# Patient Record
Sex: Male | Born: 1935 | Race: Black or African American | Hispanic: No | Marital: Married | Smoking: Former smoker
Health system: Southern US, Community
[De-identification: ages and names within clinical notes are randomized; demographics above are authoritative.]

## PROBLEM LIST (undated history)

## (undated) DIAGNOSIS — I10 Essential (primary) hypertension: Secondary | ICD-10-CM

## (undated) DIAGNOSIS — E7800 Pure hypercholesterolemia, unspecified: Secondary | ICD-10-CM

## (undated) DIAGNOSIS — I4891 Unspecified atrial fibrillation: Secondary | ICD-10-CM

## (undated) DIAGNOSIS — N400 Enlarged prostate without lower urinary tract symptoms: Secondary | ICD-10-CM

## (undated) DIAGNOSIS — M1990 Unspecified osteoarthritis, unspecified site: Secondary | ICD-10-CM

## (undated) DIAGNOSIS — I350 Nonrheumatic aortic (valve) stenosis: Secondary | ICD-10-CM

## (undated) DIAGNOSIS — I6350 Cerebral infarction due to unspecified occlusion or stenosis of unspecified cerebral artery: Secondary | ICD-10-CM

## (undated) DIAGNOSIS — I509 Heart failure, unspecified: Secondary | ICD-10-CM

## (undated) DIAGNOSIS — I281 Aneurysm of pulmonary artery: Secondary | ICD-10-CM

## (undated) DIAGNOSIS — N183 Chronic kidney disease, stage 3 unspecified: Secondary | ICD-10-CM

## (undated) DIAGNOSIS — M109 Gout, unspecified: Secondary | ICD-10-CM

## (undated) DIAGNOSIS — I272 Pulmonary hypertension, unspecified: Secondary | ICD-10-CM

## (undated) DIAGNOSIS — I359 Nonrheumatic aortic valve disorder, unspecified: Secondary | ICD-10-CM

## (undated) DIAGNOSIS — K649 Unspecified hemorrhoids: Secondary | ICD-10-CM

## (undated) DIAGNOSIS — M159 Polyosteoarthritis, unspecified: Secondary | ICD-10-CM

## (undated) DIAGNOSIS — I639 Cerebral infarction, unspecified: Secondary | ICD-10-CM

## (undated) DIAGNOSIS — N289 Disorder of kidney and ureter, unspecified: Secondary | ICD-10-CM

## (undated) HISTORY — DX: Aneurysm of pulmonary artery: I28.1

## (undated) HISTORY — DX: Pure hypercholesterolemia, unspecified: E78.00

## (undated) HISTORY — DX: Heart failure, unspecified: I50.9

## (undated) HISTORY — DX: Unspecified hemorrhoids: K64.9

## (undated) HISTORY — DX: Unspecified osteoarthritis, unspecified site: M19.90

## (undated) HISTORY — DX: Unspecified atrial fibrillation: I48.91

## (undated) HISTORY — DX: Disorder of kidney and ureter, unspecified: N28.9

## (undated) HISTORY — DX: Polyosteoarthritis, unspecified: M15.9

## (undated) HISTORY — PX: APPENDECTOMY: SHX54

## (undated) HISTORY — DX: Essential (primary) hypertension: I10

## (undated) HISTORY — DX: Cerebral infarction due to unspecified occlusion or stenosis of unspecified cerebral artery: I63.50

## (undated) HISTORY — DX: Cerebral infarction, unspecified: I63.9

## (undated) HISTORY — PX: INGUINAL HERNIA REPAIR: SUR1180

## (undated) HISTORY — PX: CATARACT EXTRACTION: SUR2

## (undated) HISTORY — DX: Benign prostatic hyperplasia without lower urinary tract symptoms: N40.0

## (undated) HISTORY — DX: Nonrheumatic aortic (valve) stenosis: I35.0

## (undated) HISTORY — DX: Pulmonary hypertension, unspecified: I27.20

## (undated) HISTORY — DX: Gout, unspecified: M10.9

## (undated) HISTORY — PX: OTHER SURGICAL HISTORY: SHX169

---

## 1998-01-19 ENCOUNTER — Ambulatory Visit (HOSPITAL_BASED_OUTPATIENT_CLINIC_OR_DEPARTMENT_OTHER): Admission: RE | Admit: 1998-01-19 | Discharge: 1998-01-19 | Payer: Self-pay | Admitting: Surgery

## 1998-06-17 ENCOUNTER — Ambulatory Visit (HOSPITAL_BASED_OUTPATIENT_CLINIC_OR_DEPARTMENT_OTHER): Admission: RE | Admit: 1998-06-17 | Discharge: 1998-06-17 | Payer: Self-pay | Admitting: Urology

## 1999-01-10 ENCOUNTER — Inpatient Hospital Stay (HOSPITAL_COMMUNITY): Admission: EM | Admit: 1999-01-10 | Discharge: 1999-01-13 | Payer: Self-pay | Admitting: Pulmonary Disease

## 1999-01-10 ENCOUNTER — Encounter: Payer: Self-pay | Admitting: Emergency Medicine

## 1999-01-10 ENCOUNTER — Encounter: Payer: Self-pay | Admitting: Pulmonary Disease

## 2000-03-01 ENCOUNTER — Encounter: Payer: Self-pay | Admitting: Cardiothoracic Surgery

## 2000-03-01 ENCOUNTER — Encounter: Admission: RE | Admit: 2000-03-01 | Discharge: 2000-03-01 | Payer: Self-pay | Admitting: Cardiothoracic Surgery

## 2001-02-21 ENCOUNTER — Encounter: Payer: Self-pay | Admitting: Cardiothoracic Surgery

## 2001-02-21 ENCOUNTER — Encounter: Admission: RE | Admit: 2001-02-21 | Discharge: 2001-02-21 | Payer: Self-pay | Admitting: Cardiothoracic Surgery

## 2002-03-06 ENCOUNTER — Encounter: Payer: Self-pay | Admitting: Cardiothoracic Surgery

## 2002-03-06 ENCOUNTER — Encounter: Admission: RE | Admit: 2002-03-06 | Discharge: 2002-03-06 | Payer: Self-pay | Admitting: Cardiothoracic Surgery

## 2002-11-17 ENCOUNTER — Encounter: Payer: Self-pay | Admitting: Cardiology

## 2002-11-17 ENCOUNTER — Encounter: Admission: RE | Admit: 2002-11-17 | Discharge: 2002-11-17 | Payer: Self-pay | Admitting: Cardiology

## 2003-10-25 ENCOUNTER — Inpatient Hospital Stay (HOSPITAL_COMMUNITY): Admission: EM | Admit: 2003-10-25 | Discharge: 2003-10-27 | Payer: Self-pay | Admitting: Emergency Medicine

## 2003-10-26 ENCOUNTER — Encounter: Payer: Self-pay | Admitting: Cardiology

## 2003-12-19 ENCOUNTER — Inpatient Hospital Stay (HOSPITAL_COMMUNITY): Admission: EM | Admit: 2003-12-19 | Discharge: 2003-12-25 | Payer: Self-pay | Admitting: Emergency Medicine

## 2003-12-21 ENCOUNTER — Encounter: Payer: Self-pay | Admitting: Internal Medicine

## 2004-06-10 ENCOUNTER — Ambulatory Visit: Payer: Self-pay | Admitting: Cardiovascular Disease

## 2004-07-14 ENCOUNTER — Ambulatory Visit: Payer: Self-pay | Admitting: Cardiology

## 2004-08-11 ENCOUNTER — Ambulatory Visit: Payer: Self-pay | Admitting: Internal Medicine

## 2004-08-31 ENCOUNTER — Ambulatory Visit: Payer: Self-pay | Admitting: Pulmonary Disease

## 2004-09-08 ENCOUNTER — Ambulatory Visit: Payer: Self-pay | Admitting: Cardiology

## 2004-10-06 ENCOUNTER — Ambulatory Visit: Payer: Self-pay | Admitting: Cardiology

## 2004-10-12 ENCOUNTER — Ambulatory Visit: Payer: Self-pay | Admitting: Pulmonary Disease

## 2004-10-17 ENCOUNTER — Ambulatory Visit: Payer: Self-pay | Admitting: Pulmonary Disease

## 2004-11-03 ENCOUNTER — Ambulatory Visit: Payer: Self-pay | Admitting: Internal Medicine

## 2004-11-15 ENCOUNTER — Ambulatory Visit: Payer: Self-pay | Admitting: Cardiology

## 2004-12-13 ENCOUNTER — Ambulatory Visit: Payer: Self-pay | Admitting: Cardiology

## 2005-01-10 ENCOUNTER — Ambulatory Visit: Payer: Self-pay | Admitting: *Deleted

## 2005-01-31 ENCOUNTER — Ambulatory Visit: Payer: Self-pay | Admitting: Cardiology

## 2005-03-08 ENCOUNTER — Ambulatory Visit: Payer: Self-pay | Admitting: *Deleted

## 2005-04-07 ENCOUNTER — Ambulatory Visit: Payer: Self-pay | Admitting: Cardiology

## 2005-05-05 ENCOUNTER — Ambulatory Visit: Payer: Self-pay | Admitting: Cardiology

## 2005-05-26 ENCOUNTER — Ambulatory Visit: Payer: Self-pay | Admitting: Pulmonary Disease

## 2005-06-06 ENCOUNTER — Ambulatory Visit: Payer: Self-pay | Admitting: Cardiology

## 2005-06-19 ENCOUNTER — Ambulatory Visit: Payer: Self-pay | Admitting: Cardiology

## 2005-06-22 ENCOUNTER — Ambulatory Visit: Payer: Self-pay

## 2005-06-22 ENCOUNTER — Encounter: Payer: Self-pay | Admitting: Cardiology

## 2005-07-04 ENCOUNTER — Ambulatory Visit: Payer: Self-pay | Admitting: *Deleted

## 2005-07-07 ENCOUNTER — Ambulatory Visit: Payer: Self-pay | Admitting: Pulmonary Disease

## 2005-07-19 ENCOUNTER — Ambulatory Visit: Payer: Self-pay | Admitting: Cardiology

## 2005-08-16 ENCOUNTER — Ambulatory Visit: Payer: Self-pay | Admitting: *Deleted

## 2005-09-06 ENCOUNTER — Ambulatory Visit: Payer: Self-pay | Admitting: Cardiology

## 2005-10-06 ENCOUNTER — Ambulatory Visit: Payer: Self-pay | Admitting: Cardiology

## 2005-10-17 ENCOUNTER — Ambulatory Visit: Payer: Self-pay | Admitting: Pulmonary Disease

## 2005-10-20 ENCOUNTER — Ambulatory Visit: Payer: Self-pay | Admitting: Cardiovascular Disease

## 2005-11-03 ENCOUNTER — Ambulatory Visit: Payer: Self-pay | Admitting: Pulmonary Disease

## 2005-11-09 ENCOUNTER — Ambulatory Visit: Payer: Self-pay | Admitting: Pulmonary Disease

## 2005-11-15 ENCOUNTER — Ambulatory Visit: Payer: Self-pay | Admitting: *Deleted

## 2005-12-20 ENCOUNTER — Ambulatory Visit: Payer: Self-pay | Admitting: Cardiology

## 2006-01-25 ENCOUNTER — Ambulatory Visit: Payer: Self-pay | Admitting: Cardiology

## 2006-04-02 ENCOUNTER — Ambulatory Visit: Payer: Self-pay | Admitting: Cardiology

## 2006-05-07 ENCOUNTER — Ambulatory Visit: Payer: Self-pay | Admitting: Pulmonary Disease

## 2006-05-10 ENCOUNTER — Ambulatory Visit: Payer: Self-pay | Admitting: Cardiology

## 2006-08-13 ENCOUNTER — Ambulatory Visit: Payer: Self-pay | Admitting: Cardiology

## 2006-08-27 ENCOUNTER — Encounter: Payer: Self-pay | Admitting: Cardiology

## 2006-08-27 ENCOUNTER — Ambulatory Visit: Payer: Self-pay

## 2006-08-27 ENCOUNTER — Ambulatory Visit: Payer: Self-pay | Admitting: Internal Medicine

## 2006-09-17 ENCOUNTER — Ambulatory Visit: Payer: Self-pay | Admitting: Cardiology

## 2006-10-09 ENCOUNTER — Ambulatory Visit: Payer: Self-pay | Admitting: Cardiology

## 2006-10-30 ENCOUNTER — Ambulatory Visit: Payer: Self-pay | Admitting: Cardiology

## 2006-11-17 ENCOUNTER — Emergency Department (HOSPITAL_COMMUNITY): Admission: EM | Admit: 2006-11-17 | Discharge: 2006-11-17 | Payer: Self-pay | Admitting: Emergency Medicine

## 2006-11-20 ENCOUNTER — Ambulatory Visit: Payer: Self-pay | Admitting: Pulmonary Disease

## 2006-11-20 ENCOUNTER — Ambulatory Visit: Payer: Self-pay | Admitting: *Deleted

## 2006-11-21 ENCOUNTER — Ambulatory Visit: Payer: Self-pay | Admitting: Pulmonary Disease

## 2006-11-21 LAB — CONVERTED CEMR LAB
ALT: 15 units/L (ref 0–40)
AST: 29 units/L (ref 0–37)
Albumin: 3.6 g/dL (ref 3.5–5.2)
Bilirubin, Direct: 0.3 mg/dL (ref 0.0–0.3)
Chloride: 105 meq/L (ref 96–112)
Creatinine, Ser: 1.1 mg/dL (ref 0.4–1.5)
Eosinophils Relative: 3.2 % (ref 0.0–5.0)
Glucose, Bld: 108 mg/dL — ABNORMAL HIGH (ref 70–99)
HCT: 35.4 % — ABNORMAL LOW (ref 39.0–52.0)
Neutrophils Relative %: 72.9 % (ref 43.0–77.0)
Pro B Natriuretic peptide (BNP): 864 pg/mL — ABNORMAL HIGH (ref 0.0–100.0)
RBC: 3.96 M/uL — ABNORMAL LOW (ref 4.22–5.81)
RDW: 16.7 % — ABNORMAL HIGH (ref 11.5–14.6)
Sodium: 145 meq/L (ref 135–145)
Total Bilirubin: 1.2 mg/dL (ref 0.3–1.2)
WBC: 6.2 10*3/uL (ref 4.5–10.5)

## 2006-12-11 ENCOUNTER — Ambulatory Visit: Payer: Self-pay | Admitting: Cardiology

## 2006-12-24 ENCOUNTER — Ambulatory Visit: Payer: Self-pay | Admitting: Pulmonary Disease

## 2007-02-01 ENCOUNTER — Ambulatory Visit: Payer: Self-pay | Admitting: Cardiology

## 2007-02-20 ENCOUNTER — Ambulatory Visit: Payer: Self-pay | Admitting: Internal Medicine

## 2007-03-04 ENCOUNTER — Ambulatory Visit: Payer: Self-pay | Admitting: Pulmonary Disease

## 2007-03-04 LAB — CONVERTED CEMR LAB
Albumin: 3.8 g/dL (ref 3.5–5.2)
Alkaline Phosphatase: 136 units/L — ABNORMAL HIGH (ref 39–117)
BUN: 20 mg/dL (ref 6–23)
Basophils Absolute: 0 10*3/uL (ref 0.0–0.1)
Creatinine, Ser: 1.4 mg/dL (ref 0.4–1.5)
Eosinophils Absolute: 0.2 10*3/uL (ref 0.0–0.6)
GFR calc Af Amer: 63 mL/min
GFR calc non Af Amer: 52 mL/min
Hemoglobin: 11.7 g/dL — ABNORMAL LOW (ref 13.0–17.0)
MCHC: 33.4 g/dL (ref 30.0–36.0)
MCV: 90.2 fL (ref 78.0–100.0)
Monocytes Absolute: 0.6 10*3/uL (ref 0.2–0.7)
Monocytes Relative: 10.5 % (ref 3.0–11.0)
Potassium: 3.8 meq/L (ref 3.5–5.1)
Pro B Natriuretic peptide (BNP): 1017 pg/mL — ABNORMAL HIGH (ref 0.0–100.0)
RDW: 17.2 % — ABNORMAL HIGH (ref 11.5–14.6)
Sodium: 144 meq/L (ref 135–145)
Total Bilirubin: 1.5 mg/dL — ABNORMAL HIGH (ref 0.3–1.2)

## 2007-03-06 ENCOUNTER — Ambulatory Visit: Payer: Self-pay | Admitting: Cardiology

## 2007-03-19 ENCOUNTER — Ambulatory Visit: Payer: Self-pay | Admitting: Cardiology

## 2007-03-28 ENCOUNTER — Ambulatory Visit: Payer: Self-pay | Admitting: Internal Medicine

## 2007-03-28 ENCOUNTER — Ambulatory Visit: Payer: Self-pay | Admitting: Cardiology

## 2007-03-28 LAB — CONVERTED CEMR LAB
BUN: 53 mg/dL — ABNORMAL HIGH (ref 6–23)
CO2: 38 meq/L — ABNORMAL HIGH (ref 19–32)
Calcium: 8.7 mg/dL (ref 8.4–10.5)
Chloride: 94 meq/L — ABNORMAL LOW (ref 96–112)
Creatinine, Ser: 1.7 mg/dL — ABNORMAL HIGH (ref 0.4–1.5)
Pro B Natriuretic peptide (BNP): 1263 pg/mL — ABNORMAL HIGH (ref 0.0–100.0)

## 2007-03-29 ENCOUNTER — Emergency Department (HOSPITAL_COMMUNITY): Admission: EM | Admit: 2007-03-29 | Discharge: 2007-03-29 | Payer: Self-pay | Admitting: Emergency Medicine

## 2007-04-15 ENCOUNTER — Ambulatory Visit: Payer: Self-pay | Admitting: Cardiology

## 2007-04-15 LAB — CONVERTED CEMR LAB
BUN: 26 mg/dL — ABNORMAL HIGH (ref 6–23)
CO2: 33 meq/L — ABNORMAL HIGH (ref 19–32)
Calcium: 8.7 mg/dL (ref 8.4–10.5)
Creatinine, Ser: 1.3 mg/dL (ref 0.4–1.5)

## 2007-04-19 ENCOUNTER — Ambulatory Visit: Payer: Self-pay | Admitting: Internal Medicine

## 2007-04-19 LAB — CONVERTED CEMR LAB
BUN: 18 mg/dL (ref 6–23)
CO2: 30 meq/L (ref 19–32)
Calcium: 8.9 mg/dL (ref 8.4–10.5)
Chloride: 107 meq/L (ref 96–112)
GFR calc Af Amer: 63 mL/min
GFR calc non Af Amer: 52 mL/min
Glucose, Bld: 89 mg/dL (ref 70–99)

## 2007-05-14 ENCOUNTER — Ambulatory Visit: Payer: Self-pay | Admitting: Cardiovascular Disease

## 2007-05-14 ENCOUNTER — Ambulatory Visit: Payer: Self-pay | Admitting: Cardiology

## 2007-06-01 DIAGNOSIS — I10 Essential (primary) hypertension: Secondary | ICD-10-CM | POA: Insufficient documentation

## 2007-06-01 DIAGNOSIS — I509 Heart failure, unspecified: Secondary | ICD-10-CM

## 2007-06-01 DIAGNOSIS — M1990 Unspecified osteoarthritis, unspecified site: Secondary | ICD-10-CM | POA: Insufficient documentation

## 2007-06-01 DIAGNOSIS — N259 Disorder resulting from impaired renal tubular function, unspecified: Secondary | ICD-10-CM | POA: Insufficient documentation

## 2007-06-01 DIAGNOSIS — I6350 Cerebral infarction due to unspecified occlusion or stenosis of unspecified cerebral artery: Secondary | ICD-10-CM | POA: Insufficient documentation

## 2007-06-01 DIAGNOSIS — E7800 Pure hypercholesterolemia, unspecified: Secondary | ICD-10-CM

## 2007-06-04 ENCOUNTER — Ambulatory Visit: Payer: Self-pay | Admitting: Pulmonary Disease

## 2007-06-04 LAB — CONVERTED CEMR LAB
BUN: 24 mg/dL — ABNORMAL HIGH (ref 6–23)
CO2: 28 meq/L (ref 19–32)
Calcium: 9 mg/dL (ref 8.4–10.5)
Chloride: 102 meq/L (ref 96–112)
GFR calc Af Amer: 58 mL/min
GFR calc non Af Amer: 48 mL/min
Glucose, Bld: 96 mg/dL (ref 70–99)

## 2007-07-12 ENCOUNTER — Telehealth (INDEPENDENT_AMBULATORY_CARE_PROVIDER_SITE_OTHER): Payer: Self-pay | Admitting: *Deleted

## 2007-07-16 ENCOUNTER — Ambulatory Visit: Payer: Self-pay | Admitting: Cardiology

## 2007-07-16 LAB — CONVERTED CEMR LAB
BUN: 20 mg/dL (ref 6–23)
Calcium: 8.8 mg/dL (ref 8.4–10.5)
Chloride: 99 meq/L (ref 96–112)
GFR calc Af Amer: 63 mL/min
GFR calc non Af Amer: 52 mL/min
Glucose, Bld: 89 mg/dL (ref 70–99)

## 2007-07-23 ENCOUNTER — Telehealth: Payer: Self-pay | Admitting: Pulmonary Disease

## 2007-07-24 ENCOUNTER — Ambulatory Visit: Payer: Self-pay | Admitting: Pulmonary Disease

## 2007-07-24 DIAGNOSIS — K4090 Unilateral inguinal hernia, without obstruction or gangrene, not specified as recurrent: Secondary | ICD-10-CM | POA: Insufficient documentation

## 2007-07-25 DIAGNOSIS — M109 Gout, unspecified: Secondary | ICD-10-CM

## 2007-07-25 DIAGNOSIS — I359 Nonrheumatic aortic valve disorder, unspecified: Secondary | ICD-10-CM

## 2007-07-25 DIAGNOSIS — Z87898 Personal history of other specified conditions: Secondary | ICD-10-CM | POA: Insufficient documentation

## 2007-07-25 DIAGNOSIS — I4819 Other persistent atrial fibrillation: Secondary | ICD-10-CM

## 2007-07-25 DIAGNOSIS — I281 Aneurysm of pulmonary artery: Secondary | ICD-10-CM

## 2007-07-25 DIAGNOSIS — K649 Unspecified hemorrhoids: Secondary | ICD-10-CM | POA: Insufficient documentation

## 2007-07-29 ENCOUNTER — Telehealth: Payer: Self-pay | Admitting: Pulmonary Disease

## 2007-08-06 ENCOUNTER — Telehealth: Payer: Self-pay | Admitting: Pulmonary Disease

## 2007-08-29 ENCOUNTER — Ambulatory Visit: Payer: Self-pay | Admitting: Cardiology

## 2007-08-29 ENCOUNTER — Encounter: Payer: Self-pay | Admitting: Pulmonary Disease

## 2007-08-29 ENCOUNTER — Ambulatory Visit: Payer: Self-pay | Admitting: Emergency Medicine

## 2007-08-29 ENCOUNTER — Inpatient Hospital Stay (HOSPITAL_COMMUNITY): Admission: AD | Admit: 2007-08-29 | Discharge: 2007-09-04 | Payer: Self-pay | Admitting: Cardiology

## 2007-08-30 ENCOUNTER — Encounter: Payer: Self-pay | Admitting: Cardiology

## 2007-09-13 ENCOUNTER — Telehealth: Payer: Self-pay | Admitting: Pulmonary Disease

## 2007-09-15 ENCOUNTER — Emergency Department (HOSPITAL_COMMUNITY): Admission: EM | Admit: 2007-09-15 | Discharge: 2007-09-15 | Payer: Self-pay | Admitting: Emergency Medicine

## 2007-09-25 ENCOUNTER — Ambulatory Visit: Payer: Self-pay | Admitting: Pulmonary Disease

## 2007-09-25 DIAGNOSIS — I272 Pulmonary hypertension, unspecified: Secondary | ICD-10-CM | POA: Insufficient documentation

## 2007-09-27 ENCOUNTER — Ambulatory Visit: Payer: Self-pay | Admitting: Cardiology

## 2007-10-03 ENCOUNTER — Ambulatory Visit: Payer: Self-pay | Admitting: Pulmonary Disease

## 2007-10-08 ENCOUNTER — Telehealth (INDEPENDENT_AMBULATORY_CARE_PROVIDER_SITE_OTHER): Payer: Self-pay | Admitting: *Deleted

## 2007-10-11 ENCOUNTER — Ambulatory Visit: Payer: Self-pay | Admitting: Cardiology

## 2007-10-25 ENCOUNTER — Ambulatory Visit: Payer: Self-pay | Admitting: Cardiovascular Disease

## 2007-11-04 ENCOUNTER — Ambulatory Visit: Payer: Self-pay | Admitting: Cardiology

## 2007-11-18 ENCOUNTER — Ambulatory Visit: Payer: Self-pay | Admitting: Cardiology

## 2007-12-03 ENCOUNTER — Ambulatory Visit: Payer: Self-pay | Admitting: Cardiology

## 2007-12-24 ENCOUNTER — Ambulatory Visit: Payer: Self-pay | Admitting: Cardiology

## 2007-12-24 ENCOUNTER — Encounter: Payer: Self-pay | Admitting: Pulmonary Disease

## 2007-12-24 LAB — CONVERTED CEMR LAB
CO2: 29 meq/L (ref 19–32)
Chloride: 106 meq/L (ref 96–112)
GFR calc non Af Amer: 48 mL/min
Potassium: 3.7 meq/L (ref 3.5–5.1)
Pro B Natriuretic peptide (BNP): 711 pg/mL — ABNORMAL HIGH (ref 0.0–100.0)

## 2008-01-13 ENCOUNTER — Ambulatory Visit: Payer: Self-pay | Admitting: Cardiology

## 2008-01-13 LAB — CONVERTED CEMR LAB
CO2: 30 meq/L (ref 19–32)
Chloride: 103 meq/L (ref 96–112)
GFR calc non Af Amer: 56 mL/min
Potassium: 3.8 meq/L (ref 3.5–5.1)
Pro B Natriuretic peptide (BNP): 761 pg/mL — ABNORMAL HIGH (ref 0.0–100.0)
Sodium: 141 meq/L (ref 135–145)

## 2008-01-31 ENCOUNTER — Ambulatory Visit: Payer: Self-pay | Admitting: Cardiology

## 2008-03-25 ENCOUNTER — Ambulatory Visit: Payer: Self-pay | Admitting: Cardiology

## 2008-04-29 ENCOUNTER — Ambulatory Visit: Payer: Self-pay | Admitting: Cardiology

## 2008-04-29 LAB — CONVERTED CEMR LAB
CO2: 33 meq/L — ABNORMAL HIGH (ref 19–32)
Chloride: 109 meq/L (ref 96–112)
Creatinine, Ser: 1.3 mg/dL (ref 0.4–1.5)
GFR calc non Af Amer: 56 mL/min
Potassium: 3.8 meq/L (ref 3.5–5.1)
Pro B Natriuretic peptide (BNP): 486 pg/mL — ABNORMAL HIGH (ref 0.0–100.0)
Sodium: 144 meq/L (ref 135–145)

## 2008-05-20 ENCOUNTER — Ambulatory Visit: Payer: Self-pay | Admitting: Pulmonary Disease

## 2008-05-26 ENCOUNTER — Ambulatory Visit: Payer: Self-pay | Admitting: Cardiology

## 2008-05-26 ENCOUNTER — Ambulatory Visit: Payer: Self-pay | Admitting: Internal Medicine

## 2008-06-23 ENCOUNTER — Ambulatory Visit: Payer: Self-pay | Admitting: Cardiovascular Disease

## 2008-07-08 ENCOUNTER — Ambulatory Visit: Payer: Self-pay | Admitting: Pulmonary Disease

## 2008-07-08 ENCOUNTER — Telehealth (INDEPENDENT_AMBULATORY_CARE_PROVIDER_SITE_OTHER): Payer: Self-pay | Admitting: *Deleted

## 2008-07-10 LAB — CONVERTED CEMR LAB
Albumin: 4.1 g/dL (ref 3.5–5.2)
BUN: 20 mg/dL (ref 6–23)
Basophils Absolute: 0 10*3/uL (ref 0.0–0.1)
Basophils Relative: 0 % (ref 0.0–3.0)
Cholesterol: 167 mg/dL (ref 0–200)
Creatinine, Ser: 1.3 mg/dL (ref 0.4–1.5)
Eosinophils Absolute: 0.1 10*3/uL (ref 0.0–0.7)
Eosinophils Relative: 1.7 % (ref 0.0–5.0)
GFR calc Af Amer: 68 mL/min
GFR calc non Af Amer: 56 mL/min
HCT: 46.6 % (ref 39.0–52.0)
HDL: 63.8 mg/dL (ref >39.0)
MCHC: 33.6 g/dL (ref 30.0–36.0)
MCV: 95.8 fL (ref 78.0–100.0)
Monocytes Absolute: 0.6 10*3/uL (ref 0.1–1.0)
Platelets: 230 10*3/uL (ref 150–400)
Potassium: 4 meq/L (ref 3.5–5.1)
Pro B Natriuretic peptide (BNP): 568 pg/mL — ABNORMAL HIGH (ref 0.0–100.0)
TSH: 2.33 microintl units/mL (ref 0.35–5.50)
Uric Acid, Serum: 7.7 mg/dL (ref 4.0–7.8)
WBC: 8.1 10*3/uL (ref 4.5–10.5)

## 2008-08-18 ENCOUNTER — Encounter: Payer: Self-pay | Admitting: Cardiology

## 2008-08-18 ENCOUNTER — Ambulatory Visit: Payer: Self-pay | Admitting: Cardiology

## 2008-08-18 ENCOUNTER — Ambulatory Visit: Payer: Self-pay | Admitting: Cardiovascular Disease

## 2008-09-15 ENCOUNTER — Ambulatory Visit: Payer: Self-pay | Admitting: Cardiology

## 2008-09-15 ENCOUNTER — Ambulatory Visit: Payer: Self-pay

## 2008-10-06 ENCOUNTER — Ambulatory Visit: Payer: Self-pay | Admitting: Cardiology

## 2008-10-06 ENCOUNTER — Encounter: Payer: Self-pay | Admitting: Cardiology

## 2008-10-06 ENCOUNTER — Ambulatory Visit: Payer: Self-pay | Admitting: Internal Medicine

## 2008-10-22 ENCOUNTER — Telehealth: Payer: Self-pay | Admitting: Pulmonary Disease

## 2008-10-26 ENCOUNTER — Telehealth (INDEPENDENT_AMBULATORY_CARE_PROVIDER_SITE_OTHER): Payer: Self-pay | Admitting: *Deleted

## 2009-01-05 ENCOUNTER — Encounter: Payer: Self-pay | Admitting: *Deleted

## 2009-01-12 ENCOUNTER — Ambulatory Visit: Payer: Self-pay | Admitting: Cardiology

## 2009-01-18 ENCOUNTER — Ambulatory Visit: Payer: Self-pay | Admitting: Pulmonary Disease

## 2009-01-24 LAB — CONVERTED CEMR LAB
BUN: 24 mg/dL — ABNORMAL HIGH (ref 6–23)
Calcium: 9 mg/dL (ref 8.4–10.5)
Creatinine, Ser: 1.3 mg/dL (ref 0.4–1.5)
GFR calc non Af Amer: 68.06 mL/min (ref >60)
Glucose, Bld: 91 mg/dL (ref 70–99)

## 2009-02-10 ENCOUNTER — Encounter: Payer: Self-pay | Admitting: *Deleted

## 2009-03-17 ENCOUNTER — Encounter: Payer: Self-pay | Admitting: Cardiology

## 2009-03-24 ENCOUNTER — Encounter (INDEPENDENT_AMBULATORY_CARE_PROVIDER_SITE_OTHER): Payer: Self-pay | Admitting: *Deleted

## 2009-04-01 ENCOUNTER — Ambulatory Visit: Payer: Self-pay | Admitting: Cardiovascular Disease

## 2009-04-29 ENCOUNTER — Ambulatory Visit: Payer: Self-pay | Admitting: Internal Medicine

## 2009-05-27 ENCOUNTER — Ambulatory Visit: Payer: Self-pay | Admitting: Cardiovascular Disease

## 2009-06-08 ENCOUNTER — Telehealth (INDEPENDENT_AMBULATORY_CARE_PROVIDER_SITE_OTHER): Payer: Self-pay | Admitting: *Deleted

## 2009-06-08 ENCOUNTER — Ambulatory Visit: Payer: Self-pay | Admitting: Pulmonary Disease

## 2009-06-15 ENCOUNTER — Encounter: Payer: Self-pay | Admitting: Pulmonary Disease

## 2009-07-05 ENCOUNTER — Ambulatory Visit: Payer: Self-pay | Admitting: Cardiology

## 2009-07-08 ENCOUNTER — Encounter: Payer: Self-pay | Admitting: Adult Health

## 2009-07-08 ENCOUNTER — Ambulatory Visit: Payer: Self-pay | Admitting: Pulmonary Disease

## 2009-07-12 ENCOUNTER — Telehealth (INDEPENDENT_AMBULATORY_CARE_PROVIDER_SITE_OTHER): Payer: Self-pay | Admitting: *Deleted

## 2009-07-12 LAB — CONVERTED CEMR LAB
BUN: 12 mg/dL (ref 6–23)
Chloride: 99 meq/L (ref 96–112)
Eosinophils Relative: 1.1 % (ref 0.0–5.0)
Glucose, Bld: 112 mg/dL — ABNORMAL HIGH (ref 70–99)
HCT: 46.4 % (ref 39.0–52.0)
Ketones, ur: NEGATIVE mg/dL
Lymphs Abs: 1.5 10*3/uL (ref 0.7–4.0)
MCV: 99.3 fL (ref 78.0–100.0)
Monocytes Absolute: 0.5 10*3/uL (ref 0.1–1.0)
Platelets: 225 10*3/uL (ref 150.0–400.0)
Potassium: 3.6 meq/L (ref 3.5–5.1)
RDW: 15 % — ABNORMAL HIGH (ref 11.5–14.6)
Specific Gravity, Urine: 1.01 (ref 1.000–1.030)
TSH: 2.03 microintl units/mL (ref 0.35–5.50)
Total Protein, Urine: NEGATIVE mg/dL
Urine Glucose: NEGATIVE mg/dL
Urobilinogen, UA: 0.2 (ref 0.0–1.0)
WBC: 7.7 10*3/uL (ref 4.5–10.5)
pH: 7 (ref 5.0–8.0)

## 2009-07-13 DIAGNOSIS — R42 Dizziness and giddiness: Secondary | ICD-10-CM

## 2009-07-19 ENCOUNTER — Ambulatory Visit: Payer: Self-pay | Admitting: Pulmonary Disease

## 2009-08-09 ENCOUNTER — Ambulatory Visit: Payer: Self-pay | Admitting: Cardiology

## 2009-08-09 LAB — CONVERTED CEMR LAB: POC INR: 1.8

## 2009-08-13 ENCOUNTER — Telehealth: Payer: Self-pay | Admitting: Cardiology

## 2009-08-25 ENCOUNTER — Ambulatory Visit: Payer: Self-pay | Admitting: Pulmonary Disease

## 2009-09-08 ENCOUNTER — Telehealth: Payer: Self-pay | Admitting: Pulmonary Disease

## 2009-09-08 LAB — CONVERTED CEMR LAB
Albumin: 4 g/dL (ref 3.5–5.2)
Cholesterol: 153 mg/dL (ref 0–200)
HDL: 49.7 mg/dL (ref >39.00)
LDL Cholesterol: 83 mg/dL (ref 0–99)
Pro B Natriuretic peptide (BNP): 395 pg/mL — ABNORMAL HIGH (ref 0.0–100.0)
Total Protein: 7.5 g/dL (ref 6.0–8.3)
Triglycerides: 101 mg/dL (ref 0.0–149.0)
VLDL: 20.2 mg/dL (ref 0.0–40.0)

## 2009-09-10 ENCOUNTER — Ambulatory Visit: Payer: Self-pay | Admitting: Pulmonary Disease

## 2009-09-12 LAB — CONVERTED CEMR LAB: PSA: 1.65 ng/mL (ref 0.10–4.00)

## 2009-09-13 ENCOUNTER — Ambulatory Visit: Payer: Self-pay | Admitting: Cardiovascular Disease

## 2009-09-13 LAB — CONVERTED CEMR LAB: POC INR: 2.2

## 2009-09-17 ENCOUNTER — Telehealth: Payer: Self-pay | Admitting: Internal Medicine

## 2009-09-21 ENCOUNTER — Telehealth (INDEPENDENT_AMBULATORY_CARE_PROVIDER_SITE_OTHER): Payer: Self-pay | Admitting: Pharmacist

## 2009-09-23 ENCOUNTER — Telehealth (INDEPENDENT_AMBULATORY_CARE_PROVIDER_SITE_OTHER): Payer: Self-pay | Admitting: Pharmacist

## 2009-09-30 ENCOUNTER — Encounter: Payer: Self-pay | Admitting: Pulmonary Disease

## 2009-09-30 ENCOUNTER — Telehealth (INDEPENDENT_AMBULATORY_CARE_PROVIDER_SITE_OTHER): Payer: Self-pay | Admitting: Pharmacist

## 2009-10-11 ENCOUNTER — Ambulatory Visit: Payer: Self-pay | Admitting: Internal Medicine

## 2009-10-11 LAB — CONVERTED CEMR LAB
BUN: 20 mg/dL (ref 6–23)
Creatinine, Ser: 1.5 mg/dL (ref 0.4–1.5)
GFR calc non Af Amer: 57.6 mL/min (ref >60)

## 2009-11-09 ENCOUNTER — Telehealth (INDEPENDENT_AMBULATORY_CARE_PROVIDER_SITE_OTHER): Payer: Self-pay | Admitting: *Deleted

## 2009-11-09 ENCOUNTER — Ambulatory Visit: Payer: Self-pay | Admitting: Internal Medicine

## 2009-11-09 LAB — CONVERTED CEMR LAB: POC INR: 2

## 2009-11-10 ENCOUNTER — Telehealth (INDEPENDENT_AMBULATORY_CARE_PROVIDER_SITE_OTHER): Payer: Self-pay | Admitting: *Deleted

## 2009-11-15 ENCOUNTER — Ambulatory Visit: Payer: Self-pay | Admitting: Pulmonary Disease

## 2009-11-15 DIAGNOSIS — N62 Hypertrophy of breast: Secondary | ICD-10-CM | POA: Insufficient documentation

## 2009-11-17 ENCOUNTER — Telehealth (INDEPENDENT_AMBULATORY_CARE_PROVIDER_SITE_OTHER): Payer: Self-pay | Admitting: *Deleted

## 2009-11-17 LAB — CONVERTED CEMR LAB
BUN: 18 mg/dL (ref 6–23)
Calcium: 9.1 mg/dL (ref 8.4–10.5)
Creatinine, Ser: 1.4 mg/dL (ref 0.4–1.5)

## 2009-11-24 ENCOUNTER — Telehealth: Payer: Self-pay | Admitting: Pulmonary Disease

## 2009-12-07 ENCOUNTER — Ambulatory Visit: Payer: Self-pay | Admitting: Cardiology

## 2009-12-07 LAB — CONVERTED CEMR LAB: POC INR: 2.1

## 2009-12-30 ENCOUNTER — Telehealth (INDEPENDENT_AMBULATORY_CARE_PROVIDER_SITE_OTHER): Payer: Self-pay | Admitting: *Deleted

## 2010-01-17 ENCOUNTER — Ambulatory Visit: Payer: Self-pay | Admitting: Pulmonary Disease

## 2010-01-27 ENCOUNTER — Telehealth (INDEPENDENT_AMBULATORY_CARE_PROVIDER_SITE_OTHER): Payer: Self-pay | Admitting: *Deleted

## 2010-02-01 ENCOUNTER — Ambulatory Visit: Payer: Self-pay | Admitting: Cardiology

## 2010-02-01 ENCOUNTER — Ambulatory Visit: Payer: Self-pay | Admitting: Internal Medicine

## 2010-02-01 DIAGNOSIS — I2589 Other forms of chronic ischemic heart disease: Secondary | ICD-10-CM | POA: Insufficient documentation

## 2010-02-01 LAB — CONVERTED CEMR LAB: POC INR: 1.9

## 2010-02-24 ENCOUNTER — Ambulatory Visit: Payer: Self-pay | Admitting: Cardiology

## 2010-03-30 ENCOUNTER — Ambulatory Visit: Payer: Self-pay | Admitting: Internal Medicine

## 2010-03-31 ENCOUNTER — Ambulatory Visit: Payer: Self-pay | Admitting: Cardiology

## 2010-03-31 LAB — CONVERTED CEMR LAB
Albumin: 4 g/dL (ref 3.5–5.2)
Alkaline Phosphatase: 117 units/L (ref 39–117)
Bilirubin, Direct: 0.2 mg/dL (ref 0.0–0.3)
LDL Cholesterol: 102 mg/dL — ABNORMAL HIGH (ref 0–99)
Total CHOL/HDL Ratio: 4
Triglycerides: 80 mg/dL (ref 0.0–149.0)
VLDL: 16 mg/dL (ref 0.0–40.0)

## 2010-04-05 ENCOUNTER — Encounter: Payer: Self-pay | Admitting: Cardiology

## 2010-04-27 ENCOUNTER — Ambulatory Visit: Payer: Self-pay | Admitting: Cardiology

## 2010-06-06 ENCOUNTER — Ambulatory Visit: Payer: Self-pay | Admitting: Cardiology

## 2010-06-06 LAB — CONVERTED CEMR LAB: POC INR: 2.2

## 2010-07-05 ENCOUNTER — Ambulatory Visit: Payer: Self-pay | Admitting: Cardiology

## 2010-07-05 LAB — CONVERTED CEMR LAB: POC INR: 1.8

## 2010-07-11 ENCOUNTER — Ambulatory Visit: Payer: Self-pay | Admitting: Pulmonary Disease

## 2010-07-18 ENCOUNTER — Ambulatory Visit: Payer: Self-pay | Admitting: Pulmonary Disease

## 2010-07-22 LAB — CONVERTED CEMR LAB
Basophils Relative: 0.4 % (ref 0.0–3.0)
CO2: 30 meq/L (ref 19–32)
Calcium: 9.4 mg/dL (ref 8.4–10.5)
Creatinine, Ser: 1.8 mg/dL — ABNORMAL HIGH (ref 0.4–1.5)
Eosinophils Relative: 1.8 % (ref 0.0–5.0)
Hemoglobin: 15.1 g/dL (ref 13.0–17.0)
Lymphocytes Relative: 17.1 % (ref 12.0–46.0)
MCHC: 32.6 g/dL (ref 30.0–36.0)
Monocytes Relative: 7.7 % (ref 3.0–12.0)
Neutro Abs: 6.2 10*3/uL (ref 1.4–7.7)
Neutrophils Relative %: 73 % (ref 43.0–77.0)
Pro B Natriuretic peptide (BNP): 535.3 pg/mL — ABNORMAL HIGH (ref 0.0–100.0)
RBC: 4.65 M/uL (ref 4.22–5.81)
Sodium: 144 meq/L (ref 135–145)
Uric Acid, Serum: 6.4 mg/dL (ref 4.0–7.8)
WBC: 8.5 10*3/uL (ref 4.5–10.5)

## 2010-07-29 ENCOUNTER — Telehealth (INDEPENDENT_AMBULATORY_CARE_PROVIDER_SITE_OTHER): Payer: Self-pay | Admitting: *Deleted

## 2010-08-02 ENCOUNTER — Ambulatory Visit: Payer: Self-pay | Admitting: Internal Medicine

## 2010-08-02 ENCOUNTER — Ambulatory Visit: Payer: Self-pay | Admitting: Cardiology

## 2010-08-02 ENCOUNTER — Encounter: Payer: Self-pay | Admitting: Cardiology

## 2010-08-02 LAB — CONVERTED CEMR LAB: POC INR: 2.3

## 2010-08-09 ENCOUNTER — Telehealth: Payer: Self-pay | Admitting: Cardiology

## 2010-08-24 ENCOUNTER — Telehealth: Payer: Self-pay | Admitting: Pulmonary Disease

## 2010-08-30 ENCOUNTER — Ambulatory Visit: Admission: RE | Admit: 2010-08-30 | Discharge: 2010-08-30 | Payer: Self-pay | Source: Home / Self Care

## 2010-08-30 ENCOUNTER — Ambulatory Visit (HOSPITAL_COMMUNITY)
Admission: RE | Admit: 2010-08-30 | Discharge: 2010-08-30 | Payer: Self-pay | Source: Home / Self Care | Attending: Cardiology | Admitting: Cardiology

## 2010-08-30 LAB — CONVERTED CEMR LAB: POC INR: 2.7

## 2010-09-06 NOTE — Assessment & Plan Note (Signed)
Summary: 12 mon rov 401.1 427.31  pfh      Allergies Added:    Visit Type:  Follow-up Primary Provider:  Dr. Kriste Basque  CC:  CAD/Atrial Fibrillation.  History of Present Illness: The patient presents for followup of the above. Since I last saw him he has had no new cardiovascular complaints. He gets around with a cane and does walking through grocery stores. With this level of activity he denies any chest pressure, neck or arm discomfort. He has some chronic dyspnea but this is unchanged. He has no PND or orthopnea. He has no palpitations, presyncope or syncope. He has minimal lower extremity swelling and no weight gain but unfortunately no weight loss. He occasionally wears oxygen at night if he gets dyspneic. He does have some mild breast tenderness and gynecomastia.  Current Medications (verified): 1)  Lumigan 0.03 % Soln (Bimatoprost) .Marland Kitchen.. 1 Drop Each Eye At Bedtime 2)  Icaps  Caps (Multiple Vitamins-Minerals) .Marland Kitchen.. 1 Two Times A Day 3)  Oxygen .... 2 Liters As Needed 4)  Coumadin 5 Mg Tabs (Warfarin Sodium) .... As Directed 5)  Metoprolol Succinate 100 Mg Xr24h-Tab (Metoprolol Succinate) .... Take 1/2 Tab By Mouth Once Daily.... 6)  Verapamil Hcl Cr 240 Mg  Tbcr (Verapamil Hcl) .... Take 1 Tablet By Mouth Once A Day 7)  Micardis 80 Mg  Tabs (Telmisartan) .... Take 1 Tablet By Mouth Once A Day 8)  Clonidine Hcl 0.2 Mg Tabs (Clonidine Hcl) .... Take One Half By Mouth Two Times A Day 9)  Demadex 20 Mg  Tabs (Torsemide) .... Take 2 Tablets Two Times A Day 10)  Eplerenone 25 Mg Tabs (Eplerenone) .... Take 1  Daily 11)  Klor-Con M20 20 Meq  Tbcr (Potassium Chloride Crys Cr) .... Take 2 Tabs By Mouth Two Times A Day As Directed 12)  Simvastatin 40 Mg Tabs (Simvastatin) .... Take 1 Tab By Mouth At Bedtime... 13)  Flomax 0.4 Mg  Cp24 (Tamsulosin Hcl) .... Take 1 Tab Daily At Bedtime... 14)  Allopurinol 100 Mg  Tabs (Allopurinol) .Marland Kitchen.. 1 Tab By Mouth Once Daily For Gout Prevention... 15)  Vitamin  D 2000 Unit Tabs (Cholecalciferol) .Marland Kitchen.. 1 Every Am 16)  Meclizine Hcl 25 Mg Tabs (Meclizine Hcl) .... 1/2-1 By Mouth Three Times A Day As Needed Dizziness  Allergies (verified): 1)  ! Motrin  Past History:  Past Medical History: Reviewed history from 01/17/2010 and no changes required. PULMONARY HYPERTENSION (ICD-416.8) HYPERTENSION (ICD-401.9) CHF (ICD-428.0) AORTIC STENOSIS (ICD-424.1) ATRIAL FIBRILLATION (ICD-427.31) ANEURYSM OF PULMONARY ARTERY (ICD-417.1) HYPERCHOLESTEROLEMIA (ICD-272.0) HEMORRHOIDS (ICD-455.6) RENAL INSUFFICIENCY (ICD-588.9) BENIGN PROSTATIC HYPERTROPHY, HX OF (ICD-V13.8) CVA (ICD-434.91) DEGENERATIVE JOINT DISEASE, GENERALIZED (ICD-715.00) GOUT (ICD-274.9) INGUINAL HERNIA (ICD-550.90)  Past Surgical History: Reviewed history from 01/17/2010 and no changes required. Appendectomy Inguinal herniorrhaphy - rt side Cosmetic Eye Surgery Cataract Surgery  Review of Systems       As stated in the HPI and negative for all other systems.   Vital Signs:  Patient profile:   77 year old male Height:      71 inches Weight:      210 pounds BMI:     29.39 Pulse rate:   92 / minute Resp:     18 per minute BP sitting:   142 / 82  (right arm)  Vitals Entered By: Marrion Coy, CNA (February 01, 2010 2:53 PM)  Physical Exam  General:  Well developed, well nourished, in no acute distress. Head:  normocephalic and atraumatic Eyes:  PERRLA/EOM intact;  conjunctiva and lids normal. Mouth:  Teeth, gums and palate normal. Oral mucosa normal. Neck:  Neck supple, no JVD. No masses, thyromegaly or abnormal cervical nodes. Chest Wall:  Normal Lungs:  Clear bilaterally to auscultation and percussion. Abdomen:  Bowel sounds positive; abdomen soft and non-tender without masses, organomegaly, or hernias noted. No hepatosplenomegaly. Msk:  Back normal, normal gait. Muscle strength and tone normal. Extremities:  Mild ankle edema Neurologic:  Alert and oriented x 3. Skin:   Intact without lesions or rashes. Cervical Nodes:  no significant adenopathy Inguinal Nodes:  no significant adenopathy Psych:  Normal affect.   Detailed Cardiovascular Exam  Neck    Carotids: Carotids full and equal bilaterally without bruits.      Neck Veins: Normal, no JVD.    Heart    Inspection: no deformities or lifts noted.      Palpation: normal PMI with no thrills palpable.      Auscultation: 3/6 systolic murmur radiating out the outflow tract, mid peaking, s1 and s2 WNL, No S3, no diastolic murmur, irregular  Vascular    Abdominal Aorta: no palpable masses, pulsations, or audible bruits.      Femoral Pulses: normal femoral pulses bilaterally.      Pedal Pulses: normal pedal pulses bilaterally.      Radial Pulses: normal radial pulses bilaterally.      Peripheral Circulation: no clubbing, cyanosis, with normal capillary refill.     EKG  Procedure date:  02/01/2010  Findings:      Atrial fibrillation, rate 92, left axis deviation, lateral T-wave inversion, no significant change from previous  Impression & Recommendations:  Problem # 1:  ATRIAL FIBRILLATION (ICD-427.31) The patient tolerates his rhythm and Coumadin. No change in therapy is indicated.  Problem # 2:  HYPERTENSION (ICD-401.9) The patient has good blood pressure control finally. We discussed the fact that gynecomastia was probably related to Aldactone which he was taking.Marland Kitchen  He will currently remain on the meds as listed.  Problem # 3:  AORTIC STENOSIS (ICD-424.1) His systolic murmur seems to be unchanged and I would not suggest severe stenosis. No further testing is indicated.  Problem # 4:  HYPERCHOLESTEROLEMIA (ICD-272.0) With new FDA warnings the patient needs to be taken off of simvastatin. Lipitor 20 mg will be started. He will get a lipid and liver profile in 8 weeks.  Patient Instructions: 1)  Your physician recommends that you return for a FASTING lipid profile & Liver in 8 weeks 2)  Your  physician recommends that you schedule a follow-up appointment in: 12 mths 3)  Your physician has recommended you make the following change in your medication: discontinue Simvastatin and start Lipitor 20mg s  Appended Document: 12 mon rov 401.1 427.31  pfh    Clinical Lists Changes  Medications: Changed medication from SIMVASTATIN 40 MG TABS (SIMVASTATIN) take 1 tab by mouth at bedtime... to LIPITOR 20 MG TABS (ATORVASTATIN CALCIUM) Take one tablet by mouth daily. - Signed Rx of LIPITOR 20 MG TABS (ATORVASTATIN CALCIUM) Take one tablet by mouth daily.;  #30 x 6;  Signed;  Entered by: Hardin Negus, RMA;  Authorized by: Rollene Rotunda, MD, [facility];  Method used: Electronically to [facility] [id number] [address] [place], [place]  [address], Ph: [phone], Fax: [phone]    Prescriptions: LIPITOR 20 MG TABS (ATORVASTATIN CALCIUM) Take one tablet by mouth daily.  #30 x 6   Entered by:   Hardin Negus, RMA   Authorized by:   Rollene Rotunda,  MD, [facility]   Signed by:   Hardin Negus, RMA on 02/01/2010   Method used:   Electronically to        [facility] [id number] (retail)       [address] [place], [place]  [address]       Ph: [phone]       Fax: [phone]   RxID:   [id number]

## 2010-09-06 NOTE — Medication Information (Signed)
Summary: rov/sp  Anticoagulant Therapy  Managed by: Weston Brass, PharmD Referring MD: Rollene Rotunda MD PCP: Dr. [name] MD: Johney Frame MD, Fayrene Fearing Indication 1: Atrial Fibrillation (ICD-427.31) Lab Used: [facility] Site: [facility] INR POC 2.2 INR RANGE 2 - 3  Dietary changes: no    Health status changes: yes       Details: occasional dizziness and SOB w/exertion, but both go away quickly  Bleeding/hemorrhagic complications: no    Recent/future hospitalizations: no    Any changes in medication regimen? no    Recent/future dental: no  Any missed doses?: no       Is patient compliant with meds? yes       Allergies: 1)  ! Motrin  Anticoagulation Management History:      The patient is taking warfarin and comes in today for a routine follow up visit.  Positive risk factors for bleeding include an age of 105 years or older and history of CVA/TIA.  The bleeding index is 'intermediate risk'.  Positive CHADS2 values include History of CHF, History of HTN, Age > 53 years old, and Prior Stroke/CVA/TIA.  The start date was 10/02/2003.  Anticoagulation responsible provider: Allred MD, Fayrene Fearing.  INR POC: 2.2.  Cuvette Lot#: [id number].  Exp: 06/2011.    Anticoagulation Management Assessment/Plan:      The patient's current anticoagulation dose is Coumadin 5 mg tabs: as directed.  The target INR is 2 - 3.  The next INR is due 05/25/2010.  Anticoagulation instructions were given to patient.  Results were reviewed/authorized by Weston Brass, PharmD.  He was notified by Kennieth Francois.         Prior Anticoagulation Instructions: INR 2.5  Continue same dose of 1 tablet every day except 1/2 tablet on Tuesday and Thursday.  Recheck INR in 4 weeks.   Current Anticoagulation Instructions: INR 2.2  Continue taking one tablet every day except for one-half tablet on Tuesday and Thursday.  Recheck in four weeks.

## 2010-09-06 NOTE — Medication Information (Signed)
Summary: [name] [name]  Anticoagulant Therapy  Managed by: Elaina Pattee, PharmD Referring MD: Rollene Rotunda MD PCP: Dr. [name] MD: Graciela Husbands MD, Viviann Spare Indication 1: Atrial Fibrillation (ICD-427.31) Lab Used: [facility] Site: [facility] INR POC 2.0 INR RANGE 2 - 3  Dietary changes: no    Health status changes: no    Bleeding/hemorrhagic complications: no    Recent/future hospitalizations: no    Any changes in medication regimen? no    Recent/future dental: no  Any missed doses?: no       Is patient compliant with meds? yes       Allergies: 1)  ! Motrin  Anticoagulation Management History:      The patient is taking warfarin and comes in today for a routine follow up visit.  Positive risk factors for bleeding include an age of 75 years or older and history of CVA/TIA.  The bleeding index is 'intermediate risk'.  Positive CHADS2 values include History of CHF, History of HTN, Age > 6 years old, and Prior Stroke/CVA/TIA.  The start date was 10/02/2003.  Anticoagulation responsible provider: Graciela Husbands MD, Viviann Spare.  INR POC: 2.0.  Cuvette Lot#: [id number].  Exp: 12/2010.    Anticoagulation Management Assessment/Plan:      The patient's current anticoagulation dose is Coumadin 5 mg tabs: as directed.  The target INR is 2 - 3.  The next INR is due 12/07/2009.  Anticoagulation instructions were given to patient.  Results were reviewed/authorized by Elaina Pattee, PharmD.  He was notified by Elaina Pattee, PharmD.         Prior Anticoagulation Instructions: INR 2.1  Continue on same dosage 1 tablet daily except 0.5 tablet on Tuesdays and Thursdays.  Recheck in 4 weeks.    Current Anticoagulation Instructions: INR 2.0. Take 1 tablet daily except 0.5 tablet on Tues and Thurs.

## 2010-09-06 NOTE — Medication Information (Signed)
Summary: rov/eac  Anticoagulant Therapy  Managed by: Cloyde Reams, RN, BSN Referring MD: Rollene Rotunda MD PCP: Dr. [name] MD: Ladona Ridgel MD, Sharlot Gowda Indication 1: Atrial Fibrillation (ICD-427.31) Lab Used: [facility] Site: [facility] INR POC 2.1 INR RANGE 2 - 3  Dietary changes: no    Health status changes: no    Bleeding/hemorrhagic complications: no    Recent/future hospitalizations: no    Any changes in medication regimen? yes       Details: Eplerenone 25mg  started  Recent/future dental: no  Any missed doses?: no       Is patient compliant with meds? yes      Comments: BMP per Lanora Manis today to check his potassium.  Allergies (verified): 1)  ! Motrin  Anticoagulation Management History:      The patient is taking warfarin and comes in today for a routine follow up visit.  Positive risk factors for bleeding include an age of 75 years or older and history of CVA/TIA.  The bleeding index is 'intermediate risk'.  Positive CHADS2 values include History of CHF, History of HTN, Age > 59 years old, and Prior Stroke/CVA/TIA.  The start date was 10/02/2003.  Anticoagulation responsible provider: Ladona Ridgel MD, Sharlot Gowda.  INR POC: 2.1.  Cuvette Lot#: [id number].  Exp: 12/2010.    Anticoagulation Management Assessment/Plan:      The patient's current anticoagulation dose is Coumadin 5 mg tabs: as directed.  The target INR is 2 - 3.  The next INR is due 11/08/2009.  Anticoagulation instructions were given to patient.  Results were reviewed/authorized by Cloyde Reams, RN, BSN.  He was notified by Cloyde Reams RN.         Prior Anticoagulation Instructions: INR 2.2  Continue same dosing schedule.  Take 1/2 tablet on Tuesday and Thursday, and take 1 tablet all other days. Please stop taking Spironolactone for a few days, soreness/tenderness should begin to resolve within 48 hours. Return to clinic in 4 weeks.  Current Anticoagulation Instructions: INR 2.1  Continue on same  dosage 1 tablet daily except 0.5 tablet on Tuesdays and Thursdays.  Recheck in 4 weeks.

## 2010-09-06 NOTE — Progress Notes (Signed)
Summary: labs  Phone Note Call from Patient   Caller: Patient Call For: nadel Summary of Call: Pt was calling for lab results. I gave him results per append. Pt states he was supposed to have PSA checked as well, but this does not look like it was ordered, and I did not see any mention of this in last OV note. Please advise if okay to have pt come in for PSA check. Thanks. Initial call taken by: Carron Curie CMA,  September 08, 2009 11:37 AM  Follow-up for Phone Call        YES THIS IS WHY I CALLED AND LEFT A MESSAGE FOR PT TO RETURN MY CALL YESTERDAY TO SET UP AN APPT WITH LAB TO COME IN FOR PSA--V76.44---if you could let pt know to come in for this test...thanks---its not been put in idx either.  Randell Loop CMA  September 08, 2009 11:51 AM   pt advised. lab placed.Carron Curie CMA  September 08, 2009 11:56 AM

## 2010-09-06 NOTE — Progress Notes (Signed)
----   Converted from flag ---- ---- 11/07/2009 10:01 PM, Shelby Dubin PharmD, BCPS, CPP wrote: Hi!  Can whoever sees [name] [name] tomorrow verify that he stopped eplerenone and is back on spironolactone and update the med list accordingly?  Lanora Manis found that he complained of breast tenderness a few weeks ago, but he said that he didn't notice a difference with one agent over the other.  If he IS back on spironolactone, please get a BMP today (with visit), per spironolactone monitoring guidelines.  Thanks--call with questions.  [name] [name] ------------------------------       Additional Follow-up for Phone Call Additional follow up Details #2::    [name] [name] states his MD reduced the dose of spironolactone, and he does not think he ever started eplerenone.  His breast tenderness has not decreased much, so he is going to follow-up with Dr. Kriste Basque.  He will call us back tomorrow and let us know, and he understands he will need follow-up labs if either drug continues. Follow-up by: [name] [name], PharmD

## 2010-09-06 NOTE — Medication Information (Signed)
Summary: rov/kmw  Anticoagulant Therapy  Managed by: Cloyde Reams, RN, BSN Referring MD: Rollene Rotunda MD PCP: Dr. [name] MD: Riley Kill MD, Maisie Fus Indication 1: Atrial Fibrillation (ICD-427.31) Lab Used: [facility] Site: [facility] INR POC 1.8 INR RANGE 2 - 3  Dietary changes: no    Health status changes: no    Bleeding/hemorrhagic complications: no    Recent/future hospitalizations: no    Any changes in medication regimen? no    Recent/future dental: no  Any missed doses?: no       Is patient compliant with meds? yes       Allergies (verified): 1)  ! Motrin  Anticoagulation Management History:      The patient is taking warfarin and comes in today for a routine follow up visit.  Positive risk factors for bleeding include an age of 27 years or older and history of CVA/TIA.  The bleeding index is 'intermediate risk'.  Positive CHADS2 values include History of CHF, History of HTN, Age > 2 years old, and Prior Stroke/CVA/TIA.  The start date was 10/02/2003.  Anticoagulation responsible provider: Riley Kill MD, Maisie Fus.  INR POC: 1.8.  Cuvette Lot#: [id number].  Exp: 09/2010.    Anticoagulation Management Assessment/Plan:      The patient's current anticoagulation dose is Coumadin 5 mg tabs: as directed.  The target INR is 2 - 3.  The next INR is due 09/06/2009.  Anticoagulation instructions were given to patient.  Results were reviewed/authorized by Cloyde Reams, RN, BSN.  He was notified by Cloyde Reams RN.         Prior Anticoagulation Instructions: INR 2.0  Continue to take 1 tablet every day except on Tuesday and Thursday take 1/2 tablet. Recheck INR in 4 weeks.  Current Anticoagulation Instructions: INR 1.8  Take 1 tablet tomorrow, then resume same dosage 1 tablet daily except 1/2 tablet on Tuesdays and Thursdays.  Recheck in 3-4 weeks.

## 2010-09-06 NOTE — Progress Notes (Signed)
Summary: prescript  Phone Note Call from Patient Call back at [phone]   Caller: Patient Call For: nadel Summary of Call: need new prescript written for eplerenone 25mg  for pt to take a whole pill instead of half Initial call taken by: Rickard Patience,  Dec 30, 2009 12:47 PM  Follow-up for Phone Call        this med was increased to 1 once daily on 4/11 by tammy parrett--I sent  new rx to pharmacy for 1 once daily #30 and told pt to keep f/u with sn on 6/13 Follow-up by: Philipp Deputy CMA,  Dec 30, 2009 2:53 PM

## 2010-09-06 NOTE — Medication Information (Signed)
Summary: [name] [name]  Anticoagulant Therapy  Managed by: Bethena Midget, RN, BSN Referring MD: Rollene Rotunda MD PCP: Dr. [name] MD: Myrtis Ser MD, Tinnie Gens Indication 1: Atrial Fibrillation (ICD-427.31) Lab Used: [facility] Site: [facility] INR POC 1.8 INR RANGE 2 - 3  Dietary changes: no    Health status changes: no    Bleeding/hemorrhagic complications: no    Recent/future hospitalizations: no    Any changes in medication regimen? no    Recent/future dental: no  Any missed doses?: yes     Details: Pt thinks he probably missed a dose  Is patient compliant with meds? yes       Allergies: 1)  ! Motrin  Anticoagulation Management History:      The patient is taking warfarin and comes in today for a routine follow up visit.  Positive risk factors for bleeding include an age of 75 years or older and history of CVA/TIA.  The bleeding index is 'intermediate risk'.  Positive CHADS2 values include History of CHF, History of HTN, Age > 82 years old, and Prior Stroke/CVA/TIA.  The start date was 10/02/2003.  Anticoagulation responsible provider: Myrtis Ser MD, Tinnie Gens.  INR POC: 1.8.  Cuvette Lot#: [id number].  Exp: 07/2011.    Anticoagulation Management Assessment/Plan:      The patient's current anticoagulation dose is Coumadin 5 mg tabs: as directed.  The target INR is 2 - 3.  The next INR is due 08/02/2010.  Anticoagulation instructions were given to patient.  Results were reviewed/authorized by Bethena Midget, RN, BSN.  He was notified by Bethena Midget, RN, BSN.         Prior Anticoagulation Instructions: INR 2.2  Continue taking one tablet (5mg ) every day except for 1/2 tablet (2.5 mg) on Tuesday and Thursday. Recheck in 4 weeks.  Current Anticoagulation Instructions: INR 1.8 Today take extra 1/2 pill then resume 1pill everyday except 1/2 pill on Tuesdays and Thursdays. Recheck in 4 weeks.

## 2010-09-06 NOTE — Progress Notes (Signed)
  Phone Note Outgoing Call   Call placed by: Eda Keys, PharmD Call placed to: Patient Summary of Call: Follow-up call placed to [name] [name] regarding holding spironolactone.  He has now been holding spironolactone since this past friday (09/17/09).  Breast tenderness/soreness has not resolved.  I have instructed patient to reinitiate spironolactone.  The patient wishes to hold spironolactone for a couple more days.  I have told patient to not hold longer than two additional days because of potential for altered potassium levels.  Patient is to start back on spironolactone and call clinic if breast tenderness worsens.   Initial call taken by: Eda Keys, PharmD Resident

## 2010-09-06 NOTE — Progress Notes (Signed)
Summary: spironolactone f/up  Phone Note Outgoing Call   Call placed by: Eda Keys, PharmD Call placed to: Patient Summary of Call: Lanora Manis called to pt on 2/9 to discuss how pt was doing s/p d/c of spironolactone due to gynecomastia pain.  She LMOM for pt to return call to [place].  (2 calls to home # / 1 call to cell #) Initial call taken by: Shelby Dubin PharmD, BCPS, CPP,  September 17, 2009 3:08 PM  Follow-up for Phone Call        Pt returned call to [place] on 2/11 at 1509.  He was unable to start holding spironolactone until today's dose.  He will hold over weekend and call us back on Monday.   Follow-up by: Shelby Dubin PharmD, BCPS, CPP,  September 17, 2009 3:09 PM

## 2010-09-06 NOTE — Progress Notes (Signed)
  Phone Note Outgoing Call   Call placed by: Eda Keys, PharmD Call placed to: Patient Summary of Call: Call placed to Mr. Cansler regarding breast tenderness.  Patient has held spironolactone since Friday.  He reports soreness/tenderness persists, with relief only at times.  He cannot tell that there has been a significant improvement.  I have instructed patient to continue holding, and I will follow-up with him Thursday afternoon.  If tenderness has not resolved at that time, then we will re-initiate spironolactone.    Initial call taken by: Eda Keys, Ilda Basset D

## 2010-09-06 NOTE — Progress Notes (Signed)
  Phone Note Call from Patient   Caller: Patient Summary of Call: Pt called this am stating he has continued to hold spironolactone and has noticed breast tenderness/soreness resolving.  It has not completely resolvd, but is getting better.  Have informed Dr. Kriste Basque, his PCP, of these symptoms and he has ok'd a med change from spironolactone to eplerenone.  I will order a prescription for eplerenone 25 mg po daily and send to his pharmacy.  [name] [name] is due to return to coumadin clinic on Monday March 7th at which time we will get a BMET to assess K level.   Initial call taken by: Eda Keys, PharmD

## 2010-09-06 NOTE — Progress Notes (Signed)
Summary: return call  Phone Note Call from Patient Call back at Home Phone [phone] Call back at [phone]   Caller: Patient Call For: tammy p Reason for Call: Talk to Nurse Summary of Call: pt returning call to Boone Master Initial call taken by: Eugene Gavia,  November 17, 2009 8:25 AM  Follow-up for Phone Call        spoke with patient.  advised of lab results as stated by TP.  pt verbalized his understanding. Follow-up by: Boone Master CNA,  November 17, 2009 10:36 AM

## 2010-09-06 NOTE — Assessment & Plan Note (Signed)
Summary: discuss breast tenderness on  fluid med/LC   Primary Provider/Referring Provider:  Dr. Kriste Basque  CC:  Acute visit.  Pt c/o tenderness in breast area x 2-3 wks.  He relates this to starting on eplerenone 25 mg.  States that he cut his dose in half and this seemed to help some.Marland Kitchen  History of Present Illness: 77 y/o BM with known history of CHF, HTN  6/10--here for a follow up visit... he has multiple medical problems as noted below...  he is followed regularly by DrHochrein and [facility]- last seen 6/10 and their notes are reviewed...CLONIDINE added 0.2mg - 1/2 Bid for BP... he states that he is doing well- walking some on a daily basis, no recent increase in swelling, denies cough/ phlegm, change in dyspnea, etc... his goal weight is 185# (this was his weight on discharge from hosp 1/09- weight here today= 208# (he states 199# at home).  July 08, 2009--Presents for a work in visit. Complains of dizzy/lightheadedness for 2 days. Worse with movement, turning,Some better this am but feels light headed. no visual/speech changes, chest pain, dyspnea,   ~  July 19, 2009:  good 6 mo- one episode dizziness- likely labyrinthitis Rx'd Antivert & improved... daughter states taking meds properly w/ her help, BP 124/78, no change in CHF, remains on Coumadin in [facility], etc... they wonder if we could decr the KCl from 6 pills/d & we discussed adding Spironolactone 25mg  & decr K20 to 4/d w/ f/u labs in 1 month...  November 15, 2009--Presents for Acute visit.  Pt c/o tenderness in breast area x 2-3 months .  He relates this to starting on eplerenone 25 mg.  States that he cut his dose in half and this seemed to help some. Was felt that aldactone might be cuase of this. Coumadin clinic recommended that he decrease dose in 1/2 to see if this helped. ,only minimal improvement. Has tenderness along nipple w/ noted puffiness in breast area. It is not bad, "I can live with it" , no weight loss, hard masses felt.  Denies chest pain, dyspnea, orthopnea, hemoptysis, fever, n/v/d, edema, headache. Labs from 1 month ago reviewed w/ nml K+ and stable kidney fxn w/ scr 1.5 .    77 y/o BM here for a follow up visit... he has multiple medical problems as noted below...  he is followed regularly by DrHochrein and the [facility]- last seen 6/10 and their notes are reviewed...CLONIDINE added 0.2mg - 1/2 Bid for BP... he states that he is doing well- walking some on a daily basis, no recent increase in swelling, denies cough/ phlegm, change in dyspnea, etc... his goal weight is 185# (this was his weight on discharge from hosp 1/09- weight here today= 208# (he states 199# at home).    Current Problem List:      Preventive Screening-Counseling & Management  Alcohol-Tobacco     Smoking Status: quit  Current Medications (verified): 1)  Oxygen .... 2 Liters As Needed 2)  Coumadin 5 Mg Tabs (Warfarin Sodium) .... As Directed 3)  Metoprolol Succinate 100 Mg Xr24h-Tab (Metoprolol Succinate) .... Take 1/2 Tab By Mouth Once Daily.... 4)  Verapamil Hcl Cr 240 Mg  Tbcr (Verapamil Hcl) .... Take 1 Tablet By Mouth Once A Day 5)  Micardis 80 Mg  Tabs (Telmisartan) .... Take 1 Tablet By Mouth Once A Day 6)  Clonidine Hcl 0.2 Mg Tabs (Clonidine Hcl) .... Take One Half By Mouth Two Times A Day 7)  Demadex 20 Mg  Tabs (Torsemide) .... Take 2 Tablets Two Times A Day 8)  Klor-Con M20 20 Meq  Tbcr (Potassium Chloride Crys Cr) .... Take 2 Tabs By Mouth Two Times A Day As Directed 9)  Simvastatin 40 Mg Tabs (Simvastatin) .... Take 1 Tab By Mouth At Bedtime... 10)  Flomax 0.4 Mg  Cp24 (Tamsulosin Hcl) .... Take 1 Tab Daily At Bedtime... 11)  Allopurinol 100 Mg  Tabs (Allopurinol) .Marland Kitchen.. 1 Tab By Mouth Once Daily For Gout Prevention... 12)  Meclizine Hcl 25 Mg Tabs (Meclizine Hcl) .... 1/2-1 By Mouth Three Times A Day As Needed Dizziness 13)  Eplerenone 25 Mg Tabs (Eplerenone) .... 1/2 Once Daily 14)  Vitamin D 2000 Unit Tabs  (Cholecalciferol) .Marland Kitchen.. 1 Every Am 15)  Icaps  Caps (Multiple Vitamins-Minerals) .Marland Kitchen.. 1 Two Times A Day 16)  Lumigan 0.03 % Soln (Bimatoprost) .Marland Kitchen.. 1 Drop Each Eye At Bedtime  Allergies (verified): 1)  ! Motrin  Past History:  Past Medical History: Last updated: 07/19/2009  PULMONARY HYPERTENSION (ICD-416.8) HYPERTENSION (ICD-401.9) CHF (ICD-428.0) AORTIC STENOSIS (ICD-424.1) ATRIAL FIBRILLATION (ICD-427.31) ANEURYSM OF PULMONARY ARTERY (ICD-417.1) HYPERCHOLESTEROLEMIA (ICD-272.0) HEMORRHOIDS (ICD-455.6) RENAL INSUFFICIENCY (ICD-588.9) BENIGN PROSTATIC HYPERTROPHY, HX OF (ICD-V13.8) CVA (ICD-434.91) DEGENERATIVE JOINT DISEASE, GENERALIZED (ICD-715.00) GOUT (ICD-274.9) INGUINAL HERNIA (ICD-550.90)  Past Surgical History: Last updated: 07/19/2009 Appendectomy Inguinal herniorrhaphy - rt side Cosmetic Eye Surgery Cataract Surgery  Family History: Last updated: 06/17/2008 Noncontributory for Early CAD  Social History: Last updated: 06/17/2008 Retired from [employer] Married No Tobacco except occasional cigars  Review of Systems      See HPI  Vital Signs:  Patient profile:   77 year old male Weight:      209 pounds O2 Sat:      92 % on Room air Temp:     97.2 degrees F oral Pulse rate:   58 / minute BP sitting:   130 / 82  (left arm)  Vitals Entered By: Vernie Murders (November 15, 2009 3:02 PM)  O2 Flow:  Room air CC: Acute visit.  Pt c/o tenderness in breast area x 2-3 wks.  He relates this to starting on eplerenone 25 mg.  States that he cut his dose in half and this seemed to help some. Is Patient Diabetic? No   Physical Exam  Additional Exam:  WD, WN, 77 y/o BM in NAD... GENERAL:  Alert & oriented; pleasant & cooperative... HEENT:  [place]/AT, EOM-wnl, PERRLA, EACs-clear, TMs-wnl, NOSE-clear, THROAT-clear & wnl. NECK:  Supple w/ fairROM; no JVD; sl decr carotid impulses w/ transmit murmur; no thyromegaly or nodules palpated; no lymphadenopathy. CHEST:   Clear to P & A; without wheezes/ rales/ or rhonchi heard... Breast. exam w/ no palpable masses noted, slight enalrged breast tissue c/w gynecomastia, no dippling noted. tender along nipple/aerola HEART:  sl irreg, gr 2/6 AS murmur at base, without rubs or gallops apprec... ABDOMEN:  Soft & nontender; normal bowel sounds; no organomegaly or masses detected. EXT: without deformities, mild arthritic changes; no varicose veins/ +venous insuffic/ tr edema.  DERM:  No lesions noted; no rash etc...     Impression & Recommendations:  Problem # 1:  GYNECOMASTIA (ICD-611.1)  2/2 to Aldactone. We discussed other otptions, he feels that this is not this bad and he can tolerate, will  cont for now and monitor will check bmet today.   Orders: Est. Patient Level IV (08657)  Problem # 2:  HYPERTENSION (ICD-401.9)  controlled on rx  His updated medication list for this problem includes:  Metoprolol Succinate 100 Mg Xr24h-tab (Metoprolol succinate) .Marland Kitchen... Take 1/2 tab by mouth once daily....    Verapamil Hcl Cr 240 Mg Tbcr (Verapamil hcl) .Marland Kitchen... Take 1 tablet by mouth once a day    Micardis 80 Mg Tabs (Telmisartan) .Marland Kitchen... Take 1 tablet by mouth once a day    Clonidine Hcl 0.2 Mg Tabs (Clonidine hcl) .Marland Kitchen... Take one half by mouth two times a day    Demadex 20 Mg Tabs (Torsemide) .Marland Kitchen... Take 2 tablets two times a day    Eplerenone 25 Mg Tabs (Eplerenone) .Marland Kitchen... 1/2 once daily  BP today: 130/82 Prior BP: 124/78 (07/19/2009)  Labs Reviewed: K+: 3.7 (10/11/2009) Creat: : 1.5 (10/11/2009)   Chol: 153 (08/25/2009)   HDL: 49.70 (08/25/2009)   LDL: 83 (08/25/2009)   TG: 101.0 (08/25/2009)  Orders: Est. Patient Level IV (75643)  Medications Added to Medication List This Visit: 1)  Klor-con M20 20 Meq Tbcr (Potassium chloride crys cr) .... Take 2 tabs by mouth two times a day as directed 2)  Eplerenone 25 Mg Tabs (Eplerenone) .... 1/2 once daily 3)  Vitamin D 2000 Unit Tabs (Cholecalciferol) .Marland Kitchen.. 1 every  am 4)  Icaps Caps (Multiple vitamins-minerals) .Marland Kitchen.. 1 two times a day 5)  Lumigan 0.03 % Soln (Bimatoprost) .Marland Kitchen.. 1 drop each eye at bedtime  Complete Medication List: 1)  Oxygen  .... 2 liters as needed 2)  Coumadin 5 Mg Tabs (Warfarin sodium) .... As directed 3)  Metoprolol Succinate 100 Mg Xr24h-tab (Metoprolol succinate) .... Take 1/2 tab by mouth once daily.... 4)  Verapamil Hcl Cr 240 Mg Tbcr (Verapamil hcl) .... Take 1 tablet by mouth once a day 5)  Micardis 80 Mg Tabs (Telmisartan) .... Take 1 tablet by mouth once a day 6)  Clonidine Hcl 0.2 Mg Tabs (Clonidine hcl) .... Take one half by mouth two times a day 7)  Demadex 20 Mg Tabs (Torsemide) .... Take 2 tablets two times a day 8)  Klor-con M20 20 Meq Tbcr (Potassium chloride crys cr) .... Take 2 tabs by mouth two times a day as directed 9)  Simvastatin 40 Mg Tabs (Simvastatin) .... Take 1 tab by mouth at bedtime... 10)  Flomax 0.4 Mg Cp24 (Tamsulosin hcl) .... Take 1 tab daily at bedtime... 11)  Allopurinol 100 Mg Tabs (Allopurinol) .Marland Kitchen.. 1 tab by mouth once daily for gout prevention... 12)  Meclizine Hcl 25 Mg Tabs (Meclizine hcl) .... 1/2-1 by mouth three times a day as needed dizziness 13)  Eplerenone 25 Mg Tabs (Eplerenone) .... 1/2 once daily 14)  Vitamin D 2000 Unit Tabs (Cholecalciferol) .Marland Kitchen.. 1 every am 15)  Icaps Caps (Multiple vitamins-minerals) .Marland Kitchen.. 1 two times a day 16)  Lumigan 0.03 % Soln (Bimatoprost) .Marland Kitchen.. 1 drop each eye at bedtime  Other Orders: TLB-BMP (Basic Metabolic Panel-BMET) (80048-METABOL)  Patient Instructions: 1)  Return back to Spironolactone (Eplerenone) 25mg  1 by mouth once daily  2)  I will call with lab resutls.  3)  Please contact office for sooner follow up if symptoms do not improve or worsen  4)  follow up Dr. Kriste Basque as scheduled and as needed

## 2010-09-06 NOTE — Medication Information (Signed)
Summary: rov/tm  Anticoagulant Therapy  Managed by: Bethena Midget, RN, BSN Referring MD: Rollene Rotunda MD PCP: Dr. [name] MD: Myrtis Ser MD, Tinnie Gens Indication 1: Atrial Fibrillation (ICD-427.31) Lab Used: [facility] Site: [facility] INR POC 2.5 INR RANGE 2 - 3  Dietary changes: no    Health status changes: no    Bleeding/hemorrhagic complications: no    Recent/future hospitalizations: no    Any changes in medication regimen? no    Recent/future dental: no  Any missed doses?: no       Is patient compliant with meds? yes       Allergies: 1)  ! Motrin  Anticoagulation Management History:      The patient is taking warfarin and comes in today for a routine follow up visit.  Positive risk factors for bleeding include an age of 75 years or older and history of CVA/TIA.  The bleeding index is 'intermediate risk'.  Positive CHADS2 values include History of CHF, History of HTN, Age > 75 years old, and Prior Stroke/CVA/TIA.  The start date was 10/02/2003.  Anticoagulation responsible provider: Myrtis Ser MD, Tinnie Gens.  INR POC: 2.5.  Cuvette Lot#: [id number].  Exp: 05/2011.    Anticoagulation Management Assessment/Plan:      The patient's current anticoagulation dose is Coumadin 5 mg tabs: as directed.  The target INR is 2 - 3.  The next INR is due 03/24/2010.  Anticoagulation instructions were given to patient.  Results were reviewed/authorized by Bethena Midget, RN, BSN.  He was notified by Bethena Midget, RN, BSN.         Prior Anticoagulation Instructions: INR 1.9 Today take extra 2.5mg s then resume 5mg s daily except 2.5mg s on Tuesdays and Thursdays. Recheck in 3 weeks.   Current Anticoagulation Instructions: INR 2.5 Continue 5mg s everyday except 2.5mg s Tuesdays and Thursdays. Recheck in 4 weeks.

## 2010-09-06 NOTE — Medication Information (Signed)
Summary: rov/ewj  Anticoagulant Therapy  Managed by: Eda Keys, PharmD Referring MD: Rollene Rotunda MD PCP: Dr. [name] MD: Excell Seltzer MD, Casimiro Needle Indication 1: Atrial Fibrillation (ICD-427.31) Lab Used: [facility] Site: [facility] INR POC 2.2 INR RANGE 2 - 3  Dietary changes: yes       Details: possibly more green veg than normal  Health status changes: no    Bleeding/hemorrhagic complications: no    Recent/future hospitalizations: no    Any changes in medication regimen? yes       Details: new ocular vitamin  Recent/future dental: no  Any missed doses?: no       Is patient compliant with meds? yes      Comments: Pt has been experiencing soreness of nipples and breast area for about 1 week.  He also was recently started on spironolactone this past December 13th by Dr. Kriste Basque.  I have contacted [facility] pharmacy where pt has medications refilled and he has in fact been having this medication refilled.  Pt reports that daughter takes care of medications for him, and he is not aware of his new medications.  Will send flag to Dr. Kriste Basque regarding this potential medication adverse effect.  For now I have instructed pt to stop spiro for a few days.  We will likely switch pt to eplerenone.  Will follow-up with pt this Wednesday.  Current Medications (verified): 1)  Oxygen .... 2 Liters As Needed 2)  Coumadin 5 Mg Tabs (Warfarin Sodium) .... As Directed 3)  Metoprolol Succinate 100 Mg Xr24h-Tab (Metoprolol Succinate) .... Take 1/2 Tab By Mouth Once Daily.... 4)  Verapamil Hcl Cr 240 Mg  Tbcr (Verapamil Hcl) .... Take 1 Tablet By Mouth Once A Day 5)  Micardis 80 Mg  Tabs (Telmisartan) .... Take 1 Tablet By Mouth Once A Day 6)  Clonidine Hcl 0.2 Mg Tabs (Clonidine Hcl) .... Take One Half By Mouth Two Times A Day 7)  Demadex 20 Mg  Tabs (Torsemide) .... Take 2 Tablets Two Times A Day 8)  Klor-Con M20 20 Meq  Tbcr (Potassium Chloride Crys Cr) .... Take 2 Tabs By Mouth Two  Times A Day. 9)  Simvastatin 40 Mg Tabs (Simvastatin) .... Take 1 Tab By Mouth At Bedtime... 10)  Flomax 0.4 Mg  Cp24 (Tamsulosin Hcl) .... Take 1 Tab Daily At Bedtime... 11)  Allopurinol 100 Mg  Tabs (Allopurinol) .Marland Kitchen.. 1 Tab By Mouth Once Daily For Gout Prevention... 12)  Meclizine Hcl 25 Mg Tabs (Meclizine Hcl) .... 1/2-1 By Mouth Three Times A Day As Needed Dizziness 13)  Spironolactone 25 Mg Tabs (Spironolactone) .... Take 1 Tab By Mouth Once Daily...  Allergies (verified): 1)  ! Motrin  Anticoagulation Management History:      The patient is taking warfarin and comes in today for a routine follow up visit.  Positive risk factors for bleeding include an age of 75 years or older and history of CVA/TIA.  The bleeding index is 'intermediate risk'.  Positive CHADS2 values include History of CHF, History of HTN, Age > 75 years old, and Prior Stroke/CVA/TIA.  The start date was 10/02/2003.  Anticoagulation responsible provider: Excell Seltzer MD, Casimiro Needle.  INR POC: 2.2.  Cuvette Lot#: [id number].  Exp: 11/2010.    Anticoagulation Management Assessment/Plan:      The patient's current anticoagulation dose is Coumadin 5 mg tabs: as directed.  The target INR is 2 - 3.  The next INR is due 10/11/2009.  Anticoagulation instructions were given to patient.  Results were reviewed/authorized by Eda Keys, PharmD.  He was notified by Eda Keys.         Prior Anticoagulation Instructions: INR 1.8  Take 1 tablet tomorrow, then resume same dosage 1 tablet daily except 1/2 tablet on Tuesdays and Thursdays.  Recheck in 3-4 weeks.    Current Anticoagulation Instructions: INR 2.2  Continue same dosing schedule.  Take 1/2 tablet on Tuesday and Thursday, and take 1 tablet all other days. Please stop taking Spironolactone for a few days, soreness/tenderness should begin to resolve within 48 hours. Return to clinic in 4 weeks.

## 2010-09-06 NOTE — Medication Information (Signed)
Summary: rov/eac  Anticoagulant Therapy  Managed by: Bethena Midget, RN, BSN Referring MD: Rollene Rotunda MD PCP: Dr. [name] MD: Gala Romney MD, Reuel Boom Indication 1: Atrial Fibrillation (ICD-427.31) Lab Used: [facility] Site: [facility] INR POC 1.9 INR RANGE 2 - 3  Dietary changes: no    Health status changes: no    Bleeding/hemorrhagic complications: no    Recent/future hospitalizations: no    Any changes in medication regimen? yes       Details: Simvastatin changed to Lipitor today.   Recent/future dental: no  Any missed doses?: yes     Details: Pt states he not sure if he missed a dose   Comments: Saw Dr Antoine Poche today  Allergies: 1)  ! Motrin  Anticoagulation Management History:      The patient is taking warfarin and comes in today for a routine follow up visit.  Positive risk factors for bleeding include an age of 75 years or older and history of CVA/TIA.  The bleeding index is 'intermediate risk'.  Positive CHADS2 values include History of CHF, History of HTN, Age > 56 years old, and Prior Stroke/CVA/TIA.  The start date was 10/02/2003.  Anticoagulation responsible provider: Madge Therrien MD, Reuel Boom.  INR POC: 1.9.  Cuvette Lot#: [id number].  Exp: 04/2011.    Anticoagulation Management Assessment/Plan:      The patient's current anticoagulation dose is Coumadin 5 mg tabs: as directed.  The target INR is 2 - 3.  The next INR is due 02/22/2010.  Anticoagulation instructions were given to patient.  Results were reviewed/authorized by Bethena Midget, RN, BSN.  He was notified by Bethena Midget, RN, BSN.         Prior Anticoagulation Instructions: INR 2.1 Continue 1 pill everyday except 1/2 pill on Tuesdays and Thursdays. Recheck in 4 weeks.   Current Anticoagulation Instructions: INR 1.9 Today take extra 2.5mg s then resume 5mg s daily except 2.5mg s on Tuesdays and Thursdays. Recheck in 3 weeks.

## 2010-09-06 NOTE — Assessment & Plan Note (Signed)
Summary: 6 months/apc   Primary Care Provider:  Dr. Kriste Basque  CC:  6 month ROV & review of mult medical problems....  History of Present Illness: 77 y/o BM here for a follow up visit... he has multiple medical problems as noted below...     ~  Jun10:  he is followed regularly by DrHochrein and the [facility]- last seen 6/10 and their notes are reviewed...CLONIDINE added 0.2mg - 1/2 Bid for BP... he states that he is doing well- walking some on a daily basis, no recent increase in swelling, denies cough/ phlegm, change in dyspnea, etc... his goal weight is 185# (this was his weight on discharge from hosp 1/09- weight here today= 208# (he states 199# at home).  ~  Dec10:  good 6 mo- one episode dizziness- likely labyrinthitis Rx'd Antivert & improved... daughter states taking meds properly w/ her help, BP 124/78, no change in CHF, remains on Coumadin in CC, etc... they wonder if we could decr the KCl from 6 pills/d & we discussed adding Spironolactone 25mg  & decr K20 to 4/d w/ f/u labs in 1 month...   ~  January 17, 2010:  last visit we added Aldactone 25mg /d hoping to spare him some of the six KCl tabs he takes daily due to his Demadex requirement... this helped a little & he is down to 4 KCl per day but he developed gynecomastia & [name] switched him to Eplerenone 25mg ... he has had some diminution of the gynecomastia & tenderness- and still on 4 Demadex & 4 KCl (overall it hardly seems worth the Inspra cost to save 2KCl tabs per day- but he wants to continue & ask DrHochrein at his next follow up)... BP controlled on his now 6-7 meds! & due for f/u in the CHF clinic... he remains in AFib on coumadin... Chol controlled w/ Simva40... renal funct & PSA stable on labs within the last 38mo...     Current Problem List:    PULMONARY HYPERTENSION (ICD-416.8) - on home oxygen 2L/min "Prn"...  ~  last 2DEcho w/ est PAsys= 64... he has not had a right heart cath...  HYPERTENSION (ICD-401.9) - on  METOPROLOL ER 100mg - 1/2 tab daily,  VERAPAMIL 240mg /d,  MICARDIS 80mg /d,  CLONIDINE 0.2mg - 1/2 Bid,  DEMADEX 20mg - 2Bid,  EPERENONE 25mg /d, & K20- 4/d... he was prev on Minoxidil  but this was  discontinued by Cardiology... BP= 126/82 today and not checking at home, tolerating meds well... denies HA, visual changes, CP, palipit, dizziness, syncope... he has chr DOE without change and mild edema...  ~  6/10: DrHochrein added back the Clonidine & asked him, again, to monitor BP at home.  ~  12/10:  they requested trial to decr K20's from 6/d required due to his Demedex... suggest adding ALDACTONE 25mg /d & decr K20 to 4/d w/ f/u labs...  ~  6/11:  Aldactone caused gynecomastia & was switched to EPLERENONE 25mg /d by [name] w/ some improvment in side effect.  CHF (ICD-428.0) - see meds above... followed in the CHF Clinic by DrHochrein;  he has severe Diastolic CHF, w/ normal C's by cath in 2005, & normal LV systolic function w/ EF=75%... notes DOE- no change.  ~  labs 6/09 showed BNP= 761  ~  labs 9/09 showed BNP= 486  ~  labs 12/09 showed BNP = 568  ~  labs 6/10 showed BNP= 421  ~  labs 12/10 showed BNP= 577... adding SPIRONOLACTONE 25mg /d.  ~  labs 1/11 showed BNP= 395  AORTIC  STENOSIS (ICD-424.1) - moderate by 2DEcho, repeat 2DEcho 1/09 hosp = similiar mod AS... followed by drHochrein.  ATRIAL FIBRILLATION (ICD-427.31) - on COUMADIN & followed in the Coumadin Clinic.  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  ~  CXR 1/09 showed unchanged right PA aneurysm, stable cardiomeg, elevated left hemidiaph w/ atx...  ~  CXR 6/10 showed no change, Ao tortuous and calcif, NAD...  HYPERCHOLESTEROLEMIA (ICD-272.0) - on SIMVASTATIN 40mg /d...  ~  FLP 1/09 showed TChol 95, TG 39, HDL 36, LDL 51  ~  FLP 12/09 showed TChol 167, TG 68, HDL 64, LDL 90  ~  FLP 1/11 showed TChol 153, TG 101, HDL 50, LDL 83  HEMORRHOIDS (ICD-455.6)  RENAL  INSUFFICIENCY (ICD-588.9) - Creat=1.4 - 1.6 during 1/09 hosp...  ~  labs 6/09 & 9/09 showed Creat= 1.3  ~  labs 12/09 showed BUN= 20, Creat= 1.3, K= 4.0  ~  labs 6/10 showed BUN= 24, Creat= 1.3, K= 3.8  ~  labs 12/10 showed BUN= 12, Creat= 1.2, K= 3.6 (on 6 K20/d)  BENIGN PROSTATIC HYPERTROPHY, HX OF (ICD-V13.8) - followed by DrTannenbaum & taking FLOMAX 0.4mg /d... last seen 5/09 and his note is reviewed...  ~  labs 12/09 showed PSA= 1.42  ~  labs 2/11 showed PSA= 1.65  CVA (ICD-434.91) - Stroke in 1990 in the setting of HBP...  DEGENERATIVE JOINT DISEASE, GENERALIZED (ICD-715.00) - he uses OTC pain meds Prn...  GOUT (ICD-274.9) - on ALLOPURINOL 100mg /d,  & now off the Colchicine...  ~  labs 10/08 showed Uric= 10.3  ~  labs 12/09 showed Uric= 7.7.Marland Kitchen. he wants to continue same meds.  INGUINAL HERNIA (ICD-550.90)  Health Maintenance - he received both Flu shots in 2009... had the combined Flu vaccine in 2010... f/u PNEUMOVAX at his request 2010...   Preventive Screening-Counseling & Management  Alcohol-Tobacco     Smoking Status: no tobacco use     Cigars/week: occasional cigar  Allergies: 1)  ! Motrin  Comments:  Nurse/Medical Assistant: The patient's medications and allergies were reviewed with the patient and were updated in the Medication and Allergy Lists.  Past History:  Past Medical History: PULMONARY HYPERTENSION (ICD-416.8) HYPERTENSION (ICD-401.9) CHF (ICD-428.0) AORTIC STENOSIS (ICD-424.1) ATRIAL FIBRILLATION (ICD-427.31) ANEURYSM OF PULMONARY ARTERY (ICD-417.1) HYPERCHOLESTEROLEMIA (ICD-272.0) HEMORRHOIDS (ICD-455.6) RENAL INSUFFICIENCY (ICD-588.9) BENIGN PROSTATIC HYPERTROPHY, HX OF (ICD-V13.8) CVA (ICD-434.91) DEGENERATIVE JOINT DISEASE, GENERALIZED (ICD-715.00) GOUT (ICD-274.9) INGUINAL HERNIA (ICD-550.90)  Past Surgical History: Appendectomy Inguinal herniorrhaphy - rt side Cosmetic Eye Surgery Cataract Surgery  Family History: Reviewed  history from 06/17/2008 and no changes required. Noncontributory for Early CAD  Social History: Reviewed history from 06/17/2008 and no changes required. Retired from [employer] Married No Tobacco except occasional cigarsSmoking Status:  no tobacco use  Review of Systems      See HPI       The patient complains of decreased hearing, dyspnea on exertion, and difficulty walking.  The patient denies anorexia, fever, weight loss, weight gain, vision loss, hoarseness, chest pain, syncope, peripheral edema, prolonged cough, headaches, hemoptysis, abdominal pain, melena, hematochezia, severe indigestion/heartburn, hematuria, incontinence, muscle weakness, suspicious skin lesions, transient blindness, depression, unusual weight change, abnormal bleeding, enlarged lymph nodes, and angioedema.         He notes improvement in best tenderness off the aldactone...  Vital Signs:  Patient profile:   77 year old male Height:      71 inches Weight:      207  pounds O2 Sat:      93 % on Room air Temp:     96.2 degrees F oral Pulse rate:   102 / minute BP sitting:   126 / 82  (left arm) Cuff size:   regular  Vitals Entered By: Randell Loop CMA (January 17, 2010 1:57 PM)  O2 Sat at Rest %:  93 O2 Flow:  Room air CC: 6 month ROV & review of mult medical problems... Is Patient Diabetic? No Pain Assessment Patient in pain? no      Comments no changes in meds today   Physical Exam  Additional Exam:  WD, WN, 77 y/o BM in NAD... GENERAL:  Alert & oriented; pleasant & cooperative... HEENT:  [place]/AT, EOM-wnl, PERRLA, EACs-clear, TMs-wnl, NOSE-clear, THROAT-clear & wnl. NECK:  Supple w/ fairROM; no JVD; sl decr carotid impulses w/ transmit murmur; no thyromegaly or nodules palpated; no lymphadenopathy. CHEST:  Clear to P & A; without wheezes/ rales/ or rhonchi heard... HEART:  sl irreg, gr 2/6 AS murmur at base, without rubs or gallops apprec... ABDOMEN:  Soft & nontender; normal bowel sounds; no  organomegaly or masses detected. EXT: without deformities, mild arthritic changes; no varicose veins/ +venous insuffic/ tr edema. NEURO:  CN's intact; no focal neuro deficits... DERM:  No lesions noted; no rash etc...    Impression & Recommendations:  Problem # 1:  HYPERTENSION (ICD-401.9) BP controlled on meds... we tried to spare him some of the KCl w/ the addition of Spironolactone but he developed gynecomastia & he was only able to decr the KCl from 6 to 4 pills daily.Marland KitchenMarland Kitchen I would have stopped the Aldactone & gone back to 6 KCls but [name] swithched him to Eplerenone 25mg  & the breast tenderness has decreased... for now he wants to continue as is & discuss w/ DrHochrein in the CHF clinic...  His updated medication list for this problem includes:    Metoprolol Succinate 100 Mg Xr24h-tab (Metoprolol succinate) .Marland Kitchen... Take 1/2 tab by mouth once daily....    Verapamil Hcl Cr 240 Mg Tbcr (Verapamil hcl) .Marland Kitchen... Take 1 tablet by mouth once a day    Micardis 80 Mg Tabs (Telmisartan) .Marland Kitchen... Take 1 tablet by mouth once a day    Clonidine Hcl 0.2 Mg Tabs (Clonidine hcl) .Marland Kitchen... Take one half by mouth two times a day    Demadex 20 Mg Tabs (Torsemide) .Marland Kitchen... Take 2 tablets two times a day    Eplerenone 25 Mg Tabs (Eplerenone) .Marland Kitchen... Take 1  daily  Problem # 2:  PULMONARY HYPERTENSION (ICD-416.8) Last 2DEcho per Cards was 1/09... he uses O2 intermittently.  Problem # 3:  ATRIAL FIBRILLATION (ICD-427.31) He has CHF/ DD, AS, AFib> all followed by Cards & the CC... His updated medication list for this problem includes:    Coumadin 5 Mg Tabs (Warfarin sodium) .Marland Kitchen... As directed    Metoprolol Succinate 100 Mg Xr24h-tab (Metoprolol succinate) .Marland Kitchen... Take 1/2 tab by mouth once daily....    Verapamil Hcl Cr 240 Mg Tbcr (Verapamil hcl) .Marland Kitchen... Take 1 tablet by mouth once a day  Problem # 4:  ANEURYSM OF PULMONARY ARTERY (ICD-417.1) Followed by DrGearhardt & stable over the yrs...  Problem # 5:   HYPERCHOLESTEROLEMIA (ICD-272.0) FLP stable on the Simva40 + diet... His updated medication list for this problem includes:    Simvastatin 40 Mg Tabs (Simvastatin) .Marland Kitchen... Take 1 tab by mouth at bedtime...  Problem # 6:  RENAL INSUFFICIENCY (ICD-588.9) Stable renal function and last BNP was improved.Marland KitchenMarland Kitchen  see lab cumulative summary sheets.  Problem # 7:  DEGENERATIVE JOINT DISEASE, GENERALIZED (ICD-715.00) Hx DJD & Gout>  on Allopurinol & OTC anti-inflamm Rx Prn...  Problem # 8:  OTHER MEDICAL PROBLEMS AS NOTED>>>  Complete Medication List: 1)  Lumigan 0.03 % Soln (Bimatoprost) .Marland Kitchen.. 1 drop each eye at bedtime 2)  Icaps Caps (Multiple vitamins-minerals) .Marland Kitchen.. 1 two times a day 3)  Oxygen  .... 2 liters as needed 4)  Coumadin 5 Mg Tabs (Warfarin sodium) .... As directed 5)  Metoprolol Succinate 100 Mg Xr24h-tab (Metoprolol succinate) .... Take 1/2 tab by mouth once daily.... 6)  Verapamil Hcl Cr 240 Mg Tbcr (Verapamil hcl) .... Take 1 tablet by mouth once a day 7)  Micardis 80 Mg Tabs (Telmisartan) .... Take 1 tablet by mouth once a day 8)  Clonidine Hcl 0.2 Mg Tabs (Clonidine hcl) .... Take one half by mouth two times a day 9)  Demadex 20 Mg Tabs (Torsemide) .... Take 2 tablets two times a day 10)  Eplerenone 25 Mg Tabs (Eplerenone) .... Take 1  daily 11)  Klor-con M20 20 Meq Tbcr (Potassium chloride crys cr) .... Take 2 tabs by mouth two times a day as directed 12)  Simvastatin 40 Mg Tabs (Simvastatin) .... Take 1 tab by mouth at bedtime... 13)  Flomax 0.4 Mg Cp24 (Tamsulosin hcl) .... Take 1 tab daily at bedtime... 14)  Allopurinol 100 Mg Tabs (Allopurinol) .Marland Kitchen.. 1 tab by mouth once daily for gout prevention... 15)  Vitamin D 2000 Unit Tabs (Cholecalciferol) .Marland Kitchen.. 1 every am 16)  Meclizine Hcl 25 Mg Tabs (Meclizine hcl) .... 1/2-1 by mouth three times a day as needed dizziness  Patient Instructions: 1)  Today we updated your med list- see below.... 2)  Continue your current meds the same  for now... 3)  Stay as active as possible... 4)  Call for any questions.Marland KitchenMarland Kitchen 5)  Please schedule a follow-up appointment in 6 months.

## 2010-09-06 NOTE — Progress Notes (Signed)
Summary: meds question  Phone Note Call from Patient Call back at Home Phone [phone]   Caller: Patient Call For: nadel Summary of Call: he has a question about last med that you wrote cant tell me the name but he has a issue with it Initial call taken by: Lacinda Axon,  November 10, 2009 8:49 AM  Follow-up for Phone Call        Pt states he has been having breast tenderness x 3 weeks. He mentioned this to nurse at coumadin clinic and they told him it might be his spironolactone so they told him to decreas the dose to half tablet of the 25mg  daily. pt states tenderness has improved some but it is not gone and he wants to know what SN recs are for this. Please advise. Carron Curie CMA  November 10, 2009 9:46 AM   Additional Follow-up for Phone Call Additional follow up Details #1::        per SN---he can either cont with the 1/2 tab daily of the sprionolactone or he can stop this med.  thanks Randell Loop CMA  November 10, 2009 10:55 AM     Additional Follow-up for Phone Call Additional follow up Details #2::    called, spoke with pt.  Pt informed of above statement and recs per SN.  Pt then requested to let SN know that he was started on "eye vitamins and eye drops" before noticing the breast tenderness.  Pt would also like to know if there would be any risks in stopping the spirionolactione.  Will forward to SN - pls advise. Thanks! Gweneth Dimitri RN  November 10, 2009 11:04 AM   per SN---spironlactone is a diuretic---if fluids worsens we can use another  med with no problems.   eye drops and vits are ok to use.  thanks---if any other concerns pt will need rov with SN to discuss.  thanks Randell Loop CMA  November 10, 2009 12:36 PM   LMTCB.Michel Bickers [facility]  November 10, 2009 1:39 PM  Additional Follow-up for Phone Call Additional follow up Details #3:: Details for Additional Follow-up Action Taken: LMOMTCB x 1. Abigail Miyamoto RN  November 11, 2009 10:50 AM   [facility] Gweneth Dimitri RN  November 11, 2009 3:04 PM  pt called back...please call him on his cell [phone].  Eugene Gavia  November 12, 2009 8:37 AM   LMTCB. Flag was not removed this am when pt called back so we did not know he called. Carron Curie CMA  November 12, 2009 3:24 PM  The pt still c/o breast tenderness on fluid medication and will see TP today at 3pm to discuss.Michel Bickers [facility]  November 15, 2009 11:24 AM  please call pt back at number listed.  He said ok to leave on machine.  Eugene Gavia  November 15, 2009 10:47 AM

## 2010-09-06 NOTE — Progress Notes (Signed)
Summary: refill   Phone Note Refill Request   Refills Requested: Medication #1:  COUMADIN 5 MG TABS as directed   Supply Requested: 3 months [facility] [address]   Method Requested: Fax to Local Pharmacy Initial call taken by: Migdalia Dk,  August 13, 2009 4:01 PM  Follow-up for Phone Call        Rx refill sent to the pharmacy as requested 90 day supply with 1 additional refill sent electronically.  Called spoke with pt aware rx refill sent to pharmacy. Follow-up by: Cloyde Reams RN,  August 13, 2009 4:18 PM    Prescriptions: COUMADIN 5 MG TABS (WARFARIN SODIUM) as directed  #90 x 1   Entered by:   Cloyde Reams RN   Authorized by:   Rollene Rotunda, MD, [facility]   Signed by:   Cloyde Reams RN on 08/13/2009   Method used:   Electronically to        [id number] (retail)       [address] [place], [place]  [address]       Ph: [phone]       Fax: [phone]   RxID:   [id number]

## 2010-09-06 NOTE — Assessment & Plan Note (Signed)
Summary: FLU SHOT ///KP   Nurse Visit   Allergies: 1)  ! Motrin  Immunizations Administered:  Influenza Vaccine # 1:    Vaccine Type: Fluvax MCR    Site: left deltoid    Mfr: GlaxoSmithKline    Dose: 0.5 ml    Route: IM    Given by: Tammy Scott    Exp. Date: 02/04/2011    Lot #: [id number]    VIS given: 03/01/10 version given July 12, 2010.  Flu Vaccine Consent Questions:    Do you have a history of severe allergic reactions to this vaccine? no    Any prior history of allergic reactions to egg and/or gelatin? no    Do you have a sensitivity to the preservative Thimersol? no    Do you have a past history of Guillan-Barre Syndrome? no    Do you currently have an acute febrile illness? no    Have you ever had a severe reaction to latex? no    Vaccine information given and explained to patient? yes  Orders Added: 1)  Influenza Vaccine MCR [00025]

## 2010-09-06 NOTE — Letter (Signed)
Summary: Custom - Lipid  [facility], Main Office  [address]   [place], [place] [address]   Phone: [phone]  Fax: [phone]     April 05, 2010 MRN: [id number]   [facility] [address] [place], [place]  [address]   Dear Mr. Eng,  We have reviewed your cholesterol results.  They are as follows:     Total Cholesterol:    158 (Desirable: less than 200)       HDL  Cholesterol:     39.80  (Desirable: greater than 40 for men and 50 for women)       LDL Cholesterol:       102  (Desirable: less than 100 for low risk and less than 70 for moderate to high risk)       Triglycerides:       80.0  (Desirable: less than 150)  Our recommendations include:  Continue current therapy and improve diet.   Call our office at the number listed above if you have any questions.  Lowering your LDL cholesterol is important, but it is only one of a large number of "risk factors" that may indicate that you are at risk for heart disease, stroke or other complications of hardening of the arteries.  Other risk factors include:   A.  Cigarette Smoking* B.  High Blood Pressure* C.  Obesity* D.   Low HDL Cholesterol (see yours above)* E.   Diabetes Mellitus (higher risk if your is uncontrolled) F.  Family history of premature heart disease G.  Previous history of stroke or cardiovascular disease    *These are risk factors YOU HAVE CONTROL OVER.  For more information, visit .  There is now evidence that lowering the TOTAL CHOLESTEROL AND LDL CHOLESTEROL can reduce the risk of heart disease.  The American Heart Association recommends the following guidelines for the treatment of elevated cholesterol:        1.  If there is now current heart disease and less than two risk factors, TOTAL CHOLESTEROL should be less than 200 and LDL CHOLESTEROL should be less than 100. 2.  If there is current heart disease or two or more risk factors, TOTAL CHOLESTEROL should be  less than 200 and LDL CHOLESTEROL should be less than 70.  A diet low in cholesterol, saturated fat, and calories is the cornerstone of treatment for elevated cholesterol.  Cessation of smoking and exercise are also important in the management of elevated cholesterol and preventing vascular disease.  Studies have shown that 30 to 60 minutes of physical activity most days can help lower blood pressure, lower cholesterol, and keep your weight at a healthy level.  Drug therapy is used when cholesterol levels do not respond to therapeutic lifestyle changes (smoking cessation, diet, and exercise) and remains unacceptably high.  If medication is started, it is important to have you levels checked periodically to evaluate the need for further treatment options.    Thank you,       Sander Nephew, RN for  Dr Rollene Rotunda [facility] Team

## 2010-09-06 NOTE — Miscellaneous (Signed)
  Clinical Lists Changes  Medications: Removed medication of SPIRONOLACTONE 25 MG TABS (SPIRONOLACTONE) take 1 tab by mouth once daily... Added new medication of EPLERENONE 25 MG TABS (EPLERENONE) Take 1 tablet by mouth once daily. - Signed Rx of EPLERENONE 25 MG TABS (EPLERENONE) Take 1 tablet by mouth once daily.;  #30 x 0;  Signed;  Entered by: Eda Keys;  Authorized by: Rollene Rotunda, MD, [facility];  Method used: Electronically to [id number]*, [address], [place], [place]  [address], Ph: [phone], Fax: [phone]    Prescriptions: EPLERENONE 25 MG TABS (EPLERENONE) Take 1 tablet by mouth once daily.  #30 x 0   Entered by:   Eda Keys   Authorized by:   Rollene Rotunda, MD, [facility]   Signed by:   Eda Keys on 09/30/2009   Method used:   Electronically to        [id number]* (retail)       [address] [place], [place]  [address]       Ph: [phone]       Fax: [phone]   RxID:   [id number]

## 2010-09-06 NOTE — Progress Notes (Signed)
Summary: medication question   Phone Note Call from Patient Call back at [phone]   Caller: Daughter/[name] Alan Ripper Reason for Call: Talk to Nurse, Talk to Doctor Summary of Call: they wants a 30day supply of clonidine right now they have a 15day supply and he takes half a pill twice a day. Initial call taken by: Omer Jack,  January 27, 2010 4:35 PM  Follow-up for Phone Call        [name] aware rx for # 30 was sent in to pharmacy at her request.  Pt is to follow up on June 28,2011 and Martie Lee is aware. Follow-up by: Charolotte Capuchin, RN,  January 27, 2010 6:02 PM    Prescriptions: CLONIDINE HCL 0.2 MG TABS (CLONIDINE HCL) take one half by mouth two times a day  #30 x 2   Entered by:   Charolotte Capuchin, RN   Authorized by:   Rollene Rotunda, MD, [facility]   Signed by:   Charolotte Capuchin, RN on 01/27/2010   Method used:   Electronically to        [id number]* (retail)       [address] [place], [place]  [address]       Ph: [phone]       Fax: [phone]   RxID:   [id number]

## 2010-09-06 NOTE — Progress Notes (Signed)
Summary: talk to nurse  Phone Note Call from Patient Call back at ex [phone]   Caller: dr hochrein's office  ( pam Call For: [name] Summary of Call: need to talk to nurse  Initial call taken by: Rickard Patience,  November 24, 2009 2:08 PM  Follow-up for Phone Call        Pam at Dr. Lindaann Slough office states SN has been monitoring the pt Spironolactone, but we denied his refills and sent it to them to be addressed. Dr.Hochrein states this needs to be refilled through our office. Please advise if ok to fill. Thanks. Carron Curie CMA  November 24, 2009 2:30 PM   Additional Follow-up for Phone Call Additional follow up Details #1::        yes i did deny this med due to the fact that when it comes through it has Dr. Lindaann Slough name on it and not SN---that is why its been denied multiple times---but i will refill it for him.  thanks Randell Loop CMA  November 24, 2009 2:36 PM

## 2010-09-06 NOTE — Medication Information (Signed)
Summary: rof/kh   Anticoagulant Therapy  Managed by: Tammy Sours, Pharm.D. Referring MD: Rollene Rotunda MD PCP: Dr. [name] MD: Shirlee Latch MD, Freida Busman Indication 1: Atrial Fibrillation (ICD-427.31) Lab Used: [facility] Site: [facility] INR POC 2.2 INR RANGE 2 - 3  Dietary changes: no    Health status changes: no    Bleeding/hemorrhagic complications: no    Recent/future hospitalizations: no    Any changes in medication regimen? no    Recent/future dental: no  Any missed doses?: no       Is patient compliant with meds? yes       Allergies: 1)  ! Motrin  Anticoagulation Management History:      The patient is taking warfarin and comes in today for a routine follow up visit.  Positive risk factors for bleeding include an age of 75 years or older and history of CVA/TIA.  The bleeding index is 'intermediate risk'.  Positive CHADS2 values include History of CHF, History of HTN, Age > 75 years old, and Prior Stroke/CVA/TIA.  The start date was 10/02/2003.  Anticoagulation responsible provider: Shirlee Latch MD, [place].  INR POC: 2.2.  Cuvette Lot#: [id number].  Exp: 06/2011.    Anticoagulation Management Assessment/Plan:      The patient's current anticoagulation dose is Coumadin 5 mg tabs: as directed.  The target INR is 2 - 3.  The next INR is due 07/05/2010.  Anticoagulation instructions were given to patient.  Results were reviewed/authorized by Tammy Sours, Pharm.D..         Prior Anticoagulation Instructions: INR 2.2  Continue taking one tablet every day except for one-half tablet on Tuesday and Thursday.  Recheck in four weeks.    Current Anticoagulation Instructions: INR 2.2  Continue taking one tablet (5mg ) every day except for 1/2 tablet (2.5 mg) on Tuesday and Thursday. Recheck in 4 weeks.

## 2010-09-06 NOTE — Medication Information (Signed)
Summary: rov/tm  Anticoagulant Therapy  Managed by: Weston Brass, PharmD Referring MD: Rollene Rotunda MD PCP: Dr. [name] MD: Johney Frame MD, Fayrene Fearing Indication 1: Atrial Fibrillation (ICD-427.31) Lab Used: [facility] Site: [facility] INR POC 2.5 INR RANGE 2 - 3  Dietary changes: no    Health status changes: no    Bleeding/hemorrhagic complications: no    Recent/future hospitalizations: no    Any changes in medication regimen? no    Recent/future dental: no  Any missed doses?: no       Is patient compliant with meds? yes       Allergies: 1)  ! Motrin  Anticoagulation Management History:      The patient is taking warfarin and comes in today for a routine follow up visit.  Positive risk factors for bleeding include an age of 75 years or older and history of CVA/TIA.  The bleeding index is 'intermediate risk'.  Positive CHADS2 values include History of CHF, History of HTN, Age > 4 years old, and Prior Stroke/CVA/TIA.  The start date was 10/02/2003.  Anticoagulation responsible provider: Tondra Reierson MD, Fayrene Fearing.  INR POC: 2.5.  Exp: 05/2011.    Anticoagulation Management Assessment/Plan:      The patient's current anticoagulation dose is Coumadin 5 mg tabs: as directed.  The target INR is 2 - 3.  The next INR is due 04/27/2010.  Anticoagulation instructions were given to patient.  Results were reviewed/authorized by Weston Brass, PharmD.  He was notified by Weston Brass PharmD.         Prior Anticoagulation Instructions: INR 2.5 Continue 5mg s everyday except 2.5mg s Tuesdays and Thursdays. Recheck in 4 weeks.    Current Anticoagulation Instructions: INR 2.5  Continue same dose of 1 tablet every day except 1/2 tablet on Tuesday and Thursday.  Recheck INR in 4 weeks.

## 2010-09-06 NOTE — Medication Information (Signed)
Summary: rov/cb  Anticoagulant Therapy  Managed by: Bethena Midget, RN, BSN Referring MD: Rollene Rotunda MD PCP: Dr. [name] MD: Daleen Squibb MD, Maisie Fus Indication 1: Atrial Fibrillation (ICD-427.31) Lab Used[facility]  Site: [facility] INR POC 2.1 INR RANGE 2 - 3  Dietary changes: no    Health status changes: no    Bleeding/hemorrhagic complications: no    Recent/future hospitalizations: no    Any changes in medication regimen? no    Recent/future dental: no  Any missed doses?: no       Is patient compliant with meds? yes       Current Medications (verified): 1)  Oxygen .... 2 Liters As Needed 2)  Coumadin 5 Mg Tabs (Warfarin Sodium) .... As Directed 3)  Metoprolol Succinate 100 Mg Xr24h-Tab (Metoprolol Succinate) .... Take 1/2 Tab By Mouth Once Daily.... 4)  Verapamil Hcl Cr 240 Mg  Tbcr (Verapamil Hcl) .... Take 1 Tablet By Mouth Once A Day 5)  Micardis 80 Mg  Tabs (Telmisartan) .... Take 1 Tablet By Mouth Once A Day 6)  Clonidine Hcl 0.2 Mg Tabs (Clonidine Hcl) .... Take One Half By Mouth Two Times A Day 7)  Demadex 20 Mg  Tabs (Torsemide) .... Take 2 Tablets Two Times A Day 8)  Klor-Con M20 20 Meq  Tbcr (Potassium Chloride Crys Cr) .... Take 2 Tabs By Mouth Two Times A Day As Directed 9)  Simvastatin 40 Mg Tabs (Simvastatin) .... Take 1 Tab By Mouth At Bedtime... 10)  Flomax 0.4 Mg  Cp24 (Tamsulosin Hcl) .... Take 1 Tab Daily At Bedtime... 11)  Allopurinol 100 Mg  Tabs (Allopurinol) .Marland Kitchen.. 1 Tab By Mouth Once Daily For Gout Prevention... 12)  Meclizine Hcl 25 Mg Tabs (Meclizine Hcl) .... 1/2-1 By Mouth Three Times A Day As Needed Dizziness 13)  Eplerenone 25 Mg Tabs (Eplerenone) .... Take 1  Daily 14)  Vitamin D 2000 Unit Tabs (Cholecalciferol) .Marland Kitchen.. 1 Every Am 15)  Icaps  Caps (Multiple Vitamins-Minerals) .Marland Kitchen.. 1 Two Times A Day 16)  Lumigan 0.03 % Soln (Bimatoprost) .Marland Kitchen.. 1 Drop Each Eye At Bedtime  Allergies: 1)  ! Motrin  Anticoagulation Management  History:      The patient is taking warfarin and comes in today for a routine follow up visit.  Positive risk factors for bleeding include an age of 48 years or older and history of CVA/TIA.  The bleeding index is 'intermediate risk'.  Positive CHADS2 values include History of CHF, History of HTN, Age > 45 years old, and Prior Stroke/CVA/TIA.  The start date was 10/02/2003.  Anticoagulation responsible provider: Daleen Squibb MD, Maisie Fus.  INR POC: 2.1.  Cuvette Lot#: [id number].  Exp: 01/2011.    Anticoagulation Management Assessment/Plan:      The patient's current anticoagulation dose is Coumadin 5 mg tabs: as directed.  The target INR is 2 - 3.  The next INR is due 01/06/2010.  Anticoagulation instructions were given to patient.  Results were reviewed/authorized by Bethena Midget, RN, BSN.  He was notified by Bethena Midget, RN, BSN.         Prior Anticoagulation Instructions: INR 2.0. Take 1 tablet daily except 0.5 tablet on Tues and Thurs.   Current Anticoagulation Instructions: INR 2.1 Continue 1 pill everyday except 1/2 pill on Tuesdays and Thursdays. Recheck in 4 weeks.

## 2010-09-08 NOTE — Medication Information (Signed)
Summary: rov/tm  Anticoagulant Therapy  Managed by: Bethena Midget, RN, BSN Referring MD: Rollene Rotunda MD PCP: Dr. [name] MD: Graciela Husbands MD, Viviann Spare Indication 1: Atrial Fibrillation (ICD-427.31) Lab Used: [facility] Site: [facility] INR POC 2.7 INR RANGE 2 - 3  Dietary changes: no    Health status changes: no    Bleeding/hemorrhagic complications: no    Recent/future hospitalizations: no    Any changes in medication regimen? yes       Details: Demadex was decreased from 4 tabs daily to 3 tabs., Klor-Con  decreased to 3 tabs daily.   Recent/future dental: no  Any missed doses?: no       Is patient compliant with meds? yes       Allergies: 1)  ! Motrin  Anticoagulation Management History:      The patient is taking warfarin and comes in today for a routine follow up visit.  Positive risk factors for bleeding include an age of 18 years or older, history of CVA/TIA, and presence of serious comorbidities.  The bleeding index is 'high risk'.  Positive CHADS2 values include History of CHF, History of HTN, Age > 75 years old, and Prior Stroke/CVA/TIA.  The start date was 10/02/2003.  Anticoagulation responsible provider: Graciela Husbands MD, Viviann Spare.  INR POC: 2.7.  Cuvette Lot#: [id number].  Exp: 08/2011.    Anticoagulation Management Assessment/Plan:      The patient's current anticoagulation dose is Coumadin 5 mg tabs: as directed.  The target INR is 2 - 3.  The next INR is due 09/27/2010.  Anticoagulation instructions were given to patient.  Results were reviewed/authorized by Bethena Midget, RN, BSN.  He was notified by Bethena Midget, RN, BSN.         Prior Anticoagulation Instructions: INR 2.3 Continue 5mg  everyday except 2.5mg s on Tuesdays and Thursdays. Recheck in 4 weeks.   Current Anticoagulation Instructions: INR 2.7 Continue 1 tablet everyday except 1/2 tablet on Tuesdays and Thursdays. Recheck in 4 weeks.

## 2010-09-08 NOTE — Progress Notes (Signed)
Summary: prescription  Phone Note Call from Patient   Caller: daughter/[name] [name] Call For: dr Kriste Basque Summary of Call: Patients daughter [name] [name] phoned and stated that her father was out of refills and needed new prescriptions called into [facility] [address] for his Verapril 240 mg. She can be reached at  [phone] Initial call taken by: Vedia Coffer,  August 24, 2010 2:50 PM  Follow-up for Phone Call        refills of the verapamil have been sent in to the pharmacy electronically.  called and spoke with pts daughter and she is aware [name] [name] CMA  August 24, 2010 3:30 PM

## 2010-09-08 NOTE — Assessment & Plan Note (Signed)
Summary: rov  Medications Added DEMADEX 20 MG  TABS (TORSEMIDE) take 2 tabs in the AM and one in the pm KLOR-CON M20 20 MEQ  TBCR (POTASSIUM CHLORIDE CRYS CR) 1 by mouth three times a day      Allergies Added:   Visit Type:  Follow-up Primary [name]:  Dr. Kriste Basque  CC:  Diastolic CHF.  History of Present Illness: The patient presents for followup the above. Since I last saw him he has had no new cardiovascular complaints. He will occasionally get some dyspnea with exertion but he thinks this is relatively stable. He denies any PND or orthopnea. His weights over within 2 or 3 pounds daily. However, he was noted recently to have a BUN that was elevated at 32 and a creatinine of 1.8 from a previous of 1.2. He was referred back for heart failure followup. He denies any chest pressure, neck or arm discomfort. He denies any palpitations, presyncope or syncope.  Current Medications (verified): 1)  Lumigan 0.03 % Soln (Bimatoprost) .Marland Kitchen.. 1 Drop Each Eye At Bedtime 2)  Oxygen .... 2 Liters As Needed 3)  Coumadin 5 Mg Tabs (Warfarin Sodium) .... As Directed 4)  Metoprolol Succinate 100 Mg Xr24h-Tab (Metoprolol Succinate) .... Take 1/2 Tab By Mouth Once Daily.... 5)  Verapamil Hcl Cr 240 Mg  Tbcr (Verapamil Hcl) .... Take 1 Tablet By Mouth Once A Day 6)  Micardis 80 Mg  Tabs (Telmisartan) .... Take 1 Tablet By Mouth Once A Day 7)  Clonidine Hcl 0.2 Mg Tabs (Clonidine Hcl) .... Take One Half By Mouth Two Times A Day 8)  Demadex 20 Mg  Tabs (Torsemide) .... Take 2 Tablets Two Times A Day 9)  Eplerenone 25 Mg Tabs (Eplerenone) .... Take 1  Daily 10)  Klor-Con M20 20 Meq  Tbcr (Potassium Chloride Crys Cr) .Marland Kitchen.. 1 By Mouth Three Times A Day 11)  Lipitor 20 Mg Tabs (Atorvastatin Calcium) .... Take One Tablet By Mouth Daily. 12)  Flomax 0.4 Mg  Cp24 (Tamsulosin Hcl) .... Take 1 Tab Daily At Bedtime... 13)  Allopurinol 100 Mg  Tabs (Allopurinol) .Marland Kitchen.. 1 Tab By Mouth Once Daily For Gout Prevention... 14)   Vitamin D 2000 Unit Tabs (Cholecalciferol) .Marland Kitchen.. 1 Every Am 15)  Meclizine Hcl 25 Mg Tabs (Meclizine Hcl) .... 1/2-1 By Mouth Three Times A Day As Needed Dizziness  Allergies (verified): 1)  ! Motrin  Past History:  Past Medical History: Reviewed history from 07/18/2010 and no changes required. PULMONARY HYPERTENSION (ICD-416.8) HYPERTENSION (ICD-401.9) CHF (ICD-428.0) AORTIC STENOSIS (ICD-424.1) ATRIAL FIBRILLATION (ICD-427.31) ANEURYSM OF PULMONARY ARTERY (ICD-417.1) HYPERCHOLESTEROLEMIA (ICD-272.0) HEMORRHOIDS (ICD-455.6) RENAL INSUFFICIENCY (ICD-588.9) BENIGN PROSTATIC HYPERTROPHY, HX OF (ICD-V13.8) CVA (ICD-434.91) DEGENERATIVE JOINT DISEASE, GENERALIZED (ICD-715.00) GOUT (ICD-274.9) INGUINAL HERNIA (ICD-550.90)  Past Surgical History: Reviewed history from 07/18/2010 and no changes required. Appendectomy Inguinal herniorrhaphy - rt side Cosmetic Eye Surgery Cataract Surgery  Review of Systems       As stated in the HPI and negative for all other systems.   Vital Signs:  Patient profile:   77 year old male Height:      71 inches Weight:      208 pounds BMI:     29.11 Pulse rate:   95 / minute Resp:     16 per minute BP sitting:   118 / 66  (right arm)  Vitals Entered By: Marrion Coy, CNA (August 02, 2010 2:01 PM)  Physical Exam  General:  Well developed, well nourished, in no acute distress. Head:  normocephalic and atraumatic Mouth:  Teeth, gums and palate normal. Oral mucosa normal. Neck:  Neck supple, no JVD. No masses, thyromegaly or abnormal cervical nodes. Chest Wall:  no deformities Lungs:  Clear bilaterally to auscultation and percussion. Abdomen:  Bowel sounds positive; abdomen soft and non-tender without masses, organomegaly, or hernias noted. No hepatosplenomegaly. Msk:  Back normal, normal gait. Muscle strength and tone normal. Extremities:  Mild ankle edema Neurologic:  Alert and oriented x 3. Skin:  Intact without lesions or  rashes. Cervical Nodes:  no significant adenopathy Inguinal Nodes:  no significant adenopathy Psych:  Normal affect.   Detailed Cardiovascular Exam  Neck    Carotids: Carotids full and equal bilaterally without bruits.      Neck Veins: Normal, no JVD.    Heart    Inspection: no deformities or lifts noted.      Palpation: normal PMI with no thrills palpable.      Auscultation: 3/6 systolic murmur radiating out the outflow tract, mid peaking, s1 and s2 WNL, No S3, no diastolic murmur, irregular  Vascular    Abdominal Aorta: no palpable masses, pulsations, or audible bruits.      Femoral Pulses: normal femoral pulses bilaterally.      Pedal Pulses: normal pedal pulses bilaterally.      Radial Pulses: normal radial pulses bilaterally.      Peripheral Circulation: no clubbing, cyanosis, with normal capillary refill.     EKG  Procedure date:  08/02/2010  Findings:      atrial fibrillation, rate 95, left axis deviation, no acute ST-T wave changes  Impression & Recommendations:  Problem # 1:  ATRIAL FIBRILLATION (ICD-427.31) The patient tolerated this rhythm well. He tolerates Coumadin. No change in therapy is indicated. Orders: EKG w/ Interpretation (93000)  Problem # 2:  CHF (ICD-428.0) I will have him cut back his Demadex to 2 pills in the morning and one in the afternoon. If he swells, has increased dyspnea for 2 or 3 pound weight gain he will take the extra p.m. Demadex. I reviewed this at length with the patient and his daughter. This is preferable to increasing his fluid intake to try to bring his BUN and creatinine down.  Problem # 3:  HYPERTENSION (ICD-401.9) His blood pressure is well controlled and he will continue meds as listed.  Problem # 4:  AORTIC STENOSIS (ICD-424.1) It has been greater than 3 years since his last echocardiogram at which point he had moderate aortic stenosis. I will repeat this study. Orders: Echocardiogram (Echo)  Patient Instructions: 1)   Your physician recommends that you schedule a follow-up appointment in: 6 months with Dr Antoine Poche 2)  Your physician has requested that you have an echocardiogram.  Echocardiography is a painless test that uses sound waves to create images of your heart. It provides your doctor with information about the size and shape of your heart and how well your heart's chambers and valves are working.  This procedure takes approximately one hour. There are no restrictions for this procedure. 3)  Your physician has recommended you make the following change in your medication: Decrease Demadex to 2 tablets in the am and one in the pm.  You make take one extra daily if needed

## 2010-09-08 NOTE — Medication Information (Signed)
Summary: rov/tm  Anticoagulant Therapy  Managed by: Bethena Midget, RN, BSN Referring MD: Rollene Rotunda MD PCP: Dr. [name] MD: Graciela Husbands MD, Viviann Spare Indication 1: Atrial Fibrillation (ICD-427.31) Lab Used: [facility] Site: [facility] INR POC 2.3 INR RANGE 2 - 3  Dietary changes: no    Health status changes: no    Bleeding/hemorrhagic complications: no    Recent/future hospitalizations: no    Any changes in medication regimen? no    Recent/future dental: no  Any missed doses?: no       Is patient compliant with meds? yes       Allergies: 1)  ! Motrin  Anticoagulation Management History:      The patient is taking warfarin and comes in today for a routine follow up visit.  Positive risk factors for bleeding include an age of 75 years or older, history of CVA/TIA, and presence of serious comorbidities.  The bleeding index is 'high risk'.  Positive CHADS2 values include History of CHF, History of HTN, Age > 60 years old, and Prior Stroke/CVA/TIA.  The start date was 10/02/2003.  Anticoagulation responsible provider: Graciela Husbands MD, Viviann Spare.  INR POC: 2.3.  Cuvette Lot#: [id number].  Exp: 09/2011.    Anticoagulation Management Assessment/Plan:      The patient's current anticoagulation dose is Coumadin 5 mg tabs: as directed.  The target INR is 2 - 3.  The next INR is due 08/30/2010.  Anticoagulation instructions were given to patient.  Results were reviewed/authorized by Bethena Midget, RN, BSN.  He was notified by Bethena Midget, RN, BSN.         Prior Anticoagulation Instructions: INR 1.8 Today take extra 1/2 pill then resume 1pill everyday except 1/2 pill on Tuesdays and Thursdays. Recheck in 4 weeks.   Current Anticoagulation Instructions: INR 2.3 Continue 5mg  everyday except 2.5mg s on Tuesdays and Thursdays. Recheck in 4 weeks.

## 2010-09-08 NOTE — Progress Notes (Signed)
Summary: results labs  Phone Note Call from Patient   Caller: Patient Call For: nadel Summary of Call: pt says he has tried many times to retrieve lab results. requests this today. [phone] Initial call taken by: Tivis Ringer, CNA,  July 29, 2010 8:44 AM  Follow-up for Phone Call        Pt aware of results per SN.Reynaldo Minium CMA  July 29, 2010 9:36 AM

## 2010-09-08 NOTE — Assessment & Plan Note (Signed)
Summary: 6 months/apc   Primary Care Provider:  Dr. Kriste Basque  CC:  6 month ROV & review of mult medical problems....  History of Present Illness: 77 y/o BM here for a follow up visit... he has multiple medical problems including HBP, CAD, AS, AFib, & CHF followed by DrHochrein & the CHFclinic;  known PulmHTN & PA aneurysm conservatively managed by DrGearhardt;  Hyperchol;  RI & BPH;  DJD & Gout...   ~  January 17, 2010:  last visit we added Aldactone 25mg /d hoping to spare him some of the six KCl tabs he takes daily due to his Demadex requirement... this helped a little & he is down to 4 KCl per day but he developed gynecomastia & [name] switched him to Eplerenone 25mg ... he has had some diminution of the gynecomastia & tenderness- and still on 4 Demadex & 4 KCl (overall it hardly seems worth the Inspra cost to save 2KCl tabs per day- but he wants to continue & ask DrHochrein at his next follow up)... BP controlled on his now 6-7 meds! & due for f/u in the CHF clinic... he remains in AFib on coumadin... Chol controlled w/ Simva40... renal funct & PSA stable on labs within the last 58mo...    ~  July 18, 2010:  he is "hanging in there" w/ his mult problems at age 83... no ch in med regimen & daugh does his meds for him...  denies CP, palpit, SOB, edema, etc... he saw DrHochrein 6/11> chr DOE, no change, same meds... he states that DrGearhardt has "released me" & he's due for a f/u CXR (no change)... Lipids OK on Simva40;  renal function sl worse w/ diuretics but BNP=535;  otherw stable    Current Problem List:    OPHTHALMOLOGY - s/p cataract surg & followed for mild Glaucoma on gtts daily...  PULMONARY HYPERTENSION (ICD-416.8) - on home oxygen 2L/min "Prn"...  ~  last 2DEcho w/ est PAsys= 64... he has not had a right heart cath...  HYPERTENSION (ICD-401.9) - on METOPROLOL ER 100mg - 1/2 tab daily,  VERAPAMIL 240mg /d,  MICARDIS 80mg /d,  CLONIDINE 0.2mg - 1/2 Bid,  DEMADEX 20mg - 2Bid,   EPLERENONE 25mg /d, & K20- 4/d... he was prev on Minoxidil  but this was  discontinued by Cardiology... BP= 114/80 today and not checking at home, tolerating meds well... denies HA, visual changes, CP, palipit, dizziness, syncope... he has chr DOE without change and mild edema...  ~  6/10: DrHochrein added back the Clonidine & asked him, again, to monitor BP at home.  ~  12/10:  they requested trial to decr K20's from 6/d required due to his Demedex... suggest adding ALDACTONE 25mg /d & decr K20 to 4/d w/ f/u labs...  ~  6/11:  Aldactone caused gynecomastia & was switched to EPLERENONE 25mg /d by [name] w/ some improvment in side effect.  CHF (ICD-428.0) - see meds above... followed in the CHF Clinic by DrHochrein;  he has severe Diastolic CHF, w/ normal C's by cath in 2005, & normal LV systolic function w/ EF=75%... notes DOE- no change.  ~  labs 6/09 showed BNP= 761  ~  labs 9/09 showed BNP= 486  ~  labs 12/09 showed BNP = 568  ~  labs 6/10 showed BNP= 421  ~  labs 12/10 showed BNP= 577... adding SPIRONOLACTONE 25mg /d.  ~  labs 1/11 showed BNP= 395... later ch to EPLERENONE 25mg /d.  ~  labs 12/11 showed BNP= 535  AORTIC STENOSIS (ICD-424.1) - moderate by 2DEcho, repeat  2DEcho 1/09 hosp = similiar mod AS... followed by drHochrein.  ATRIAL FIBRILLATION (ICD-427.31) - on COUMADIN & followed in the Coumadin Clinic.  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  ~  CXR 1/09 showed unchanged right PA aneurysm, stable cardiomeg, elevated left hemidiaph w/ atx.  ~  CXR 6/10 showed no change, Ao tortuous and calcif, NAD.  ~  CXR 12/11 showed stable elv left hemidiaph, bibasilar atx/ scarring, cardiomeg, ectatic ao, right PA enlargement.  HYPERCHOLESTEROLEMIA (ICD-272.0) - now on LIPITOR 20mg /d (prev Simva40 changed due to concomitant Verap).  ~  FLP 1/09 on Simva40 showed TChol 95, TG 39, HDL 36, LDL 51  ~  FLP 12/09 on Simva40 showed  TChol 167, TG 68, HDL 64, LDL 90  ~  FLP 1/11 on Simva40 showed TChol 153, TG 101, HDL 50, LDL 83... later ch to Lip20   HEMORRHOIDS (ICD-455.6)  RENAL INSUFFICIENCY (ICD-588.9) - Creat=1.4 - 1.6 during 1/09 hosp...  ~  labs 6/09 & 9/09 showed Creat= 1.3  ~  labs 12/09 showed BUN= 20, Creat= 1.3, K= 4.0  ~  labs 6/10 showed BUN= 24, Creat= 1.3, K= 3.8  ~  labs 12/10 showed BUN= 12, Creat= 1.2, K= 3.6 (on 6 K20/d)  ~  labs 4/11 showed BUN= 18, Creat= 1.4, K= 4.0 (on 4 KCl/d)  ~  labs 12/11 showed BUN= 32, Creat= 1.8, K= 4.3  BENIGN PROSTATIC HYPERTROPHY, HX OF (ICD-V13.8) - followed by DrTannenbaum & taking FLOMAX 0.4mg /d... last seen 5/09 and his note is reviewed...  ~  labs 12/09 showed PSA= 1.42  ~  labs 2/11 showed PSA= 1.65  CVA (ICD-434.91) - Stroke in 1990 in the setting of HBP...  DEGENERATIVE JOINT DISEASE, GENERALIZED (ICD-715.00) - he uses OTC pain meds Prn...  GOUT (ICD-274.9) - on ALLOPURINOL 100mg /d,  & now off the Colchicine...  ~  labs 10/08 showed Uric= 10.3  ~  labs 12/09 showed Uric= 7.7.Marland Kitchen. he wants to continue same meds.  ~  labs 12/11 showed Uric = 6.4  INGUINAL HERNIA (ICD-550.90)  Health Maintenance - he receives the yearly seasonal Flu vaccines... f/u PNEUMOVAX at his request 2010...   Preventive Screening-Counseling & Management  Alcohol-Tobacco     Smoking Status: no tobacco use     Cigars/week: occasional cigar  Allergies: 1)  ! Motrin  Comments:  Nurse/Medical Assistant: The patient's medications and allergies were reviewed with the patient and were updated in the Medication and Allergy Lists.  Past History:  Past Medical History: PULMONARY HYPERTENSION (ICD-416.8) HYPERTENSION (ICD-401.9) CHF (ICD-428.0) AORTIC STENOSIS (ICD-424.1) ATRIAL FIBRILLATION (ICD-427.31) ANEURYSM OF PULMONARY ARTERY (ICD-417.1) HYPERCHOLESTEROLEMIA (ICD-272.0) HEMORRHOIDS (ICD-455.6) RENAL INSUFFICIENCY (ICD-588.9) BENIGN PROSTATIC HYPERTROPHY, HX OF  (ICD-V13.8) CVA (ICD-434.91) DEGENERATIVE JOINT DISEASE, GENERALIZED (ICD-715.00) GOUT (ICD-274.9) INGUINAL HERNIA (ICD-550.90)  Past Surgical History: Appendectomy Inguinal herniorrhaphy - rt side Cosmetic Eye Surgery Cataract Surgery  Family History: Reviewed history from 06/17/2008 and no changes required. Noncontributory for Early CAD  Social History: Reviewed history from 06/17/2008 and no changes required. Retired from [employer] Married No Tobacco except occasional cigars  Review of Systems      See HPI       The patient complains of decreased hearing, dyspnea on exertion, peripheral edema, and difficulty walking.  The patient denies anorexia, fever, weight loss, weight gain, vision loss, hoarseness, chest pain, syncope, prolonged cough, headaches, hemoptysis, abdominal pain, melena, hematochezia, severe indigestion/heartburn, hematuria, incontinence, muscle weakness, suspicious skin lesions, transient blindness,  depression, unusual weight change, abnormal bleeding, enlarged lymph nodes, and angioedema.    Vital Signs:  Patient profile:   77 year old male Height:      71 inches Weight:      213.38 pounds BMI:     29.87 O2 Sat:      96 % on Room air Temp:     97.0 degrees F oral Pulse rate:   84 / minute BP sitting:   114 / 80  (right arm) Cuff size:   regular  Vitals Entered By: Randell Loop CMA (July 18, 2010 1:59 PM)  O2 Sat at Rest %:  96 O2 Flow:  Room air CC: 6 month ROV & review of mult medical problems... Is Patient Diabetic? No Pain Assessment Patient in pain? no      Comments meds updated today with pt   Physical Exam  Additional Exam:  WD, WN, 77 y/o BM in NAD... GENERAL:  Alert & oriented; pleasant & cooperative... HEENT:  [place]/AT, EOM-wnl, PERRLA, EACs-clear, TMs-wnl, NOSE-clear, THROAT-clear & wnl. NECK:  Supple w/ fairROM; no JVD; sl decr carotid impulses w/ transmit murmur; no thyromegaly or nodules palpated; no  lymphadenopathy. CHEST:  Clear to P & A; without wheezes/ rales/ or rhonchi heard... HEART:  sl irreg, gr 2/6 AS murmur at base, without rubs or gallops apprec... ABDOMEN:  Soft & nontender; normal bowel sounds; no organomegaly or masses detected. EXT: without deformities, mild arthritic changes; no varicose veins/ +venous insuffic/ tr edema. NEURO:  CN's intact; no focal neuro deficits... DERM:  No lesions noted; no rash etc...    MISC. Report  Procedure date:  07/18/2010  Findings:      DATA REVIEWED:  ~  CXR >>  ~  EKG >>     All reviewed w/ the pt.  ~ FASTING labs >>   Impression & Recommendations:  Problem # 1:  CARDIAC>>> He has HBP, CAD, AS, AFib, CHF >> all followed by DrHochrein & the CHF clinic on 6 meds + KCl... stable overall but sl incr Creat to 1.8, w/ BNP of 535, etc... continue same meds for now w/ regular f/u in CHF clinic by DrHochrein et al...  Problem # 2:  ANEURYSM OF PULMONARY ARTERY (ICD-417.1) This remains stable both clinically & roentgenographically... Orders: T-2 View CXR (71020TC)  Problem # 3:  HYPERCHOLESTEROLEMIA (ICD-272.0) He is tol the Lip20 well, but not fasting today for FLP check... His updated medication list for this problem includes:    Lipitor 20 Mg Tabs (Atorvastatin calcium) .Marland Kitchen... Take one tablet by mouth daily.  Problem # 4:  RENAL INSUFFICIENCY (ICD-588.9) His Creat is up to 1.8 on his current eds and diuretics... reminded to stay well hydrated... continue same meds for now, may need to slowly decr the diuretic Rx. Orders: TLB-BMP (Basic Metabolic Panel-BMET) (80048-METABOL) TLB-CBC Platelet - w/Differential (85025-CBCD) TLB-TSH (Thyroid Stimulating Hormone) (84443-TSH) TLB-BNP (B-Natriuretic Peptide) (83880-BNPR) TLB-Uric Acid, Blood (84550-URIC)  Problem # 5:  GOUT (ICD-274.9) He has DJD, gout & stable on current regimen... His updated medication list for this problem includes:    Allopurinol 100 Mg Tabs (Allopurinol)  .Marland Kitchen... 1 tab by mouth once daily for gout prevention...  Problem # 6:  OTHER MEDICAL PROBLEMS AS NOTED>>>  Complete Medication List: 1)  Lumigan 0.03 % Soln (Bimatoprost) .Marland Kitchen.. 1 drop each eye at bedtime 2)  Oxygen  .... 2 liters as needed 3)  Coumadin 5 Mg Tabs (Warfarin sodium) .... As directed 4)  Metoprolol Succinate 100 Mg  Xr24h-tab (Metoprolol succinate) .... Take 1/2 tab by mouth once daily.... 5)  Verapamil Hcl Cr 240 Mg Tbcr (Verapamil hcl) .... Take 1 tablet by mouth once a day 6)  Micardis 80 Mg Tabs (Telmisartan) .... Take 1 tablet by mouth once a day 7)  Clonidine Hcl 0.2 Mg Tabs (Clonidine hcl) .... Take one half by mouth two times a day 8)  Demadex 20 Mg Tabs (Torsemide) .... Take 2 tablets two times a day 9)  Eplerenone 25 Mg Tabs (Eplerenone) .... Take 1  daily 10)  Klor-con M20 20 Meq Tbcr (Potassium chloride crys cr) .... Take 2 tabs by mouth two times a day as directed 11)  Lipitor 20 Mg Tabs (Atorvastatin calcium) .... Take one tablet by mouth daily. 12)  Flomax 0.4 Mg Cp24 (Tamsulosin hcl) .... Take 1 tab daily at bedtime... 13)  Allopurinol 100 Mg Tabs (Allopurinol) .Marland Kitchen.. 1 tab by mouth once daily for gout prevention... 14)  Vitamin D 2000 Unit Tabs (Cholecalciferol) .Marland Kitchen.. 1 every am 15)  Meclizine Hcl 25 Mg Tabs (Meclizine hcl) .... 1/2-1 by mouth three times a day as needed dizziness  Patient Instructions: 1)  Today we updated your med list- see below.... 2)  Continue your current meds the same... 3)  today we did your follow up blood work & CXR... please call the "phone tree" in a few days for your lab results.Marland KitchenMarland Kitchen  4)  Call for any problems.Marland KitchenMarland Kitchen 5)  Please schedule a follow-up appointment in 6 months.

## 2010-09-08 NOTE — Progress Notes (Signed)
Summary: refill request   Phone Note Refill Request Message from:  Patient on August 09, 2010 2:21 PM  Refills Requested: Medication #1:  DEMADEX 20 MG  TABS take 2 tabs in the AM and one in the pm  Method Requested: Telephone to Pharmacy Initial call taken by: Glynda Jaeger,  August 09, 2010 2:22 PM  Follow-up for Phone Call        Rx sent in to pharmacy. Pt notified. Marrion Coy, CNA  August 09, 2010 2:58 PM  Follow-up by: Marrion Coy, CNA,  August 09, 2010 2:58 PM

## 2010-09-26 DIAGNOSIS — I4891 Unspecified atrial fibrillation: Secondary | ICD-10-CM

## 2010-09-26 DIAGNOSIS — Z7901 Long term (current) use of anticoagulants: Secondary | ICD-10-CM

## 2010-09-26 DIAGNOSIS — I6350 Cerebral infarction due to unspecified occlusion or stenosis of unspecified cerebral artery: Secondary | ICD-10-CM

## 2010-09-27 ENCOUNTER — Encounter: Payer: Self-pay | Admitting: Cardiovascular Disease

## 2010-09-27 ENCOUNTER — Encounter (INDEPENDENT_AMBULATORY_CARE_PROVIDER_SITE_OTHER): Payer: Medicare Other

## 2010-09-27 DIAGNOSIS — I4891 Unspecified atrial fibrillation: Secondary | ICD-10-CM

## 2010-09-27 DIAGNOSIS — Z7901 Long term (current) use of anticoagulants: Secondary | ICD-10-CM

## 2010-09-27 LAB — CONVERTED CEMR LAB: POC INR: 3.1

## 2010-10-04 NOTE — Medication Information (Signed)
Summary: [name] [name]   Anticoagulant Therapy  Managed by: Tammy Sours, PharmD Referring MD: Rollene Rotunda MD PCP: Dr. [name] MD: Excell Seltzer MD, Casimiro Needle Indication 1: Atrial Fibrillation (ICD-427.31) Lab Used: [facility] Site: [facility] INR POC 3.1 INR RANGE 2 - 3  Dietary changes: yes       Details: not much greens this week or last week.   Health status changes: no    Bleeding/hemorrhagic complications: no    Recent/future hospitalizations: no    Any changes in medication regimen? no    Recent/future dental: no  Any missed doses?: no       Is patient compliant with meds? yes      Comments: Recommended patient to come back in 3 weeks. Patient insisted on coming back in 4 weeks.   Allergies: 1)  ! Motrin  Anticoagulation Management History:      The patient is taking warfarin and comes in today for a routine follow up visit.  Positive risk factors for bleeding include an age of 75 years or older, history of CVA/TIA, and presence of serious comorbidities.  The bleeding index is 'high risk'.  Positive CHADS2 values include History of CHF, History of HTN, Age > 49 years old, and Prior Stroke/CVA/TIA.  The start date was 10/02/2003.  Anticoagulation responsible provider: Excell Seltzer MD, Casimiro Needle.  INR POC: 3.1.  Cuvette Lot#: [id number].  Exp: 08/2011.    Anticoagulation Management Assessment/Plan:      The patient's current anticoagulation dose is Coumadin 5 mg tabs: as directed.  The target INR is 2 - 3.  The next INR is due 10/25/2010.  Anticoagulation instructions were given to patient.  Results were reviewed/authorized by Tammy Sours, PharmD.         Prior Anticoagulation Instructions: INR 2.7 Continue 1 tablet everyday except 1/2 tablet on Tuesdays and Thursdays. Recheck in 4 weeks.   Current Anticoagulation Instructions: INR 3.1  Skip tomorrow's dose of Coumadin. Then resume taking 1 tablet everyday except 1/2 tablet on Tuesdays and Thursdays. Recheck INR in 4  weeks.

## 2010-10-25 ENCOUNTER — Ambulatory Visit (INDEPENDENT_AMBULATORY_CARE_PROVIDER_SITE_OTHER): Payer: Medicare Other | Admitting: *Deleted

## 2010-10-25 DIAGNOSIS — I6350 Cerebral infarction due to unspecified occlusion or stenosis of unspecified cerebral artery: Secondary | ICD-10-CM

## 2010-10-25 DIAGNOSIS — Z7901 Long term (current) use of anticoagulants: Secondary | ICD-10-CM

## 2010-10-25 DIAGNOSIS — I4891 Unspecified atrial fibrillation: Secondary | ICD-10-CM

## 2010-10-25 LAB — POCT INR: INR: 2.3

## 2010-10-25 NOTE — Patient Instructions (Signed)
INR 2.3 Continue taking 1 tablet (5 mg) daily, except take 1/2 (2.5mg ) tablet on Tuesdays and Thursdays. Recheck in 4 weeks.

## 2010-11-08 ENCOUNTER — Other Ambulatory Visit: Payer: Self-pay | Admitting: Cardiology

## 2010-11-22 ENCOUNTER — Ambulatory Visit (INDEPENDENT_AMBULATORY_CARE_PROVIDER_SITE_OTHER): Payer: Medicare Other | Admitting: *Deleted

## 2010-11-22 DIAGNOSIS — I4891 Unspecified atrial fibrillation: Secondary | ICD-10-CM

## 2010-11-22 DIAGNOSIS — I6350 Cerebral infarction due to unspecified occlusion or stenosis of unspecified cerebral artery: Secondary | ICD-10-CM

## 2010-11-22 DIAGNOSIS — Z7901 Long term (current) use of anticoagulants: Secondary | ICD-10-CM

## 2010-12-01 ENCOUNTER — Other Ambulatory Visit: Payer: Self-pay | Admitting: Cardiology

## 2010-12-01 ENCOUNTER — Other Ambulatory Visit: Payer: Self-pay | Admitting: Pulmonary Disease

## 2010-12-02 ENCOUNTER — Other Ambulatory Visit: Payer: Self-pay | Admitting: *Deleted

## 2010-12-02 MED ORDER — CLONIDINE HCL 0.2 MG PO TABS: ORAL_TABLET | ORAL | Status: DC

## 2010-12-12 ENCOUNTER — Other Ambulatory Visit: Payer: Self-pay | Admitting: Pulmonary Disease

## 2010-12-16 ENCOUNTER — Telehealth: Payer: Self-pay | Admitting: Pulmonary Disease

## 2010-12-16 MED ORDER — ALLOPURINOL 100 MG PO TABS: 100.0000 mg | ORAL_TABLET | Freq: Every day | ORAL | Status: DC

## 2010-12-16 NOTE — Telephone Encounter (Signed)
[name] [name] is aware rx was sent to pharmacy. Nothing further was needed

## 2010-12-20 ENCOUNTER — Encounter: Payer: Medicare Other | Admitting: *Deleted

## 2010-12-20 NOTE — Assessment & Plan Note (Signed)
[name] [name]                            CARDIOLOGY OFFICE NOTE   NAME:[name], [name] ARCH                   MRN:          [id number]  DATE:05/26/2008                            DOB:          [birth year]    PRIMARY CARE PHYSICIAN:  [name], MD   REASON FOR PRESENTATION:  The patient with heart failure with well-  preserved ejection fraction.   HISTORY OF PRESENT ILLNESS:  The patient is a pleasant 77 year old  gentleman with the above history.  He has been seeing Dorian Pod  who is in the Heart Failure Clinic.  He has done quite well since we  last saw him.  He has had no new shortness of breath, PND, or orthopnea.  He has had no palpitation, presyncope, or syncope.  He does a little bit  of walking down his street everyday.   Of note at the last appointment, his blood pressure was quite elevated.  However, it turns out he probably had missed some of his medications.  His daughter is sure that he has been taking all of these and his blood  pressure is well controlled.   PAST MEDICAL HISTORY:  1. Congestive heart failure with well-preserved ejection fraction      (normal coronary arteries with catheterization in the past.  Normal      EF).  2. Hypertension.  3. Paroxysmal atrial fibrillation (appears to be permanent now).  4. Pulmonary hypertension.  5. Moderate aortic stenosis.  6. Pulmonary artery aneurysm 3-cm followed several years by Dr.      Tyrone Sage and stable.  7. Dyslipidemia.  8. History of alcohol and tobacco use.  9. Gout.  10.Hypokalemia in the past.  11.Gastrointestinal bleeding.   ALLERGIES:  Intolerant to Motrin.   MEDICATIONS:  1. Toprol 50 mg daily.  2. Simvastatin 40 mg daily.  3. Verapamil 240 mg daily.  4. Coumadin.  5. Flomax.  6. Clonidine 0.1 mg b.i.d.  7. Allopurinol.  8. Demadex 20 mg b.i.d.  9. Micardis 80 mg daily.  10.Potassium 20 mEq q.a.m., 40 mEq q. noon, and 40 mEq q.p.m.  11.Vitamin D.   REVIEW OF SYSTEMS:  As stated in the HPI and otherwise negative for  other systems.   PHYSICAL EXAMINATION:  GENERAL:  The patient is in no distress.  VITAL SIGNS:  Blood pressure 118/80, heart rate 60 and regular, weight  200.  HEENT:  Eyelids unremarkable.  Pupils equal, round, and reactive to  light.  Fundi not visualized.  Oral mucosa unremarkable.  NECK:  No jugular venous distention at 45 degrees.  Carotid upstroke  brisk and symmetric.  No bruits or thyromegaly.  LYMPHATICS:  No cervical, axillary, or inguinal adenopathy.  LUNGS:  Clear to auscultation bilaterally.  BACK:  No costovertebral angle tenderness.  CHEST:  Unremarkable.  HEART:  PMI not displaced or sustained.  S1 and S2 within normal limits.  No S3 or S4.  No clicks, rubs, or murmurs.  ABDOMEN:  Mildly obese, positive bowel sounds, normal in frequency and  pitch.  No bruits, rebound, or guarding.  No midline pulsatile mass.  No  organomegaly.  SKIN:  No rashes.  No nodules.  EXTREMITIES:  Pulses 2+, trace lower extremity edema.  NEURO:  Grossly intact.   ASSESSMENT AND PLAN:  1. Diastolic heart failure.  The patient is doing well with respect to      this.  He has salt restriction.  His blood pressure is well      controlled.  He seems to be euvolemic.  He had a stable renal      function when he was last checked.  He will continue with the meds      as listed.  2. Hypertension.  Blood pressure is well controlled.  My plan is to      try to back off on his clonidine when he comes back if his blood      pressure remains as well as it is today.  I think he is having some      dry mouth related to this.  3. Atrial fibrillation.  He is tolerating this.  He is denying any      symptoms and he will continue his chronic anticoagulation and rate      control.  4. Followup.  We will see him back in 3 months or sooner if needed.     Rollene Rotunda, MD, [facility]  Electronically Signed    JH/MedQ  DD: 05/26/2008  DT:  05/27/2008  Job #: [id number]   cc:   [name], MD

## 2010-12-20 NOTE — Assessment & Plan Note (Signed)
[facility]                            CARDIOLOGY OFFICE NOTE   NAME:[name] [name], [name] [name]                   MRN:          [id number]  DATE:03/19/2007                            DOB:          Mar 16, 1926    PRIMARY CARE PHYSICIAN:  Dr. Alroy Dust.   REASON FOR VISIT:  Evaluate patient with diastolic heart failure and  pulmonary hypertension.   HISTORY OF PRESENT ILLNESS:  The patient is a 77 year old gentleman who  was previously taken care of by Dr. Samule Ohm. He has a history of  pulmonary hypertension with a workup in the past suggesting that this is  possibly related to severe diastolic dysfunction. He has had right-sided  heart failure related to this. At the last visit with Dr. Samule Ohm, he had  had some improvement in lower extremity edema with an increased dose of  Demodex. He had been put on p.r.n. oxygen by Dr. Kriste Basque.   Today he says that he is having increased edema and reports now he has  edema up to his scrotum. He has had slow and steady weight gain which  appears to be about 9 pounds since his last visit. He says he does not  think he pees a lot with the Demodex. He has baseline dyspnea and wears  the oxygen as needed. He has not had any acute exacerbations of this  with no new PND or orthopnea. He does not notice palpitations, has not  had any presyncope or syncope. He is not having any chest discomfort.   PAST MEDICAL HISTORY:  Pulmonary hypertension, diastolic heart failure  with an ejection fraction of 75%, normal coronary arteries and  catheterization in 2005, hypertension, paroxysmal atrial fibrillation  (evident on recent EKGs and today in the office), moderate aortic  stenosis, 3-cm pulmonary artery aneurysm that was previously followed by  Dr. Tyrone Sage but stable over a number of years, dyslipidemia, status  post appendectomy for gangrenous appendicitis.   ALLERGIES:  MOTRIN.   MEDICATIONS:  1. Avapro 300 mg daily.  2.  Metoprolol 100 mg daily.  3. Simvastatin 40 mg daily.  4. Multivitamin.  5. Minoxidil 10 mg daily.  6. Verapamil 240 mg daily.  7. Coumadin.  8. K-Dur 40 mEq q.a.m. and 20 mEq q.p.m.  9. Flomax 0.4 mg daily.  10.Torsemide 60 mEq q.a.m. and 20 mEq q.p.m.  11.Aspirin 325 mg daily.   REVIEW OF SYSTEMS:  As stated in the HPI and otherwise negative for  other systems.   PHYSICAL EXAMINATION:  The patient is in no acute distress.  Blood pressure 131/79, heart rate 63 and irregular, weight 214 pounds,  body mass index 28.  HEENT:  Eyelids unremarkable. Pupils equal round and reactive to light.  Fundi not visualized. Oral mucosa unremarkable.  NECK:  Jugular venous distention to the angle of the jaw at 45 degrees,  prominent CV wave, carotid upstroke brisk and symmetric, no bruits, no  thyromegaly.  LYMPHATICS:  No cervical, axillary or inguinal adenopathy.  LUNGS:  Decreased breath sounds with few coarse crackles, no wheezing.  BACK:  No costovertebral angle tenderness.  CHEST:  Unremarkable.  HEART:  PMI not displaced or sustained, S1 and S2 within normal limits,  no S3, 2/6 apical systolic murmur radiating out the aortic outflow tract  and early to mid peaking. No diastolic murmurs.  ABDOMEN:  Flat, positive bowel sounds, normal in frequency and pitch, no  bruits, no rebound, no guarding, no midline pulsatile mass, no  hepatomegaly, no splenomegaly.  SKIN:  No rashes, no nodules.  EXTREMITIES:  2+ pulses upper, 1+ dorsalis pedis bilaterally, moderate  to severe lower extremity edema to mid thigh.  NEUROLOGIC:  Oriented to person, place and time. Cranial nerves II-XII  grossly intact. Motor grossly intact.   ASSESSMENT/PLAN:  1. Anasarca. The patient has significant volume overload. He has      gained 9 pounds since his last visit. At this point, I am going to      give him a three-day course of Zaroxolyn 2.5 mg daily. I have given      him some paperwork on a low salt diet. He is  going to increase his      potassium by 40 mEq a day while he is taking the Zaroxolyn. He is      then going to come back early next week and will need a BMET. He      will need some further dose adjustments and frequent followup to      try to get rid of this edema.  2. Pulmonary hypertension. He has had a workup in the past and none is      indicated further.  3. Hypertension. His blood pressure is actually controlled on the      medications as listed and I would not reduce these as he requests.  4. Atrial fibrillation. This has been reported as paroxysmal in the      past. In sinus he has been bradycardic. However, it looks like he      was in A fib the last time he had an EKG done here the previous      notes. He is in atrial fibrillation now. I am going to have to try      to determine whether this is now persistent atrial fibrillation and      whether he needs another attempt at cardioversion in the future.  5. Followup as above.     Rollene Rotunda, MD, [facility]  Electronically Signed    JH/MedQ  DD: 03/19/2007  DT: 03/20/2007  Job #: [id number]

## 2010-12-20 NOTE — Assessment & Plan Note (Signed)
[facility]                            CARDIOLOGY OFFICE NOTE   NAME:[name] [name], [name] [name]                   MRN:          [id number]  DATE:09/27/2007                            DOB:          1925/09/18    PRIMARY:  Dr. Kriste Basque.   REASON FOR PRESENTATION:  Evaluate patient with diastolic heart failure.   HISTORY OF PRESENT ILLNESS:  The patient presents for followup after  hospitalization in late January.  He presented with increased dyspnea  and volume overload.  He has a well-preserved ejection fraction; this  was verified with an echocardiogram during that admission.  He was felt  to have some pulmonary component as well and apparently was seen by Dr.  Delton Coombes.  He was scheduled for outpatient pulmonary function testing,  which Dr. Kriste Basque is arranging.  He was sent home on the Xopenex.  He was  sent home on a flutter valve.  He has had no new shortness of breath and  thinks his breathing is much improved.  He does use oxygen p.r.n. if he  is doing stuff.  He denies any resting shortness of breath and has no  PND or orthopnea.  He has no chest discomfort, neck or arm discomfort.  He is avoiding salt.  He is not weighing himself daily.  He is not,  however, having any increasing edema.  He has had no chest pain,  palpitations, presyncope or syncope.   PAST MEDICAL HISTORY:  1. Diastolic heart failure, normal coronary arteries at cardiac      catheterization.  2. Hypertension.  3. Persistent atrial fibrillation.  4. Chronic anticoagulation with Coumadin therapy.  5. Pulmonary hypertension, moderate aortic stenosis.  6. 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 Tyrone Sage in the past.  7. Dyslipidemia.  8. Occasional cigar smoking.  9. Gout.  10.GI bleeding, followed by Dr. Jarold Motto.  11.Benign prostatic hypertrophy.   ALLERGIES:  MOTRIN causes blood pressure to drop.   MEDICATIONS:  1.  Avapro 300 mg daily.  2. Metoprolol 50 mg daily.  3. Simvastatin 40 mg daily.  4. Multivitamin.  5. Verapamil 240 mg daily.  6. Coumadin.  7. Flomax.  8. Oxygen.  9. K-Dur 40 mEq t.i.d.  10.Demadex 40 mg b.i.d.  11.Allopurinol 100 mg daily.  12.Antivert.  13.Tylenol.  14.Lipitor 20 mg daily.   REVIEW OF SYSTEMS:  As stated in the HPI and otherwise negative for  other systems.   PHYSICAL EXAMINATION:  The patient is in no distress.  Blood pressure 157/89, heart rate 80 and regular, weight 180 pounds,  body mass index 25.  HEENT:  Eyelids unremarkable; pupils equal, round, and reactive to  light; fundi not visualized.  Oral mucosa normal.  NECK:  No jugular distention at 45 degrees; carotid upstroke brisk and  symmetric, no bruits; no thyromegaly.  LYMPHATICS:  No cervical, axillary or inguinal adenopathy.  LUNGS:  Clear to auscultation bilaterally.  BACK:  No costovertebral tenderness.  CHEST:  Unremarkable.  HEART:  PMI not displaced or sustained, S1-S2 within normal limits, no  S3, no S4, no clicks, no rubs, no murmurs.  A 2/6 systolic heart murmur  radiating out the aortic outflow tract, no diastolic murmurs.  ABDOMEN:  Flat; positive bowel sounds, normal in frequency and pitch; no  bruits, no rebound, no guarding, no midline pulsatile mass, no  hepatomegaly, no splenomegaly.  SKIN:  No rashes, no nodules.  EXTREMITIES:  Pulses2+, no edema, no cyanosis, no clubbing.  NEUROLOGIC:  Oriented to person, place and time; cranial nerves II-XII  grossly intact; motor grossly intact.   EKG:  Atrial fibrillation, rate 82, left anterior fascicular block, no  acute ST-T wave changes.   ASSESSMENT AND PLAN:  1. Diastolic heart failure.  The patient seems to be euvolemic at this      point.  No further cardiovascular testing is suggested.  He will      continue on medications as listed.  2. Hypertension.  He was taken off of minoxidil in the hospital.  This      was probably for  the issues of volume retention that can be      associated minoxidil.  Also, his blood pressure may have been      running low.  I think he is going to need something more for blood      pressure and so I am going to add clonidine 0.1 mg b.i.d.  He will      let me know if he has any problems taking this.  3. Atrial fibrillation:  He will remain on the Coumadin.  He seems to      have reasonable rate control.  4. Aortic stenosis is moderate and we will manage this clinically.  5. Followup:  I will see the patient back in about 3 months or sooner      if needed.  Rollene Rotunda, MD, [facility]  Electronically Signed    JH/MedQ  DD: 09/27/2007  DT: 09/29/2007  Job #: [id number]   cc:   [name], MD

## 2010-12-20 NOTE — Discharge Summary (Signed)
NAME:  JEFFRIESJiro, Kiester NO.:  [id number]   MEDICAL RECORD NO.:  [id number]          PATIENT TYPE:  INP   LOCATION:  3735                         FACILITY:  [facility]   PHYSICIAN:  Rollene Rotunda, MD, FACCDATE OF BIRTH:  [birth year]   DATE OF ADMISSION:  08/29/2007  DATE OF DISCHARGE:  09/04/2007                               DISCHARGE SUMMARY   PRIMARY CARDIOLOGIST:  Rollene Rotunda, MD, [facility]   PRIMARY CARE PHYSICIAN:  [name], MD   DISCHARGE DIAGNOSES:  1. Acute/chronic diastolic heart failure.  2. Chronic obstructive pulmonary disease.  3. Hypertension.  4. Aortic stenosis.  5. Chronic atrial fibrillation.  6. Anticoagulation therapy.   HISTORY OF PRESENT ILLNESS:  Mr. Saleeby is a pleasant 77 year old  African-American gentleman who was seen in the Heart Failure Clinic on  day of admission complaining of increased dyspnea with increased O2  dependency for the last 2 weeks.  The patient was felt to be in mild  volume overload at this time with increased shortness of breath,  orthopnea. Was admitted for IV diuresis.  Pharmacy was asked to regulate  anticoagulation therapy.  Lab work obtained showing an H and H 10.7,  33.2.  BUN and creatinine 17/1.36.  INR therapeutic at 2.3, TSH 1.859.  The patient responded well to IV diuretic therapy.  The patient also was  negative cardiac enzymes.  The patient remained in atrial fibrillation  controlled rate.  A 2-D echocardiogram was also repeated.  Noted AS  moderate by echocardiogram.  The patient became hypokalemic with a  potassium of 2.8. Oral potassium supplements increased.  The patient  stated improvement in shortness of breath and orthopnea.  Pulmonary  asked to evaluate the patient for COPD management.  The patient was  evaluated on September 04, 2007 by Dr.  Delton Coombes. The patient started on  Xopenex inhaler.  Recommended to continue O2 at home.  The patient will  need full PFTs to quantify restriction in the  setting of elevated left  hemidiaphragm certainly contributing to restriction.  The patient stable  to be discharged home to followup with pulmonary, Dr. Kriste Basque. Will  initiate flutter valve and Xopenex inhalers prior to discharge per  respiratory therapy teaching.  The patient to have outpatient pulmonary  function test at [facility]  including spirometry, pre and post bronchodilatation, lung volumes, and  diffusion capacity.  The patient seen by Dr. Antoine Poche on day of  discharge.  Potassium 4.2,   Dictation ended at this point.      Dorian Pod, ACNP      Rollene Rotunda, MD, [facility]  Electronically Signed    MB/MEDQ  D:  09/04/2007  T:  09/05/2007  Job:  [id number]   cc:   [name], MD

## 2010-12-20 NOTE — Assessment & Plan Note (Signed)
[facility]                          CHRONIC HEART FAILURE NOTE   NAME:[name] [name], [name] [name]                   MRN:          [id number]  DATE:11/04/2007                            DOB:          31-Mar-1926    PRIMARY CARDIOLOGIST:  Rollene Rotunda, MD.   PRIMARY CARE:  [name], MD   [name] [name] returns today for follow-up of his congestive heart  failure, which is diastolic in nature with EF currently 75% by  echocardiogram/  [name] [name] has not been seen here in the heart  failure clinic since January, at which time I found him to be in volume  overload and he was admitted to [facility] for increased shortness of  breath.  During hospitalization he was diuresed, started on Xopenex  inhalers.  His minoxidil was discontinued secondary to possible  contribution to fluid retention and he was discharged home in stable  condition.  Since that time he has followed up with Dr. Antoine Poche on  February 20.  He was found to be hypertensive at that time and clonidine  0.1 mg b.i.d. was initiated.  It was felt that [name] [name]' aortic  stenosis was stable along with his heart failure status at that time.  [name] [name] has also seen Dr. Alroy Dust since that visit.  Underwent  pulmonary function study, results of which are not available at this  time and Dr. Kriste Basque asked the patient to follow up with him in 1 month,  but [name] [name] states he has not made that appointment.  There was  also mention of the patient following up with Dr. Delton Coombes in the pulmonary  hypertension clinic.  [name] [name] states he has been doing well over  the last few weeks.  Denies any lightheadedness, syncope or presyncopal  episodes.  Denies any palpitations.  States his breathing has been  stable. Has rarely used his oxygen.  Sleep has been adequate.  He  complains of some blurred vision and oral dryness with the clonidine but  states he also has a cataract that needs to be  evaluated, and I  encouraged him to follow up with that appointment.  Overall, he has  remained stable.   PAST MEDICAL HISTORY:  1. Congestive heart failure secondary to diastolic dysfunction with EF      75%.  2. Normal coronaries status post cardiac catheterization.  3. Hypertension.  4. Paroxysmal atrial fibrillation.  5. Chronic anticoagulation therapy with Coumadin.  6. Pulmonary hypertension.  7. Moderate AS.  8. 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 Tyrone Sage in the past.  9. Dyslipidemia.  10.Ongoing tobacco.  11.Gout.  12.History of GI bleed, followed by Dr. Jarold Motto.  13.History of BPH.  14.History of hypokalemia secondary to patient noncompliance with      potassium supplements.   REVIEW OF SYSTEMS:  As stated above, otherwise negative.   CURRENT MEDICATIONS:  1. Toprol XL 50 mg daily.  2. Simvastatin 40 mg daily.  3. Verapamil 240 mg daily.  4. Coumadin as directed.  5. Flomax  0.4 mg nightly.  6. O2 2 L p.r.n.  7. Antivert p.r.n.  8. Tylenol p.r.n.  9. Clonidine 0.1 mg b.i.d.  10.Allopurinol 100 mg daily.  11.Demadex 20 mg b.i.d.  12.Klor-Con 20 mEq 2 tablets t.i.d.  13.Micardis 80 mg daily (this has been substituted for Avapro 300 mg      daily secondary to cost per the patient's daughter).   PHYSICAL EXAM:  Weight 181 pounds.  Weight has been stable.  Blood  pressure 91/68 with a heart rate of 73.  [name] [name] is in no acute distress.  He has no signs of jugular vein  distention at a 45-degree angle.  LUNGS:  Clear to auscultation bilaterally.  CARDIOVASCULAR:  An S1and S2, regular rate and rhythm, a 2/6 systolic  ejection murmur noted.  ABDOMEN:  Soft, nontender, positive bowel sounds.  LOWER EXTREMITIES:  Without clubbing, cyanosis or edema.  NEUROLOGIC:  Alert and oriented x3.  Ambulating with assistance of a  cane.   IMPRESSION:  Congestive heart failure secondary to diastolic dysfunction   without signs of volume overload at this time.  The patient is mildly  hypotensive but asymptomatic, most likely secondary to the recent  addition of clonidine.  We will continue his current blood pressure  medicines.  I am going to put the patient on 24-hour blood pressure  monitor at home to track his blood pressure readings.  May need to make  adjustments in his medications if he remains too hypotensive.  The  patient also encouraged to follow up with Dr. Kriste Basque for review of his  pulmonary function study and possibly Dr. Delton Coombes with the pulmonary  hypertension, and I will  see [name] [name] back in 4 weeks.  I will call him at home and review  his blood pressure readings with him if I need to make changes now.      Dorian Pod, ACNP  Electronically Signed      Rollene Rotunda, MD, [facility]  Electronically Signed   MB/MedQ  DD: 11/04/2007  DT: 11/04/2007  Job #: [id number]   cc:   [name], MD

## 2010-12-20 NOTE — Assessment & Plan Note (Signed)
[name]                          CHRONIC HEART FAILURE NOTE   NAME:[name] [name], [name] [name]                   MRN:          [id number]  DATE:03/28/2007                            DOB:          1926/04/21    PRIMARY CARDIOLOGIST:  Dr. Rollene Rotunda.   PRIMARY CARE PHYSICIAN:  Dr. Alroy Dust.   [name] [name] is new to the heart failure clinic.  He is here to  establish care today and followup on a recent visit with Dr. Antoine Poche.  [name] [name] is a 77 year old African-American gentleman who just  recently established care with Dr. Antoine Poche after previously being  followed by Dr. Samule Ohm.  He has a history of pulmonary hypertension with  a workup in the past suggesting that this is possibly related to severe  diastolic dysfunction with right-sided heart failure.  When the patient  was seen by Dr. Antoine Poche earlier this month, he was found to have  increased edema, including scrotal edema.  Adjustments were made in  patient's medication.  The Demadex at that time was 60 mg in the a.m.  and 40 in the evening, and he was started on Zaroxolyn 2.5 mg for 3  days.  The patient states this seemed to resolve the lower extremity  edema he has accumulated.  [name] [name] is a pleasant gentleman.  He  lives here in [place] at home with his wife.  He previously worked in  [employer]ail [employer], is now retired, states he has insurance and is able to  financially afford his medications at this time.  He does assist in some  very light housekeeping at home, states he occasionally takes a walk,  maybe half a block or so, just for light exercise.  No dyspnea on  exertion with that activity.  He has also been trying very hard to  decrease the sodium in his diet.  He and his wife both have health  problems so they have been very diligent in making dietary changes.  He  states they try and find substitutes for salt, for example, [employer],  lemon juice, etc.  [name] [name] states he  is pleased with his quality of  life at the present time.  He is able to get out and about without  extreme difficulty, although he does have a handicap parking sticker,  and states the short distance from the parking lot to entrance to the  grocery store, shops, etc., is without difficulty.  He does become  dyspneic on exertion with steps or incline.  He denies any palpitations  or chest discomfort, states compliance with his medications, follows up  with Shelby Dubin in the Coumadin clinic regarding his anticoagulation  therapy.  He denies any current ETOH use, although he continues to smoke  a cigar.  He states the last one he had was approximately 3 weeks ago,  rather sporadic in his nicotine habit.   PAST MEDICAL HISTORY:  1. Diastolic heart failure with an ejection fraction of 75%.  2. Normal coronaries, status post cardiac catheterization in 2005.  3. Hypertension.  4. Paroxysmal atrial fibrillation.  5. Chronic anticoagulation therapy.  6. Pulmonary hypertension.  7. Moderate aortic stenosis.  8. A 3-cm pulmonary artery aneurysm, previously followed by Dr.      Tyrone Sage, that has been stable for the last few years.  9. Dyslipidemia.  10.Status post appendectomy.  11.Ongoing tobacco use.  12.Gout.  13.History of gastrointestinal bleed, evaluated by Dr. Jarold Motto in      the remote past.  14.Status post colonoscopy showing large external hemorrhoids.  15.History of benign prostatic hypertrophy, followed by Dr.      Patsi Sears.   REVIEW OF SYSTEMS:  As stated above.   CURRENT MEDICATIONS:  1. Avapro 300 mg daily.  2. Metoprolol 50 mg daily.  3. Zocor 40 mg daily.  4. Multivitamin every other day.  5. Minoxidil 10 mg daily.  6. Verapamil 240 mg daily.  7. Coumadin per Coumadin clinic.  8. K-Dur 20 mEq 2 tablets in the morning, 1 in the evening.  9. Flomax 0.4 mg daily.  10.Aspirin 325 daily which patient has stopped.  11.Demadex 20 mg 3 tablets in the a.m. and 2 in the  evening.  12.Colchicine p.r.n.  13.Darvocet p.r.n.  14.Zaroxolyn p.r.n. when prescribed.  15.O2 two L nasal cannula p.r.n.   PHYSICAL EXAMINATION:  Weight 210 pounds.  Weight is down 4 pounds from  last week.  Blood pressure 94/56, and 90/60 with a heart rate of 72.  [name] [name] is in no acute distress.  He has JVD at 10 cm at a 45 degree angle with a prominent CV wave.  LUNGS:  Decreased breath sounds throughout.  I do not hear any crackles.  He does have some fine expiratory wheezing in the left anterior lobe.  CARDIOVASCULAR EXAM:  S1 and S2 within normal limits.  He has a 2/6  systolic ejection murmur.  Pulse is irregular at this time.  ABDOMEN:  Soft, nontender, positive bowel sounds.  LOWER EXTREMITIES:  Without clubbing or cyanosis, he has 2+ DPs.  He has  moderate lower extremity edema that is nonpitting to his knees.  Neurologically he is alert and oriented x3.   IMPRESSION:  Diastolic heart failure:  The patient still has signs of  volume overload at this time, although he states he feels much better  since treated by Dr. Antoine Poche on the 12th.  I am going to repeat blood  work today and have patient weigh daily at home.  The patient states he  has not been weighing daily.  He just feels like his weight is down.  Will hold on any further Zaroxolyn until blood work returned.  The  patient is not in any distress at this time.  I have initiated heart  failure education with him.  He has a heart failure packet.  I have  instructed him to weigh every day and record, and to call me if his  weight is up 3 pounds in 2 days.  Will continue with a 4 g sodium diet  and a 2000 cc fluid restriction at this time.  Note, the patient states  that he thinks he is dehydrated and has been drinking more water over  the last few days.  I have had to reinforce fluid restriction education  with him.  I will see him back in 2 weeks for reevaluation, and touch  base with him by phone regarding the  results of his lab work.   ADDENDUM:  Follow-up on lab work obtained on [name] [name] at that visit:  BNP 1263.  Sodium 140, potassium critical low  at 2.7, chloride low at  94, CO2 elevated at 38, glucose 100, BUN elevated at 53, creatinine 1.7.  The patient was called at home.  I attempted to speak with patient on  several different occasions, did not return call back.  Finally reached  the patient at 7 p.m. on March 28, 2007, and requested that he go to  the emergency room for further evaluation regarding abnormal lab work.  Mr. Alonso stated that he could not possibly go to the emergency room  at that time, he had people working on his house, and that he would go  the next morning.  I informed him that his potassium was critically low  and that his renal function was strained and that he still was showing  signs of volume overload, although he was clinically stable when I  evaluated him earlier in the a.m.  He stated that he would take extra  potassium and would go tomorrow.  I asked him then if he was taking his  potassium as ordered.  He states most of the time he does but  occasionally he forgets a dose.  Unable to clarify how many doses he has  missed.  I instructed him to take 40 mEq of potassium now, in two hours  repeat 40 mEq of potassium, and then in another two hours repeat another  40 mEq of potassium, and that I would like him to proceed to the  emergency room in the morning for further evaluation as I will not be in  the office on Friday.  The patient also was instructed to call 9-1-1 if  he develops any acute problems, i.e., palpitations, lightheadedness,  dizziness, presyncope or syncopal episodes.  The patient verbalized  understanding.  Dr. Gala Romney is aware of patient's status.      Dorian Pod, ACNP  Electronically Signed      Bevelyn Buckles. Bensimhon, MD  Electronically Signed   MB/MedQ  DD: 03/28/2007  DT: 03/29/2007  Job #: [id number]   cc:   Lonzo Cloud.  Kriste Basque, MD

## 2010-12-20 NOTE — Assessment & Plan Note (Signed)
[facility]                          CHRONIC HEART FAILURE NOTE   NAME:[name] [name], [name] [name]                   MRN:          [id number]  DATE:05/14/2007                            DOB:          1925-10-14    PRIMARY CARDIOLOGIST:  Rollene Rotunda.   PRIMARY CARE PHYSICIAN:  Alroy Dust.   [name] [name] returns today for followup of his congestive heart failure  which is diastolic in nature with an ejection fraction, currently, of  75%, status post cardiac catheterization in 2005 showing normal  coronaries.  [name] [name] states he has been doing quite well since I  last saw him in September.  He states his weight at home has been  stable.  His dry weight is around 196 pounds.  He complained of mild  dyspnea on exertion, otherwise.  No symptoms suggestive of volume  overload.  He states he has not taken any of his medications yet this  morning, and his blood pressure is mildly elevated.   PAST MEDICAL HISTORY:  1. Congestive heart failure secondary to diastolic dysfunction with an      ejection fraction of 75%.  2. Normal coronaries, status post cardiac catheterization.  3. Hypertension.  4. Paroxysmal atrial fibrillation.  5. Chronic anticoagulation therapy with Coumadin which is followed      here in the Coumadin clinic.  6. Pulmonary hypertension.  7. Moderate AS.  8. A 3-cm pulmonary artery aneurysm that has been stable for several      years.  Evaluated by Dr. Tyrone Sage in the past.  9. Dyslipidemia.  10.Ongoing tobacco use.  11.Gout.  12.History of remote GI bleed, evaluated by Dr. Jarold Motto.  13.Status post colonoscopy showing large external hemorrhoids.  14.History of benign prostatic hypertrophy, followed by Dr.      Patsi Sears.   REVIEW OF SYSTEMS:  As stated above.   CURRENT MEDICATIONS:  1. Avapro 300 mg.  2. Metoprolol 100 mg half a tablet daily.  3. Zocor 40 daily.  4. Multivitamin daily.  5. Minoxidil 10 mg daily.  6.  Verapamil 240 daily.  7. Coumadin per Coumadin clinic.  8. Flomax 0.4 daily.  9. Oxygen 2 L nasal cannula p.r.n.  10.KCl 20 mEq 2 tablets b.i.d.  11.Demodex 20 mg 2 tablets b.i.d.  12.Colchicine p.r.n.  13.Darvocet p.r.n.  14.Zaroxolyn p.r.n.  15.Stool softener p.r.n.  16.Excedrin PM p.r.n.  17.Mucinex p.r.n.   PHYSICAL EXAMINATION:  Weight 204, blood pressure 149/80 with a heart  rate of 75.  Note, patient has not taken of his blood pressure medicines  yet today.  NECK:  Supple without lymphadenopathy, no bruits, no JVD.  LUNGS:  Clear to auscultation bilaterally.  CARDIOVASCULAR EXAM:  Reveals an S1 and S2, irregular rhythm with a 2/6  systolic ejection murmur.  ABDOMEN:  Soft, nontender, positive bowel sounds.  LOWER EXTREMITIES:  Without clubbing, cyanosis or edema.  NEUROLOGICALLY:  Alert and oriented x3.   IMPRESSION:  Diastolic heart failure without signs of volume overload.  Blood pressure elevated at the time, although patient states he has not  taken his medications, and would not like to increase his  blood pressure  medication at this time.  Will continue current medications, have  patient follow up with Dr. Antoine Poche for routine cardiology visit.      Dorian Pod, ACNP  Electronically Signed      Rollene Rotunda, MD, [facility]  Electronically Signed   MB/MedQ  DD: 05/14/2007  DT: 05/14/2007  Job #: [id number]   cc:   [name], MD

## 2010-12-20 NOTE — Assessment & Plan Note (Signed)
[facility]                          CHRONIC HEART FAILURE NOTE   NAME:[name] [name]                   MRN:          [id number]  DATE:04/29/2008                            DOB:          [birth year]    PRIMARY CARE PHYSICIAN:  [name], MD   PRIMARY CARDIOLOGIST:  Rollene Rotunda, MD, [facility]   [name] [name] returns today for further followup of his congestive heart  failure which is secondary to nonischemic cardiomyopathy with a normal  EF confirmed by catheterization in the past.  I last saw [name] [name]  back in summer at which time he was stable, blood pressure well  controlled, no signs of volume overload.  He had a routine visit with  Dr. Antoine Poche earlier this month.  His blood pressure at that time was  120/75.  [name] [name] returns today states he is compliant with his  medications; however, his blood pressure is 176/116 and 170/108.  [name] states he feels fine, states he has been taking his medicines  appropriately; however, when I review his medicines with him, it turns  out he has taken his blood pressure medicines at lunchtime, so it has  been almost 24 hours since he has had any medication for his  hypertension.  He denies any chest discomfort, orthopnea, PND,  presyncope or syncopal episodes.   PAST MEDICAL HISTORY:  1. Positive for congestive heart failure, secondary nonischemic      cardiomyopathy, normal EF, normal coronaries by cath.  2. Hypertension.  3. Atrial fibrillation.  4. Pulmonary hypertension.  5. Moderate aortic stenosis.   REVIEW OF SYSTEMS:  As stated above, otherwise negative.   CURRENT MEDICATIONS:  1. Toprol-XL 50.  2. Simvastatin 40.  3. Verapamil 240.  4. Coumadin.  5. Flomax.  6. O2 p.r.n.  7. Antivert p.r.n.  8. Tylenol p.r.n.  9. Clonidine 0.1 mg b.i.d.  10.Allopurinol 100 mg daily.  11.Demadex 20 mg b.i.d.   PHYSICAL EXAMINATION:  VITAL SIGNS:  Weight is 194 pounds, up 6 pounds  from June, blood pressure 170/108.  GENERAL:  [name] [name] is in no acute distress.  NECK:  No signs of jugular vein distention at 45-degree angle.  LUNGS:  Clear to auscultation bilaterally.  CARDIOVASCULAR:  S1 and S2.  Irregular rhythm.  ABDOMEN:  Soft, nontender, positive bowel sounds.  LOWER EXTREMITIES:  Without clubbing, cyanosis or edema.   IMPRESSION:  Congestive heart failure secondary to diastolic  dysfunction.  The patient is extremely hypertensive today.  I have given  him 0.1 mg of clonidine here in the office, which has brought blood  pressure down to 160/94.  I feel comfortable with the patient leaving  the office now and he has his family with him.  Instructed him to take  his medications as soon as he gets home.  I have also asked him to  invest in a good quality blood pressure cuff monitor to wear at home and  record his blood pressure readings.  I have also asked him to take his  medications in the following order clonidine 0.1 mg early in the morning  and at bedtime, the verapamil in the morning, the Demadex in the  morning.  He can take the Toprol at lunchtime if he chooses to and then  he needs to take the clonidine again at bedtime and the Demadex in the  afternoon.  If this does not control his blood pressure, we will need to  make further adjustments in medications.      Dorian Pod, ACNP  Electronically Signed      Rollene Rotunda, MD, [facility]  Electronically Signed   MB/MedQ  DD: 04/29/2008  DT: 04/30/2008  Job #: [id number]

## 2010-12-20 NOTE — Assessment & Plan Note (Signed)
[facility]                          CHRONIC HEART FAILURE NOTE   NAME:[name], [name] CECH                   MRN:          [id number]  DATE:04/19/2007                            DOB:          Feb 15, 1926    PRIMARY CARDIOLOGIST:  Dr. Rollene Rotunda.   PRIMARY CARE:  Dr. Alroy Dust   [name] [name] returns today for followup of his congestive heart failure  which is diastolic in nature with EF currently 75% status post cardiac  catheterization in 2005, showing normal coronaries.  I initially saw [name] as a new patient back in August.  The patient had signs of  volume overload at that time.  The patient had been drinking more fluid  than what was typical for him.  I checked blood work at that time and  asked the patient to follow up in 2 weeks for further evaluation.  Blood  work that was obtained showed a potassium of 2.7, a BNP of 1263, BUN and  creatinine of 53 and 1.7.  I tried on several occasions to reach the  patient at home.  I finally was able to get in touch with him later that  evening after his visit on August 21 and instructed him to go to the  emergency room to have his potassium reevaluated.  The patient declined  and stated he would just take some extra potassium, and he would go to  the emergency room the following morning.  The patient did get  reevaluated the following morning after I had given him instructions to  supplement his potassium at home.  In the emergency room, his potassium  was 3.0 with a BUN of 46.  [name] [name] was given further instructions  in the emergency room and discharged back home.  He states he has been  doing quite well since I last saw him.  He has continued to lose a  significant amount of fluid, states his breathing has been real good.  He did state he had 2 days of coughing with increased phlegm production.  Apparently he had some old antibiotics and states he self-treated with 1  day of antibiotics  and 1 day of Mucinex, and everything cleared up, and  he felt fine since then.  The patient was cautioned on the inappropriate  use of antibiotics.  He also states his Demadex is now 40 mg b.i.d.  Previously it had been 60 and 40.  The patient states that Dr. Kriste Basque cut  his Demadex back to 40 b.i.d.  Otherwise, denied any fever or chills,  states his appetite has been good, sleeping fine.  No shortness of  breath at rest.  No orthostatic dizziness.  Also, in addition, the  patient had followup blood work drawn on April 15, 2007, with a  potassium of 3.4, BUN of 26, creatinine 1.3.  Labs were addressed by Dr.  Antoine Poche, who increased his potassium to 40 mEq b.i.d.   PAST MEDICAL HISTORY:  1. Congestive heart failure secondary diastolic dysfunction with EF      currently 75%.  2. No coronaries status post cardiac catheterization.  3. Hypertension.  4. Paroxysmal atrial fibrillation.  5. Chronic anticoagulation therapy with Coumadin followed here in the      Coumadin clinic.  6. Pulmonary hypertension.  7. Moderate aortic stenosis.  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 Tyrone Sage in the past.  9. Dyslipidemia.  10.Ongoing tobacco use.  11.Gout.  12.History of GI bleed evaluated by Dr. Jarold Motto in the remote past.  13.Status post colonoscopy showing large external hemorrhoids.  14.History of BPH followed by Dr. Patsi Sears.   REVIEW OF SYSTEMS:  As stated above.   CURRENT MEDICATIONS:  1. Avapro 300.  2. Metoprolol 50.  3. Zocor 40.  4. Multivitamin.  5. Minoxidil 10 mg.  6. Verapamil 240.  7. Coumadin per Coumadin clinic.  8. K-Dur 40 mEq b.i.d.  9. Demadex 40 mg b.i.d.  10.Oxygen 2 L p.r.n.  11.Aspirin 325.  12.Flomax 0.4.   PHYSICAL EXAM:  Weight 196.  The patient has lost 14 pounds since August  21.  Pulse originally recorded at 103, manual 88.  Blood pressure  120/76; 12-lead EKG showing atrial fibrillation  at a rate of 88,  unchanged from previous.  The patient is saturating 91-92% on room air.  HEENT:  Unremarkable.  JVD at 8 cm at 45-degree angle.  LUNGS:  Clear to auscultation.  CARDIOVASCULAR:  S1 and S2, irregular rhythm.  ABDOMEN:  Soft, nontender, positive bowel sounds.  LOWER EXTREMITIES:  With 1+ pitting edema.   IMPRESSION:  Diastolic heart failure with mild lower extremity edema.  Have recommended that the patient begin wearing his TED stockings again,  as I feel they would fit more appropriate this time.  Continue Demadex  at current dose, and follow up in 1 month for further evaluation.  We  will also check BMET today.     Dorian Pod, ACNP  Electronically Signed      Bevelyn Buckles. Bensimhon, MD  Electronically Signed   MB/MedQ  DD: 04/19/2007  DT: 04/20/2007  Job #: [id number]

## 2010-12-20 NOTE — Assessment & Plan Note (Signed)
[facility]                          CHRONIC HEART FAILURE NOTE   NAME:[name] [name], [name] [name]                   MRN:          [id number]  DATE:07/16/2007                            DOB:          08/07/1926    PRIMARY CARDIOLOGIST:  Rollene Rotunda.   PRIMARY CARE PHYSICIAN:  Dr. Alroy Dust.   [name] [name] returns today for followup of his congestive heart failure  which is diastolic in nature, with an ejection fraction, currently, of  75%, status post cardiac catheterization in 2005 showing normal  coronaries.  [name] [name] states he has been doing quite well since I  last saw him in October.  He denies any symptoms suggestive of volume  overload.  His only complaint today is of some gout pain in his right  hand.  He recently ate some oysters while visiting with his daughter.  He states he has been using his colchicine p.r.n. for relief.  He is  also complaining of some pain in his left groin.  He states the pain is  similar to the pain he had prior to his herniorrhaphy surgery on the  right in the past, and he occasionally feels a bulging there.  He is  going to follow up with Dr. Kriste Basque for further evaluation.  Otherwise, he  has mild dyspnea on exertion when he is out in the cold air.  He denies  any lightheadedness, dizziness, chest discomfort, or palpitations.   PAST MEDICAL HISTORY:  1. Congestive heart failure secondary to diastolic dysfunction with a      normal ejection fraction.  2. Normal coronaries, status post catheterization in 2005.  3. Hypertension.  4. Paroxysmal atrial fibrillation.  Since earlier this year, the      patient has consistently maintained an atrial fibrillation rhythm      by EKG.  5. Chronic anticoagulation therapy with Coumadin, followed by the      Coumadin clinic.  6. Pulmonary hypertension.  7. Moderate AS.  8. A 3-cm pulmonary artery aneurysm that has been stable for several      years, evaluated by Dr.  Tyrone Sage in the past.  9. Dyslipidemia.  10.Ongoing tobacco use.  11.Gout.  12.History of remote gastrointestinal bleed, followup by Dr.      Jarold Motto.  13.Status post colonoscopy showing large external hemorrhoids.  14.History of benign prostatic hypertrophy, followed by Dr.      Patsi Sears.   REVIEW OF SYSTEMS:  As stated above.   CURRENT MEDICATIONS:  1. Avapro 300 mg daily.  2. Metoprolol 50 mg daily.  3. Zocor 40 mg daily.  4. Multivitamin daily.  5. Verapamil 240 daily.  6. Minoxidil 10 mg daily.  7. Coumadin per Coumadin clinic.  8. Flomax 0.4 daily.  9. Oxygen 2 L p.r.n.  10.K-Dur 20 mEq 2 tablets b.i.d.  11.Demadex 20 mg 2 tablets b.i.d.  12.Colchicine p.r.n.  13.Darvocet p.r.n.  14.Stool softeners p.r.n.  15.Excedrin PM p.r.n.  16.Mucinex p.r.n.  17.Zaroxolyn p.r.n.   PHYSICAL EXAMINATION:  Weight 202 pounds, blood pressure 120/69 with a  heart rate of 83.  [name] [name] is in no acute  distress.  No signs of jugular vein distension at a 45 degree angle.  LUNGS:  Clear to auscultation bilaterally.  CARDIOVASCULAR EXAM:  Reveals an S1 and S2, irregular rhythm.  He has a  2/6 systolic ejection murmur noted.  ABDOMEN:  Soft, nontender, positive bowel sounds.  LOWER EXTREMITIES:  Without cyanosis, clubbing, or edema.  NEUROLOGICALLY:  Alert and oriented x3.   IMPRESSION:  Diastolic dysfunction without signs of volume overload at  this time.  Will continue current medications.  Check blood work today  as patient has had a history of hypokalemia in the past with  noncompliance to potassium supplement.      Dorian Pod, ACNP  Electronically Signed      Rollene Rotunda, MD, [facility]  Electronically Signed   MB/MedQ  DD: 07/16/2007  DT: 07/16/2007  Job #: [id number]

## 2010-12-20 NOTE — Assessment & Plan Note (Signed)
[facility]                          CHRONIC HEART FAILURE NOTE   NAME:[name] [name], [name] [name]                   MRN:          [id number]  DATE:08/29/2007                            DOB:          April 03, 1926    PRIMARY CARDIOLOGIST:  Dr. Antoine Poche.   PRIMARY CARE PHYSICIAN:  Dr. Alroy Dust.   [name] [name] returns today for follow up of his congestive heart  failure, which is diastolic in nature with an EF currently 75% by  echocardiogram done 1 year ago.  Status post cardiac catheterization in  2005 showing normal coronaries.  [name] [name] is accompanied by his  daughter today.  He states he has been feeling extremely poor.  He  complains of increased shortness of breath that started around August 16, 2007 with increased dependency on his oxygen to the point he is  wearing it all of the time now.  He complains of orthopnea x2 nights.  Appetite has been stable.  He has had some chills.  He is not weighing  on a daily basis, but states his abdominal area feels bloated, and his  clothes have been tighter.  He complains of some swelling in his lower  extremities.  He also complains of some gout pain in his hands and  elbows.  He states compliance with medications, although he states he  may have missed a few doses of his potassium.  He has been taking his  diuretic appropriately.  He denies any lightheadedness, dizziness, chest  discomfort or palpitations, although he is in chronic atrial  fibrillation.  He did see Dr. Kriste Basque back in December for evaluation of a  possible inguinal hernia.  He states he has not been evaluated by a  physician since that time, but has continued to feel poorly.  He  actually had an appointment we scheduled for earlier this week, and  cancelled it because he was feeling so bad, but decided to come in today  because he is not getting any better.  He also states he has a  productive cough of clear sputum.   PAST MEDICAL HISTORY:  1. Congestive heart failure secondary to diastolic dysfunction with EF      of 75%.  2. Normal coronaries status post cardiac catheterization.  3. Hypertension.  4. Paroxysmal atrial fibrillation.  5. Chronic anticoagulation therapy with Coumadin.  6. Pulmonary hypertension.  7. Moderate AS.  8. A 3-cm pulmonary artery aneurysm that has been stable for several      years followed by Dr. Tyrone Sage in the past.  9. Dyslipidemia.  10.Ongoing tobacco use.  Patient states he smokes cigars occasionally.  11.Gout.  12.History of GI bleed followed by Dr. Jarold Motto.  13.History of BPH followed by Dr. Patsi Sears.   REVIEW OF SYSTEMS:  As stated above.   CURRENT MEDICATIONS:  Include:  1. Avapro 300.  2. Metoprolol 50.  3. Zocor 40.  4. Multivitamin daily.  5. Verapamil 240.  6. Coumadin per Coumadin Clinic.  7. Flomax 0.4.  8. Oxygen.  9. K-Dur 40 mEq b.i.d.  10.Demadex 40 mg b.i.d.  11.Minoxidil 10 mg daily.  12.Allopurinol 100 daily.  13.Antivert p.r.n.  14.Renew eye drops over the counter p.r.n.  15.Tylenol p.r.n.  16.Colchicine p.r.n.  17.Darvocet p.r.n.  18.Stool softeners p.r.n.  19.Mucinex DM p.r.n.   PHYSICAL EXAMINATION:  Weight 207.  Weight is up 5 pounds.  Blood  pressure 144/85 with a heart rate of 84.  Twelve-lead EKG showing atrial fibrillation at a controlled rate.  Patient with increased shortness of breath at rest, mildly tachypneic.  JVD 10 cm to 12 cm at a 45-degree angle.  LUNGS:  Scattered rhonchi throughout with decreased breath sounds  bilateral bases.  CARDIOVASCULAR EXAM:  Reveals S1 and S2 irregular rhythm, a 2/6 systolic  ejection murmur noted.  ABDOMEN:  Soft and nontender.  Positive bowel sounds.  LOWER EXTREMITIES:  With nonpitting, +2 edema bilaterally.   IMPRESSION:  Congestive heart failure with signs of volume overload at  this time with known history of diastolic dysfunction.  The patient was  evaluated by Dr. Antoine Poche.  We will need to  admit for IV diuretic  therapy.  Obtain PA and lateral chest x-ray and blood work.  We will  have pharmacy dose Coumadin.     Dorian Pod, ACNP  Electronically Signed      Rollene Rotunda, MD, [facility]  Electronically Signed   MB/MedQ  DD: 08/29/2007  DT: 08/29/2007  Job #: [id number]

## 2010-12-20 NOTE — Assessment & Plan Note (Signed)
[facility]                          CHRONIC HEART FAILURE NOTE   NAME:Lucatero, SANTIAGO STENZEL                   MRN:          [id number]  DATE:03/28/2007                            DOB:          [birth year]    ADDENDUM:  Follow-up on lab work obtained on Mr. Loveday at that visit:  BNP 1263.  Sodium 140, potassium critical low at 2.7, chloride low at  94, CO2 elevated at 38, glucose 100, BUN elevated at 53, creatinine 1.7.  The patient was called at home.  I attempted to speak with patient on  several different occasions, did not return call back.  Finally reached  the patient at 7 p.m. on March 28, 2007, and requested that he go to  the emergency room for further evaluation regarding abnormal lab work.  Mr. Vazquez stated that he could not possibly go to the emergency room  at that time, he had people working on his house, and that he would go  the next morning.  I informed him that his potassium was critically low  and that his renal function was strained and that he still was showing  signs of volume overload, although he was clinically stable when I  evaluated him earlier in the a.m.  He stated that he would take extra  potassium and would go tomorrow.  I asked him then if he was taking his  potassium as ordered.  He states most of the time he does but  occasionally he forgets a dose.  Unable to clarify how many doses he has  missed.  I instructed him to take 40 mEq of potassium now, in two hours  repeat 40 mEq of potassium, and then in another two hours repeat another  40 mEq of potassium, and that I would like him to proceed to the  emergency room in the morning for further evaluation as I will not be in  the office on Friday.  The patient also was instructed to call 9-1-1 if  he develops any acute problems, i.e., palpitations, lightheadedness,  dizziness, presyncope or syncopal episodes.  The patient verbalized  understanding.  Dr. Gala Romney is  aware of patient's status.     Dorian Pod, ACNP  Electronically Signed    MB/MedQ  DD: 03/29/2007  DT: 03/30/2007  Job #: [id number]

## 2010-12-20 NOTE — Assessment & Plan Note (Signed)
[name]                            CARDIOLOGY OFFICE NOTE   NAME:[name], [name] SCHUBERT                   MRN:          [id number]  DATE:02/01/2007                            DOB:          April 28, 1926    PRIMARY CARE PHYSICIAN:  Dr. Alroy Dust.   HISTORY OF PRESENT ILLNESS:  Mr. Broad is a 77 year old gentleman  with paroxysmal atrial fibrillation, severe diastolic heart failure,  pulmonary artery aneurysm, and moderate aortic stenosis. In 2005, he had  severe pulmonary hypertension (75/32) in the setting of a wedge pressure  of 36. We managed him medically with increased diuresis. He has had a  couple of years with very modest heart failure. However, he recently  presented to Dr. Kriste Basque with worsened bipedal edema and ascites. Dr.  Kriste Basque increased his Demadex from 40 mg daily to 40 mg twice daily. Dr.  Kriste Basque also started him on nocturnal oxygen. With this, [name] says  he has been sleeping very well. His abdomen has become smaller and his  edema is somewhat better. He says he has been trying to follow a low  sodium diet and has been compliant with his medications. He has not,  however, been weighing himself on a regular basis.   CURRENT MEDICATIONS:  1. Avapro 300 mg daily.  2. Toprol XL 50 mg daily.  3. Zocor 40 mg daily.  4. Multivitamin.  5. Minoxidil 10 mg daily.  6. Verapamil CD 240 mg daily.  7. Coumadin as directed by our Coumadin clinic.  8. K-Dur 40 mEq daily.  9. Demodex 40 mg twice daily.  10.Flomax 0.4 mg daily.   PHYSICAL EXAMINATION:  GENERAL:  He is generally well-appearing in no  distress.  VITAL SIGNS:  Heart rate 97, blood pressure 122/66 and weight of 205  pounds. Weight is actually down 18 pounds from January.  NECK:  Jugular venous pressure is about 8 cm. Carotid pulses are 2+  bilaterally without bruit.  LUNGS:  Respiratory effort is normal. Clear to auscultation.  CARDIAC:  He has a nondisplaced point of  maximal cardiac impulse. There  is an irregular irregular rhythm with a 2/6 systolic ejection murmur  best heard along the right upper sternal border. There is no radiation  of the murmur and no S3.  ABDOMEN:  Soft and nontender. It is mildly distended. No evident fluid  wave.  There is no hepatosplenomegaly. Bowel sounds are normal. There is  a midline pulsatile mass.  EXTREMITIES:  Warm without clubbing, cyanosis, or ulceration. There is,  however, 3+ edema.   Electrocardiogram  demonstrates atrial fibrillation at 97 beats per  minute with left anterior fascicular block and nonspecific ST-T  abnormalities. Corrected QT is mildly prolonged at 469 msec. There is no  significant change compared with prior.   IMPRESSION/RECOMMENDATIONS:  1. Diastolic heart failure:  Clearly substantially worse though is      weight is actually much lower than it was just 6 months ago. This      implies substantial decline in lean body mass. He is not having any      fevers or  night sweats to raise concern for malignancy. We will      leave evaluation to Dr. Kriste Basque. He says he is feeling better with a      higher dose of Demadex. However, he clearly has substantial volume      left to diurese. I will continue with this dose and have him      followup with Dr. Antoine Poche in 3 weeks. I have asked him to weigh      himself on a daily basis and pay attention to his low sodium diet.      He will continue walking regularly as he has.  2. Paroxysmal atrial fibrillation:  In atrial fibrillation today with      controlled ventricular rate. Continue Coumadin from which he has      had no bleeding complications.  3. Pulmonary arterial aneurysm:  Continue conservative management.  4. Aortic stenosis:  Moderate by echocardiogram in January with mean      transvalvular gradient of 21 mmHg.  5. Hypercholesterolemia:  Managed by Dr. Kriste Basque.  6. Disposition:  Followup with Dr. Antoine Poche in 3 or 4 weeks for      monitoring  of his congestive heart failure.     [name] Farber, MD  Electronically Signed    WED/MedQ  DD: 02/01/2007  DT: 02/01/2007  Job #: [id number]

## 2010-12-20 NOTE — Assessment & Plan Note (Signed)
[facility]                          CHRONIC HEART FAILURE NOTE   NAME:[name], [name] CAFARO                   MRN:          [id number]  DATE:01/13/2008                            DOB:          [birth year]    PRIMARY CARE PHYSICIAN:  [name] Cloud. Kriste Basque, MD   PRIMARY CARDIOLOGIST:  [name] Rotunda, MD, [facility]   HISTORY OF PRESENT ILLNESS:  [name] [name] returns today for followup of  his congestive heart failure, which is diastolic in nature.  When I last  saw him in April, I had spoken with his daughter who was managing [name]' medications for him at that time, as there had been confusion  on [name] [name]' part about the dose and frequency of several of his  medicines.  I had also obtained a 24-hour blood pressure monitor  readings to evaluate his hypertension and found that his blood pressure  was averaging 120 to 130/80s.  [name] [name] returns today.  He is not  accompanied by his daughter at this time, but he states he has been  doing quite well and that she continues to fix his medications in his  pill box.  He ambulates with the assistance of a cane.  He drove himself  over today.  He states he has been feeling well.  Appetite has been  good.  Denies any shortness of breath at rest.  Mild dyspnea on  exertion.  Activity, rather sedentary lifestyle.  He states he does take  the trash out to the curb once in a while.  He does go outside and play  with his dogs.  Other than that, not very active.  He denies any chest  discomfort, lightheadedness, or dizziness.   PAST MEDICAL HISTORY:  1. Congestive heart failure secondary to diastolic dysfunction with a      normal ejection fraction and normal coronaries by catheterization.  2. Hypertension.  3. Paroxysmal atrial fibrillation.  4. Chronic anticoagulation therapy.  5. Pulmonary hypertension.  6. Moderate aortic stenosis.  7. 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 Tyrone [name].  8. Dyslipidemia.  9. History of alcohol and tobacco use.  10.Gout.  11.Hypokalemia secondary to the patient's noncompliance with potassium      supplements in the past.  12.History of gastrointestinal bleed followed by Dr. Jarold [name].   REVIEW OF SYSTEMS:  As stated above, otherwise negative.   CURRENT MEDICATIONS:  1. Klor-Con 20 mEq 2 tablets t.i.d.  2. Micardis 80 mg daily.  3. Toprol-XL 50 daily.  4. Simvastatin 40.  5. Verapamil 240.  6. Coumadin as directed.  7. Flomax 0.4 nightly.  8. Clonidine 0.1 mg b.i.d.  9. Allopurinol 100 mg daily.  10.Demadex 20 mg b.i.d.  11.P.r.n. includes Antivert, Tylenol, and O2, 2 L nasal cannula.   PHYSICAL EXAMINATION:  VITAL SIGNS:  Weight 187 pounds, weight is up 2  pounds from April, blood pressure 142/88 with a heart rate of 76.  GENERAL:  [name] [name] is in no acute distress.  NECK:  No signs of jugular vein distention at 45 degrees angle.  LUNGS:  Clear to auscultation.  CARDIOVASCULAR:  Reveals S1 and S2 with a 2/6 systolic ejection murmur  noted.  ABDOMEN:  Soft and nontender.  Positive bowel sounds.  LOWER EXTREMITIES:  Without clubbing, cyanosis, or edema.  NEUROLOGICAL:  Alert and oriented x3.   IMPRESSION:  Congestive heart failure secondary to diastolic  dysfunction.  The patient without signs of volume overload at this time.  Will continue current medications.  I am planning on checking a BMET and  a BMP today.  I suspect we could probably go up on  [name] [name]' blood  pressure medicines.  He is due for a routine cardiology visit with Dr.  Antoine [name].  Blood pressure is still elevated.  Adjustments per Dr.  Jenene [name] recommendation.      [name] [name], ACNP  Electronically Signed      [name] [name]. Bensimhon, MD  Electronically Signed   MB/MedQ  DD: 01/13/2008  DT: 01/14/2008  Job #: [id number]   cc:   [name] Cloud. Kriste Basque, MD

## 2010-12-20 NOTE — Assessment & Plan Note (Signed)
[facility]                          CHRONIC HEART FAILURE NOTE   NAME:[name] [name], [name] [name]                   MRN:          [id number]  DATE:12/03/2007                            DOB:          Nov 02, 1925    PRIMARY CARE PHYSICIAN:  Dr. Alroy Dust.   PRIMARY CARDIOLOGIST:  Dr. Rollene Rotunda.   [name] [name] returns today for follow-up of his congestive heart failure  which is diastolic in nature. When I last saw him, there was confusion  about his medications as he remains hypokalemic. It was ultimately  determined he was not taking his medications as described. Since that  time, his daughter has sat down and gone over his medicines with him.  She actually has typed out a list of his medicines and pulls his  medicines up for him in a pill container. [name] [name] states he has  been taking them just like his daughter has requested. She is not here  with him today but [name] [name] states he has been doing well. He denies  any symptoms suggestive of volume overload, orthopnea, PND, peripheral  edema.  He ambulates with the assistance of a cane when he is out and  about or up and down steps. Other than that, he states he has been  feeling quite well.  He is complaining of some seasonal allergies. Also  when I last saw him last month, his blood pressure was mildly lower than  typical for him although he was asymptomatic. I went ahead and placed a  24-hour blood pressure monitor on him. His blood pressure remained  rather stable for that 24 hour period. The lowest was 95/55 and this was  just on one reading. For the most part, he maintained blood pressure 120-  130s/80s with his mean readings.   PAST MEDICAL HISTORY:  1. Congestive heart failure secondary to diastolic dysfunction with EF      75%.  2. Normal coronary arteries status post cardiac catheterization.  3. Hypertension.  4. Paroxysmal atrial fib.  5. Chronic anticoagulation therapy with  Coumadin.  6. Pulmonary hypertension.  7. Moderate AS.  8. 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 Tyrone Sage in the past.  9. Dyslipidemia.  10.Alcohol and tobacco use.  11.Gout.  12.Hypokalemia secondary to patient noncompliance with potassium      supplements.  13.History of GI bleed followed by Dr. Jarold Motto.  14.History of BPH.   REVIEW OF SYSTEMS:  As stated above, otherwise negative.   CURRENT MEDICATIONS:  1. Toprol XL 50 mg daily.  2. Simvastatin 40.  3. Verapamil 240.  4. Coumadin per [facility].  5. Flomax 0.4 q.h.s.  6. O2 2 liters p.r.n.  7. Antivert p.r.n.  8. Clonidine 0.1 mg b.i.d.  9. Allopurinol 100 mg daily.  10.Demadex 20 mg b.i.d.  11.Klor-Con 20 mEq. The patient should be taken 2 capsules t.i.d.  12.Micardis 80 mg daily.   PHYSICAL EXAM:  Weight 185, blood pressure 118/82, heart rate 79.  [name] [name] is in no acute distress.  No signs of  jugular vein  distention at 45 degree angle.  LUNGS:  Clear to auscultation.  CARDIOVASCULAR:  Reveals an S1 and S2 with a 2/6 systolic ejection  murmur noted.  ABDOMEN:  Flat, soft, nontender, positive bowel sounds.  LOWER EXTREMITIES:  Without clubbing, cyanosis or edema.  NEUROLOGICAL:  Alert and oriented x3.   IMPRESSION:  Congestive heart failure secondary to diastolic  dysfunction.  The patient's fluid volume status is stable at this time.  Will continue current medications. Recent 24-hour blood pressure monitor  readings have essentially been stable.  Will continue clonidine,  Micardis and Toprol and Verapamil at current dose. The patient needs  blood work checked. He has already left the office. He returns in May  for PT, INR in the Coumadin clinic. Will ask that he have a BMET and BNP  drawn at that time.      Dorian Pod, ACNP  Electronically Signed      Rollene Rotunda, MD, [facility]  Electronically Signed   MB/MedQ  DD: 12/03/2007  DT:  12/03/2007  Job #: [id number]   cc:   [name], MD

## 2010-12-20 NOTE — Discharge Summary (Signed)
NAME:  [name] [name], [name] [name] NO.:  [id number]   MEDICAL RECORD NO.:  [id number]          PATIENT TYPE:  EMS   LOCATION:  ED                           [facility]ACILITY:  [facility]   PHYSICIAN:  Rollene Rotunda, MD, FACCDATE OF BIRTH:  1926-05-15   DATE OF ADMISSION:  09/15/2007  DATE OF DISCHARGE:  09/04/2007                               DISCHARGE SUMMARY   ADDENDUM:  Vital signs at time of discharge were blood pressure 129/68,  afebrile, sating 91% on room air, heart rate 78.  BUN and creatinine 30  and 1.38.  INR therapeutic at 2.1.  BNP down to 930.   DISCHARGE INSTRUCTIONS:  The patient scheduled to see Dr. Antoine Poche on  September 27, 2007, at 11:15 a.m.; Dr. Kriste Basque on September 25, 2007, at  3:00 p.m. The patient has a Coumadin Clinic appointment September 27, 2007, at 10:45   DISCHARGE MEDICATIONS:  1. The patient has been instructed to stop his minoxidil.  This has      also been written on his discharge instructions.  2. His new medications are      a.     Xopenex inhaler 1 puff every 4 hours as needed, prescription       provided by a pulmonary.      b.     K-Dur 40 mEq p.o. b.i.d., prescription given.  3. Other medications include      a.     Avapro 300 mg daily.      b.     Metoprolol XL 50 mg daily.      c.     Warfarin, the patient is to take 5 mg on Tuesdays, Thursdays       and Saturdays and 2.5 on Monday Wednesday Friday and Sunday.      d.     Zocor 40 mg.      e.     Verapamil 240 mg daily.      f.     Allopurinol 100 daily.      g.     Flomax 0.4 mg at bedtime.      h.     Colchicine 0.6 mg daily.      i.     Demadex 40 mg p.o. in a.m. and early afternoon.   At time of discharge, an order has been given to nursing staff by Dr.  Delton Coombes to schedule the patient for outpatient full PFTs at [facility]. [facility] including spirometry pre- and post bronchodilator lung  volumes,  diffuse incapacity and respiratory therapy has been asked to teach the  patient how to use his Xopenex prior to discharge home and reconfirm he  knows how to use the flutter valve.   DURATION OF DISCHARGE ENCOUNTER:  Greater than 30 minutes.      Dorian Pod, ACNP      Rollene Rotunda, MD, [facility]  Electronically Signed    MB/MEDQ  D:  09/13/2007  T:  09/15/2007  Job:  [id number]   cc:   [name], MD

## 2010-12-20 NOTE — Assessment & Plan Note (Signed)
[facility]                            CARDIOLOGY OFFICE NOTE   NAME:[name] [name], [name] [name]                   MRN:          [id number]  DATE:01/31/2008                            DOB:          09/22/1925    PRIMARY CARE PHYSICIAN:  [name], MD   REASON FOR PRESENTATION:  Evaluate patient with diastolic heart failure.   HISTORY OF PRESENT ILLNESS:  The patient returns for followup of the  above.  He has been followed closely in Heart Failure Clinic and had  medication titration for control of his blood pressure.  Most recently,  this has been well controlled according to 24-hour monitor.  He has had  no new symptoms.  He thinks his breathing has been fine.  He rarely uses  the oxygen but denies any overt PND or orthopnea.  He has had no chest  discomfort or neck or arm discomfort.  He has had no palpitations,  presyncope, or syncope.   PAST MEDICAL HISTORY:  1. Congestive heart failure with a well-preserved ejection fraction      (normal coronary arteries by catheterization in the past.  Normal      EF).  2. Hypertension.  3. Paroxysmal atrial fibrillation (appears to be permanent now).  4. Pulmonary hypertension.  5. Moderate aortic stenosis.  6. A 3-cm pulmonary artery aneurysm, followed for several years by Dr.      Tyrone Sage, stable.  7. Dyslipidemia.  8. History of alcohol and tobacco use.  9. Gout.  10.Hypokalemia in the past.  11.Gastrointestinal bleeding.   ALLERGIES:  Intolerance to MOTRIN, causes blood-pressure decrease.   MEDICATIONS:  1. Toprol 50 mg daily.  2. Simvastatin 40 mg daily.  3. Verapamil 240 mg daily.  4. Coumadin.  5. Flomax.  6. Oxygen.  7. Antivert.  8. Tylenol.  9. Clonidine 0.1 mg b.i.d.  10.Allopurinol 100 mg daily.  11.Demadex 20 mg b.i.d..   REVIEW OF SYSTEMS:  As stated in the HPI and otherwise negative for  other systems.   PHYSICAL EXAMINATION:  GENERAL:  The patient is in no distress.  VITAL  SIGNS:  Blood pressure 120/75, heart rate 71 and irregular, weight  188 pounds.  NECK:  No jugular venous distention at 45 degrees.  Carotid upstroke  brisk and symmetrical.  No bruits, no thyromegaly.  LYMPHATICS:  No adenopathy.  LUNGS:  Clear to auscultation bilaterally.  BACK:  No costovertebral angle tenderness.  CHEST:  Unremarkable.  HEART:  PMI not displaced or sustained, S1 and S2 within normal limits.  No S3, no murmurs.  ABDOMEN:  Mildly obese, positive bowel sounds, normal in frequency and  pitch, no bruits, no rebound, no guarding, no midline pulsatile mass.  No hepatomegaly, no splenomegaly.  SKIN:  No rashes, no nodules.  EXTREMITIES:  2+ pulses, trace lower extremity edema.  NEUROLOGIC:  Grossly intact.   EKG:  Atrial fibrillation, left axis deviation, left anterior fascicular  block, lateral T-wave inversions more pronounced that previous, although  this has been seen on EKGs intermittently.   ASSESSMENT AND PLAN:  1. Diastolic heart failure.  The patient is  doing well in respect of      this.  The key seems to be blood pressure control, which is well      controlled.  He is getting his weights daily.  He is compliant with      his medications.  At this point, no change in therapy is warranted.  2. Hypertension, as above.  3. Atrial fibrillation.  He is not noticing this rhythm.  He is on      chronic anticoagulation and seems to have good rate control.  He      will continue with this strategy.  4. Followup.  We will see the patient back in about 3 months or sooner      if needed.     Rollene Rotunda, MD, [facility]  Electronically Signed    JH/MedQ  DD: 01/31/2008  DT: 02/01/2008  Job #: [id number]   cc:   [name], MD

## 2010-12-22 ENCOUNTER — Ambulatory Visit (INDEPENDENT_AMBULATORY_CARE_PROVIDER_SITE_OTHER): Payer: Medicare Other | Admitting: *Deleted

## 2010-12-22 DIAGNOSIS — I6350 Cerebral infarction due to unspecified occlusion or stenosis of unspecified cerebral artery: Secondary | ICD-10-CM

## 2010-12-22 DIAGNOSIS — I4891 Unspecified atrial fibrillation: Secondary | ICD-10-CM

## 2010-12-22 DIAGNOSIS — Z7901 Long term (current) use of anticoagulants: Secondary | ICD-10-CM

## 2010-12-23 NOTE — Discharge Summary (Signed)
NAME:  JEFFRIESDaden, Mahany                      ACCOUNT NO.:  [id number]   MEDICAL RECORD NO.:  [id number]                   PATIENT TYPE:  INP   LOCATION:  0340                                 FACILITY:  [facility]   PHYSICIAN:  Salvadore Farber, M.D. [facility]         DATE OF BIRTH:  1925/09/23   DATE OF ADMISSION:  12/19/2003  DATE OF DISCHARGE:  12/25/2003                           DISCHARGE SUMMARY - REFERRING   PROCEDURE:  Cardiac catheterization, May 18th.   REASON FOR ADMISSION:  Please refer to dictated admission note.   LABORATORY DATA:  Normal cardiac markers.  Lipid profile:  Total cholesterol  127, triglycerides 73, HDL 40, LDL 72 (cholesterol/HDL ratio 3.2).  TSH  2.009.  Normal free T4.  A 24-hour urine:  122 mg protein.  Sodium 142,  potassium 4.5, BUN 19/creatinine 2.0 at discharge, 34/1.7 on admission.  BNP  703 on admission.  Hemoglobin 11.9, hematocrit 36 (MCV 90) on admission.  INR 3.3 on admission, 1.4 on discharge.  Normal liver enzymes.  Normal serum  protein electrophoresis profile.   ADMISSION CHEST X-RAY:  Cardiomegaly.  Left lower lobe atelectasis.  Right  pulmonary artery aneurysm.  No change since March, 2005.   HOSPITAL COURSE:  Patient presented to [facility] emergency room with  symptoms suggestive of congestive heart failure and was also found to be in  sinus bradycardia at a rate of 46 beats per minute.  In addition to  complaining of progressive shortness of breath, he also noted associated  weakness, PND, increased abdominal girth, lower extremity edema, and  atypical chest pain.   Following admission, patient ruled out for MI with all serial cardiac  markers within normal limits.  Patient was placed on an aggressive diuretic  regimen for treatment of congestive heart failure.  Beta blocker was  decreased to 100 q.d. for the sinus bradycardia.  The clonidine was  initially held as well.   In anticipation of proceeding with the right/left cardiac  catheterization,  Coumadin was placed on hold.   Patient had symptoms improvement of his dyspnea following treatment with IV  Lasix.  Meds were further adjusted by Dr. Dietrich Pates, who noted that the  patient by now being taken off all rate-control medications, felt that this  would not be feasible, given his history of PAF with RVR.  He therefore  resumed metoprolol and started the patient on verapamil as well.  He also  noted the flecainide might not be the best drug, given the patient's known  history of substantial LVH and congestive heart failure.  In preparation for  catheterization, the patient was treated with vitamin K for his elevated  INR.   Regarding dysrhythmias, the patient did have brief asymptomatic bursts of  narrow-complex tachycardia and short bursts of wide-complex tachycardia.  The patient did have some mild hypokalemia at 3.2, which was subsequently  normalized.  Magnesium level was also normal.  Patient had no further bursts  of dysrhythmia by  the time of discharge on hospital day #6.   On May 18th, the patient was ready for cardiac catheterization.  This was  performed by Dr. Randa Evens.  See reports for full details.  Revealed  normal coronary arteries and hyperdynamic LVF (70%) with no evidence of wall  motion abnormalities.  Dr. Samule Ohm concluded that the patient had severe  pulmonary hypertension in the setting of elevated LVEDP and recommended  continued aggressive diuretic management.  Of note, the patient also was  found to have mild aortic stenosis.   Postoperative course remained stable.  Patient continued to remain stable  with no recurrent dyspnea.  His meds were readjusted, and renal function was  monitored closely.  Norvasc was discontinued, given the patient was placed  on verapamil, and nitro paste was also DC'd.  Patient was placed back on  home dose of Lipitor.   Of note, patient was also taken off flecainide by Dr. Samule Ohm following his   catheterization.   By the time of discharged on hospital day #6, the patient was ambulating  without complaints of shortness of breath.  The right groin was stable with  no evidence of hematoma or bruit.  Orthostatic blood pressure readings were  stable.  Patient had diuresed approximately 22 pounds since admission.  Creatinine did rise slightly (2.0) at discharge, compared to 1.7 on  admission.  This will be followed closely as an outpatient.   A repeat chest x-ray on May 19th was still pending at the time of dictation.   DISCHARGE MEDICATIONS:  1. Toprol XL 100 mg q.d.  2. Calan SR 120 mg q.d. (new).  3. Coumadin 5 mg q.d.  4. Lipitor 20 mg q.d.  5. Flomax 0.4 mg q.d.  6. Minoxidil 10 mg b.i.d.  7. Demadex 40 mg q.d.  8. K-Dur 40 mEq b.i.d.  9. Colchicine 0.6 mg b.i.d. p.r.n.   INSTRUCTIONS:  1. Stop taking flecainide.  2. No added salt in diet.  3. Keep previously scheduled appointment for a pro time on Tuesday, May 24th     at 10:45 a.m.  Patient will also have a follow-up metabolic profile at     that time as well.  4. Patient will follow up with Dr. Randa Evens on Tuesday, May 24th at 11     a.m., as previously scheduled.   DISCHARGE DIAGNOSES:  1. Congestive heart failure.     A. Secondary to severe diastolic dysfunction.     B. Normal coronary arteries, cardiac catheterization on May 18th.     C. Hyperdynamic left ventricular function.  2. Sinus bradycardia.  3. Mild chronic renal insufficiency.  4. Chronic Coumadin.  5. History of paroxysmal atrial fibrillation.     A. Flecainide discontinued this admission.  6. History of stroke.  7. Mild aortic stenosis.  8. Dyslipidemia.  9. Hypertension.  10.      Gout.  11.      Normocytic anemia.     Gene Serpe, P.A. [facility]                      Salvadore Farber, M.D. [facility]    GS/MEDQ  D:  12/25/2003  T:  12/25/2003  Job:  [id number]   cc:   [name], M.D. [facility]

## 2010-12-23 NOTE — Assessment & Plan Note (Signed)
[facility]                              CARDIOLOGY OFFICE NOTE   NAME:[name] [name], [name] [name]                   MRN:          [id number]  DATE:05/10/2006                            DOB:          06-Nov-1925    PRIMARY CARE PHYSICIAN:  [name], MD.   HISTORY OF PRESENT ILLNESS:  [name] [name] is a 77 year old gentleman with  paroxysmal atrial fibrillation, severe diastolic heart failure, pulmonary  artery aneurysm and mild aortic stenosis. In 2005, I performed cardiac  catheterization which demonstrated angiographically normal coronary  arteries, preserved left ventricular systolic function, mild aortic stenosis  with a mean transvalvular gradient of 19 mmHg and severe pulmonary  hypertension (78/32) in the setting of a wedge pressure of 36 mmHg.   Since the spring of 2005, he has generally been doing fairly well. He  clearly does have days with significant exertional dyspnea. However, he  continues to get out of the house regularly. He has been able to sleep flat  most nights. He is not having any PND. He has had no palpitations, syncope  or presyncope.   CURRENT MEDICATIONS:  1. Flomax 0.4 mg per day.  2. Multivitamin.  3. K-Dur 40 mEq per day.  4. Lipitor 20 mg per day.  5. Coumadin.  6. Minoxidil 10 mg twice per day.  7. Verapamil 240 mg per day.  8. Demodex 40 mg per day.  9. Ecotrin 81 mg per day.  10.Avapro 300 mg per day.  11.Metoprolol 50 mg per day.   PHYSICAL EXAMINATION:  GENERAL:  He is generally well-appearing in no  distress.  VITAL SIGNS:  Heart rate 86, blood pressure 130/72 and weight of 217 pounds.  Jugular venous pressure is only 6 cm. Respiratory effort is normal.  LUNGS:  Clear to auscultation.  CARDIAC:  He has a nondisplaced point of maximal cardiac impulse. There is  an irregularly irregular rhythm with a 2/6 systolic ejection murmur best  heard at the right upper sternal border and without radiation. There is  no  S3 or S4.  ABDOMEN:  Soft, nondistended, nontender. There is no hepatosplenomegaly.  Bowel sounds are normal.  EXTREMITIES:  Warm without clubbing, cyanosis or ulceration. There is trace  edema bilaterally.   Electrocardiogram demonstrates atrial fibrillation with left anterior  fascicular block and lateral T wave inversions which are old.   IMPRESSION/RECOMMENDATIONS:  1. Diastolic heart failure:  Appears fairly well-controlled at present.      Will make no changes in his medication. He is on angiotensin receptor      inhibitor and beta blocker.  2. Paroxysmal atrial fibrillation:  During a previous hospitalization, he      has required both calcium channel blocker and beta blocker for control.      Will therefore continue this. Continue Coumadin. He has had no bleeding      complications. May be candidate for ROCKET trial if he is in atrial      fibrillation at next visit.  3. Pulmonary artery aneurysm:  Continue conservative management.  4. Moderate aortic stenosis:  Repeat echocardiogram in spring of  2008.  5. Hypercholesterolemia:  Managed by Dr. Kriste Basque.  6. Gout:  Managed by Dr. Kriste Basque.   I will plan on seeing him back in 3 months.       Salvadore Farber, MD     WED/MedQ  DD:  05/10/2006  DT:  05/11/2006  Job #:  [id number]   cc:   [name], MD

## 2010-12-23 NOTE — Discharge Summary (Signed)
NAME:  [name] [name], [name] [name]                      ACCOUNT NO.:  [id number]   MEDICAL RECORD NO.:  [id number]                   PATIENT TYPE:  INP   LOCATION:  0370                                 FACILITY:  [facility]   PHYSICIAN:  Veneda Melter, M.D.                   DATE OF BIRTH:  01/13/1926   DATE OF ADMISSION:  10/24/2003  DATE OF DISCHARGE:  10/27/2003                           DISCHARGE SUMMARY - REFERRING   SUMMARY OF HISTORY:  The patient is a 77 year old African-American male who  was initially seen approximately two to three weeks ago according to the  patient by his primary care physician in relationship to increased lower  extremity edema.  He was referred to the cardiologist who noted him to be in  atrial fibrillation with a rapid ventricular rate and he was also placed on  Coumadin.  An echocardiogram at that time showed moderate LVH, normal left  ventricular function and no wall motion abnormality.  There was bilateral  atrial enlargement, mild to moderate aortic stenosis, 1+ tricuspid  regurgitation and mild to moderate pulmonary hypertension.  He had been  doing well until on March 18 when he gradually developed substernal chest  discomfort which he attributed to gas, but he did not obtain any relief with  Gas-Ex and Xanax.  He felt that there was also a pleuritic component to his  symptoms.  He did not have any shortness of breath, radiation, nausea,  vomiting or diaphoresis.  His family brought him to the hospital for further  evaluation.  Initial evaluation in the emergency room showed him to be  hypertensive at 160/100 to 190/122 and tachycardic in the 140's.   PAST MEDICAL HISTORY:  1. Recent diagnosis of atrial fibrillation.  2. Congestive heart failure.  3. Hypertension.  4. Gout.  5. Hyperlipidemia.  6. Cosmetic eye surgery.  7. Herniorrhaphy.  8. Appendectomy.  9. Questionable cerebrovascular accident in 1990 in the setting of     hypertension.  10.      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   LABORATORY DATA:  On admission his hemoglobin and hematocrit were 12.8 and  38.4, normal indices, platelets 251,000 and WBC 10.7.  Subsequent  hematologies were essentially unremarkable.  Admission PT was 19.9 with an  INR of 2.1 and a PTT of 44.0.  INR on March was 2.3, on March 21 it was 1.6  and 1.6 on March 22.  Admission sodium was 136, potassium 3.2, BUN 16,  creatinine 1.1, glucose 127.  Potassium on March 21 was also 3.2 and after  supplementation it was 3.7 on March 22.  After Indocin therapy BUN and  creatinine rose to 29.0 and 1.6 on March 22.  Admission CK, total MB's and  relative indexes were negative for a myocardial infarction and Troponin  levels were borderline at 0.05, 0.05 and 0.07.  Initial BNP was 505; repeat  BNP was 333.  Fasting lipids on March 20 showed a total cholesterol of 142,  triglycerides of 44, HDL of 71 and an LDL of 62.  A chest x-ray showed  asymmetric elevation in the left hemidiaphragm, right pulmonary artery  aneurysm and cardiomegaly with vascular congestion. A chest CT confirmed  right pulmonary artery aneurysm of 3.3 x 3.0 felt to be slightly larger, but  may be within measurement variation.  Initial EKG showed a narrow complex  tachycardia that appeared to be regular with a ventricular rate of  approximately 140, left axis deviation and nonspecific ST-T wave changes.  Subsequent EKG showed atrial fibrillation, left axis deviation and LVH with  probable repolarization.   HOSPITAL COURSE:  The patient was admitted to [facility]  by Dr. Freida Busman, a [facility] fellow who covers our practice.  CVTS, Dr. Dorris Fetch,  saw the patient in consultation in regards to his known pulmonary artery  aneurysm.  Dr. Dorris Fetch felt that observation should be continued.  The  patient overnight did not have any further chest discomfort. Enzymes and  EKG's ruled out a myocardial  infarction.  His blood pressure had improved.  His ventricular rate remained slightly elevated and medications were added.  By March 21 his ventricular rate was controlled to the 50's.  He remained  slightly hypertensive and at this point he was complaining of right ankle  pain indicative of gout of which he has a known history. On March 21 his  hypokalemia was supplemented. Office records were reviewed. A Cardiolite was  ordered to evaluate his chest discomfort.  He was initially placed on  Indocin 50 mg t.i.d. which the patient states that he would take prior to  admission for his gout exacerbations.  He was also placed on some  Colchicine.  On March 21, an Adenosine Cardiolite was performed and this did  not show any ischemia.  It was not gaited due to arrhythmia.  Dr. Samule Ohm had  further adjusted his medications; however, it was noted that on March 21  when the medication adjustments were made he did not receive complete  dosing.  Thus on March 22 his heart rate did remain slightly tachycardic in  the 90's to 100's.  His BUN and creatinine on March 22 were slightly  elevated at 29.0 and 1.6. This was felt to probably be related to the  Indocin, thus this was discontinued and he received a dose of Solu-Medrol 40  mg IV for his gout exacerbation.  After review by Dr. Chales Abrahams, it was felt  that the patient could be discharged home with close follow-up as an  outpatient.   DISCHARGE DIAGNOSES:  1. Chest discomfort of uncertain etiology.  No myocardial infarction by     enzymes or electrocardiograms.  Negative Adenosine Cardiolite.  2. Atrial fibrillation with increased ventricular rate with a therapeutic     INR on admission.  3. Hypertension.  4. Congestive heart failure which may be precipitated by his hypertension.  5. Hypokalemia.  6. Gout.  7. Controlled hyperlipidemia.   DISPOSITION:  The patient is clear for discharge.   NEW MEDICATIONS INCLUDE: 1. Lopressor 50 mg one half  tablet t.i.d.  2. Calan SR 180 mg daily.  3. Colchicine 0.6 mg b.i.d. and he was asked to decrease to one time per day     after four days.  4. Dosepak of prednisone for his gout exacerbation.  5. Continue Coumadin 5 mg;  he is asked to take 10 mg today and 5 mg     thereafter.  6. Clonidine 0.1 mg b.i.d.  7. Minoxidil 10 mg b.i.d.  8. Demadex 40 mg daily.  9. Flomax 0.4 mg daily.  10.      Lipitor 20 mg q.h.s.   DIET:  He is asked to maintain a low salt, low fat, low cholesterol diet.   FOLLOW UP:  1. The office will call him with a follow-up appointment with Dr. Samule Ohm.  2. They will also arrange a follow-up PT/INR this coming Thursday or Monday     for follow-up of his Coumadin level.  A BMP will also be obtained at that     time to follow-up on his BUN and creatinine and potassium.  3. He will arrange a one week appointment with Dr. Kriste Basque to follow-up on his     gout.  4. It was also recommended that he consider a blood pressure diary.   SPECIAL INSTRUCTIONS:  1. He is advised not to take his Indocin or Verapamil.  2. He is asked to avoid pseudoephedrine products.     Joellyn Rued, P.A. [facility]                    Veneda Melter, M.D.    EW/MEDQ  D:  10/27/2003  T:  10/28/2003  Job:  [id number]   cc:   [name], M.D. [facility]   Salvadore Farber, M.D.  [address]  [address]  [place]  [place] [address]

## 2010-12-23 NOTE — H&P (Signed)
NAME:  [name] [name], GULINO                      ACCOUNT NO.:  [id number]   MEDICAL RECORD NO.:  [id number]                   PATIENT TYPE:  INP   LOCATION:  0370                                 FACILITY:  [facility]   PHYSICIAN:  Marrian Salvage. Freida Busman, M.D. [facility]            DATE OF BIRTH:  12/14/1925   DATE OF ADMISSION:  10/24/2003  DATE OF DISCHARGE:                                HISTORY & PHYSICAL   PRIMARY CARE PHYSICIAN:  [name], M.D.   CHIEF COMPLAINT:  Chest pain.   SOURCE:  The patient, friend, and computer records.  The patient is a poor  historian and he had limited records available on the computer system.   HISTORY OF PRESENT ILLNESS:  The patient is a 77 year old African-American  man with history of  pulmonary artery aneurysm as well as hypertension and  CHF.  He has no known CAD per history.  He was in his usual state of health  until about 2-3 weeks ago when he had increased lower extremity edema.  He  was seen by his primary care physician and referred to a cardiologist who  then started him on Coumadin to prevent stroke.  The patient thinks he had  an irregular rhythm and it sounds like from report that he was diagnosed at  that time with atrial fibrillation.  He is on verapamil at this time for the  atrial fibrillation as well as blood pressure control.  He has been  complaint with his medications and has not had significant dietary changes.   The patient was stable until March 18 when he had gradual onset of mild  substernal chest pressure.  The patient initially thought it was gas and  took Gas-X as well as Zantac without much relief.  He also had a pleuritic  component to his symptoms.  He had no shortness of breath, no radiation, no  nausea and vomiting, no diaphoresis.  Finally, he decided to get medical  attention after about 24 hours of the chest discomfort.  His friend brought  him to [facility] emergency department.   On initial evaluation the  patient had 2/10 chest pain.  However, he was  tachycardic to the 140s and in narrow complex tachycardia that was initially  regular with ST depressions.  His blood pressure was also 160/100 on arrival  and increased to 190/122.  He was given labetalol IV 20 mg x2 and his blood  pressure decreased to 110/70 with heart rates in the 110s.  CT of the chest  showed stable PA aneurysm and he was seen by thoracic surgery.  Additionally, cardiac markers were negative.  Repeat electrocardiography  showed that the patient was in atrial flutter with 2:1 and 3:1 block.  The  patient was mostly asymptomatic on evaluation by cardiology.   ALLERGIES:  MOTRIN caused hypotension.   MEDICATIONS:  1. Clonidine 0.1 mg b.i.d.  2. Minoxidil 10 mg b.i.d.  3. Torsemide 40 mg q.a.m.  4. Flomax 0.4 mg daily.  5. Verapamil extended release 240 mg daily.  6. K-Dur 20 mEq b.i.d.  7. Lipitor 20 mg daily.  8. Indomethacin 50 mg p.r.n.  9. Coumadin.   PAST MEDICAL HISTORY:  1. Atrial fibrillation.  2. Congestive heart failure, possibly due to valvular disease per report.  3. Hypertension.  4. Gout.  Last episode was greater than a year ago.  5. Hyperlipidemia.  6. Cosmetic eye surgery in the last year.  7. Status post appendectomy and hernia repair.  8. Question of cerebrovascular accident in 1990 in the setting of     hypertension.  9. Right PA aneurysm 3 x 3 cm, stable on serial imaging.   SOCIAL HISTORY:  The patient lives in [place] with his wife.  She is not  in good health.  He has a daughter named [name] who also helps  out.  He is retired from [employer].  He does not have a  smoking or tobacco history except for rare use of cigars.   FAMILY HISTORY:  Noncontributory.   REVIEW OF SYSTEMS:  Positive for chest pain, mild shortness of breath,  edema, palpitations, and mild weakness.  Otherwise, negative in detail.   ADVANCE DIRECTIVES:  The patient is a Full Code.    PHYSICAL EXAMINATION:  VITAL SIGNS:  Temperature 100.0, pulse 100 to 150,  with blood pressure of 190/122 maximum, down to 110/70 after treatment.  Weight 210 pounds.  Respirations 20 with saturation 96% on room air.  GENERAL:  The patient was in no apparent distress, conversant and  comfortable.  HEENT:  His pupils were equal.  He had full dentures.  NECK:  Showed normal thyroid without nodules, no carotid bruits, and normal  jugular venous pressure.  No lymphadenopathy.  HEART:  Heart rate was tachycardic and irregularly irregular with normal S1,  S2, and no S3.  He had AS murmur.  LUNGS:  Clear to auscultation bilaterally.  SKIN:  Showed no rash.  RECTAL:  Guaiac negative with brown stool.  EXTREMITIES:  Showed mild edema.  NEUROLOGIC:  He was alert and oriented x3.   A chest CT showed a stable PA aneurysm at about 3 x 3.5 cm.   Echocardiogram done per patient report but records not available.   EKG showed evidence of atrial flutter with 2:1 and 3:1 block.  No  significant ischemic changes.  His initial EKG on arrival showed a very  regular sinus tachycardia which in hindsight was most likely atrial flutter  with regular 2:1 block.   Laboratories:  White blood cell count 11, hematocrit 38, platelets 250.  Sodium 136, potassium 3.2, bicarb 31, creatinine 1.1, glucose 127.  CK 177,  MB 3.2, troponin 0.05.  Calcium 8.7.  PTT 44, INR 2.1.   ASSESSMENT AND PLAN:  A 77 year old man presents with hypertensive urgency  and atrial fibrillation with rapid ventricular response.   1. Atrial fibrillation.  Sounds like it was newly-diagnosed 2 weeks ago.  He     is now therapeutic on Coumadin and on a calcium channel blocker with     inadequate rate control.  Initial EKG was very regular but now clearly     looks like atrial fibrillation/flutter.  We have added beta blocker to    his diltiazem for rate control.  His blood pressure dropped too much on     labetalol so Lopressor is being used  initially and can be transitioned to  Toprol-XL prior to discharge.  Will hold his Coumadin acutely in case an     intervention is needed, check his INR, and treat him with IV heparin when     his INR is less than 2.  Will check TSH.  Will try and get echocardiogram     records but if one has not been done he will need transthoracic     echocardiography.  His potassium is being repleted.  It may be     appropriate to treat him with cardioversion status post TEE given his     increased edema and CHF in the setting of atrial fibrillation.     Otherwise, rate control would be appropriate.  2. Chest pain.  Unclear etiology, now resolved.  Possible demand ischemia     secondary to rapid ventricular response.  His troponin is grossly normal     so far.  Anticoagulation with aspirin and Coumadin transitioning to     heparin.  He is on beta blocker and statin.  Rule out MI as well as keep     him on telemetry.  Stress test should be performed on Monday.  His rate     will need to be adequately controlled prior to that and he will likely     need adenosine Cardiolite.  He has also been given proton pump inhibitor     for possible GERD causes.  3. Hypertension.  He has baseline hypertension on three agents.  His chest     pain has resolved and he has no evidence of MI so he is not being treated     as hypertensive emergency.  Will titrate up his beta blockers, continue     his calcium channel blockers of clonidine and minoxidil.  4. Congestive heart failure.  The patient does have edema and mild pulmonary     symptoms but is not in overt heart failure.  The goal would be to diurese     him with increased oral torsemide.  Goal negative 1 L/day.  His potassium     will need to be replaced in this setting.  Additionally, he is on beta     blocker.  Addition of ACE inhibitor can be made for treatment of     diastolic dysfunction and Aldactone would be another possible treatment     should his heart  failure persist after adequate rate control.                                               Marrian Salvage Freida Busman, M.D. [facility]    LAA/MEDQ  D:  10/25/2003  T:  10/26/2003  Job:  [id number]

## 2010-12-23 NOTE — H&P (Signed)
NAME:  [name] [name], College                      ACCOUNT NO.:  [id number]   MEDICAL RECORD NO.:  [id number]                   PATIENT TYPE:  INP   LOCATION:  0109                                 FACILITY:  [facility]   PHYSICIAN:  Verne Grain, MD                DATE OF BIRTH:  [birth year]   DATE OF ADMISSION:  12/19/2003  DATE OF DISCHARGE:                                HISTORY & PHYSICAL   PRIMARY CARDIOLOGIST:  Salvadore Farber, M.D. [facility]   PRIMARY CARE PHYSICIAN:  [name], M.D. [facility]   CHIEF COMPLAINT:  Shortness of breath, weakness, chest pain, sinus  bradycardia.   HISTORY OF PRESENT ILLNESS:  A 77 year old male with history of paroxysmal  atrial fibrillation with rapid ventricular rate, maintained on Coumadin and  flecainide, diastolic congestive heart failure with previous echocardiogram  notable for moderate left ventricular hypertrophy, normal ejection fraction,  no wall motion abnormality, bilateral atrial enlargement, mild to moderate  aortic stenosis, 1+ tricuspid regurgitation, mild to moderate pulmonary  hypertension, and recent adenosine Cardiolite (March 2005), notable for no  infarction, no ischemia, hypertension, hyperlipidemia, mild chronic renal  insufficiency, creatinine 1.5 to 1.7, who reported to the emergency room  after noting 2-3 days of a slow heart rate on his home blood pressure  machine.  During this time, the patient has also had increased shortness of  breath, orthopnea, and paroxysmal nocturnal dyspnea, as well as decreased  exercise intolerance, increased dyspnea on exertion, and increased lower  extremity edema.  The patient has also had some brief chest pains, atypical  in nature, lasting only a few seconds before spontaneous resolution.  No  clear exacerbating or relieving factors noted.  No accompanying nausea or  vomiting or diaphoresis.  The patient was brought to the emergency room by  his daughter for further evaluation of the above  complaints.  The patient  also noted the sensation of increased abdominal girth, as well as scrotal  edema.  His BNP in the ER was 703.  His creatinine in the ER was 1.7.  Cardiac markers were negative x1.  EKG revealed sinus bradycardia in the  40's with ST-T wave abnormalities, likely secondary to LVH, no changes  diagnostic of ischemia.   PAST MEDICAL HISTORY:  1. Diastolic congestive heart failure with outside echocardiogram reporting     moderate LVH, normal ejection fraction, no wall motion abnormalities,     bilateral atrial enlargement, mild to moderate aortic stenosis, 1+     tricuspid regurgitation, mild to moderate pulmonary hypertension, status     post adenosine Cardiolite (March 2005) with no ischemia and no     infarction.  2. History of paroxysmal atrial fibrillation with rapid ventricular rate,     diagnosed with the first episode in 2005 in the setting of diastolic     heart failure, maintained on flecainide and Coumadin, with current INR of     3.3.  3. Hypertension, maintained on medications including minoxidil.  4. Hyperlipidemia.  5. 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 Orson Aloe in CT surgery.  6. Mild chronic renal insufficiency with a creatinine of 1.5 to 1.7.  7. History of gout.  8. Questionable history of stroke in 1990.  9. Remote history of hernia repair.  10.      Status post appendectomy.  11.      BPH, maintained on Flomax.   SOCIAL HISTORY:  The patient lives in [place] with his wife.  He  occasionally smokes a cigar; otherwise, has no other tobacco use.  He denies  any alcohol or drug intake.   FAMILY HISTORY:  Noncontributory.   REVIEW OF SYSTEMS:  Essentially negative, other than that which was  described in the history of present illness and past medical history of  chest pain, shortness of breath, dyspnea on exertion, orthopnea, PND, and  edema.  The patient otherwise reports no  alterations in auditory or visual  acuity.  No alterations in bowel or bladder habits.  Stable neuropsychiatric  status.  No acute rash.  No recent heat or cold intolerance.  No neurologic  complaints.  All other systems are negative.   ALLERGIES/ADVERSE REACTIONS:  MOTRIN.   MEDICATIONS:  1. Klor-Con 40 mEq p.o. b.i.d.  2. Lipitor 80 mg p.o. q.h.s.  3. Flecainide 50 mg p.o. b.i.d.  4. Toprol-XL 200 mg p.o. daily.  5. Coumadin 5 mg p.o. daily.  6. Torsemide 40 mg p.o. daily.  7. Flomax 0.4 mg p.o. daily.  8. Clonidine 0.1 mg p.o. b.i.d.  9. Minoxidil 10 mg p.o. b.i.d.  10.      Aspirin 81 mg p.o. daily.   PHYSICAL EXAMINATION:  VITAL SIGNS:  Temperature 97.1, heart rate 34 and  regular, respiratory rate 16, blood pressure 120/67, oxygen saturation 94%  on room air.  GENERAL:  The patient is alert and answers questions appropriately.  He is  pleasant and cooperative.  HEENT:  Normocephalic and atraumatic.  Pupils equal, round and reactive to  light.  Extraocular movements are intact.  NECK:  Supple.  There is evidence of jugular venous distention when the  patient is placed at approximately 40 degrees.  There are no bruits heard.  There is no lymphadenopathy appreciated.  The patient has a regular S1 and a  regular S2, bradycardic in rate.  A systolic ejection crescendo decrescendo  murmur is present; however, there is no delay in carotid pulse, and the  murmur is not late peaking, and ST was well preserved.  The patient's lung  fields have bibasilar crackles with decreased breath sounds at both bases.  SKIN:  Brief skin examination reveals no acute rash.  EXTREMITIES:  Lower extremity examination reveals 2+ femoral pulses with no  femoral bruits.  ABDOMEN:  Soft, nontender, and nondistended, with positive bowel sounds.  GENITOURINARY:  Notable for scrotal edema.  EXTREMITIES:  Equal 2 to 3+ bilateral lower extremity edema, but the patient's neurologic status is grossly intact.   He is able to move all four  extremities without difficulty with strength and sensation grossly normal  throughout.   CHEST X-RAY:  No focal opacities.  Some left lower lobe atelectasis.   EKG:  Sinus rhythm at a rate of 46 with left axis deviation and ST-T wave  abnormalities consistent with LVH and a U wave that is noted.  EKG  appearance similar to that obtained  in March of 2005.   LABORATORY VALUES:  White blood cell count 5.2, hematocrit 36, platelet  count 219, sodium 138, potassium 3.9, chloride 106, bicarb 27, BUN 34,  creatinine 1.7, glucose 115, total bilirubin 1.0.  AST 38, ALT 35, alkaline  phosphatase 115.  CK-MB of 1.4.  Troponin I less than 0.05.  BNP 703.  INR  3.3.  Total protein 6.5, albumin 3.8, calcium 8.3.   ASSESSMENT AND PLAN:  1. Sinus bradycardia on 200 mg of Toprol-XL a day.  We will decrease the     patient's Toprol-XL to 100 mg p.o. daily with instructions to hold this     medication for a heart rate of less than 55, and monitor on telemetry.  2. Atypical chest pain and diastolic congestive heart failure with negative     Cardiolite (March 2005) with ejection fraction normal on outside     echocardiogram with moderate left ventricular hypertrophy and mild to     moderate aortic stenosis.  We will reorder an echocardiogram for Monday     morning with plans to maintain the patient on a 2 gm sodium diet with     I&O's and daily weights and IV diuretics to attempt to diurese     approximately 1.5 to 2 liters daily.  If the patient's creatinine remains     stable, will consider initiation of ACE inhibitor, not only to optimize     hypertension, but also to maximize management of patient's congestive     heart failure.  The patient was noted to have atrial enlargement on a     previous echo.  This finding is of unclear significance; however, in     light of his diastolic dysfunction, advanced age, chronic renal     insufficiency, and atrial arrhythmias, we will  check an SPEP and a UPEP     to rule out evidence of multiple myeloma that could underlie an unusual     diagnosis such as amyloidosis.  We will also check a thyroid profile.  We     will hold the patient's Coumadin, and consider him for a right and left     heart catheterization for further evaluate his diastolic dysfunction and     atypical chest pains with recent negative Cardiolite.  3. History of paroxysmal atrial fibrillation, now in sinus rhythm on     flecainide 50 mg p.o. q.12h.  We will continue flecainide as previously     prescribed, but hold the Coumadin for a possible right and left heart     catheterization on Monday.  4. History of mild to moderate aortic stenosis.  We will recheck a     transthoracic echocardiogram, as mentioned above.  There are no findings     of severe AS on exam.  The patient's ST was well preserved.  His murmur     is not late peaking.  His carotid pulses are well preserved and not     delayed. 5. Hyperlipidemia on high dose atorvastatin 80 mg p.o. q.h.s.  The patient's     current GI panel is normal, indicating that he is tolerating this     medication well.  We will check a lipid profile in the morning to assess     his current LDL and triglyceride level on his current medications.  6. History of right pulmonary artery aneurysm measuring 2.8 x 3.2 cm,     followed by Dr. Orson Aloe in cardiothoracic surgery.  We will  note this     known abnormality prior to consideration of right heart catheterization     with attempts at obtaining pulmonary artery wedge pressures or pulmonary     artery catheterization to be targeted to the left pulmonary artery if     possible, and the patient may continue to follow up with Dr. Orson Aloe     for this abnormality in the future.  7. Benign prostatic hypertrophy.  Continue Flomax as previously prescribed.                                               Verne Grain, MD    DDH/MEDQ  D:  12/19/2003  T:   12/19/2003  Job:  [id number]

## 2010-12-23 NOTE — Cardiovascular Report (Signed)
NAME:  JEFFRIESFurious, Chiarelli                      ACCOUNT NO.:  [id number]   MEDICAL RECORD NO.:  [id number]                   PATIENT TYPE:  OUT   LOCATION:  CATH                                 FACILITY:  [facility]   PHYSICIAN:  Salvadore Farber, M.D. [facility]         DATE OF BIRTH:  1926-02-19   DATE OF PROCEDURE:  12/23/2003  DATE OF DISCHARGE:                              CARDIAC CATHETERIZATION   PROCEDURE:  Left and right heart catheterization, left ventriculography,  coronary angiography.   INDICATION:  Mr. Dalton is a 77 year old gentleman who presents with  progressive predominantly right-sided heart failure and brief chest  discomfort.  He has paroxysmal atrial fibrillation, but he has been in sinus  rhythm throughout this episode.   PROCEDURAL TECHNIQUE:  Informed consent was obtained.  Under 1% lidocaine  local anesthesia, a 6 French sheath was placed in the right common femoral  artery and an 8 French sheath in the right common femoral vein using the  modified Seldinger technique.  Right heart catheterization was performed  using a balloon tipped catheter.  Care was taken to avoid the right  pulmonary artery and instead direct the catheter into the left pulmonary  artery due to his known right pulmonary artery aneurysm.  Cardiac output was  measured using the thermodilution technique.  Left heart catheterization and  ventriculography were performed using a pigtail catheter.  Finally, coronary  angiography was performed using a CLS 4 guide for the left and a JR-4  diagnostic catheter for the right.  The patient tolerated the procedure well  and was transferred to the holding room in stable condition.  Sheaths will  be removed there.   COMPLICATIONS:  None.   FINDINGS:   HEMODYNAMICS:  RA 26, RV 82/15/31 with mechanical alternans, PA 78/32/50,  PCW 36 with P waves rising to approximately 46, aorta 116/59/81, LV  152/13/63, aortic valve 29/19/2.7 sq cm.  Cardiac output/index  7.5/3.5.  There is no mitral stenosis.   VENTRICULOGRAPHY:  1. Ejection fraction approximately 70% without regional wall motion     abnormality.  2. No mitral regurgitation.   CORONARY ANGIOGRAPHY:  1. Left main:  Angiographically normal.  2. LAD:  Moderate size vessel giving rise to single diagonal.  It is     angiographically normal.  3. Circumflex:  Moderate size vessel giving rise to a single branching     obtuse marginal.  It is angiographically normal.  4. RCA:  Large dominant vessel.  It is angiographically normal.   IMPRESSION/RECOMMENDATIONS:  1. Angiographically normal coronary arteries.  2. Mild aortic stenosis.  3. No mitral stenosis or mitral regurgitation.  4. Severe pulmonary hypertension in the setting of markedly elevated left     ventricular end-diastolic pressure.   The primary problem appears to be severe diastolic dysfunction with severe  pulmonary hypertension and consequent right ventricular failure.  We will  try to decrease LVEDP with increasing diuretic.  Salvadore Farber, M.D. [facility]    WED/MEDQ  D:  12/23/2003  T:  12/24/2003  Job:  [id number]   cc:   [name], M.D. [facility]

## 2010-12-23 NOTE — Assessment & Plan Note (Signed)
[facility]                            CARDIOLOGY OFFICE NOTE   NAME:[name] [name], [name] [name]                   MRN:          [id number]  DATE:08/13/2006                            DOB:          [birth year]    PRIMARY CARE PHYSICIAN:  [name], M.D.   HISTORY OF PRESENT ILLNESS:  [name] is a 77 year old gentleman  with paroxysmal atrial fibrillation, severe diastolic heart failure,  pulmonary artery aneurysm, and mild aortic stenosis by most recent  assessment.  In 2005, he had severe pulmonary hypertension (75/32) in  the setting of a wedge pressure of 36.  We have managed him medically  with increased diuresis.  He has been doing fairly well over the past  couple of years.  He does have days with significant exertional dyspnea.  However, most days are good.  He is not having an PND or orthopnea.  He  has had no palpitations, syncope, presyncope, or chest pain.   CURRENT MEDICATIONS:  1. Flomax 0.4 mg per day.  2. Multivitamin.  3. K-Dur 40 mEq per day.  4. Lipitor 20 mg per day.  5. Coumadin as directed by our Coumadin clinic.  6. Minoxidil 10 mg twice per day.  7. Verapamil 240 mg per day.  8. Demadex 40 mg per day.  9. Avapro 300 mg per day.  10.Toprol-XL 50 mg per day.   PHYSICAL EXAMINATION:  GENERAL:  He is generally well-appearing and in  no distress.  VITAL SIGNS:  Heart rate 87, blood pressure 127/65, and weight of 223  pounds.  Weight is up 6 pounds from October.  NECK:  Jugular venous pressure is 6 cm.  Carotid pulses 2+ bilaterally  without bruit or transmitted murmur.  LUNGS:  Respiratory effort is normal.  Lungs are clear to auscultation.  CARDIAC:  He has a nondisplaced point of maximal cardiac impulse.  There  is an irregularly irregular rhythm with a 2/6 systolic ejection murmur  best heard at the right upper sternal border.  There is no radiation of  the murmur.  There is no S3.  ABDOMEN:  Soft, nondistended,  nontender.  There is no  hepatosplenomegaly.  Bowel sounds are normal.  EXTREMITIES:  Warm without clubbing, cyanosis, edema, or ulceration.   Electrocardiogram demonstrates atrial fibrillation at 78 beats per  minute with an occasional aberrantly conducted beat.  There is left axis  deviation and apical and lateral T wave inversions.  The QT is mildly  prolonged at 474 milliseconds (corrected).   IMPRESSION/RECOMMENDATIONS:  1. Diastolic heart failure.  Appears well-controlled at present.  Make      no changes in current medication.  2. Paroxysmal atrial fibrillation.  During prior hospitalization, he      has required both calcium channel blocker and beta-blocker for      control.  Will therefore make no changes.  Continue Coumadin.  He      has had no bleeding complications.  We did discuss the ROCKET      trial, but he is not interested in participating.  3. Pulmonary artery aneurysm.  Continue conservative management.  4. Moderate aortic stenosis.  Will schedule repeat echocardiogram.  5. Hypercholesterolemia.  Managed by Dr. Kriste Basque.   I will plan on seeing him back in 12 months' time.     Salvadore Farber, MD  Electronically Signed    WED/MedQ  DD: 08/13/2006  DT: 08/13/2006  Job #: [id number]   cc:   [name], MD

## 2011-01-13 ENCOUNTER — Encounter: Payer: Self-pay | Admitting: Pulmonary Disease

## 2011-01-16 ENCOUNTER — Ambulatory Visit (INDEPENDENT_AMBULATORY_CARE_PROVIDER_SITE_OTHER): Payer: Medicare Other | Admitting: Pulmonary Disease

## 2011-01-16 DIAGNOSIS — M109 Gout, unspecified: Secondary | ICD-10-CM

## 2011-01-16 DIAGNOSIS — E7800 Pure hypercholesterolemia, unspecified: Secondary | ICD-10-CM

## 2011-01-16 DIAGNOSIS — M159 Polyosteoarthritis, unspecified: Secondary | ICD-10-CM

## 2011-01-16 DIAGNOSIS — I509 Heart failure, unspecified: Secondary | ICD-10-CM

## 2011-01-16 DIAGNOSIS — I359 Nonrheumatic aortic valve disorder, unspecified: Secondary | ICD-10-CM

## 2011-01-16 DIAGNOSIS — I2589 Other forms of chronic ischemic heart disease: Secondary | ICD-10-CM

## 2011-01-16 DIAGNOSIS — I2789 Other specified pulmonary heart diseases: Secondary | ICD-10-CM

## 2011-01-16 DIAGNOSIS — I10 Essential (primary) hypertension: Secondary | ICD-10-CM

## 2011-01-16 DIAGNOSIS — I4891 Unspecified atrial fibrillation: Secondary | ICD-10-CM

## 2011-01-16 DIAGNOSIS — I281 Aneurysm of pulmonary artery: Secondary | ICD-10-CM

## 2011-01-16 DIAGNOSIS — N259 Disorder resulting from impaired renal tubular function, unspecified: Secondary | ICD-10-CM

## 2011-01-16 MED ORDER — TRAMADOL HCL 50 MG PO TABS: 100.0000 mg | ORAL_TABLET | Freq: Four times a day (QID) | ORAL | Status: AC | PRN

## 2011-01-16 NOTE — Progress Notes (Signed)
Subjective:    Patient ID: [name] [name], male    DOB: [birth year], 77 y.o.   MRN: [id number]  HPI 77 y/o BM here for a follow up visit... he has multiple medical problems including HBP, CAD, AS, AFib, & CHF followed by DrHochrein & the CHFclinic;  known PulmHTN & PA aneurysm conservatively managed by DrGearhardt;  Hyperchol;  RI & BPH;  DJD & Gout...  ~  January 17, 2010:  last visit we added Aldactone 25mg /d hoping to spare him some of the six KCl tabs he takes daily due to his Demadex requirement... this helped a little & he is down to 4 KCl per day but he developed gynecomastia & [name] [name] switched him to Eplerenone 25mg ... he has had some diminution of the gynecomastia & tenderness- and still on 4 Demadex & 4 KCl (overall it hardly seems worth the Inspra cost to save 2KCl tabs per day- but he wants to continue & ask DrHochrein at his next follow up)... BP controlled on his now 6-7 meds! & due for f/u in the CHF clinic... he remains in AFib on coumadin... Chol controlled w/ Simva40... renal funct & PSA stable on labs within the last 55mo...   ~  July 18, 2010:  he is "hanging in there" w/ his mult problems at age 79... no ch in med regimen & daugh does his meds for him...  denies CP, palpit, SOB, edema, etc... he saw DrHochrein 6/11> chr DOE, no change, same meds... he states that DrGearhardt has "released me" & he's due for a f/u CXR (no change)... Lipids OK on Simva40;  renal function sl worse w/ diuretics but BNP=535;  otherw stable.  ~  January 16, 2011:  55mo ROV & he reports visual problems> has cat surg & has glaucoma on drops now; also c/o rib pain (right lower ribs)/ CWP & we discussed Rx w/ rest/ heat/ rib binder/ & Tramadol...    He was seen by DrHochrein 12/11 for f/u CHF clinic & he decr his Demadex from [facility] to 3 per day due to incr BUN 32 & Creat 1.8; AFib on Coumadin; HBP controlled on meds, & AS> he did f/u 2DEcho showing mild LVH, norm sys function w/ EF=55-60% & no regional  wall motion abn, mod AS, dilated LA&RA, modTR, PAsys=50...    Due for Fasting blood work> FLP looks good on Lip20, Chems stable on the 3 Demadex /d, continue same meds...   Problem List:  OPHTHALMOLOGY - s/p cataract surg & followed for mild Glaucoma on gtts daily...  PULMONARY HYPERTENSION (ICD-416.8) - on home oxygen 2L/min "Prn"... ~  last 2DEcho w/ est PAsys= 64... he has not had a right heart cath...  HYPERTENSION (ICD-401.9) - on METOPROLOL ER 100mg - 1/2 tab daily,  VERAPAMIL 240mg /d,  MICARDIS 80mg /d,  CLONIDINE 0.2mg - 1/2 Bid,  DEMADEX 20mg - 2am & 1pm,  EPLERENONE 25mg /d, & K20- 3/d... he was prev on Minoxidil  but this was  discontinued by Cardiology... BP= 124/62 today and not checking at home, tolerating meds well... denies HA, visual changes, CP, palipit, dizziness, syncope... he has chr DOE without change and mild edema... ~  6/10: DrHochrein added back the Clonidine & asked him, again, to monitor BP at home. ~  12/10:  they requested trial to decr K20's from 6/d required due to his Demedex... suggest adding ALDACTONE 25mg /d & decr K20 to 4/d w/ f/u labs... ~  6/11:  Aldactone caused gynecomastia & was switched to EPLERENONE 25mg /d by [name] [name] w/  some improvment in side effect. ~  6/12:  BP controlled & labs sl improved w/ the Demadex decr to 3/d...  CHF (ICD-428.0) - see meds above... followed in the CHF Clinic by DrHochrein;  he has severe Diastolic CHF, w/ normal C's by cath in 2005, & normal LV systolic function w/ EF=75%... notes DOE- no change. ~  labs 6/09 showed BNP= 761 ~  labs 9/09 showed BNP= 486 ~  labs 12/09 showed BNP = 568 ~  labs 6/10 showed BNP= 421 ~  labs 12/10 showed BNP= 577... adding SPIRONOLACTONE 25mg /d. ~  labs 1/11 showed BNP= 395... later ch to EPLERENONE 25mg /d. ~  labs 12/11 showed BNP= 535 ~  Labs 6/12 showed BNP= 524 (on Demedex3/d + Eplerenone25mg /d).  AORTIC STENOSIS (ICD-424.1) - moderate by 2DEcho & followed by DrHochrein... ~  repeat  2DEcho 1/09 hosp = similiar mod AS... ~  Repeat 2DEcho 1/12 showed mild LVH, norm sys function w/ EF=55-60% & no regional wall motion abn, mod AS, dilated LA&RA, modTR, PAsys=50...  ATRIAL FIBRILLATION (ICD-427.31) - on COUMADIN & followed in the Coumadin Clinic.  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 ~  CXR 1/09 showed unchanged right PA aneurysm, stable cardiomeg, elevated left hemidiaph w/ atx. ~  CXR 6/10 showed no change, Ao tortuous and calcif, NAD. ~  CXR 12/11 showed stable elv left hemidiaph, bibasilar atx/ scarring, cardiomeg, ectatic ao, right PA enlargement.  HYPERCHOLESTEROLEMIA (ICD-272.0) - now on LIPITOR 20mg /d (prev Simva40 changed due to concomitant Verap). ~  FLP 1/09 on Simva40 showed TChol 95, TG 39, HDL 36, LDL 51 ~  FLP 12/09 on Simva40 showed TChol 167, TG 68, HDL 64, LDL 90 ~  FLP 1/11 on Simva40 showed TChol 153, TG 101, HDL 50, LDL 83... later ch to Lip20. ~  FLP 6/12 on Lip20 showed TChol 162, TG 95, HDL 46, LDL 97  HEMORRHOIDS (ICD-455.6)  RENAL INSUFFICIENCY (ICD-588.9)  ~  Creat=1.4 - 1.6 during 1/09 hosp... ~  labs 6/09 & 9/09 showed Creat= 1.3 ~  labs 12/09 showed BUN= 20, Creat= 1.3, K= 4.0 ~  labs 6/10 showed BUN= 24, Creat= 1.3, K= 3.8 ~  labs 12/10 showed BUN= 12, Creat= 1.2, K= 3.6 (on 6 K20/d) ~  labs 4/11 showed BUN= 18, Creat= 1.4, K= 4.0 (on 4 KCl/d) ~  labs 12/11 showed BUN= 32, Creat= 1.8, K= 4.3 ~  Labs 6/12 showed BUN= 27, creat= 1.6, K= 4.1  BENIGN PROSTATIC HYPERTROPHY, HX OF (ICD-V13.8) - followed by DrTannenbaum & taking FLOMAX 0.4mg /d... last seen 5/09 and his note is reviewed... ~  labs 12/09 showed PSA= 1.42 ~  labs 2/11 showed PSA= 1.65  CVA (ICD-434.91) - Stroke in 1990 in the setting of HBP...  DEGENERATIVE JOINT DISEASE, GENERALIZED (ICD-715.00) - he uses OTC pain meds Prn...  GOUT (ICD-274.9) - on ALLOPURINOL 100mg /d,  & now off the Colchicine... ~  labs  10/08 showed Uric= 10.3 ~  labs 12/09 showed Uric= 7.7.Marland Kitchen. he wants to continue same meds. ~  labs 12/11 showed Uric = 6.4  INGUINAL HERNIA (ICD-550.90)  Health Maintenance - he receives the yearly seasonal Flu vaccines... f/u PNEUMOVAX at his request 2010...   Past Surgical History  Procedure Date  . Appendectomy   . Inguinal hernia repair   . Cataract extraction     Outpatient Encounter Prescriptions as of 01/16/2011  Medication Sig Dispense Refill  . allopurinol (ZYLOPRIM) 100 MG tablet Take 1  tablet (100 mg total) by mouth daily.  30 tablet  4  . atorvastatin (LIPITOR) 20 MG tablet Take 20 mg by mouth daily.        . bimatoprost (LUMIGAN) 0.03 % ophthalmic solution Place 1 drop into both eyes at bedtime.        . brinzolamide (AZOPT) 1 % ophthalmic suspension Place 1 drop into both eyes 2 (two) times daily.        . Cholecalciferol (VITAMIN D) 2000 UNITS CAPS Take 1 capsule by mouth daily.        . cloNIDine (CATAPRES) 0.2 MG tablet Take 1/2 tablet po bid  30 tablet  6  . eplerenone (INSPRA) 25 MG tablet TAKE 1 TABLET BY MOUTH EVERY DAY  30 tablet  0  . meclizine (ANTIVERT) 25 MG tablet Take 25 mg by mouth 3 (three) times daily as needed.        . metoprolol (TOPROL-XL) 100 MG 24 hr tablet Take 1/2 daily       . Multiple Vitamins-Minerals (ICAPS) TABS Take 1 tablet by mouth 2 (two) times daily.        . potassium chloride (KLOR-CON) 20 MEQ packet Take 20 mEq by mouth 3 (three) times daily.        . Tamsulosin HCl (FLOMAX) 0.4 MG CAPS Take 0.4 mg by mouth daily.        Marland Kitchen telmisartan (MICARDIS) 80 MG tablet Take 80 mg by mouth daily.        Marland Kitchen torsemide (DEMADEX) 20 MG tablet Take 2 in am and 1 in pm        . verapamil (COVERA HS) 240 MG (CO) 24 hr tablet Take 240 mg by mouth at bedtime.        Marland Kitchen warfarin (COUMADIN) 5 MG tablet Take by mouth as directed.        Marland Kitchen DISCONTD: allopurinol (ZYLOPRIM) 100 MG tablet TAKE 1 TABLET BY MOUTH EVERY DAY  30 tablet  4    Allergies  Allergen  Reactions  . Ibuprofen     REACTION: causes low blood pressure    Review of Systems        See HPI - all other systems neg except as noted... The patient complains of decreased hearing, dyspnea on exertion, peripheral edema, and difficulty walking.  The patient denies anorexia, fever, weight loss, weight gain, vision loss, hoarseness, chest pain, syncope, prolonged cough, headaches, hemoptysis, abdominal pain, melena, hematochezia, severe indigestion/heartburn, hematuria, incontinence, muscle weakness, suspicious skin lesions, transient blindness, depression, unusual weight change, abnormal bleeding, enlarged lymph nodes, and angioedema.  Objective:   Physical Exam     WD, WN, 77 y/o BM in NAD... GENERAL:  Alert & oriented; pleasant & cooperative... HEENT:  [place]/AT, EOM-wnl, PERRLA, EACs-clear, TMs-wnl, NOSE-clear, THROAT-clear & wnl. NECK:  Supple w/ fairROM; no JVD; sl decr carotid impulses w/ transmit murmur; no thyromegaly or nodules palpated; no lymphadenopathy. CHEST:  Clear to P & A; without wheezes/ rales/ or rhonchi heard... HEART:  sl irreg, gr 2/6 AS murmur at base, without rubs or gallops apprec... ABDOMEN:  Soft & nontender; normal bowel sounds; no organomegaly or masses detected. EXT: without deformities, mild arthritic changes; no varicose veins/ +venous insuffic/ tr edema. NEURO:  CN's intact; no focal neuro deficits... DERM:  No lesions noted; no rash etc...   Assessment & Plan:   HBP>  Controlled on meds, continue same...  CHF>  Improved renal & stable on current meds...  AS>  Stable on 2DEcho &  followed by DrHochrein...  AFib>  He remains on Coumadin followed in the [facility]...  PA art aneurysm & Pulm HTN>  Stable on current med regimen & not interested in additional eval or Rx...  CHOL>  FLP looks good on Lip20...  Renal Insuffic>  Creat improved to 1.6 & stable on current meds...  DJD/ Gout>  Stable as well, c/o some rib pain as noted; we discussed rest/ heat/  rib binder & Tramadol...  Other medical problems as noted.Marland KitchenMarland Kitchen

## 2011-01-16 NOTE — Patient Instructions (Signed)
Today we updated your med list in EPIC...    We wrote a new prescription for TRAMADOL to use for pain (every 4-6H as needed & you may take it WITH Tylenol to boost it's effect)...  Please return to our lab in the AM for your fasting blood work...    Then please call the [facility] in a few days for your results...    Dial [id number] & when prompted enter your patient number followed by the # symbol...    Your patient number is:  [id number]  Call for any questions...  Let's plan a follow up visit in 6 months, sooner if needed for any problems.Marland KitchenMarland Kitchen

## 2011-01-18 ENCOUNTER — Other Ambulatory Visit (INDEPENDENT_AMBULATORY_CARE_PROVIDER_SITE_OTHER): Payer: Medicare Other

## 2011-01-18 DIAGNOSIS — I10 Essential (primary) hypertension: Secondary | ICD-10-CM

## 2011-01-18 DIAGNOSIS — I4891 Unspecified atrial fibrillation: Secondary | ICD-10-CM

## 2011-01-18 DIAGNOSIS — I509 Heart failure, unspecified: Secondary | ICD-10-CM

## 2011-01-18 DIAGNOSIS — E7800 Pure hypercholesterolemia, unspecified: Secondary | ICD-10-CM

## 2011-01-18 LAB — BASIC METABOLIC PANEL
CO2: 29 mEq/L (ref 19–32)
GFR: 54.48 mL/min — ABNORMAL LOW (ref >60.00)
Glucose, Bld: 114 mg/dL — ABNORMAL HIGH (ref 70–99)
Potassium: 4.1 mEq/L (ref 3.5–5.1)
Sodium: 144 mEq/L (ref 135–145)

## 2011-01-18 LAB — HEPATIC FUNCTION PANEL
AST: 17 U/L (ref 0–37)
Albumin: 4.4 g/dL (ref 3.5–5.2)
Alkaline Phosphatase: 129 U/L — ABNORMAL HIGH (ref 39–117)
Bilirubin, Direct: 0.2 mg/dL (ref 0.0–0.3)

## 2011-01-18 LAB — CBC WITH DIFFERENTIAL/PLATELET
Basophils Absolute: 0 10*3/uL (ref 0.0–0.1)
Eosinophils Absolute: 0.2 10*3/uL (ref 0.0–0.7)
HCT: 42.5 % (ref 39.0–52.0)
Hemoglobin: 14.2 g/dL (ref 13.0–17.0)
Lymphs Abs: 1.4 10*3/uL (ref 0.7–4.0)
MCHC: 33.5 g/dL (ref 30.0–36.0)
Monocytes Absolute: 0.6 10*3/uL (ref 0.1–1.0)
Monocytes Relative: 7.6 % (ref 3.0–12.0)
Neutro Abs: 5.2 10*3/uL (ref 1.4–7.7)
Platelets: 225 10*3/uL (ref 150.0–400.0)
RDW: 16.4 % — ABNORMAL HIGH (ref 11.5–14.6)

## 2011-01-18 LAB — LIPID PANEL
Total CHOL/HDL Ratio: 4
VLDL: 19 mg/dL (ref 0.0–40.0)

## 2011-01-18 LAB — TSH: TSH: 2.64 u[IU]/mL (ref 0.35–5.50)

## 2011-01-19 ENCOUNTER — Ambulatory Visit (INDEPENDENT_AMBULATORY_CARE_PROVIDER_SITE_OTHER): Payer: Medicare Other | Admitting: *Deleted

## 2011-01-19 DIAGNOSIS — Z7901 Long term (current) use of anticoagulants: Secondary | ICD-10-CM

## 2011-01-19 DIAGNOSIS — I4891 Unspecified atrial fibrillation: Secondary | ICD-10-CM

## 2011-01-19 DIAGNOSIS — I6350 Cerebral infarction due to unspecified occlusion or stenosis of unspecified cerebral artery: Secondary | ICD-10-CM

## 2011-01-22 ENCOUNTER — Encounter: Payer: Self-pay | Admitting: Pulmonary Disease

## 2011-01-23 ENCOUNTER — Other Ambulatory Visit: Payer: Self-pay | Admitting: Pulmonary Disease

## 2011-01-25 ENCOUNTER — Telehealth: Payer: Self-pay | Admitting: Pulmonary Disease

## 2011-01-25 MED ORDER — EPLERENONE 25 MG PO TABS: 25.0000 mg | ORAL_TABLET | Freq: Every day | ORAL | Status: DC

## 2011-01-25 NOTE — Telephone Encounter (Signed)
Refill sent pt aware. [place] Sorn, CMA  

## 2011-02-13 ENCOUNTER — Telehealth: Payer: Self-pay | Admitting: Pulmonary Disease

## 2011-02-13 NOTE — Telephone Encounter (Signed)
[id number].Shavette Shoaff, CMA  

## 2011-02-14 ENCOUNTER — Other Ambulatory Visit: Payer: Self-pay | Admitting: Pulmonary Disease

## 2011-02-14 NOTE — Telephone Encounter (Signed)
Spoke with the pt and he states he has a blood blister on his left hand and wanted to know if she should come in for OV for this.  Pt denies any heat to the area, the spot is soft and has not spread. He denies any trauma to the area. I advised the pt to keep an eye on the are and if it becomes painful, develops a knot or redness, or if it grows in size then to call us back. Pt states understanding. Carron Curie, CMA

## 2011-02-15 ENCOUNTER — Other Ambulatory Visit: Payer: Self-pay | Admitting: Pulmonary Disease

## 2011-02-16 ENCOUNTER — Ambulatory Visit (INDEPENDENT_AMBULATORY_CARE_PROVIDER_SITE_OTHER): Payer: Medicare Other | Admitting: *Deleted

## 2011-02-16 DIAGNOSIS — I4891 Unspecified atrial fibrillation: Secondary | ICD-10-CM

## 2011-02-16 DIAGNOSIS — Z7901 Long term (current) use of anticoagulants: Secondary | ICD-10-CM

## 2011-02-16 DIAGNOSIS — I6350 Cerebral infarction due to unspecified occlusion or stenosis of unspecified cerebral artery: Secondary | ICD-10-CM

## 2011-02-20 ENCOUNTER — Other Ambulatory Visit: Payer: Self-pay | Admitting: Pulmonary Disease

## 2011-02-22 ENCOUNTER — Other Ambulatory Visit: Payer: Self-pay | Admitting: Pulmonary Disease

## 2011-02-23 ENCOUNTER — Encounter: Payer: Self-pay | Admitting: Cardiology

## 2011-03-16 ENCOUNTER — Encounter: Payer: Medicare Other | Admitting: *Deleted

## 2011-03-28 ENCOUNTER — Ambulatory Visit: Payer: Medicare Other | Admitting: Cardiology

## 2011-04-18 ENCOUNTER — Other Ambulatory Visit: Payer: Self-pay | Admitting: Cardiology

## 2011-04-21 ENCOUNTER — Encounter: Payer: Self-pay | Admitting: Cardiology

## 2011-04-21 ENCOUNTER — Ambulatory Visit (INDEPENDENT_AMBULATORY_CARE_PROVIDER_SITE_OTHER): Payer: Medicare Other | Admitting: *Deleted

## 2011-04-21 ENCOUNTER — Ambulatory Visit (INDEPENDENT_AMBULATORY_CARE_PROVIDER_SITE_OTHER): Payer: Medicare Other | Admitting: Cardiology

## 2011-04-21 DIAGNOSIS — I10 Essential (primary) hypertension: Secondary | ICD-10-CM

## 2011-04-21 DIAGNOSIS — I350 Nonrheumatic aortic (valve) stenosis: Secondary | ICD-10-CM

## 2011-04-21 DIAGNOSIS — I4891 Unspecified atrial fibrillation: Secondary | ICD-10-CM

## 2011-04-21 DIAGNOSIS — I509 Heart failure, unspecified: Secondary | ICD-10-CM

## 2011-04-21 DIAGNOSIS — I6350 Cerebral infarction due to unspecified occlusion or stenosis of unspecified cerebral artery: Secondary | ICD-10-CM

## 2011-04-21 DIAGNOSIS — Z7901 Long term (current) use of anticoagulants: Secondary | ICD-10-CM

## 2011-04-21 DIAGNOSIS — I359 Nonrheumatic aortic valve disorder, unspecified: Secondary | ICD-10-CM

## 2011-04-21 MED ORDER — VERAPAMIL HCL ER 120 MG PO TBCR: 120.0000 mg | EXTENDED_RELEASE_TABLET | Freq: Every day | ORAL | Status: DC

## 2011-04-21 NOTE — Assessment & Plan Note (Signed)
I will repeat an echocardiogram.

## 2011-04-21 NOTE — Assessment & Plan Note (Signed)
He tolerates coumadin.  His rate is slow.  I will reduce the verapamil.

## 2011-04-21 NOTE — Assessment & Plan Note (Signed)
He seems to be euvolemic.  At this point, no change in therapy is indicated.

## 2011-04-21 NOTE — Assessment & Plan Note (Signed)
I have reviewed many recent BPs.  I do think I have the opportunity to reduce the verapamil as mentioned above.

## 2011-04-21 NOTE — Patient Instructions (Signed)
Please decrease Verapamil to 120 mg once daily Continue all other medications as listed  Your physician has requested that you have an echocardiogram. Echocardiography is a painless test that uses sound waves to create images of your heart. It provides your doctor with information about the size and shape of your heart and how well your heart's chambers and valves are working. This procedure takes approximately one hour. There are no restrictions for this procedure.  Follow up in 6 months with Dr Antoine Poche.  You will receive a letter in the mail 2 months before you are due.  Please call us when you receive this letter to schedule your follow up appointment.

## 2011-04-21 NOTE — Progress Notes (Signed)
HPI The patient presents as followup of his multiple cardiovascular problems. He actually has been doing relatively well. He denies any ongoing chest discomfort, neck or arm discomfort. He rarely has palpitations. He will rarely feel lightheaded but has had no presyncope or syncope. He does occasionally get short of breath and has p.r.n. Oxygen. However, is not describing PND or orthopnea. He's had no weight gain or edema.  Allergies  Allergen Reactions  . Ibuprofen     REACTION: causes low blood pressure    Current Outpatient Prescriptions  Medication Sig Dispense Refill  . allopurinol (ZYLOPRIM) 100 MG tablet Take 1 tablet (100 mg total) by mouth daily.  30 tablet  4  . atorvastatin (LIPITOR) 20 MG tablet TAKE 1 TABLET BY MOUTH DAILY  30 tablet  11  . bimatoprost (LUMIGAN) 0.03 % ophthalmic solution Place 1 drop into both eyes at bedtime.        . brinzolamide (AZOPT) 1 % ophthalmic suspension Place 1 drop into both eyes 2 (two) times daily.        . Cholecalciferol (VITAMIN D) 2000 UNITS CAPS Take 1 capsule by mouth daily.        . cloNIDine (CATAPRES) 0.2 MG tablet Take 1/2 tablet po bid  30 tablet  6  . eplerenone (INSPRA) 25 MG tablet Take 1 tablet (25 mg total) by mouth daily.  30 tablet  6  . meclizine (ANTIVERT) 25 MG tablet Take 25 mg by mouth 3 (three) times daily as needed.        . metoprolol (TOPROL-XL) 100 MG 24 hr tablet Take 1/2 daily       . MICARDIS 80 MG tablet TAKE 1 TABLET BY MOUTH EVERY DAY  30 tablet  6  . Multiple Vitamins-Minerals (ICAPS) TABS Take 1 tablet by mouth 2 (two) times daily.        . pilocarpine (PILOCAR) 1 % ophthalmic solution Place 1 drop into the left eye. Every 4 hrs daily       . potassium chloride SA (K-DUR,KLOR-CON) 20 MEQ tablet Take 60 mEq by mouth daily.        . Tamsulosin HCl (FLOMAX) 0.4 MG CAPS Take 0.4 mg by mouth daily.        . verapamil (CALAN-SR) 120 MG CR tablet Take 1 tablet (120 mg total) by mouth at bedtime.  30 tablet  11  .  warfarin (COUMADIN) 5 MG tablet TAKE AS DIRECTED  90 tablet  1  . torsemide (DEMADEX) 20 MG tablet Take 2 in am and 1 in pm        . traMADol (ULTRAM) 50 MG tablet Take 2 tablets (100 mg total) by mouth every 6 (six) hours as needed for pain.  100 tablet  6    Past Medical History  Diagnosis Date  . Other chronic pulmonary heart diseases   . Unspecified essential hypertension   . Congestive heart failure, unspecified   . Aortic valve disorders   . Atrial fibrillation   . Aneurysm of pulmonary artery   . Pure hypercholesterolemia   . Unspecified disorder resulting from impaired renal function   . Unspecified hemorrhoids without mention of complication   . Unspecified cerebral artery occlusion with cerebral infarction   . Generalized osteoarthrosis, unspecified site   . Gout, unspecified   . Inguinal hernia without mention of obstruction or gangrene, unilateral or unspecified, (not specified as recurrent)   . Pulmonary hypertension   . Aortic stenosis   . Renal insufficiency   .  BPH (benign prostatic hypertrophy)   . CVA (cerebral infarction)   . DJD (degenerative joint disease)     Past Surgical History  Procedure Date  . Appendectomy   . Inguinal hernia repair   . Cataract extraction   . Cosmetic eye surgery     ROS:  As stated in the HPI and negative for all other systems.  PHYSICAL EXAM BP 114/76  Pulse 60  Ht 5\' 11"  (1.803 m)  Wt 207 lb 6.4 oz (94.076 kg)  BMI 28.93 kg/m2 GENERAL:  Well appearing HEENT:  Pupils equal round and reactive, fundi not visualized, oral mucosa unremarkable, upper denture NECK:  No jugular venous distention, waveform within normal limits, carotid upstroke brisk and symmetric, transmitted murmur vs bruit, no thyromegaly LYMPHATICS:  No cervical, inguinal adenopathy LUNGS:  Clear to auscultation bilaterally BACK:  No CVA tenderness CHEST:  Unremarkable HEART:  PMI not displaced or sustained,S1 and S2 within normal limits, no S3, no S4, no  clicks, no rubs, apical mid peaking systolic murmur, irregular ABD:  Flat, positive bowel sounds normal in frequency in pitch, no bruits, no rebound, no guarding, no midline pulsatile mass, no hepatomegaly, no splenomegaly EXT:  2 plus pulses throughout, mild edema, no cyanosis no clubbing SKIN:  No rashes no nodules NEURO:  Cranial nerves II through XII grossly intact, motor grossly intact throughout PSYCH:  Cognitively intact, oriented to person place and time   EKG:  Atrial fibrillation, left axis deviation, rate 60, lateral T wave inversions  ASSESSMENT AND PLAN

## 2011-04-24 NOTE — Telephone Encounter (Signed)
Pt's daughter called and said father had received a call from [place] this am.  Please call her back instead of him with information.  [phone].

## 2011-04-24 NOTE — Telephone Encounter (Signed)
Will forward to [facility] as it appears she called the pt

## 2011-04-27 LAB — LIPID PANEL
Cholesterol: 95
HDL: 36 — ABNORMAL LOW
LDL Cholesterol: 51
Total CHOL/HDL Ratio: 2.6
Triglycerides: 39
VLDL: 8

## 2011-04-27 LAB — CBC
HCT: 33.2 — ABNORMAL LOW
Hemoglobin: 10.7 — ABNORMAL LOW
Platelets: 258
WBC: 8.3

## 2011-04-27 LAB — BASIC METABOLIC PANEL
BUN: 23
BUN: 30 — ABNORMAL HIGH
BUN: 31 — ABNORMAL HIGH
CO2: 33 — ABNORMAL HIGH
Calcium: 8.4
Calcium: 8.6
Calcium: 8.7
Chloride: 96
Creatinine, Ser: 1.47
Creatinine, Ser: 1.48
Creatinine, Ser: 1.6 — ABNORMAL HIGH
GFR calc Af Amer: 55 — ABNORMAL LOW
GFR calc Af Amer: 56 — ABNORMAL LOW
GFR calc non Af Amer: 42 — ABNORMAL LOW
GFR calc non Af Amer: 46 — ABNORMAL LOW
GFR calc non Af Amer: 50 — ABNORMAL LOW
Glucose, Bld: 120 — ABNORMAL HIGH
Glucose, Bld: 99
Potassium: 3.6
Sodium: 136

## 2011-04-27 LAB — PROTIME-INR
INR: 2.1 — ABNORMAL HIGH
INR: 2.3 — ABNORMAL HIGH
INR: 2.3 — ABNORMAL HIGH
INR: 2.5 — ABNORMAL HIGH
Prothrombin Time: 24.3 — ABNORMAL HIGH
Prothrombin Time: 25.7 — ABNORMAL HIGH
Prothrombin Time: 26.2 — ABNORMAL HIGH

## 2011-04-27 LAB — COMPREHENSIVE METABOLIC PANEL
ALT: 11
AST: 13
Albumin: 2.9 — ABNORMAL LOW
Albumin: 3.4 — ABNORMAL LOW
Alkaline Phosphatase: 144 — ABNORMAL HIGH
BUN: 17
BUN: 25 — ABNORMAL HIGH
Calcium: 8.6
Chloride: 105
Chloride: 96
Creatinine, Ser: 1.54 — ABNORMAL HIGH
GFR calc Af Amer: 53 — ABNORMAL LOW
Glucose, Bld: 151 — ABNORMAL HIGH
Potassium: 3.9
Sodium: 141
Total Bilirubin: 1.1
Total Bilirubin: 1.3 — ABNORMAL HIGH
Total Protein: 7.6

## 2011-04-27 LAB — CARDIAC PANEL(CRET KIN+CKTOT+MB+TROPI)
CK, MB: 1.2
CK, MB: 1.5
CK, MB: 1.6
Relative Index: INVALID
Total CK: 87
Total CK: 94
Troponin I: 0.02
Troponin I: 0.03

## 2011-04-27 LAB — B-NATRIURETIC PEPTIDE (CONVERTED LAB): Pro B Natriuretic peptide (BNP): 1254 — ABNORMAL HIGH

## 2011-04-28 LAB — BASIC METABOLIC PANEL
BUN: 14
CO2: 38 — ABNORMAL HIGH
Chloride: 93 — ABNORMAL LOW
Creatinine, Ser: 1.1
Glucose, Bld: 110 — ABNORMAL HIGH

## 2011-04-28 LAB — PROTIME-INR: INR: 1.7 — ABNORMAL HIGH

## 2011-05-08 ENCOUNTER — Other Ambulatory Visit: Payer: Self-pay | Admitting: Pulmonary Disease

## 2011-05-10 ENCOUNTER — Encounter: Payer: Self-pay | Admitting: *Deleted

## 2011-05-17 ENCOUNTER — Encounter: Payer: Medicare Other | Admitting: *Deleted

## 2011-05-17 ENCOUNTER — Ambulatory Visit (INDEPENDENT_AMBULATORY_CARE_PROVIDER_SITE_OTHER): Payer: Medicare Other | Admitting: *Deleted

## 2011-05-17 ENCOUNTER — Ambulatory Visit (HOSPITAL_COMMUNITY): Payer: Medicare Other | Attending: Cardiology | Admitting: Radiology

## 2011-05-17 DIAGNOSIS — I2789 Other specified pulmonary heart diseases: Secondary | ICD-10-CM | POA: Insufficient documentation

## 2011-05-17 DIAGNOSIS — Z7901 Long term (current) use of anticoagulants: Secondary | ICD-10-CM

## 2011-05-17 DIAGNOSIS — I6350 Cerebral infarction due to unspecified occlusion or stenosis of unspecified cerebral artery: Secondary | ICD-10-CM

## 2011-05-17 DIAGNOSIS — F17200 Nicotine dependence, unspecified, uncomplicated: Secondary | ICD-10-CM | POA: Insufficient documentation

## 2011-05-17 DIAGNOSIS — I4891 Unspecified atrial fibrillation: Secondary | ICD-10-CM

## 2011-05-17 DIAGNOSIS — E7800 Pure hypercholesterolemia, unspecified: Secondary | ICD-10-CM | POA: Insufficient documentation

## 2011-05-17 DIAGNOSIS — I350 Nonrheumatic aortic (valve) stenosis: Secondary | ICD-10-CM

## 2011-05-17 DIAGNOSIS — I10 Essential (primary) hypertension: Secondary | ICD-10-CM | POA: Insufficient documentation

## 2011-05-17 DIAGNOSIS — I281 Aneurysm of pulmonary artery: Secondary | ICD-10-CM | POA: Insufficient documentation

## 2011-05-17 DIAGNOSIS — I359 Nonrheumatic aortic valve disorder, unspecified: Secondary | ICD-10-CM | POA: Insufficient documentation

## 2011-05-17 DIAGNOSIS — I509 Heart failure, unspecified: Secondary | ICD-10-CM | POA: Insufficient documentation

## 2011-05-17 DIAGNOSIS — N289 Disorder of kidney and ureter, unspecified: Secondary | ICD-10-CM | POA: Insufficient documentation

## 2011-05-17 DIAGNOSIS — M1990 Unspecified osteoarthritis, unspecified site: Secondary | ICD-10-CM | POA: Insufficient documentation

## 2011-05-19 ENCOUNTER — Encounter: Payer: Medicare Other | Admitting: *Deleted

## 2011-05-19 LAB — I-STAT 8, (EC8 V) (CONVERTED LAB)
BUN: 46 — ABNORMAL HIGH
Chloride: 96
Glucose, Bld: 102 — ABNORMAL HIGH
Hemoglobin: 13.3
Potassium: 3 — ABNORMAL LOW
Sodium: 139
TCO2: 42

## 2011-06-13 ENCOUNTER — Ambulatory Visit (INDEPENDENT_AMBULATORY_CARE_PROVIDER_SITE_OTHER): Payer: Medicare Other | Admitting: *Deleted

## 2011-06-13 DIAGNOSIS — Z7901 Long term (current) use of anticoagulants: Secondary | ICD-10-CM

## 2011-06-13 DIAGNOSIS — I4891 Unspecified atrial fibrillation: Secondary | ICD-10-CM

## 2011-06-13 DIAGNOSIS — I6350 Cerebral infarction due to unspecified occlusion or stenosis of unspecified cerebral artery: Secondary | ICD-10-CM

## 2011-06-16 ENCOUNTER — Ambulatory Visit (INDEPENDENT_AMBULATORY_CARE_PROVIDER_SITE_OTHER): Payer: Medicare Other

## 2011-06-16 DIAGNOSIS — Z23 Encounter for immunization: Secondary | ICD-10-CM

## 2011-07-02 ENCOUNTER — Other Ambulatory Visit: Payer: Self-pay | Admitting: Cardiology

## 2011-07-04 ENCOUNTER — Ambulatory Visit (INDEPENDENT_AMBULATORY_CARE_PROVIDER_SITE_OTHER): Payer: Medicare Other | Admitting: *Deleted

## 2011-07-04 DIAGNOSIS — I4891 Unspecified atrial fibrillation: Secondary | ICD-10-CM

## 2011-07-04 DIAGNOSIS — Z7901 Long term (current) use of anticoagulants: Secondary | ICD-10-CM

## 2011-07-04 DIAGNOSIS — I6350 Cerebral infarction due to unspecified occlusion or stenosis of unspecified cerebral artery: Secondary | ICD-10-CM

## 2011-07-04 LAB — POCT INR: INR: 2.3

## 2011-07-19 ENCOUNTER — Encounter: Payer: Self-pay | Admitting: Pulmonary Disease

## 2011-07-19 ENCOUNTER — Ambulatory Visit (INDEPENDENT_AMBULATORY_CARE_PROVIDER_SITE_OTHER): Payer: Medicare Other | Admitting: Pulmonary Disease

## 2011-07-19 ENCOUNTER — Other Ambulatory Visit (INDEPENDENT_AMBULATORY_CARE_PROVIDER_SITE_OTHER): Payer: Medicare Other

## 2011-07-19 DIAGNOSIS — E7800 Pure hypercholesterolemia, unspecified: Secondary | ICD-10-CM

## 2011-07-19 DIAGNOSIS — I281 Aneurysm of pulmonary artery: Secondary | ICD-10-CM

## 2011-07-19 DIAGNOSIS — I4891 Unspecified atrial fibrillation: Secondary | ICD-10-CM

## 2011-07-19 DIAGNOSIS — I10 Essential (primary) hypertension: Secondary | ICD-10-CM

## 2011-07-19 DIAGNOSIS — I2589 Other forms of chronic ischemic heart disease: Secondary | ICD-10-CM

## 2011-07-19 DIAGNOSIS — N259 Disorder resulting from impaired renal tubular function, unspecified: Secondary | ICD-10-CM

## 2011-07-19 DIAGNOSIS — M159 Polyosteoarthritis, unspecified: Secondary | ICD-10-CM

## 2011-07-19 DIAGNOSIS — I6350 Cerebral infarction due to unspecified occlusion or stenosis of unspecified cerebral artery: Secondary | ICD-10-CM

## 2011-07-19 DIAGNOSIS — I359 Nonrheumatic aortic valve disorder, unspecified: Secondary | ICD-10-CM

## 2011-07-19 DIAGNOSIS — R0989 Other specified symptoms and signs involving the circulatory and respiratory systems: Secondary | ICD-10-CM

## 2011-07-19 LAB — BASIC METABOLIC PANEL
CO2: 31 mEq/L (ref 19–32)
Chloride: 106 mEq/L (ref 96–112)
Creatinine, Ser: 1.6 mg/dL — ABNORMAL HIGH (ref 0.4–1.5)
Glucose, Bld: 99 mg/dL (ref 70–99)

## 2011-07-19 NOTE — Progress Notes (Signed)
Subjective:    Patient ID: [name] [name], male    DOB: 1926-06-12, 77 y.o.   MRN: [id number]  HPI 77 y/o BM here for a follow up visit... he has multiple medical problems including HBP, CAD, AS, AFib, & CHF followed by DrHochrein & the CHFclinic;  known PulmHTN & PA aneurysm conservatively managed by DrGearhardt;  Hyperchol;  RI & BPH;  DJD & Gout...  ~  January 17, 2010:  last visit we added Aldactone 25mg /d hoping to spare him some of the six KCl tabs he takes daily due to his Demadex requirement... this helped a little & he is down to 4 KCl per day but he developed gynecomastia & [name] switched him to Eplerenone 25mg ... he has had some diminution of the gynecomastia & tenderness- and still on 4 Demadex & 4 KCl (overall it hardly seems worth the Inspra cost to save 2KCl tabs per day- but he wants to continue & ask DrHochrein at his next follow up)... BP controlled on his now 6-7 meds! & due for f/u in the CHF clinic... he remains in AFib on coumadin... Chol controlled w/ Simva40... renal funct & PSA stable on labs within the last 45mo...   ~  July 18, 2010:  he is "hanging in there" w/ his mult problems at age 73... no ch in med regimen & daugh does his meds for him...  denies CP, palpit, SOB, edema, etc... he saw DrHochrein 6/11> chr DOE, no change, same meds... he states that DrGearhardt has "released me" & he's due for a f/u CXR (no change)... Lipids OK on Simva40;  renal function sl worse w/ diuretics but BNP=535;  otherw stable.  ~  January 16, 2011:  45mo ROV & he reports visual problems> has cat surg & glaucoma on drops now; also c/o rib pain (right lower ribs)/ CWP & we discussed Rx w/ rest/ heat/ rib binder/ & Tramadol...    He was seen by DrHochrein 12/11 for f/u CHF clinic & he decr his Demadex from [facility] to 3 per day due to incr BUN 32 & Creat 1.8; AFib on Coumadin; HBP controlled on meds, & AS> he did f/u 2DEcho showing mild LVH, norm sys function w/ EF=55-60% & no regional wall  motion abn, mod AS, dilated LA&RA, modTR, PAsys=50...    Due for Fasting blood work> FLP looks good on Lip20, Chems stable on the 3 Demadex /d, continue same meds...  ~  July 19, 2011:  45mo ROV & his CC is poor energy and some DOE but he is too sedentary for too long & we discussed gradual exercise program to incr his stamina;  He saw DrHochrein for Cards f/u 9/12> doing well, rare episode palpit, occas SOB using O2 prn; he reduced his Verapamil dose to 120mg /d, continue Coumadin, & other meds... Repeat 2DEcho 10/12 showed mod LVH, norm sys function w/ EF=55-60% & norm wall motion, Gr2DD, severely calcif AoV leaflets w/ mod AS & 25mm mean gradient (peak=39), mild MR, severe LAdil & RAdil, RV sys function normal, modTR, PAsys=43...  LABS today ok w/ BUN=21, Creat=1.6, K=3.9, BNP=371...          Problem List:  OPHTHALMOLOGY - s/p cataract surg & followed for mild Glaucoma on gtts daily...  PULMONARY HYPERTENSION (ICD-416.8) - on home oxygen 2L/min "Prn"... ~  Prev 2DEcho w/ est PAsys= 64... he has not had a right heart cath... ~  Repeat 2DEcho 10/12 showed mod LVH, norm sys function w/ EF=55-60% & norm wall  motion, Gr2DD, severely calcif AoV leaflets w/ mod AS & 25mm mean gradient (peak=39), mild MR, severe LAdil & RAdil, RV sys function normal, modTR, PAsys=43...  HYPERTENSION (ICD-401.9) - on METOPROLOL ER 100mg - 1/2 tab daily,  VERAPAMIL 240mg /d,  MICARDIS 80mg /d,  CLONIDINE 0.2mg - 1/2 Bid,  DEMADEX 20mg - 2am & 1pm,  EPLERENONE 25mg /d, & K20- 3/d... he was prev on Minoxidil  but this was  discontinued by Cardiology... BP= 126/72 today and not checking at home, tolerating meds well... denies HA, visual changes, CP, palipit, dizziness, syncope... he has chr DOE without change and mild edema... ~  6/10: DrHochrein added back the Clonidine & asked him, again, to monitor BP at home. ~  12/10:  they requested trial to decr K20's from 6/d required due to his Demedex... suggest adding ALDACTONE 25mg /d &  decr K20 to 4/d w/ f/u labs... ~  6/11:  Aldactone caused gynecomastia & was switched to EPLERENONE 25mg /d by [name] w/ some improvment in side effect. ~  6/12:  BP controlled & labs sl improved w/ the Demadex decr to 3/d...  CHF (ICD-428.0) - see meds above... followed in the CHF Clinic by DrHochrein;  he has severe Diastolic CHF, w/ normal C's by cath in 2005, & normal LV systolic function w/ EF=75%... notes DOE- no change. ~  labs 6/09 showed BNP= 761 ~  labs 9/09 showed BNP= 486 ~  labs 12/09 showed BNP = 568 ~  labs 6/10 showed BNP= 421 ~  labs 12/10 showed BNP= 577... adding SPIRONOLACTONE 25mg /d. ~  labs 1/11 showed BNP= 395... later ch to EPLERENONE 25mg /d. ~  labs 12/11 showed BNP= 535 ~  Labs 6/12 showed BNP= 524 (on Demedex3/d + Eplerenone25mg /d). ~  Labs 12/12 showed BNP= 371  AORTIC STENOSIS (ICD-424.1) - moderate by 2DEcho & followed by DrHochrein... ~  repeat 2DEcho 1/09 hosp = similiar mod AS... ~  Repeat 2DEcho 1/12 showed mild LVH, norm sys function w/ EF=55-60% & no regional wall motion abn, mod AS, dilated LA&RA, modTR, PAsys=50... ~  Repeat 2DEcho 10/12 showed mod LVH, norm sys function w/ EF=55-60% & norm wall motion, Gr2DD, severely calcif AoV leaflets w/ mod AS & 25mm mean gradient (peak=39), mild MR, severe LAdil & RAdil, RV sys function normal, modTR, PAsys=43...  ATRIAL FIBRILLATION (ICD-427.31) - on COUMADIN & followed in the Coumadin Clinic.  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 ~  CXR 1/09 showed unchanged right PA aneurysm, stable cardiomeg, elevated left hemidiaph w/ atx. ~  CXR 6/10 showed no change, Ao tortuous and calcif, NAD. ~  CXR 12/11 showed stable elv left hemidiaph, bibasilar atx/ scarring, cardiomeg, ectatic ao, right PA enlargement.  HYPERCHOLESTEROLEMIA (ICD-272.0) - now on LIPITOR 20mg /d (prev Simva40 changed due to concomitant Verap). ~  FLP 1/09 on Simva40 showed TChol  95, TG 39, HDL 36, LDL 51 ~  FLP 12/09 on Simva40 showed TChol 167, TG 68, HDL 64, LDL 90 ~  FLP 1/11 on Simva40 showed TChol 153, TG 101, HDL 50, LDL 83... later ch to Lip20. ~  FLP 6/12 on Lip20 showed TChol 162, TG 95, HDL 46, LDL 97  HEMORRHOIDS (ICD-455.6)  RENAL INSUFFICIENCY (ICD-588.9)  ~  Creat=1.4 - 1.6 during 1/09 hosp... ~  labs 6/09 & 9/09 showed Creat= 1.3 ~  labs 12/09 showed BUN= 20, Creat= 1.3, K= 4.0 ~  labs 6/10 showed BUN= 24, Creat= 1.3, K= 3.8 ~  labs 12/10 showed BUN= 12, Creat=  1.2, K= 3.6 (on 6 K20/d) ~  labs 4/11 showed BUN= 18, Creat= 1.4, K= 4.0 (on 4 KCl/d) ~  labs 12/11 showed BUN= 32, Creat= 1.8, K= 4.3 ~  Labs 6/12 showed BUN= 27, creat= 1.6, K= 4.1 ~  Labs 12/12 showed BUN=21, Creat=1.6, K=3.9  BENIGN PROSTATIC HYPERTROPHY, HX OF (ICD-V13.8) - followed by DrTannenbaum & taking FLOMAX 0.4mg /d... last seen 5/09 and his note is reviewed... ~  labs 12/09 showed PSA= 1.42 ~  labs 2/11 showed PSA= 1.65  CVA (ICD-434.91) - Stroke in 1990 in the setting of HBP...  DEGENERATIVE JOINT DISEASE, GENERALIZED (ICD-715.00) - he uses OTC pain meds Prn...  GOUT (ICD-274.9) - on ALLOPURINOL 100mg /d,  & now off the Colchicine... ~  labs 10/08 showed Uric= 10.3 ~  labs 12/09 showed Uric= 7.7.Marland Kitchen. he wants to continue same meds. ~  labs 12/11 showed Uric = 6.4  INGUINAL HERNIA (ICD-550.90)  Health Maintenance - he receives the yearly seasonal Flu vaccines... f/u PNEUMOVAX at his request 2010...   Past Surgical History  Procedure Date  . Appendectomy   . Inguinal hernia repair   . Cataract extraction   . Cosmetic eye surgery     Outpatient Encounter Prescriptions as of 07/19/2011  Medication Sig Dispense Refill  . allopurinol (ZYLOPRIM) 100 MG tablet TAKE 1 TABLET BY MOUTH EVERY DAY  30 tablet  3  . atorvastatin (LIPITOR) 20 MG tablet TAKE 1 TABLET BY MOUTH DAILY  30 tablet  11  . bimatoprost (LUMIGAN) 0.03 % ophthalmic solution Place 1 drop into both eyes at  bedtime.        . brinzolamide (AZOPT) 1 % ophthalmic suspension Place 1 drop into both eyes 2 (two) times daily.        . Cholecalciferol (VITAMIN D) 2000 UNITS CAPS Take 1 capsule by mouth daily.        . cloNIDine (CATAPRES) 0.2 MG tablet TAKE 1/2 TABLET BY MOUTH TWICE DAILY  30 tablet  5  . eplerenone (INSPRA) 25 MG tablet Take 1 tablet (25 mg total) by mouth daily.  30 tablet  6  . meclizine (ANTIVERT) 25 MG tablet Take 25 mg by mouth 3 (three) times daily as needed.        . metoprolol (TOPROL-XL) 100 MG 24 hr tablet Take 1/2 daily       . MICARDIS 80 MG tablet TAKE 1 TABLET BY MOUTH EVERY DAY  30 tablet  6  . Multiple Vitamins-Minerals (ICAPS) TABS Take 1 tablet by mouth 2 (two) times daily.        . pilocarpine (PILOCAR) 1 % ophthalmic solution Place 1 drop into the left eye. Every 4 hrs daily       . potassium chloride SA (K-DUR,KLOR-CON) 20 MEQ tablet Take 60 mEq by mouth daily.        . Tamsulosin HCl (FLOMAX) 0.4 MG CAPS Take 0.4 mg by mouth daily.        Marland Kitchen torsemide (DEMADEX) 20 MG tablet Take 2 in am and 1 in pm        . traMADol (ULTRAM) 50 MG tablet Take 2 tablets (100 mg total) by mouth every 6 (six) hours as needed for pain.  100 tablet  6  . verapamil (CALAN-SR) 120 MG CR tablet Take 1 tablet (120 mg total) by mouth at bedtime.  30 tablet  11  . warfarin (COUMADIN) 5 MG tablet TAKE AS DIRECTED  90 tablet  1    Allergies  Allergen Reactions  .  Ibuprofen     REACTION: causes low blood pressure    Current Medications, Allergies, Past Medical History, Past Surgical History, Family History, and Social History were reviewed in [employer] record.    Review of Systems        See HPI - all other systems neg except as noted... The patient complains of decreased hearing, dyspnea on exertion, peripheral edema, and difficulty walking.  The patient denies anorexia, fever, weight loss, weight gain, vision loss, hoarseness, chest pain, syncope, prolonged  cough, headaches, hemoptysis, abdominal pain, melena, hematochezia, severe indigestion/heartburn, hematuria, incontinence, muscle weakness, suspicious skin lesions, transient blindness, depression, unusual weight change, abnormal bleeding, enlarged lymph nodes, and angioedema.  Objective:   Physical Exam     WD, WN, 77 y/o BM in NAD... GENERAL:  Alert & oriented; pleasant & cooperative... HEENT:  [place]/AT, EOM-wnl, PERRLA, EACs-clear, TMs-wnl, NOSE-clear, THROAT-clear & wnl. NECK:  Supple w/ fairROM; no JVD; sl decr carotid impulses w/ transmit murmur; no thyromegaly or nodules palpated; no lymphadenopathy. CHEST:  Clear to P & A; without wheezes/ rales/ or rhonchi heard... HEART:  sl irreg, gr 2/6 AS murmur at base, without rubs or gallops apprec... ABDOMEN:  Soft & nontender; normal bowel sounds; no organomegaly or masses detected. EXT: without deformities, mild arthritic changes; no varicose veins/ +venous insuffic/ tr edema. NEURO:  CN's intact; no focal neuro deficits... DERM:  No lesions noted; no rash etc...  RADIOLOGY DATA:  Reviewed in the EPIC EMR & discussed w/ the patient...    >>CXR 12/11 showed stable elv left hemidiaph, bibasilar atx/ scarring, cardiomeg, ectatic ao, right PA enlargement.  LABORATORY DATA:  Reviewed in the EPIC EMR & discussed w/ the patient...    >>LABS today ok w/ BUN=21, Creat=1.6, K=3.9, BNP=371...   Assessment & Plan:   HBP>  Controlled on meds, continue same...  CHF>  Improved renal & stable on current meds...  AS>  Stable on 2DEcho & followed by DrHochrein...  AFib>  He remains on Coumadin followed in the CC...  PA art aneurysm & Pulm HTN>  Stable on current med regimen & not interested in additional eval or Rx...  CHOL>  FLP looks good on Lip20...  Renal Insuffic>  Creat improved to 1.6 & stable on current meds...  DJD/ Gout>  Stable as well, c/o some rib pain as noted; we discussed rest/ heat/ rib binder & Tramadol...  Other medical  problems as noted.Marland KitchenMarland Kitchen

## 2011-07-19 NOTE — Patient Instructions (Signed)
Today we updated your med list in our EPIC system...    Continue your current medications the same...  Today we did your follow up blood work...    Please call the PHONE [facility]REE in a few days for your results...    Dial [id number] & when prompted enter your patient number followed by the # symbol...    Your patient number is:  [id number]#  Try to increase your activity level/ EXERCISE...  Call for any questions...  Let's plan a follow up visit in 6 months, sooner if needed for problems.Marland KitchenMarland Kitchen

## 2011-07-25 ENCOUNTER — Other Ambulatory Visit: Payer: Self-pay | Admitting: Cardiology

## 2011-08-02 ENCOUNTER — Encounter: Payer: Medicare Other | Admitting: *Deleted

## 2011-08-06 ENCOUNTER — Encounter: Payer: Self-pay | Admitting: Pulmonary Disease

## 2011-08-14 ENCOUNTER — Ambulatory Visit (INDEPENDENT_AMBULATORY_CARE_PROVIDER_SITE_OTHER): Payer: Medicare Other | Admitting: *Deleted

## 2011-08-14 ENCOUNTER — Other Ambulatory Visit: Payer: Self-pay | Admitting: Pulmonary Disease

## 2011-08-14 DIAGNOSIS — I4891 Unspecified atrial fibrillation: Secondary | ICD-10-CM

## 2011-08-14 DIAGNOSIS — Z7901 Long term (current) use of anticoagulants: Secondary | ICD-10-CM

## 2011-08-14 DIAGNOSIS — I6350 Cerebral infarction due to unspecified occlusion or stenosis of unspecified cerebral artery: Secondary | ICD-10-CM

## 2011-08-14 LAB — POCT INR: INR: 2

## 2011-08-28 ENCOUNTER — Other Ambulatory Visit: Payer: Self-pay | Admitting: Cardiology

## 2011-08-29 ENCOUNTER — Other Ambulatory Visit: Payer: Self-pay

## 2011-09-06 ENCOUNTER — Other Ambulatory Visit: Payer: Self-pay | Admitting: Cardiology

## 2011-09-06 ENCOUNTER — Other Ambulatory Visit: Payer: Self-pay | Admitting: Pulmonary Disease

## 2011-09-06 ENCOUNTER — Telehealth: Payer: Self-pay | Admitting: Pulmonary Disease

## 2011-09-06 MED ORDER — TORSEMIDE 20 MG PO TABS: ORAL_TABLET | ORAL | Status: DC

## 2011-09-06 NOTE — Telephone Encounter (Signed)
Refill sent. Pt daughter is aware.  Jennifer Castillo, CMA   

## 2011-09-11 ENCOUNTER — Ambulatory Visit (INDEPENDENT_AMBULATORY_CARE_PROVIDER_SITE_OTHER): Payer: Medicare Other | Admitting: *Deleted

## 2011-09-11 DIAGNOSIS — I6350 Cerebral infarction due to unspecified occlusion or stenosis of unspecified cerebral artery: Secondary | ICD-10-CM

## 2011-09-11 DIAGNOSIS — Z7901 Long term (current) use of anticoagulants: Secondary | ICD-10-CM

## 2011-09-11 DIAGNOSIS — I4891 Unspecified atrial fibrillation: Secondary | ICD-10-CM

## 2011-09-12 ENCOUNTER — Other Ambulatory Visit: Payer: Self-pay | Admitting: Pulmonary Disease

## 2011-10-09 ENCOUNTER — Encounter: Payer: Medicare Other | Admitting: *Deleted

## 2011-10-16 ENCOUNTER — Ambulatory Visit (INDEPENDENT_AMBULATORY_CARE_PROVIDER_SITE_OTHER): Payer: Medicare Other | Admitting: Pharmacist

## 2011-10-16 DIAGNOSIS — I4891 Unspecified atrial fibrillation: Secondary | ICD-10-CM

## 2011-10-16 DIAGNOSIS — Z7901 Long term (current) use of anticoagulants: Secondary | ICD-10-CM

## 2011-10-16 DIAGNOSIS — I6350 Cerebral infarction due to unspecified occlusion or stenosis of unspecified cerebral artery: Secondary | ICD-10-CM

## 2011-10-30 ENCOUNTER — Telehealth: Payer: Self-pay | Admitting: Pulmonary Disease

## 2011-10-30 NOTE — Telephone Encounter (Signed)
i've reviewed pt's chart in epic and centricity and do not see any documentation of this.  Will request pt's paper chart.

## 2011-10-31 NOTE — Telephone Encounter (Signed)
Pt's paper chart has been checked and I do not see where this patient has ever received a tetanus shot here at our office. The pt says his home nurse says he needs to get one. Can we bring the pt in just for the tetanus injection and place appt on the allergy schedule? SN, pls advise.

## 2011-10-31 NOTE — Telephone Encounter (Signed)
Per SN---looks like its been greater than 10 years.  Ok to have shot.

## 2011-10-31 NOTE — Telephone Encounter (Signed)
Chart still not here. Sent another request for this. Chart reading to [facility]. Will await chart.

## 2011-10-31 NOTE — Telephone Encounter (Signed)
Spoke with pt and notified of recs per SN. He is going to come in Thurs 3/28 for inj. Placed on injection schedule.

## 2011-11-02 ENCOUNTER — Ambulatory Visit (INDEPENDENT_AMBULATORY_CARE_PROVIDER_SITE_OTHER): Payer: Medicare Other

## 2011-11-02 DIAGNOSIS — Z23 Encounter for immunization: Secondary | ICD-10-CM

## 2011-11-06 ENCOUNTER — Other Ambulatory Visit: Payer: Self-pay | Admitting: Cardiology

## 2011-11-28 ENCOUNTER — Telehealth: Payer: Self-pay | Admitting: Pulmonary Disease

## 2011-11-28 NOTE — Telephone Encounter (Signed)
Received copies from [facility] ,on 11/28/11 . Forwarded 3  pages to Dr. Kriste Basque ,for review.

## 2011-12-05 ENCOUNTER — Ambulatory Visit (INDEPENDENT_AMBULATORY_CARE_PROVIDER_SITE_OTHER): Payer: Medicare Other | Admitting: Pharmacist

## 2011-12-05 ENCOUNTER — Encounter: Payer: Self-pay | Admitting: Cardiology

## 2011-12-05 ENCOUNTER — Ambulatory Visit (INDEPENDENT_AMBULATORY_CARE_PROVIDER_SITE_OTHER): Payer: Medicare Other | Admitting: Cardiology

## 2011-12-05 VITALS — BP 130/90 | HR 82 | Ht 71.0 in | Wt 207.8 lb

## 2011-12-05 DIAGNOSIS — I10 Essential (primary) hypertension: Secondary | ICD-10-CM

## 2011-12-05 DIAGNOSIS — I359 Nonrheumatic aortic valve disorder, unspecified: Secondary | ICD-10-CM

## 2011-12-05 DIAGNOSIS — I6529 Occlusion and stenosis of unspecified carotid artery: Secondary | ICD-10-CM

## 2011-12-05 DIAGNOSIS — I2789 Other specified pulmonary heart diseases: Secondary | ICD-10-CM

## 2011-12-05 DIAGNOSIS — I4891 Unspecified atrial fibrillation: Secondary | ICD-10-CM

## 2011-12-05 DIAGNOSIS — Z7901 Long term (current) use of anticoagulants: Secondary | ICD-10-CM

## 2011-12-05 DIAGNOSIS — I6350 Cerebral infarction due to unspecified occlusion or stenosis of unspecified cerebral artery: Secondary | ICD-10-CM

## 2011-12-05 NOTE — Progress Notes (Signed)
HPI The patient presents as followup of his multiple cardiovascular problems. He actually has been doing relatively well. When I saw him last fall it would her to an echocardiogram which demonstrates that he has had progression of his aortic stenosis. He comes today and reports that he occasionally will get some fatigue it improves after he drinks caffeine or needs sugar. He will have occasional shortness of breath. However, he denies any PND or orthopnea. He denies any chest pressure, neck or arm discomfort. He has no presyncope or syncope. He does occasionally feel palpitations but this does not particularly bother him.  Allergies  Allergen Reactions  . Ibuprofen     REACTION: causes low blood pressure    Current Outpatient Prescriptions  Medication Sig Dispense Refill  . allopurinol (ZYLOPRIM) 100 MG tablet TAKE 1 TABLET BY MOUTH EVERY DAY  30 tablet  3  . atorvastatin (LIPITOR) 20 MG tablet TAKE 1 TABLET BY MOUTH DAILY  30 tablet  11  . bimatoprost (LUMIGAN) 0.03 % ophthalmic solution Place 1 drop into both eyes at bedtime.        . brinzolamide (AZOPT) 1 % ophthalmic suspension Place 1 drop into both eyes 2 (two) times daily.        . Cholecalciferol (VITAMIN D) 2000 UNITS CAPS Take 1 capsule by mouth daily.        . cloNIDine (CATAPRES) 0.2 MG tablet TAKE 1/2 TABLET BY MOUTH TWICE DAILY  30 tablet  5  . eplerenone (INSPRA) 25 MG tablet TAKE 1 TABLET BY MOUTH EVERY DAY  30 tablet  5  . meclizine (ANTIVERT) 25 MG tablet Take 25 mg by mouth 3 (three) times daily as needed.        . metoprolol (TOPROL-XL) 100 MG 24 hr tablet Take 1/2 daily       . MICARDIS 80 MG tablet TAKE 1 TABLET BY MOUTH EVERY DAY  30 tablet  6  . Multiple Vitamins-Minerals (ICAPS) TABS Take 1 tablet by mouth 2 (two) times daily.        . pilocarpine (PILOCAR) 1 % ophthalmic solution Place 1 drop into the left eye. Every 4 hrs daily       . potassium chloride SA (K-DUR,KLOR-CON) 20 MEQ tablet Take 3 tablets (60 mEq  total) by mouth daily.  270 tablet  7  . Tamsulosin HCl (FLOMAX) 0.4 MG CAPS Take 0.4 mg by mouth daily.        Marland Kitchen torsemide (DEMADEX) 20 MG tablet Take 2 in am and 1 in pm  90 tablet  3  . traMADol (ULTRAM) 50 MG tablet Take 2 tablets (100 mg total) by mouth every 6 (six) hours as needed for pain.  100 tablet  6  . verapamil (CALAN-SR) 120 MG CR tablet Take 1 tablet (120 mg total) by mouth at bedtime.  30 tablet  11  . warfarin (COUMADIN) 5 MG tablet TAKE AS DIRECTED  90 tablet  1    Past Medical History  Diagnosis Date  . Other chronic pulmonary heart diseases   . Unspecified essential hypertension   . Congestive heart failure, unspecified   . Aortic valve disorders   . Atrial fibrillation   . Aneurysm of pulmonary artery   . Pure hypercholesterolemia   . Unspecified disorder resulting from impaired renal function   . Unspecified hemorrhoids without mention of complication   . Unspecified cerebral artery occlusion with cerebral infarction   . Generalized osteoarthrosis, unspecified site   . Gout, unspecified   .  Inguinal hernia without mention of obstruction or gangrene, unilateral or unspecified, (not specified as recurrent)   . Pulmonary hypertension   . Aortic stenosis   . Renal insufficiency   . BPH (benign prostatic hypertrophy)   . CVA (cerebral infarction)   . DJD (degenerative joint disease)     Past Surgical History  Procedure Date  . Appendectomy   . Inguinal hernia repair   . Cataract extraction   . Cosmetic eye surgery     ROS:  As stated in the HPI and negative for all other systems.  PHYSICAL EXAM BP 130/90  Pulse 82  Ht 5\' 11"  (1.803 m)  Wt 207 lb 12.8 oz (94.257 kg)  BMI 28.98 kg/m2 GENERAL:  Well appearing HEENT:  Pupils equal round and reactive, fundi not visualized, oral mucosa unremarkable, upper denture NECK:  No jugular venous distention, waveform within normal limits, carotid upstroke brisk and symmetric, transmitted murmur vs bruit, no  thyromegaly LYMPHATICS:  No cervical, inguinal adenopathy LUNGS:  Clear to auscultation bilaterally BACK:  No CVA tenderness CHEST:  Unremarkable HEART:  PMI not displaced or sustained,S1 and S2 within normal limits, no S3, no S4, no clicks, no rubs, apical mid peaking systolic murmur, irregular ABD:  Flat, positive bowel sounds normal in frequency in pitch, no bruits, no rebound, no guarding, no midline pulsatile mass, no hepatomegaly, no splenomegaly EXT:  2 plus pulses throughout, mild edema, no cyanosis no clubbing SKIN:  No rashes no nodules NEURO:  Cranial nerves II through XII grossly intact, motor grossly intact throughout PSYCH:  Cognitively intact, oriented to person place and time  EKG:  Atrial fibrillation, left axis deviation, rate 82, lateral T wave inversions, no change from previous. 12/05/2011  ASSESSMENT AND PLAN

## 2011-12-05 NOTE — Patient Instructions (Signed)
The current medical regimen is effective;  continue present plan and medications.  Your physician has requested that you have a carotid duplex in October. This test is an ultrasound of the carotid arteries in your neck. It looks at blood flow through these arteries that supply the brain with blood. Allow one hour for this exam. There are no restrictions or special instructions.  Please follow up with Dr Antoine Poche in October 2013

## 2011-12-05 NOTE — Assessment & Plan Note (Signed)
The patient  tolerates this rhythm and rate control and anticoagulation. We will continue with the meds as listed.  

## 2011-12-05 NOTE — Assessment & Plan Note (Signed)
I had a long discussion with the patient and his daughter about his AS.  At this point they would prefer conservative therapy unless he becomes more symptomatic at which point he might be a TAVR candidate. I will repeat an echocardiogram in October and see him at that time or sooner if he has any further symptoms. We talked about salt and fluid restriction.

## 2011-12-05 NOTE — Assessment & Plan Note (Signed)
He does have pulmonary hypertension and pulmonary artery aneurysm which is being managed conservatively.

## 2011-12-05 NOTE — Assessment & Plan Note (Signed)
The blood pressure is at target. No change in medications is indicated. We will continue with therapeutic lifestyle changes (TLC).  

## 2011-12-13 ENCOUNTER — Other Ambulatory Visit: Payer: Self-pay | Admitting: Cardiology

## 2011-12-21 ENCOUNTER — Ambulatory Visit: Payer: Medicare Other | Admitting: Cardiology

## 2011-12-22 ENCOUNTER — Other Ambulatory Visit: Payer: Self-pay | Admitting: Pulmonary Disease

## 2011-12-28 ENCOUNTER — Other Ambulatory Visit: Payer: Self-pay | Admitting: Pulmonary Disease

## 2011-12-28 ENCOUNTER — Other Ambulatory Visit: Payer: Self-pay | Admitting: Cardiology

## 2011-12-28 NOTE — Telephone Encounter (Signed)
Please advise if ok to refill, thanks 

## 2011-12-29 NOTE — Telephone Encounter (Signed)
..   Requested Prescriptions   Pending Prescriptions Disp Refills  . cloNIDine (CATAPRES) 0.2 MG tablet [Pharmacy Med Name: CLONIDINE 0.2MG  TABLETS] 30 tablet 11    Sig: TAKE 1/2 TABLET BY MOUTH TWICE DAILY

## 2012-01-15 ENCOUNTER — Ambulatory Visit (INDEPENDENT_AMBULATORY_CARE_PROVIDER_SITE_OTHER): Payer: Medicare Other | Admitting: *Deleted

## 2012-01-15 DIAGNOSIS — Z7901 Long term (current) use of anticoagulants: Secondary | ICD-10-CM

## 2012-01-15 DIAGNOSIS — I6350 Cerebral infarction due to unspecified occlusion or stenosis of unspecified cerebral artery: Secondary | ICD-10-CM

## 2012-01-15 DIAGNOSIS — I4891 Unspecified atrial fibrillation: Secondary | ICD-10-CM

## 2012-01-16 ENCOUNTER — Other Ambulatory Visit (INDEPENDENT_AMBULATORY_CARE_PROVIDER_SITE_OTHER): Payer: Medicare Other

## 2012-01-16 ENCOUNTER — Ambulatory Visit (INDEPENDENT_AMBULATORY_CARE_PROVIDER_SITE_OTHER)
Admission: RE | Admit: 2012-01-16 | Discharge: 2012-01-16 | Disposition: A | Discharge status: Home / Self Care | Payer: Medicare Other | Source: Ambulatory Visit | Attending: Pulmonary Disease | Admitting: Pulmonary Disease

## 2012-01-16 ENCOUNTER — Encounter: Payer: Self-pay | Admitting: Pulmonary Disease

## 2012-01-16 ENCOUNTER — Ambulatory Visit (INDEPENDENT_AMBULATORY_CARE_PROVIDER_SITE_OTHER): Payer: Medicare Other | Admitting: Pulmonary Disease

## 2012-01-16 VITALS — BP 120/72 | HR 76 | Temp 97.1°F | Ht 71.0 in | Wt 205.4 lb

## 2012-01-16 DIAGNOSIS — Z125 Encounter for screening for malignant neoplasm of prostate: Secondary | ICD-10-CM

## 2012-01-16 DIAGNOSIS — Z87898 Personal history of other specified conditions: Secondary | ICD-10-CM

## 2012-01-16 DIAGNOSIS — E7800 Pure hypercholesterolemia, unspecified: Secondary | ICD-10-CM

## 2012-01-16 DIAGNOSIS — I2789 Other specified pulmonary heart diseases: Secondary | ICD-10-CM

## 2012-01-16 DIAGNOSIS — I4891 Unspecified atrial fibrillation: Secondary | ICD-10-CM

## 2012-01-16 DIAGNOSIS — I509 Heart failure, unspecified: Secondary | ICD-10-CM

## 2012-01-16 DIAGNOSIS — M159 Polyosteoarthritis, unspecified: Secondary | ICD-10-CM

## 2012-01-16 DIAGNOSIS — M109 Gout, unspecified: Secondary | ICD-10-CM

## 2012-01-16 DIAGNOSIS — I10 Essential (primary) hypertension: Secondary | ICD-10-CM

## 2012-01-16 DIAGNOSIS — I281 Aneurysm of pulmonary artery: Secondary | ICD-10-CM

## 2012-01-16 DIAGNOSIS — R0609 Other forms of dyspnea: Secondary | ICD-10-CM

## 2012-01-16 DIAGNOSIS — I359 Nonrheumatic aortic valve disorder, unspecified: Secondary | ICD-10-CM

## 2012-01-16 DIAGNOSIS — I6350 Cerebral infarction due to unspecified occlusion or stenosis of unspecified cerebral artery: Secondary | ICD-10-CM

## 2012-01-16 DIAGNOSIS — R0600 Dyspnea, unspecified: Secondary | ICD-10-CM

## 2012-01-16 DIAGNOSIS — N259 Disorder resulting from impaired renal tubular function, unspecified: Secondary | ICD-10-CM

## 2012-01-16 LAB — CBC WITH DIFFERENTIAL/PLATELET
Basophils Relative: 0.3 % (ref 0.0–3.0)
Eosinophils Relative: 2 % (ref 0.0–5.0)
Lymphocytes Relative: 18.7 % (ref 12.0–46.0)
Monocytes Relative: 7.8 % (ref 3.0–12.0)
Neutrophils Relative %: 71.2 % (ref 43.0–77.0)
RBC: 4.61 Mil/uL (ref 4.22–5.81)
WBC: 8.7 10*3/uL (ref 4.5–10.5)

## 2012-01-16 LAB — BRAIN NATRIURETIC PEPTIDE: Pro B Natriuretic peptide (BNP): 381 pg/mL — ABNORMAL HIGH (ref 0.0–100.0)

## 2012-01-16 NOTE — Patient Instructions (Addendum)
Today we updated your med list in our EPIC system...    Continue your current medications the same...  Today we did your follow up CXR & blood work...    We will call you w/ the results when avail...  Stay as active as possible...  Call for any problems...  Let's plan a follow up visit in 6months.Marland KitchenMarland Kitchen

## 2012-01-16 NOTE — Progress Notes (Signed)
Subjective:    Patient ID: [name] [name], male    DOB: 01-09-1926, 78 y.o.   MRN: [id number]  HPI 78 y/o BM here for a follow up visit... he has multiple medical problems including HBP, CAD, AS, AFib, & CHF followed by DrHochrein & the CHFclinic;  known PulmHTN & PA aneurysm conservatively managed by DrGearhardt;  Hyperchol;  RI & BPH;  DJD & Gout...  ~  Jun11:  last visit we added Aldactone 25mg /d hoping to spare him some of the six KCl tabs he takes daily due to his Demadex requirement... this helped a little & he is down to 4 KCl per day but he developed gynecomastia & [name] switched him to Eplerenone 25mg ... he has had some diminution of the gynecomastia & tenderness- and still on 4 Demadex & 4 KCl (overall it hardly seems worth the Inspra cost to save 2KCl tabs per day- but he wants to continue & ask DrHochrein at his next follow up)... BP controlled on his now 6-7 meds! & due for f/u in the CHF clinic... he remains in AFib on coumadin... Chol controlled w/ Simva40... renal funct & PSA stable on labs within the last 57mo...  ~  Dec11:  he is "hanging in there" w/ his mult problems at age 50... no ch in med regimen & daugh does his meds for him...  denies CP, palpit, SOB, edema, etc... he saw DrHochrein 6/11> chr DOE, no change, same meds... he states that DrGearhardt has "released me" & he's due for a f/u CXR (no change)... Lipids OK on Simva40;  renal function sl worse w/ diuretics but BNP=535;  otherw stable.  ~  January 16, 2011:  57mo ROV & he reports visual problems> has cat surg & glaucoma on drops now; also c/o rib pain (right lower ribs)/ CWP & we discussed Rx w/ rest/ heat/ rib binder/ & Tramadol...    He was seen by DrHochrein 12/11 for f/u CHF clinic & he decr his Demadex from [facility] to 3 per day due to incr BUN 32 & Creat 1.8; AFib on Coumadin; HBP controlled on meds, & AS> he did f/u 2DEcho showing mild LVH, norm sys function w/ EF=55-60% & no regional wall motion abn, mod AS,  dilated LA&RA, modTR, PAsys=50...    Due for Fasting blood work> FLP looks good on Lip20, Chems stable on the 3 Demadex /d, continue same meds...  ~  July 19, 2011:  57mo ROV & his CC is poor energy and some DOE but he is too sedentary for too long & we discussed gradual exercise program to incr his stamina;  He saw DrHochrein for Cards f/u 9/12> doing well, rare episode palpit, occas SOB using O2 prn; he reduced his Verapamil dose to 120mg /d, continue Coumadin, & other meds... Repeat 2DEcho 10/12 showed mod LVH, norm sys function w/ EF=55-60% & norm wall motion, Gr2DD, severely calcif AoV leaflets w/ mod AS & 25mm mean gradient (peak=39), mild MR, severe LAdil & RAdil, RV sys function normal, modTR, PAsys=43...  LABS today ok w/ BUN=21, Creat=1.6, K=3.9, BNP=371...  ~  January 16, 2012:  57mo ROV & he remains stable, no new complaints or concerns, just wants Podiatrist to cut his toenails...    He had f/u DrHochrein> Mod AS, chr AFib, DD, etc; stable, no progressive symptoms & pt preferred conserv Rx; they discussed poss TAVR in future...    We reviewed prob list, meds, xrays and labs> see below>> CXR 6/13 showed  Mod cardiomeg  unchanged, ectasia & calcif in Ao, enlarged right pulm art (no change), elev left hemidiaph w/ bibasilar atx, NAD... LABS 6/13:  Chems- ok w/ BUN=30 Creat=1.5;  CBC- wnl;  TSH=1.56;  PSA=1.58;  BNP=381          Problem List:  OPHTHALMOLOGY - s/p cataract surg & followed for mild Glaucoma on gtts daily...  PULMONARY HYPERTENSION (ICD-416.8) - on home oxygen 2L/min "Prn"... ~  Prev 2DEcho w/ est PAsys= 64... he has not had a right heart cath... ~  Repeat 2DEcho 10/12 showed mod LVH, norm sys function w/ EF=55-60% & norm wall motion, Gr2DD, severely calcif AoV leaflets w/ mod AS & 25mm mean gradient (peak=39), mild MR, severe LAdil & RAdil, RV sys function normal, modTR, PAsys=43... ~  6/13:  O2 sat today is 95% on RA at rest;  CXR 6/13 showed  Mod cardiomeg unchanged, ectasia  & calcif in Ao, enlarged right pulm art (no change), elev left hemidiaph w/ bibasilar atx, NAD...  HYPERTENSION (ICD-401.9) - on METOPROLOL ER 100mg - 1/2 tab daily,  VERAPAMIL 240mg /d,  MICARDIS 80mg /d,  CLONIDINE 0.2mg - 1/2 Bid,  DEMADEX 20mg - 2am & 1pm,  EPLERENONE 25mg /d, & K20- 3/d... he was prev on Minoxidil  but this was  discontinued by Cardiology... ~  6/10: DrHochrein added back the Clonidine & asked him, again, to monitor BP at home. ~  12/10:  they requested trial to decr K20's from 6/d required due to his Demedex... suggest adding ALDACTONE 25mg /d & decr K20 to 4/d w/ f/u labs... ~  6/11:  Aldactone caused gynecomastia & was switched to EPLERENONE 25mg /d by [name] w/ some improvment in side effect. ~  6/12:  BP controlled & labs sl improved w/ the Demadex decr to 3/d... ~  12/12:  BP= 126/72 today and not checking at home, tolerating meds well; denies HA, visual changes, CP, palipit, dizziness, syncope; he has chr DOE without change and mild edema... ~  6/13:  BP= 120/72 & he remains stable w/o CP, palpit, +SOB w/o change, tr edema...  CHF (ICD-428.0) - see meds above... followed in the CHF Clinic by DrHochrein;  he has severe Diastolic CHF, w/ normal C's by cath in 2005, & normal LV systolic function w/ EF=75%... notes DOE- no change. ~  labs 6/09 showed BNP= 761 ~  labs 9/09 showed BNP= 486 ~  labs 12/09 showed BNP = 568 ~  labs 6/10 showed BNP= 421 ~  labs 12/10 showed BNP= 577... adding SPIRONOLACTONE 25mg /d. ~  labs 1/11 showed BNP= 395... later ch to EPLERENONE 25mg /d. ~  labs 12/11 showed BNP= 535 ~  Labs 6/12 showed BNP= 524 (on Demedex3/d + Eplerenone25mg /d). ~  Labs 12/12 showed BNP= 371 ~  4/13:  He had f/u DrHochrein> Mod AS, chr AFib, DD, etc; stable, no progressive symptoms & pt preferred conserv Rx; they discussed poss TAVR in future... ~  Labs 6/13 showed BNP= 381 (note: BUN=30 Creat=1.5)  AORTIC STENOSIS (ICD-424.1) - moderate by 2DEcho & followed by  DrHochrein... ~  repeat 2DEcho 1/09 hosp = similiar mod AS... ~  Repeat 2DEcho 1/12 showed mild LVH, norm sys function w/ EF=55-60% & no regional wall motion abn, mod AS, dilated LA&RA, modTR, PAsys=50... ~  Repeat 2DEcho 10/12 showed mod LVH, norm sys function w/ EF=55-60% & norm wall motion, Gr2DD, severely calcif AoV leaflets w/ mod AS & 25mm mean gradient (peak=39), mild MR, severe LAdil & RAdil, RV sys function normal, modTR, PAsys=43...  ATRIAL FIBRILLATION (ICD-427.31) - on COUMADIN &  followed in the Coumadin Clinic. ~  EKG 4/13 showed AFib, rate82, LAD, NSSTTWA...  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 ~  CXR 1/09 showed unchanged right PA aneurysm, stable cardiomeg, elevated left hemidiaph w/ atx. ~  CXR 6/10 showed no change, Ao tortuous and calcif, NAD. ~  CXR 12/11 showed stable elv left hemidiaph, bibasilar atx/ scarring, cardiomeg, ectatic ao, right PA enlargement. ~  CXR 6/13 showed mod cardiomeg unchanged, ectasia & calcif in Ao, enlarged right pulm art (no change), elev left hemidiaph w/ bibasilar atx, NAD...  HYPERCHOLESTEROLEMIA (ICD-272.0) - now on LIPITOR 20mg /d (prev Simva40 changed due to concomitant Verap). ~  FLP 1/09 on Simva40 showed TChol 95, TG 39, HDL 36, LDL 51 ~  FLP 12/09 on Simva40 showed TChol 167, TG 68, HDL 64, LDL 90 ~  FLP 1/11 on Simva40 showed TChol 153, TG 101, HDL 50, LDL 83... later ch to Lip20. ~  FLP 6/12 on Lip20 showed TChol 162, TG 95, HDL 46, LDL 97 ~  6/13: not fasting for FLP today...  HEMORRHOIDS (ICD-455.6)  RENAL INSUFFICIENCY (ICD-588.9)  ~  Creat=1.4 - 1.6 during 1/09 hosp... ~  labs 6/09 & 9/09 showed Creat= 1.3 ~  labs 12/09 showed BUN= 20, Creat= 1.3, K= 4.0 ~  labs 6/10 showed BUN= 24, Creat= 1.3, K= 3.8 ~  labs 12/10 showed BUN= 12, Creat= 1.2, K= 3.6 (on 6 K20/d) ~  labs 4/11 showed BUN= 18, Creat= 1.4, K= 4.0 (on 4 KCl/d) ~  labs 12/11 showed BUN= 32,  Creat= 1.8, K= 4.3 ~  Labs 6/12 showed BUN= 27, creat= 1.6, K= 4.1 ~  Labs 12/12 showed BUN=21, Creat=1.6, K=3.9 ~  Labs 6/13 showed BUN= 30, Creat= 1.5, K= 4.5  BENIGN PROSTATIC HYPERTROPHY, HX OF (ICD-V13.8) - followed by DrTannenbaum & taking FLOMAX 0.4mg /d... last seen 5/09 and his note is reviewed... ~  labs 12/09 showed PSA= 1.42 ~  labs 2/11 showed PSA= 1.65 ~  Labs 6/13 showed PSA= 1.58  CVA (ICD-434.91) - Stroke in 1990 in the setting of HBP...  DEGENERATIVE JOINT DISEASE, GENERALIZED (ICD-715.00) - he uses OTC pain meds & TRAMADOL 50mg  Prn...  GOUT (ICD-274.9) - on ALLOPURINOL 100mg /d,  & now off the Colchicine... ~  labs 10/08 showed Uric= 10.3 ~  labs 12/09 showed Uric= 7.7.Marland Kitchen. he wants to continue same meds. ~  labs 12/11 showed Uric = 6.4  INGUINAL HERNIA (ICD-550.90)  Health Maintenance - he receives the yearly seasonal Flu vaccines... f/u PNEUMOVAX at his request 2010...   Past Surgical History  Procedure Date  . Appendectomy   . Inguinal hernia repair   . Cataract extraction   . Cosmetic eye surgery     Outpatient Encounter Prescriptions as of 01/16/2012  Medication Sig Dispense Refill  . allopurinol (ZYLOPRIM) 100 MG tablet TAKE 1 TABLET BY MOUTH EVERY DAY  30 tablet  3  . atorvastatin (LIPITOR) 20 MG tablet TAKE 1 TABLET BY MOUTH DAILY  30 tablet  11  . bimatoprost (LUMIGAN) 0.03 % ophthalmic solution Place 1 drop into both eyes at bedtime.        . brinzolamide (AZOPT) 1 % ophthalmic suspension Place 1 drop into both eyes 2 (two) times daily.        . Cholecalciferol (VITAMIN D) 2000 UNITS CAPS Take 1 capsule by mouth daily.        . cloNIDine (CATAPRES) 0.2 MG tablet TAKE 1/2 TABLET BY MOUTH  TWICE DAILY  30 tablet  5  . eplerenone (INSPRA) 25 MG tablet TAKE 1 TABLET BY MOUTH EVERY DAY  30 tablet  5  . meclizine (ANTIVERT) 25 MG tablet Take 25 mg by mouth 3 (three) times daily as needed.        . metoprolol succinate (TOPROL-XL) 100 MG 24 hr tablet       .  MICARDIS 80 MG tablet TAKE 1 TABLET BY MOUTH EVERY DAY  30 tablet  6  . Multiple Vitamins-Minerals (ICAPS) TABS Take 1 tablet by mouth 2 (two) times daily.        . pilocarpine (PILOCAR) 4 % ophthalmic solution 1 drop into left eye every 4 hrs as needed      . potassium chloride SA (K-DUR,KLOR-CON) 20 MEQ tablet Take 3 tablets (60 mEq total) by mouth daily.  270 tablet  7  . Tamsulosin HCl (FLOMAX) 0.4 MG CAPS Take 0.4 mg by mouth daily.        Marland Kitchen torsemide (DEMADEX) 20 MG tablet TAKE 2 TABLETS BY MOUTH AM AND 1 BY MOUTH EVERY EVENING  90 tablet  0  . traMADol (ULTRAM) 50 MG tablet Take 2 tablets (100 mg total) by mouth every 6 (six) hours as needed for pain.  100 tablet  6  . verapamil (CALAN-SR) 120 MG CR tablet Take 1 tablet (120 mg total) by mouth at bedtime.  30 tablet  11  . warfarin (COUMADIN) 5 MG tablet TAKE AS DIRECTED  90 tablet  1  . DISCONTD: metoprolol succinate (TOPROL-XL) 100 MG 24 hr tablet TAKE 1 TABLET BY MOUTH EVERY DAY AS DIRECTED  30 tablet  8  . DISCONTD: cloNIDine (CATAPRES) 0.2 MG tablet TAKE 1/2 TABLET BY MOUTH TWICE DAILY  30 tablet  11  . DISCONTD: pilocarpine (PILOCAR) 1 % ophthalmic solution Place 1 drop into the left eye. Every 4 hrs daily         Allergies  Allergen Reactions  . Ibuprofen     REACTION: causes low blood pressure    Current Medications, Allergies, Past Medical History, Past Surgical History, Family History, and Social History were reviewed in [employer] record.    Review of Systems        See HPI - all other systems neg except as noted... The patient complains of decreased hearing, dyspnea on exertion, peripheral edema, and difficulty walking.  The patient denies anorexia, fever, weight loss, weight gain, vision loss, hoarseness, chest pain, syncope, prolonged cough, headaches, hemoptysis, abdominal pain, melena, hematochezia, severe indigestion/heartburn, hematuria, incontinence, muscle weakness, suspicious skin lesions,  transient blindness, depression, unusual weight change, abnormal bleeding, enlarged lymph nodes, and angioedema.  Objective:   Physical Exam     WD, WN, 78 y/o BM in NAD... GENERAL:  Alert & oriented; pleasant & cooperative... HEENT:  [place]/AT, EOM-wnl, PERRLA, EACs-clear, TMs-wnl, NOSE-clear, THROAT-clear & wnl. NECK:  Supple w/ fairROM; no JVD; sl decr carotid impulses w/ transmit murmur; no thyromegaly or nodules palpated; no lymphadenopathy. CHEST:  Clear to P & A; without wheezes/ rales/ or rhonchi heard... HEART:  sl irreg, gr 2/6 AS murmur at base, without rubs or gallops apprec... ABDOMEN:  Soft & nontender; normal bowel sounds; no organomegaly or masses detected. EXT: without deformities, mild arthritic changes; no varicose veins/ +venous insuffic/ tr edema. NEURO:  CN's intact; no focal neuro deficits... DERM:  No lesions noted; no rash etc...  RADIOLOGY DATA:  Reviewed in the EPIC EMR & discussed w/ the patient.Marland KitchenMarland Kitchen  LABORATORY DATA:  Reviewed in the EPIC EMR & discussed w/ the patient...   Assessment & Plan:   HBP>  Controlled on meds, continue same...  CHF>  Stable on current meds; renal w/ Creat=1.5...  AS>  Mod AS on 2DEcho & followed by DrHochrein...  AFib>  He remains on Coumadin followed in the CC...  PA art aneurysm & Pulm HTN>  Stable on current med regimen & not interested in additional eval or Rx...  CHOL>  FLP looks good on Lip20...  Renal Insuffic>  Creat improved to 1.5-1.6 & stable on current meds...  DJD/ Gout>  Stable as well, c/o some rib pain as noted; we discussed rest/ heat/ rib binder & Tramadol...  Other medical problems as noted...   Patient's Medications  New Prescriptions   No medications on file  Previous Medications   ALLOPURINOL (ZYLOPRIM) 100 MG TABLET    TAKE 1 TABLET BY MOUTH EVERY DAY   ATORVASTATIN (LIPITOR) 20 MG TABLET    TAKE 1 TABLET BY MOUTH DAILY   BIMATOPROST (LUMIGAN) 0.03 % OPHTHALMIC SOLUTION    Place 1 drop into both  eyes at bedtime.     BRINZOLAMIDE (AZOPT) 1 % OPHTHALMIC SUSPENSION    Place 1 drop into both eyes 2 (two) times daily.     CHOLECALCIFEROL (VITAMIN D) 2000 UNITS CAPS    Take 1 capsule by mouth daily.     CLONIDINE (CATAPRES) 0.2 MG TABLET    TAKE 1/2 TABLET BY MOUTH TWICE DAILY   EPLERENONE (INSPRA) 25 MG TABLET    TAKE 1 TABLET BY MOUTH EVERY DAY   MECLIZINE (ANTIVERT) 25 MG TABLET    Take 25 mg by mouth 3 (three) times daily as needed.     MICARDIS 80 MG TABLET    TAKE 1 TABLET BY MOUTH EVERY DAY   MULTIPLE VITAMINS-MINERALS (ICAPS) TABS    Take 1 tablet by mouth 2 (two) times daily.     PILOCARPINE (PILOCAR) 4 % OPHTHALMIC SOLUTION    1 drop into left eye every 4 hrs as needed   POTASSIUM CHLORIDE SA (K-DUR,KLOR-CON) 20 MEQ TABLET    Take 3 tablets (60 mEq total) by mouth daily.   TAMSULOSIN HCL (FLOMAX) 0.4 MG CAPS    Take 0.4 mg by mouth daily.     TORSEMIDE (DEMADEX) 20 MG TABLET    TAKE 2 TABLETS BY MOUTH AM AND 1 BY MOUTH EVERY EVENING   VERAPAMIL (CALAN-SR) 120 MG CR TABLET    Take 1 tablet (120 mg total) by mouth at bedtime.   WARFARIN (COUMADIN) 5 MG TABLET    TAKE AS DIRECTED  Modified Medications   Modified Medication Previous Medication   METOPROLOL SUCCINATE (TOPROL-XL) 100 MG 24 HR TABLET metoprolol succinate (TOPROL-XL) 100 MG 24 hr tablet          TAKE 1 TABLET BY MOUTH EVERY DAY AS DIRECTED  Discontinued Medications   CLONIDINE (CATAPRES) 0.2 MG TABLET    TAKE 1/2 TABLET BY MOUTH TWICE DAILY   PILOCARPINE (PILOCAR) 1 % OPHTHALMIC SOLUTION    Place 1 drop into the left eye. Every 4 hrs daily

## 2012-01-17 LAB — BASIC METABOLIC PANEL
BUN: 30 mg/dL — ABNORMAL HIGH (ref 6–23)
CO2: 25 mEq/L (ref 19–32)
Chloride: 103 mEq/L (ref 96–112)
Creatinine, Ser: 1.5 mg/dL (ref 0.4–1.5)

## 2012-01-17 LAB — HEPATIC FUNCTION PANEL
ALT: 9 U/L (ref 0–53)
Albumin: 4.2 g/dL (ref 3.5–5.2)
Total Bilirubin: 0.5 mg/dL (ref 0.3–1.2)
Total Protein: 7.1 g/dL (ref 6.0–8.3)

## 2012-01-17 LAB — PSA: PSA: 1.58 ng/mL (ref 0.10–4.00)

## 2012-01-18 NOTE — Progress Notes (Signed)
Quick Note:  Pt aware of results. ______ 

## 2012-01-19 ENCOUNTER — Other Ambulatory Visit: Payer: Self-pay | Admitting: Pulmonary Disease

## 2012-01-22 ENCOUNTER — Telehealth: Payer: Self-pay | Admitting: Pulmonary Disease

## 2012-01-22 MED ORDER — MECLIZINE HCL 25 MG PO TABS: 25.0000 mg | ORAL_TABLET | Freq: Three times a day (TID) | ORAL | Status: DC | PRN

## 2012-01-22 NOTE — Telephone Encounter (Signed)
Called and spoke with pts daughter and she is aware of meclizine that has been sent to the pts pharmacy.  Nothing further was needed.

## 2012-02-01 ENCOUNTER — Other Ambulatory Visit: Payer: Self-pay | Admitting: Pulmonary Disease

## 2012-02-27 ENCOUNTER — Ambulatory Visit (INDEPENDENT_AMBULATORY_CARE_PROVIDER_SITE_OTHER): Payer: Medicare Other | Admitting: *Deleted

## 2012-02-27 DIAGNOSIS — I6350 Cerebral infarction due to unspecified occlusion or stenosis of unspecified cerebral artery: Secondary | ICD-10-CM

## 2012-02-27 DIAGNOSIS — Z7901 Long term (current) use of anticoagulants: Secondary | ICD-10-CM

## 2012-02-27 DIAGNOSIS — I4891 Unspecified atrial fibrillation: Secondary | ICD-10-CM

## 2012-04-02 ENCOUNTER — Other Ambulatory Visit: Payer: Self-pay | Admitting: Pulmonary Disease

## 2012-04-09 ENCOUNTER — Ambulatory Visit (INDEPENDENT_AMBULATORY_CARE_PROVIDER_SITE_OTHER): Payer: Medicare Other | Admitting: *Deleted

## 2012-04-09 DIAGNOSIS — I6350 Cerebral infarction due to unspecified occlusion or stenosis of unspecified cerebral artery: Secondary | ICD-10-CM

## 2012-04-09 DIAGNOSIS — Z7901 Long term (current) use of anticoagulants: Secondary | ICD-10-CM

## 2012-04-09 DIAGNOSIS — I4891 Unspecified atrial fibrillation: Secondary | ICD-10-CM

## 2012-04-09 LAB — POCT INR: INR: 2.7

## 2012-04-13 ENCOUNTER — Other Ambulatory Visit: Payer: Self-pay | Admitting: Cardiology

## 2012-04-15 ENCOUNTER — Other Ambulatory Visit: Payer: Self-pay | Admitting: *Deleted

## 2012-04-27 ENCOUNTER — Other Ambulatory Visit: Payer: Self-pay | Admitting: Cardiology

## 2012-04-29 NOTE — Telephone Encounter (Signed)
..   Requested Prescriptions   Pending Prescriptions Disp Refills  . verapamil (CALAN-SR) 120 MG CR tablet [Pharmacy Med Name: VERAPAMIL ER 120MG  TABLETS] 30 tablet 3    Sig: TAKE 1 TABLET BY MOUTH EVERY NIGHT AT BEDTIME  .Marland KitchenPatient needs to contact office to schedule  Appointment  for Oct.Ph:[phone]. Thank you.

## 2012-05-06 ENCOUNTER — Other Ambulatory Visit: Payer: Self-pay | Admitting: Pulmonary Disease

## 2012-05-08 ENCOUNTER — Ambulatory Visit (INDEPENDENT_AMBULATORY_CARE_PROVIDER_SITE_OTHER): Payer: Medicare Other

## 2012-05-08 DIAGNOSIS — Z23 Encounter for immunization: Secondary | ICD-10-CM

## 2012-05-21 ENCOUNTER — Ambulatory Visit (INDEPENDENT_AMBULATORY_CARE_PROVIDER_SITE_OTHER): Payer: Medicare Other | Admitting: *Deleted

## 2012-05-21 DIAGNOSIS — Z7901 Long term (current) use of anticoagulants: Secondary | ICD-10-CM

## 2012-05-21 DIAGNOSIS — I4891 Unspecified atrial fibrillation: Secondary | ICD-10-CM

## 2012-05-21 DIAGNOSIS — I6350 Cerebral infarction due to unspecified occlusion or stenosis of unspecified cerebral artery: Secondary | ICD-10-CM

## 2012-05-21 LAB — POCT INR: INR: 2.3

## 2012-06-17 ENCOUNTER — Other Ambulatory Visit: Payer: Self-pay | Admitting: *Deleted

## 2012-06-17 MED ORDER — WARFARIN SODIUM 5 MG PO TABS: ORAL_TABLET | ORAL | Status: DC

## 2012-06-18 ENCOUNTER — Other Ambulatory Visit: Payer: Self-pay

## 2012-06-18 MED ORDER — ATORVASTATIN CALCIUM 20 MG PO TABS: 20.0000 mg | ORAL_TABLET | Freq: Every day | ORAL | Status: DC

## 2012-06-18 NOTE — Telephone Encounter (Signed)
..   Requested Prescriptions   Pending Prescriptions Disp Refills  . atorvastatin (LIPITOR) 20 MG tablet 30 tablet 2    Sig: Take 1 tablet (20 mg total) by mouth daily.  .Patient needs to contact office to schedule  Appointment  for future refills.[phone]. Thank you.

## 2012-07-09 ENCOUNTER — Ambulatory Visit (INDEPENDENT_AMBULATORY_CARE_PROVIDER_SITE_OTHER): Payer: Medicare Other | Admitting: *Deleted

## 2012-07-09 DIAGNOSIS — Z7901 Long term (current) use of anticoagulants: Secondary | ICD-10-CM

## 2012-07-09 DIAGNOSIS — I4891 Unspecified atrial fibrillation: Secondary | ICD-10-CM

## 2012-07-09 DIAGNOSIS — I6350 Cerebral infarction due to unspecified occlusion or stenosis of unspecified cerebral artery: Secondary | ICD-10-CM

## 2012-07-09 LAB — POCT INR: INR: 2.3

## 2012-07-16 ENCOUNTER — Ambulatory Visit (INDEPENDENT_AMBULATORY_CARE_PROVIDER_SITE_OTHER): Payer: Medicare Other | Admitting: Pulmonary Disease

## 2012-07-16 ENCOUNTER — Encounter: Payer: Self-pay | Admitting: Pulmonary Disease

## 2012-07-16 VITALS — BP 144/92 | HR 93 | Temp 97.1°F | Ht 71.0 in | Wt 203.0 lb

## 2012-07-16 DIAGNOSIS — I281 Aneurysm of pulmonary artery: Secondary | ICD-10-CM

## 2012-07-16 DIAGNOSIS — I359 Nonrheumatic aortic valve disorder, unspecified: Secondary | ICD-10-CM

## 2012-07-16 DIAGNOSIS — M109 Gout, unspecified: Secondary | ICD-10-CM

## 2012-07-16 DIAGNOSIS — Z87898 Personal history of other specified conditions: Secondary | ICD-10-CM

## 2012-07-16 DIAGNOSIS — R0600 Dyspnea, unspecified: Secondary | ICD-10-CM

## 2012-07-16 DIAGNOSIS — N259 Disorder resulting from impaired renal tubular function, unspecified: Secondary | ICD-10-CM

## 2012-07-16 DIAGNOSIS — M159 Polyosteoarthritis, unspecified: Secondary | ICD-10-CM

## 2012-07-16 DIAGNOSIS — I509 Heart failure, unspecified: Secondary | ICD-10-CM

## 2012-07-16 DIAGNOSIS — I10 Essential (primary) hypertension: Secondary | ICD-10-CM

## 2012-07-16 DIAGNOSIS — E7800 Pure hypercholesterolemia, unspecified: Secondary | ICD-10-CM

## 2012-07-16 DIAGNOSIS — I4891 Unspecified atrial fibrillation: Secondary | ICD-10-CM

## 2012-07-16 DIAGNOSIS — I2789 Other specified pulmonary heart diseases: Secondary | ICD-10-CM

## 2012-07-16 MED ORDER — METOPROLOL SUCCINATE ER 100 MG PO TB24: 100.0000 mg | ORAL_TABLET | Freq: Every day | ORAL | Status: DC

## 2012-07-16 MED ORDER — CLONIDINE HCL 0.2 MG PO TABS: 0.2000 mg | ORAL_TABLET | Freq: Two times a day (BID) | ORAL | Status: DC

## 2012-07-16 NOTE — Patient Instructions (Addendum)
Today we updated your med list in our EPIC system...    We decided to increase the CLONIDINE to 0.2mg  twice daily...    And to increase the METOPROLOL 100mg  to one tab daily...  We will arrange for a f/u Carotid Doppler test and an appt w/ DrHochrein for cardiology...  Today we did your targeted labs & we will call you w/ the results when avail...  Let's continue our 81mo follow up visits.Marland KitchenMarland Kitchen

## 2012-07-16 NOTE — Progress Notes (Signed)
Subjective:    Patient ID: [name] [name], male    DOB: 01-09-1926, 78 y.o.   MRN: [id number]  HPI 78 y/o BM here for a follow up visit... he has multiple medical problems including HBP, CAD, AS, AFib, & CHF followed by DrHochrein & the CHFclinic;  known PulmHTN & PA aneurysm conservatively managed by DrGearhardt;  Hyperchol;  RI & BPH;  DJD & Gout...  ~  Jun11:  last visit we added Aldactone 25mg /d hoping to spare him some of the six KCl tabs he takes daily due to his Demadex requirement... this helped a little & he is down to 4 KCl per day but he developed gynecomastia & [name] switched him to Eplerenone 25mg ... he has had some diminution of the gynecomastia & tenderness- and still on 4 Demadex & 4 KCl (overall it hardly seems worth the Inspra cost to save 2KCl tabs per day- but he wants to continue & ask DrHochrein at his next follow up)... BP controlled on his now 6-7 meds! & due for f/u in the CHF clinic... he remains in AFib on coumadin... Chol controlled w/ Simva40... renal funct & PSA stable on labs within the last 57mo...  ~  Dec11:  he is "hanging in there" w/ his mult problems at age 50... no ch in med regimen & daugh does his meds for him...  denies CP, palpit, SOB, edema, etc... he saw DrHochrein 6/11> chr DOE, no change, same meds... he states that DrGearhardt has "released me" & he's due for a f/u CXR (no change)... Lipids OK on Simva40;  renal function sl worse w/ diuretics but BNP=535;  otherw stable.  ~  January 16, 2011:  57mo ROV & he reports visual problems> has cat surg & glaucoma on drops now; also c/o rib pain (right lower ribs)/ CWP & we discussed Rx w/ rest/ heat/ rib binder/ & Tramadol...    He was seen by DrHochrein 12/11 for f/u CHF clinic & he decr his Demadex from [facility] to 3 per day due to incr BUN 32 & Creat 1.8; AFib on Coumadin; HBP controlled on meds, & AS> he did f/u 2DEcho showing mild LVH, norm sys function w/ EF=55-60% & no regional wall motion abn, mod AS,  dilated LA&RA, modTR, PAsys=50...    Due for Fasting blood work> FLP looks good on Lip20, Chems stable on the 3 Demadex /d, continue same meds...  ~  July 19, 2011:  57mo ROV & his CC is poor energy and some DOE but he is too sedentary for too long & we discussed gradual exercise program to incr his stamina;  He saw DrHochrein for Cards f/u 9/12> doing well, rare episode palpit, occas SOB using O2 prn; he reduced his Verapamil dose to 120mg /d, continue Coumadin, & other meds... Repeat 2DEcho 10/12 showed mod LVH, norm sys function w/ EF=55-60% & norm wall motion, Gr2DD, severely calcif AoV leaflets w/ mod AS & 25mm mean gradient (peak=39), mild MR, severe LAdil & RAdil, RV sys function normal, modTR, PAsys=43...  LABS today ok w/ BUN=21, Creat=1.6, K=3.9, BNP=371...  ~  January 16, 2012:  57mo ROV & he remains stable, no new complaints or concerns, just wants Podiatrist to cut his toenails...    He had f/u DrHochrein> Mod AS, chr AFib, DD, etc; stable, no progressive symptoms & pt preferred conserv Rx; they discussed poss TAVR in future...    We reviewed prob list, meds, xrays and labs> see below>> CXR 6/13 showed  Mod cardiomeg  unchanged, ectasia & calcif in Ao, enlarged right pulm art (no change), elev left hemidiaph w/ bibasilar atx, NAD... LABS 6/13:  Chems- ok w/ BUN=30 Creat=1.5;  CBC- wnl;  TSH=1.56;  PSA=1.58;  BNP=381   ~  July 16, 2012:  39mo ROV & [name] reports worsening vision due to his Glaucoma- on eye drops & followed closely by Ophthalmology;  BP was recently elev at DrTannenbaum's office & we decided to increase his doses of Clonidine & Metoprolol meds...  We reviewed the following medical problems during today's office visit>>     HBP> on MetoprololER100-1/2 daily, CalanSR120, Micardis80, Clonidine0.2-1/2Bid, Demadex20-3/d, Epleronone25, K20-3/d; BP=144/92 & he has sl HA; we decided to incr his Metopto 100mg /d & the Clonidine0.2 to 1 tab Bid...    CHF> followed by DrHochrein for  Cards- w/ severe Diastolic CHF, normal C's by cath in 2005, & normal LV systolic function w/ EF=75%; notes DOE- no change.    AS> w/ severely calcif AoV leaflets & mod AS w/ 25mm mean gradient (peak=39); pt desired conservative Rx, DrHochrein's notes are reviewed...    AFib> on Coumadin via CC; stable AFib on Coumadin w/ rate control...    Pulm Art aneurysm & PulmHTN> O2sat=94% on RA at rest & he has home O2 but just uses it "prn";  3 cm PA aneurysm followed by DrGearhardt without change x yrs...    CHOL> on Lip20; last FLP 6/12 showed TChol 162, TG 95, HDL 46, LDL 97; he doesn't seem to remember to come fasting for f/u labs...    Renal Insuffic, BPH> on Flomax0.4; baseline Creat= 1.5-1.6 range; followed by DrTannenbaum & managed conservatively...    DJD, Gout> on Tramadol, Allopurinol100, VitD2000; stable w/o specific complaints at present... We reviewed prob list, meds, xrays and labs> see below for updates >> he had the 2013 Flu vaccine in Oct...  NOTE> pt did not go to the lab for his BMet & BNP as requested... ADDENDUM: DrHochrein ordered CDopplers 12/13> mild heterogeneous plaque on right & mixed irreg plaque on left, 0-39% bilat ICA stenoses & antegrade vertebral flow; f/u 86yr...          Problem List:  OPHTHALMOLOGY - s/p cataract surg & followed for worsening Glaucoma on gtts daily...  PULMONARY HYPERTENSION (ICD-416.8) - on home oxygen 2L/min "Prn"... ~  Prev 2DEcho w/ est PAsys= 64... he has not had a right heart cath... ~  Repeat 2DEcho 10/12 showed mod LVH, norm sys function w/ EF=55-60% & norm wall motion, Gr2DD, severely calcif AoV leaflets w/ mod AS & 25mm mean gradient (peak=39), mild MR, severe LAdil & RAdil, RV sys function normal, modTR, PAsys=43... ~  6/13:  O2 sat today is 95% on RA at rest;  CXR 6/13 showed mod cardiomeg unchanged, ectasia & calcif in Ao, enlarged right pulm art (no change), elev left hemidiaph w/ bibasilar atx, NAD...  HYPERTENSION (ICD-401.9) - on  METOPROLOL ER 100mg - 1/2 tab daily,  VERAPAMIL 240mg /d,  MICARDIS 80mg /d,  CLONIDINE 0.2mg - 1/2 Bid,  DEMADEX 20mg - 2am & 1pm,  EPLERENONE 25mg /d, & K20- 3/d... he was prev on Minoxidil  but this was  discontinued by Cardiology... ~  6/10: DrHochrein added back the Clonidine & asked him, again, to monitor BP at home. ~  12/10:  they requested trial to decr K20's from 6/d required due to his Demedex... suggest adding ALDACTONE 25mg /d & decr K20 to 4/d w/ f/u labs... ~  6/11:  Aldactone caused gynecomastia & was switched to EPLERENONE 25mg /d by [name] w/  some improvment in side effect. ~  6/12:  BP controlled & labs sl improved w/ the Demadex decr to 3/d... ~  12/12:  BP= 126/72 today and not checking at home, tolerating meds well; denies HA, visual changes, CP, palipit, dizziness, syncope; he has chr DOE without change and mild edema... ~  6/13:  BP= 120/72 & he remains stable w/o CP, palpit, +SOB w/o change, tr edema...  CHF (ICD-428.0) - see meds above... followed in the CHF Clinic by DrHochrein;  he has severe Diastolic CHF, w/ normal C's by cath in 2005, & normal LV systolic function w/ EF=75%... notes DOE- no change. ~  labs 6/09 showed BNP= 761 ~  labs 9/09 showed BNP= 486 ~  labs 12/09 showed BNP = 568 ~  labs 6/10 showed BNP= 421 ~  labs 12/10 showed BNP= 577... adding SPIRONOLACTONE 25mg /d. ~  labs 1/11 showed BNP= 395... later ch to EPLERENONE 25mg /d. ~  labs 12/11 showed BNP= 535 ~  Labs 6/12 showed BNP= 524 (on Demedex3/d + Eplerenone25mg /d). ~  Labs 12/12 showed BNP= 371 ~  4/13:  He had f/u DrHochrein> Mod AS, chr AFib, DD, etc; stable, no progressive symptoms & pt preferred conserv Rx; they discussed poss TAVR in future... ~  Labs 6/13 showed BNP= 381 (note: BUN=30 Creat=1.5)  AORTIC STENOSIS (ICD-424.1) - moderate by 2DEcho & followed by DrHochrein... ~  repeat 2DEcho 1/09 hosp = similiar mod AS... ~  Repeat 2DEcho 1/12 showed mild LVH, norm sys function w/ EF=55-60% & no  regional wall motion abn, mod AS, dilated LA&RA, modTR, PAsys=50... ~  Repeat 2DEcho 10/12 showed mod LVH, norm sys function w/ EF=55-60% & norm wall motion, Gr2DD, severely calcif AoV leaflets w/ mod AS & 25mm mean gradient (peak=39), mild MR, severe LAdil & RAdil, RV sys function normal, modTR, PAsys=43...  ATRIAL FIBRILLATION (ICD-427.31) - on COUMADIN & followed in the Coumadin Clinic. ~  EKG 4/13 showed AFib, rate82, LAD, NSSTTWA...  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 ~  CXR 1/09 showed unchanged right PA aneurysm, stable cardiomeg, elevated left hemidiaph w/ atx. ~  CXR 6/10 showed no change, Ao tortuous and calcif, NAD. ~  CXR 12/11 showed stable elv left hemidiaph, bibasilar atx/ scarring, cardiomeg, ectatic ao, right PA enlargement. ~  CXR 6/13 showed mod cardiomeg unchanged, ectasia & calcif in Ao, enlarged right pulm art (no change), elev left hemidiaph w/ bibasilar atx, NAD...  ASPVD >> on Coumadin for his AFib... ~  DrHochrein ordered CDopplers 12/13> mild heterogeneous plaque on right & mixed irreg plaque on left, 0-39% bilat ICA stenoses & antegrade vertebral flow; f/u 78yr...  HYPERCHOLESTEROLEMIA (ICD-272.0) - now on LIPITOR 20mg /d (prev Simva40 changed due to concomitant Verap). ~  FLP 1/09 on Simva40 showed TChol 95, TG 39, HDL 36, LDL 51 ~  FLP 12/09 on Simva40 showed TChol 167, TG 68, HDL 64, LDL 90 ~  FLP 1/11 on Simva40 showed TChol 153, TG 101, HDL 50, LDL 83... later ch to Lip20. ~  FLP 6/12 on Lip20 showed TChol 162, TG 95, HDL 46, LDL 97 ~  6/13: not fasting for FLP today...  HEMORRHOIDS (ICD-455.6)  RENAL INSUFFICIENCY (ICD-588.9)  ~  Creat=1.4 - 1.6 during 1/09 hosp... ~  labs 6/09 & 9/09 showed Creat= 1.3 ~  labs 12/09 showed BUN= 20, Creat= 1.3, K= 4.0 ~  labs 6/10 showed BUN= 24, Creat= 1.3, K= 3.8 ~  labs 12/10 showed  BUN= 12, Creat= 1.2, K= 3.6 (on 6 K20/d) ~  labs 4/11 showed BUN=  18, Creat= 1.4, K= 4.0 (on 4 KCl/d) ~  labs 12/11 showed BUN= 32, Creat= 1.8, K= 4.3 ~  Labs 6/12 showed BUN= 27, creat= 1.6, K= 4.1 ~  Labs 12/12 showed BUN=21, Creat=1.6, K=3.9 ~  Labs 6/13 showed BUN= 30, Creat= 1.5, K= 4.5  BENIGN PROSTATIC HYPERTROPHY, HX OF (ICD-V13.8) - followed by DrTannenbaum & taking FLOMAX 0.4mg /d... last seen 5/09 and his note is reviewed... ~  labs 12/09 showed PSA= 1.42 ~  labs 2/11 showed PSA= 1.65 ~  Labs 6/13 showed PSA= 1.58 ~  10/13: he had Urology f/u DrTannenbaum> BPH w/ BOO, nocturia, mild Uincont; he didn't want any intervention & no changes made to his regimen...  CVA (ICD-434.91) - Stroke in 1990 in the setting of HBP...  DEGENERATIVE JOINT DISEASE, GENERALIZED (ICD-715.00) - he uses OTC pain meds & TRAMADOL 50mg  Prn...  GOUT (ICD-274.9) - on ALLOPURINOL 100mg /d,  & now off the Colchicine... ~  labs 10/08 showed Uric= 10.3 ~  labs 12/09 showed Uric= 7.7.Marland Kitchen. he wants to continue same meds. ~  labs 12/11 showed Uric = 6.4  INGUINAL HERNIA (ICD-550.90)  Health Maintenance - he receives the yearly seasonal Flu vaccines... f/u PNEUMOVAX at his request 2010...   Past Surgical History  Procedure Date  . Appendectomy   . Inguinal hernia repair   . Cataract extraction   . Cosmetic eye surgery     Outpatient Encounter Prescriptions as of 07/16/2012  Medication Sig Dispense Refill  . allopurinol (ZYLOPRIM) 100 MG tablet TAKE 1 TABLET BY MOUTH EVERY DAY  30 tablet  3  . atorvastatin (LIPITOR) 20 MG tablet Take 1 tablet (20 mg total) by mouth daily.  30 tablet  2  . bimatoprost (LUMIGAN) 0.03 % ophthalmic solution Place 1 drop into both eyes at bedtime.        . brinzolamide (AZOPT) 1 % ophthalmic suspension Place 1 drop into both eyes 2 (two) times daily.        . Cholecalciferol (VITAMIN D) 2000 UNITS CAPS Take 1 capsule by mouth daily.        . cloNIDine (CATAPRES) 0.2 MG tablet TAKE 1/2 TABLET BY MOUTH TWICE DAILY  30 tablet  5  . eplerenone  (INSPRA) 25 MG tablet TAKE 1 TABLET BY MOUTH EVERY DAY  30 tablet  5  . meclizine (ANTIVERT) 25 MG tablet Take 1 tablet (25 mg total) by mouth 3 (three) times daily as needed.  50 tablet  5  . metoprolol succinate (TOPROL-XL) 100 MG 24 hr tablet Take 1/2 tablet by mouth daily      . MICARDIS 80 MG tablet TAKE 1 TABLET BY MOUTH EVERY DAY  30 tablet  3  . Multiple Vitamins-Minerals (ICAPS) TABS Take 1 tablet by mouth 2 (two) times daily.        . pilocarpine (PILOCAR) 4 % ophthalmic solution 1 drop into left eye every 4 hrs as needed      . potassium chloride SA (K-DUR,KLOR-CON) 20 MEQ tablet Take 3 tablets (60 mEq total) by mouth daily.  270 tablet  7  . Tamsulosin HCl (FLOMAX) 0.4 MG CAPS Take 0.4 mg by mouth daily.        Marland Kitchen torsemide (DEMADEX) 20 MG tablet TAKE 2 TABLETS BY MOUTH AM AND 1 BY MOUTH EVERY EVENING  90 tablet  5  . verapamil (CALAN-SR) 120 MG CR tablet TAKE 1 TABLET BY MOUTH  EVERY NIGHT AT BEDTIME  30 tablet  3  . warfarin (COUMADIN) 5 MG tablet Take as directed by coumadin clinic  90 tablet  1    Allergies  Allergen Reactions  . Ibuprofen     REACTION: causes low blood pressure    Current Medications, Allergies, Past Medical History, Past Surgical History, Family History, and Social History were reviewed in [employer] record.    Review of Systems        See HPI - all other systems neg except as noted... The patient complains of decreased hearing, dyspnea on exertion, peripheral edema, and difficulty walking.  The patient denies anorexia, fever, weight loss, weight gain, vision loss, hoarseness, chest pain, syncope, prolonged cough, headaches, hemoptysis, abdominal pain, melena, hematochezia, severe indigestion/heartburn, hematuria, incontinence, muscle weakness, suspicious skin lesions, transient blindness, depression, unusual weight change, abnormal bleeding, enlarged lymph nodes, and angioedema.  Objective:   Physical Exam     WD, WN, 78 y/o BM  in NAD... GENERAL:  Alert & oriented; pleasant & cooperative... HEENT:  [place]/AT, EOM-wnl, PERRLA, EACs-clear, TMs-wnl, NOSE-clear, THROAT-clear & wnl. NECK:  Supple w/ fairROM; no JVD; sl decr carotid impulses w/ transmit murmur; no thyromegaly or nodules palpated; no lymphadenopathy. CHEST:  Clear to P & A; without wheezes/ rales/ or rhonchi heard... HEART:  sl irreg, gr 2/6 AS murmur at base, without rubs or gallops apprec... ABDOMEN:  Soft & nontender; normal bowel sounds; no organomegaly or masses detected. EXT: without deformities, mild arthritic changes; no varicose veins/ +venous insuffic/ tr edema. NEURO:  CN's intact; no focal neuro deficits... DERM:  No lesions noted; no rash etc...  RADIOLOGY DATA:  Reviewed in the EPIC EMR & discussed w/ the patient...  LABORATORY DATA:  Reviewed in the EPIC EMR & discussed w/ the patient...   Assessment & Plan:    HBP>  Fair control on his extensive med regimen; we decided to incr the dose of Catapress & Metoprolol...  CHF>  Stable on current meds; renal w/ Creat=1.5...  AS>  Mod AS on 2DEcho & followed by DrHochrein...  AFib>  He remains on Coumadin followed in the CC...  PA art aneurysm & Pulm HTN>  Stable on current med regimen & not interested in additional eval or Rx...  CHOL>  FLP looks good on Lip20 when last checked 6/12...  Renal Insuffic>  Creat improved to 1.5-1.6 & stable on current meds...  DJD/ Gout>  Stable as well, c/o some rib pain as noted; we discussed rest/ heat/ rib binder & Tramadol...  Other medical problems as noted...   Patient's Medications  New Prescriptions   No medications on file  Previous Medications   ALLOPURINOL (ZYLOPRIM) 100 MG TABLET    TAKE 1 TABLET BY MOUTH EVERY DAY   ATORVASTATIN (LIPITOR) 20 MG TABLET    Take 1 tablet (20 mg total) by mouth daily.   BIMATOPROST (LUMIGAN) 0.03 % OPHTHALMIC SOLUTION    Place 1 drop into both eyes at bedtime.     BRINZOLAMIDE (AZOPT) 1 % OPHTHALMIC  SUSPENSION    Place 1 drop into both eyes 2 (two) times daily.     CHOLECALCIFEROL (VITAMIN D) 2000 UNITS CAPS    Take 1 capsule by mouth daily.     MECLIZINE (ANTIVERT) 25 MG TABLET    Take 1 tablet (25 mg total) by mouth 3 (three) times daily as needed.   MULTIPLE VITAMINS-MINERALS (ICAPS) TABS    Take 1 tablet by mouth 2 (two) times daily.  PILOCARPINE (PILOCAR) 4 % OPHTHALMIC SOLUTION    1 drop into left eye every 4 hrs as needed   POTASSIUM CHLORIDE SA (K-DUR,KLOR-CON) 20 MEQ TABLET    Take 3 tablets (60 mEq total) by mouth daily.   TAMSULOSIN HCL (FLOMAX) 0.4 MG CAPS    Take 0.4 mg by mouth daily.     VERAPAMIL (CALAN-SR) 120 MG CR TABLET    TAKE 1 TABLET BY MOUTH EVERY NIGHT AT BEDTIME   WARFARIN (COUMADIN) 5 MG TABLET    Take as directed by coumadin clinic  Modified Medications   Modified Medication Previous Medication   CLONIDINE (CATAPRES) 0.2 MG TABLET cloNIDine (CATAPRES) 0.2 MG tablet      Take 1 tablet (0.2 mg total) by mouth 2 (two) times daily.    TAKE 1/2 TABLET BY MOUTH TWICE DAILY   EPLERENONE (INSPRA) 25 MG TABLET eplerenone (INSPRA) 25 MG tablet      Take 1 tablet (25 mg total) by mouth daily.    TAKE 1 TABLET BY MOUTH EVERY DAY   EPLERENONE (INSPRA) 25 MG TABLET eplerenone (INSPRA) 25 MG tablet      TAKE 1 TABLET BY MOUTH EVERY DAY    Take 1 tablet (25 mg total) by mouth daily.   METOPROLOL SUCCINATE (TOPROL-XL) 100 MG 24 HR TABLET metoprolol succinate (TOPROL-XL) 100 MG 24 hr tablet      Take 100 mg by mouth daily.    Take 1/2 tablet by mouth daily   MICARDIS 80 MG TABLET MICARDIS 80 MG tablet      TAKE 1 TABLET BY MOUTH EVERY DAY    TAKE 1 TABLET BY MOUTH EVERY DAY   TORSEMIDE (DEMADEX) 20 MG TABLET torsemide (DEMADEX) 20 MG tablet      TAKE 2 TABLETS BY MOUTH AM AND 1 BY MOUTH EVERY EVENING    TAKE 2 TABLETS BY MOUTH AM AND 1 BY MOUTH EVERY EVENING   TORSEMIDE (DEMADEX) 20 MG TABLET torsemide (DEMADEX) 20 MG tablet      TAKE 2 TABLETS BY MOUTH AM AND 1 BY MOUTH  EVERY EVENING    Take 2 in am and 1 in pm  Discontinued Medications   No medications on file

## 2012-07-19 ENCOUNTER — Other Ambulatory Visit: Payer: Self-pay | Admitting: Pulmonary Disease

## 2012-07-19 MED ORDER — EPLERENONE 25 MG PO TABS: 25.0000 mg | ORAL_TABLET | Freq: Every day | ORAL | Status: DC

## 2012-07-19 NOTE — Telephone Encounter (Signed)
Refill has been sent to the pharmacy.  

## 2012-07-21 ENCOUNTER — Other Ambulatory Visit: Payer: Self-pay | Admitting: Pulmonary Disease

## 2012-07-24 ENCOUNTER — Ambulatory Visit (INDEPENDENT_AMBULATORY_CARE_PROVIDER_SITE_OTHER): Payer: Medicare Other

## 2012-07-24 DIAGNOSIS — R0989 Other specified symptoms and signs involving the circulatory and respiratory systems: Secondary | ICD-10-CM

## 2012-07-24 DIAGNOSIS — I6529 Occlusion and stenosis of unspecified carotid artery: Secondary | ICD-10-CM

## 2012-07-24 DIAGNOSIS — I10 Essential (primary) hypertension: Secondary | ICD-10-CM

## 2012-08-05 ENCOUNTER — Other Ambulatory Visit: Payer: Self-pay | Admitting: Pulmonary Disease

## 2012-08-19 ENCOUNTER — Other Ambulatory Visit: Payer: Self-pay | Admitting: *Deleted

## 2012-08-19 ENCOUNTER — Other Ambulatory Visit: Payer: Self-pay

## 2012-08-19 MED ORDER — VERAPAMIL HCL ER 120 MG PO TBCR: 120.0000 mg | EXTENDED_RELEASE_TABLET | Freq: Every day | ORAL | Status: DC

## 2012-08-19 MED ORDER — ATORVASTATIN CALCIUM 20 MG PO TABS: 20.0000 mg | ORAL_TABLET | Freq: Every day | ORAL | Status: DC

## 2012-08-20 ENCOUNTER — Other Ambulatory Visit (INDEPENDENT_AMBULATORY_CARE_PROVIDER_SITE_OTHER): Payer: Medicare Other

## 2012-08-20 ENCOUNTER — Ambulatory Visit: Payer: Medicare Other | Admitting: Cardiology

## 2012-08-20 DIAGNOSIS — R0600 Dyspnea, unspecified: Secondary | ICD-10-CM

## 2012-08-20 DIAGNOSIS — I10 Essential (primary) hypertension: Secondary | ICD-10-CM

## 2012-08-20 DIAGNOSIS — R0989 Other specified symptoms and signs involving the circulatory and respiratory systems: Secondary | ICD-10-CM

## 2012-08-20 LAB — BRAIN NATRIURETIC PEPTIDE: Pro B Natriuretic peptide (BNP): 427 pg/mL — ABNORMAL HIGH (ref 0.0–100.0)

## 2012-08-20 LAB — BASIC METABOLIC PANEL
Calcium: 9.1 mg/dL (ref 8.4–10.5)
GFR: 59.96 mL/min — ABNORMAL LOW (ref >60.00)
Glucose, Bld: 117 mg/dL — ABNORMAL HIGH (ref 70–99)
Sodium: 140 mEq/L (ref 135–145)

## 2012-08-22 ENCOUNTER — Encounter: Payer: Self-pay | Admitting: Cardiology

## 2012-08-22 ENCOUNTER — Ambulatory Visit (INDEPENDENT_AMBULATORY_CARE_PROVIDER_SITE_OTHER): Payer: Medicare Other | Admitting: Cardiology

## 2012-08-22 ENCOUNTER — Ambulatory Visit (INDEPENDENT_AMBULATORY_CARE_PROVIDER_SITE_OTHER): Payer: Medicare Other | Admitting: *Deleted

## 2012-08-22 VITALS — BP 157/94 | HR 69 | Ht 71.0 in | Wt 204.0 lb

## 2012-08-22 DIAGNOSIS — Z7901 Long term (current) use of anticoagulants: Secondary | ICD-10-CM

## 2012-08-22 DIAGNOSIS — I6350 Cerebral infarction due to unspecified occlusion or stenosis of unspecified cerebral artery: Secondary | ICD-10-CM

## 2012-08-22 DIAGNOSIS — I4891 Unspecified atrial fibrillation: Secondary | ICD-10-CM

## 2012-08-22 NOTE — Progress Notes (Signed)
HPI The patient presents as followup of his multiple cardiovascular problems. He actually has been doing relatively well.  Since I last saw him he has done well.  He denies any acute symptoms such as chest pressure, neck or arm discomfort. He rarely feels palpitations. He has had no presyncope or syncope. He denies any new chest pressure, neck or arm discomfort. He is very mild ankle edema.  Allergies  Allergen Reactions  . Ibuprofen     REACTION: causes low blood pressure    Current Outpatient Prescriptions  Medication Sig Dispense Refill  . allopurinol (ZYLOPRIM) 100 MG tablet TAKE 1 TABLET BY MOUTH EVERY DAY  30 tablet  3  . atorvastatin (LIPITOR) 20 MG tablet Take 1 tablet (20 mg total) by mouth daily.  30 tablet  0  . bimatoprost (LUMIGAN) 0.03 % ophthalmic solution Place 1 drop into both eyes at bedtime.        . brinzolamide (AZOPT) 1 % ophthalmic suspension Place 1 drop into both eyes 2 (two) times daily.        . Cholecalciferol (VITAMIN D) 2000 UNITS CAPS Take 1 capsule by mouth daily.        . cloNIDine (CATAPRES) 0.2 MG tablet Take 1 tablet (0.2 mg total) by mouth 2 (two) times daily.  30 tablet  11  . eplerenone (INSPRA) 25 MG tablet Take 1 tablet (25 mg total) by mouth daily.  30 tablet  5  . eplerenone (INSPRA) 25 MG tablet TAKE 1 TABLET BY MOUTH EVERY DAY  30 tablet  0  . meclizine (ANTIVERT) 25 MG tablet Take 1 tablet (25 mg total) by mouth 3 (three) times daily as needed.  50 tablet  5  . metoprolol succinate (TOPROL-XL) 100 MG 24 hr tablet Take 100 mg by mouth daily.      Marland Kitchen MICARDIS 80 MG tablet TAKE 1 TABLET BY MOUTH EVERY DAY  30 tablet  6  . Multiple Vitamins-Minerals (ICAPS) TABS Take 1 tablet by mouth 2 (two) times daily.        . pilocarpine (PILOCAR) 4 % ophthalmic solution 1 drop into left eye every 4 hrs as needed      . potassium chloride SA (K-DUR,KLOR-CON) 20 MEQ tablet Take 3 tablets (60 mEq total) by mouth daily.  270 tablet  7  . Tamsulosin HCl  (FLOMAX) 0.4 MG CAPS Take 0.4 mg by mouth daily.        Marland Kitchen torsemide (DEMADEX) 20 MG tablet TAKE 2 TABLETS BY MOUTH AM AND 1 BY MOUTH EVERY EVENING  90 tablet  6  . torsemide (DEMADEX) 20 MG tablet TAKE 2 TABLETS BY MOUTH AM AND 1 BY MOUTH EVERY EVENING  90 tablet  0  . verapamil (CALAN-SR) 120 MG CR tablet Take 1 tablet (120 mg total) by mouth at bedtime.  30 tablet  3  . warfarin (COUMADIN) 5 MG tablet Take as directed by coumadin clinic  90 tablet  1    Past Medical History  Diagnosis Date  . Other chronic pulmonary heart diseases   . Unspecified essential hypertension   . Congestive heart failure, unspecified   . Aortic valve disorders   . Atrial fibrillation   . Aneurysm of pulmonary artery   . Pure hypercholesterolemia   . Unspecified disorder resulting from impaired renal function   . Unspecified hemorrhoids without mention of complication   . Unspecified cerebral artery occlusion with cerebral infarction   . Generalized osteoarthrosis, unspecified site   .  Gout, unspecified   . Inguinal hernia without mention of obstruction or gangrene, unilateral or unspecified, (not specified as recurrent)   . Pulmonary hypertension   . Aortic stenosis   . Renal insufficiency   . BPH (benign prostatic hypertrophy)   . CVA (cerebral infarction)   . DJD (degenerative joint disease)     Past Surgical History  Procedure Date  . Appendectomy   . Inguinal hernia repair   . Cataract extraction   . Cosmetic eye surgery     ROS:  As stated in the HPI and negative for all other systems.  PHYSICAL EXAM BP 157/94  Pulse 69  Ht 5\' 11"  (1.803 m)  Wt 204 lb (92.534 kg)  BMI 28.45 kg/m2 GENERAL:  Well appearing HEENT:  Pupils equal round and reactive, fundi not visualized, oral mucosa unremarkable, upper denture NECK:  No jugular venous distention, waveform within normal limits, carotid upstroke brisk and symmetric, transmitted murmur vs bruit, no thyromegaly LUNGS:  Clear to auscultation  bilaterally BACK:  No CVA tenderness CHEST:  Unremarkable HEART:  PMI not displaced or sustained,S1 and S2 within normal limits, no S3, no clicks, no rubs, apical mid peaking systolic murmur, irregular ABD:  Flat, positive bowel sounds normal in frequency in pitch, no bruits, no rebound, no guarding, no midline pulsatile mass, no hepatomegaly, no splenomegaly EXT:  2 plus pulses throughout, trace bilateral edema, no cyanosis no clubbing   EKG:  Atrial fibrillation, left axis deviation, rate 69, lateral T wave inversions, no change from previous. 08/22/2012  ASSESSMENT AND PLAN  AORTIC STENOSIS -  The patient has declined followup echo in the past. He wants conservative management. He does have a slightly elevated BNP recently and we discussed salt and fluid restriction. No change in therapy is indicated.   ATRIAL FIBRILLATION -  The patient tolerates this rhythm and rate control and anticoagulation. He has no desire to switch to one of the newer agents.  HYPERTENSION -  His blood pressure is slightly elevated but he recently had his meds adjusted. I will defer to NADEL,SCOTT M, MD  PULMONARY HYPERTENSION -  He does have pulmonary hypertension and pulmonary artery aneurysm which is being managed conservatively.

## 2012-08-22 NOTE — Patient Instructions (Addendum)
The current medical regimen is effective;  continue present plan and medications.  Follow up in 1 year with Dr Hochrein.  You will receive a letter in the mail 2 months before you are due.  Please call us when you receive this letter to schedule your follow up appointment.  

## 2012-08-23 ENCOUNTER — Other Ambulatory Visit: Payer: Self-pay

## 2012-08-26 ENCOUNTER — Other Ambulatory Visit: Payer: Self-pay | Admitting: Pulmonary Disease

## 2012-09-10 ENCOUNTER — Telehealth: Payer: Self-pay | Admitting: Pulmonary Disease

## 2012-09-10 MED ORDER — ALLOPURINOL 100 MG PO TABS: 100.0000 mg | ORAL_TABLET | Freq: Every day | ORAL | Status: DC

## 2012-09-10 NOTE — Telephone Encounter (Signed)
Rx has been sent in, pt daughter is aware.  

## 2012-09-17 ENCOUNTER — Ambulatory Visit (INDEPENDENT_AMBULATORY_CARE_PROVIDER_SITE_OTHER): Payer: Medicare Other | Admitting: *Deleted

## 2012-09-17 DIAGNOSIS — I6350 Cerebral infarction due to unspecified occlusion or stenosis of unspecified cerebral artery: Secondary | ICD-10-CM

## 2012-09-17 DIAGNOSIS — I4891 Unspecified atrial fibrillation: Secondary | ICD-10-CM

## 2012-09-17 DIAGNOSIS — Z7901 Long term (current) use of anticoagulants: Secondary | ICD-10-CM

## 2012-09-17 LAB — POCT INR: INR: 2.8

## 2012-09-24 ENCOUNTER — Other Ambulatory Visit: Payer: Self-pay | Admitting: Pulmonary Disease

## 2012-10-09 ENCOUNTER — Other Ambulatory Visit: Payer: Self-pay | Admitting: Pulmonary Disease

## 2012-10-09 MED ORDER — TORSEMIDE 20 MG PO TABS: ORAL_TABLET | ORAL | Status: DC

## 2012-10-15 ENCOUNTER — Ambulatory Visit (INDEPENDENT_AMBULATORY_CARE_PROVIDER_SITE_OTHER): Payer: Medicare Other | Admitting: *Deleted

## 2012-10-15 DIAGNOSIS — Z7901 Long term (current) use of anticoagulants: Secondary | ICD-10-CM

## 2012-10-15 DIAGNOSIS — I6350 Cerebral infarction due to unspecified occlusion or stenosis of unspecified cerebral artery: Secondary | ICD-10-CM

## 2012-10-15 DIAGNOSIS — I4891 Unspecified atrial fibrillation: Secondary | ICD-10-CM

## 2012-10-15 LAB — POCT INR: INR: 4.3

## 2012-10-22 ENCOUNTER — Telehealth: Payer: Self-pay | Admitting: Pulmonary Disease

## 2012-10-22 NOTE — Telephone Encounter (Signed)
Rec'd from [facility] forward 3 pages to Dr.Nadel

## 2012-10-24 ENCOUNTER — Other Ambulatory Visit: Payer: Self-pay | Admitting: Pulmonary Disease

## 2012-10-28 ENCOUNTER — Telehealth: Payer: Self-pay | Admitting: Pulmonary Disease

## 2012-10-28 NOTE — Telephone Encounter (Signed)
Spoke w rep from [facility], inquiring if form was ever received requesting a tele monitoring device for patient. I have requested rep resend fax to triage fax, as I will also forward this to Marliss Czar so that she is aware. Thanks

## 2012-10-31 ENCOUNTER — Telehealth: Payer: Self-pay | Admitting: Pulmonary Disease

## 2012-10-31 NOTE — Telephone Encounter (Signed)
[employer] is calling again about this form. Leigh did you ever receive this form? Carron Curie, CMA

## 2012-10-31 NOTE — Telephone Encounter (Signed)
[employer] advised to fax to Dr. Antoine Poche. Carron Curie, CMA

## 2012-10-31 NOTE — Telephone Encounter (Signed)
This is a duplicate message. I will close this one and continue on 10-28-12 message. Carron Curie, CMA

## 2012-10-31 NOTE — Telephone Encounter (Signed)
This form will need to be faxed to cardiology ---pt is seen by Dr. Antoine Poche.  thanks

## 2012-11-01 ENCOUNTER — Other Ambulatory Visit: Payer: Self-pay

## 2012-11-01 MED ORDER — ATORVASTATIN CALCIUM 20 MG PO TABS: 20.0000 mg | ORAL_TABLET | Freq: Every day | ORAL | Status: DC

## 2012-11-01 NOTE — Telephone Encounter (Signed)
..   Requested Prescriptions   Pending Prescriptions Disp Refills  . atorvastatin (LIPITOR) 20 MG tablet 30 tablet 10    Sig: Take 1 tablet (20 mg total) by mouth daily.

## 2012-11-04 ENCOUNTER — Other Ambulatory Visit: Payer: Self-pay | Admitting: Cardiovascular Disease

## 2012-11-07 ENCOUNTER — Ambulatory Visit (INDEPENDENT_AMBULATORY_CARE_PROVIDER_SITE_OTHER): Payer: Medicare Other | Admitting: Pharmacist

## 2012-11-07 DIAGNOSIS — I4891 Unspecified atrial fibrillation: Secondary | ICD-10-CM

## 2012-11-07 DIAGNOSIS — Z7901 Long term (current) use of anticoagulants: Secondary | ICD-10-CM

## 2012-11-07 DIAGNOSIS — I6350 Cerebral infarction due to unspecified occlusion or stenosis of unspecified cerebral artery: Secondary | ICD-10-CM

## 2012-11-21 ENCOUNTER — Ambulatory Visit (INDEPENDENT_AMBULATORY_CARE_PROVIDER_SITE_OTHER): Payer: Medicare Other

## 2012-11-21 DIAGNOSIS — Z7901 Long term (current) use of anticoagulants: Secondary | ICD-10-CM

## 2012-11-21 DIAGNOSIS — I6350 Cerebral infarction due to unspecified occlusion or stenosis of unspecified cerebral artery: Secondary | ICD-10-CM

## 2012-11-21 DIAGNOSIS — I4891 Unspecified atrial fibrillation: Secondary | ICD-10-CM

## 2012-12-11 ENCOUNTER — Other Ambulatory Visit: Payer: Self-pay | Admitting: Pulmonary Disease

## 2012-12-16 ENCOUNTER — Other Ambulatory Visit: Payer: Self-pay

## 2012-12-16 MED ORDER — VERAPAMIL HCL ER 120 MG PO TBCR: 120.0000 mg | EXTENDED_RELEASE_TABLET | Freq: Every day | ORAL | Status: DC

## 2012-12-16 NOTE — Telephone Encounter (Signed)
..   Requested Prescriptions   Signed Prescriptions Disp Refills  . verapamil (CALAN-SR) 120 MG CR tablet 30 tablet 3    Sig: Take 1 tablet (120 mg total) by mouth at bedtime.    Authorizing Provider: Rollene Rotunda    Ordering User: Christella Hartigan, Melvin Marmo Judie Petit

## 2012-12-18 ENCOUNTER — Other Ambulatory Visit: Payer: Self-pay

## 2012-12-18 MED ORDER — POTASSIUM CHLORIDE CRYS ER 20 MEQ PO TBCR: EXTENDED_RELEASE_TABLET | ORAL | Status: DC

## 2012-12-26 ENCOUNTER — Other Ambulatory Visit: Payer: Self-pay | Admitting: Pulmonary Disease

## 2013-01-09 ENCOUNTER — Ambulatory Visit (INDEPENDENT_AMBULATORY_CARE_PROVIDER_SITE_OTHER): Payer: Medicare Other | Admitting: *Deleted

## 2013-01-09 ENCOUNTER — Telehealth: Payer: Self-pay | Admitting: Pulmonary Disease

## 2013-01-09 DIAGNOSIS — Z7901 Long term (current) use of anticoagulants: Secondary | ICD-10-CM

## 2013-01-09 DIAGNOSIS — I4891 Unspecified atrial fibrillation: Secondary | ICD-10-CM

## 2013-01-09 DIAGNOSIS — I6350 Cerebral infarction due to unspecified occlusion or stenosis of unspecified cerebral artery: Secondary | ICD-10-CM

## 2013-01-09 LAB — POCT INR: INR: 2.2

## 2013-01-09 MED ORDER — ALLOPURINOL 100 MG PO TABS: ORAL_TABLET | ORAL | Status: DC

## 2013-01-09 NOTE — Telephone Encounter (Signed)
I spoke with daughter. She needs pt allopurinol refilled not metolprolol. This has been done. Nothing further was needed

## 2013-01-14 ENCOUNTER — Ambulatory Visit (INDEPENDENT_AMBULATORY_CARE_PROVIDER_SITE_OTHER): Payer: Medicare Other | Admitting: Pulmonary Disease

## 2013-01-14 ENCOUNTER — Encounter: Payer: Self-pay | Admitting: Pulmonary Disease

## 2013-01-14 ENCOUNTER — Ambulatory Visit (INDEPENDENT_AMBULATORY_CARE_PROVIDER_SITE_OTHER)
Admission: RE | Admit: 2013-01-14 | Discharge: 2013-01-14 | Disposition: A | Discharge status: Home / Self Care | Payer: Medicare Other | Source: Ambulatory Visit | Attending: Pulmonary Disease | Admitting: Pulmonary Disease

## 2013-01-14 VITALS — BP 130/72 | HR 72 | Temp 97.5°F | Ht 71.0 in | Wt 199.0 lb

## 2013-01-14 DIAGNOSIS — Z87898 Personal history of other specified conditions: Secondary | ICD-10-CM

## 2013-01-14 DIAGNOSIS — I281 Aneurysm of pulmonary artery: Secondary | ICD-10-CM

## 2013-01-14 DIAGNOSIS — I509 Heart failure, unspecified: Secondary | ICD-10-CM

## 2013-01-14 DIAGNOSIS — I2789 Other specified pulmonary heart diseases: Secondary | ICD-10-CM

## 2013-01-14 DIAGNOSIS — M109 Gout, unspecified: Secondary | ICD-10-CM

## 2013-01-14 DIAGNOSIS — M159 Polyosteoarthritis, unspecified: Secondary | ICD-10-CM

## 2013-01-14 DIAGNOSIS — H409 Unspecified glaucoma: Secondary | ICD-10-CM

## 2013-01-14 DIAGNOSIS — I359 Nonrheumatic aortic valve disorder, unspecified: Secondary | ICD-10-CM

## 2013-01-14 DIAGNOSIS — E7800 Pure hypercholesterolemia, unspecified: Secondary | ICD-10-CM

## 2013-01-14 DIAGNOSIS — I10 Essential (primary) hypertension: Secondary | ICD-10-CM

## 2013-01-14 DIAGNOSIS — N259 Disorder resulting from impaired renal tubular function, unspecified: Secondary | ICD-10-CM

## 2013-01-14 DIAGNOSIS — I4891 Unspecified atrial fibrillation: Secondary | ICD-10-CM

## 2013-01-14 NOTE — Patient Instructions (Addendum)
Today we updated your med list in our EPIC system...    Continue your current medications the same...  Today we did your follow up CXR... Please return to our lab one morning for your FASTING blood work...    We will contact you w/ the results when available...   Call for any questions...  Let's plan a follow up visit in 71mo, sooner if needed for problems.Marland KitchenMarland Kitchen

## 2013-01-14 NOTE — Progress Notes (Signed)
Subjective:    Patient ID: [name] [name], male    DOB: July 21, 1926, 79 y.o.   MRN: [id number]  HPI 79 y/o BM here for a follow up visit... he has multiple medical problems including HBP, CAD, AS, AFib, & CHF followed by DrHochrein & the [facility];  known PulmHTN & PA aneurysm conservatively managed by DrGearhardt;  Hyperchol;  RI & BPH;  DJD & Gout...  ~  July 19, 2011:  562mo ROV & his CC is poor energy and some DOE but he is too sedentary for too long & we discussed gradual exercise program to incr his stamina;  He saw DrHochrein for Cards f/u 9/12> doing well, rare episode palpit, occas SOB using O2 prn; he reduced his Verapamil dose to 120mg /d, continue Coumadin, & other meds... Repeat 2DEcho 10/12 showed mod LVH, norm sys function w/ EF=55-60% & norm wall motion, Gr2DD, severely calcif AoV leaflets w/ mod AS & 25mm mean gradient (peak=39), mild MR, severe LAdil & RAdil, RV sys function normal, modTR, PAsys=43...  LABS today ok w/ BUN=21, Creat=1.6, K=3.9, BNP=371...  ~  January 16, 2012:  562mo ROV & he remains stable, no new complaints or concerns, just wants Podiatrist to cut his toenails...    He had f/u DrHochrein> Mod AS, chr AFib, DD, etc; stable, no progressive symptoms & pt preferred conserv Rx; they discussed poss TAVR in future...    We reviewed prob list, meds, xrays and labs> see below>> CXR 6/13 showed  Mod cardiomeg unchanged, ectasia & calcif in Ao, enlarged right pulm art (no change), elev left hemidiaph w/ bibasilar atx, NAD... LABS 6/13:  Chems- ok w/ BUN=30 Creat=1.5;  CBC- wnl;  TSH=1.56;  PSA=1.58;  BNP=381   ~  July 16, 2012:  562mo ROV & [name] [name] reports worsening vision due to his Glaucoma- on eye drops & followed closely by Ophthalmology;  BP was recently elev at DrTannenbaum's office & we decided to increase his doses of Clonidine & Metoprolol meds...  We reviewed the following medical problems during today's office visit>>     HBP> on MetoprololER100-1/2 daily,  CalanSR120, Micardis80, Clonidine0.2-1/2Bid, Demadex20-3/d, Epleronone25, K20-3/d; BP=144/92 & he has sl HA; we decided to incr his Metopto 100mg /d & the Clonidine0.2 to 1 tab Bid...    CHF> followed by DrHochrein for Cards- w/ severe Diastolic CHF, normal C's by cath in 2005, & normal LV systolic function w/ EF=75%; notes DOE- no change.    AS> w/ severely calcif AoV leaflets & mod AS w/ 25mm mean gradient (peak=39); pt desired conservative Rx, DrHochrein's notes are reviewed...    AFib> on Coumadin via CC; stable AFib on Coumadin w/ rate control...    Pulm Art aneurysm & PulmHTN> O2sat=94% on RA at rest & he has home O2 but just uses it "prn";  3 cm PA aneurysm followed by DrGearhardt without change x yrs...    CHOL> on Lip20; last FLP 6/12 showed TChol 162, TG 95, HDL 46, LDL 97; he doesn't seem to remember to come fasting for f/u labs...    Renal Insuffic, BPH> on Flomax0.4; baseline Creat= 1.5-1.6 range; followed by DrTannenbaum & managed conservatively...    DJD, Gout> on Tramadol, Allopurinol100, VitD2000; stable w/o specific complaints at present... We reviewed prob list, meds, xrays and labs> see below for updates >> he had the 2013 Flu vaccine in Oct...  NOTE> pt did not go to the lab for his BMet & BNP as requested... ADDENDUM: DrHochrein ordered CDopplers 12/13> mild heterogeneous plaque on right &  mixed irreg plaque on left, 0-39% bilat ICA stenoses & antegrade vertebral flow; f/u 32yr...   ~  January 14, 2013:  4mo ROV & [name] [name] is stable- no new complaints or concerns; he has a sm lipoma on the right post neck area (pointed out by the [employer] nurse for [facility]); We reviewed the following medical problems during today's office visit >>     HBP> on MetoprololER100, CalanSR120, Micardis80, Clonidine0.2Bid, Demadex20-3/d, Epleronone25, K20-3/d; BP=130/80 & he is asked to monitor BP at home more closely; he denies CP, palpit, dizzy, ch in SOB/DOE, edema...    CHF> followed by DrHochrein for  Cards- w/ severe Diastolic CHF, normal C's by cath in 2005, & normal LV systolic function w/ EF=75%; seen 1/14- stable, notes DOE- no change, pt refused f/u 2DEcho, rec conservative Rx...    AS> w/ severely calcif AoV leaflets & mod AS w/ 25mm mean gradient (peak=39); last 2DEcho 10/12- pt desired conservative Rx, DrHochrein's notes are reviewed...    AFib> on Coumadin via CC; stable AFib on Coumadin w/ rate control...    Pulm Art aneurysm & PulmHTN> O2sat=94% on RA at rest & he has home O2 but just uses it "prn";  3cm PA aneurysm followed by DrGearhardt without change x yrs...    CHOL> on Lip20; last FLP 6/12 showed TChol 162, TG 95, HDL 46, LDL 97; asked to ret for f/u FASTING labs    Renal Insuffic, BPH> on Flomax0.4; baseline Creat= 1.4-1.6 range; followed by DrTannenbaum & managed conservatively, last seen 10/13- note reviewed...    DJD, Gout> on Tramadol, Allopurinol100, VitD2000; stable w/o specific complaints at present... We reviewed prob list, meds, xrays and labs> see below for updates >>  CXR 6/14 showed borderline cardiomeg, extremely tort Ao, PA dilatation is the same (no change), elev left hemidiaph, scarring at bases, NAD... LABS 6/14:  Pending...           Problem List:  OPHTHALMOLOGY - s/p cataract surg & followed for worsening Glaucoma on gtts daily...  PULMONARY HYPERTENSION (ICD-416.8) - on home oxygen 2L/min "Prn"... ~  Prev 2DEcho w/ est PAsys= 64... he has not had a right heart cath... ~  Repeat 2DEcho 10/12 showed mod LVH, norm sys function w/ EF=55-60% & norm wall motion, Gr2DD, severely calcif AoV leaflets w/ mod AS & 25mm mean gradient (peak=39), mild MR, severe LAdil & RAdil, RV sys function normal, modTR, PAsys=43... ~  6/13:  O2sat today is 95% on RA at rest;  CXR 6/13 showed mod cardiomeg unchanged, ectasia & calcif in Ao, enlarged right pulm art (no change), elev left hemidiaph w/ bibasilar atx, NAD... ~  6/14:  O2sat today is 94% on RA at rest;  CXR 6/14 showed  borderline cardiomeg, extremely tort Ao, PA dilatation is the same (no change), elev left hemidiaph, scarring at bases, NAD...  HYPERTENSION (ICD-401.9) -  ~  on METOPROLOL ER 100mg - 1/2 tab daily,  VERAPAMIL 240mg /d,  MICARDIS 80mg /d,  CLONIDINE 0.2mg - 1/2 Bid,  DEMADEX 20mg - 2am & 1pm,  EPLERENONE 25mg /d, & K20- 3/d; he was prev on Minoxidil  but this discontinued by Cardiology... ~  6/10: DrHochrein added back the Clonidine & asked him, again, to monitor BP at home. ~  12/10:  they requested trial to decr K20's from 6/d required due to his Demedex... suggest adding ALDACTONE 25mg /d & decr K20 to 4/d w/ f/u labs... ~  6/11:  Aldactone caused gynecomastia & was switched to EPLERENONE 25mg /d by [name] [name] w/ some improvment in  side effect. ~  6/12:  BP controlled & labs sl improved w/ the Demadex decr to 3/d... ~  12/12:  BP= 126/72 today and not checking at home, tolerating meds well; denies HA, visual changes, CP, palipit, dizziness, syncope; he has chr DOE without change and mild edema... ~  6/13:  BP= 120/72 & he remains stable w/o CP, palpit, +SOB w/o change, tr edema... ~  12/13: on MetoprololER100-1/2 daily, CalanSR120, Micardis80, Clonidine0.2-1/2Bid, Demadex20-3/d, Epleronone25, K20-3/d; BP=144/92 & he has sl HA; we decided to incr his Metopto 100mg /d & the Clonidine0.2 to 1 tab Bid. ~  6/14: on MetoprololER100, CalanSR120, Micardis80, Clonidine0.2Bid, Demadex20-3/d, Epleronone25, K20-3/d; BP=130/80 & he is asked to monitor BP at home more closely; he denies CP, palpit, dizzy, ch in SOB/DOE, edema.  CHF (ICD-428.0) - see meds above... followed in the [facility] by DrHochrein;  he has severe Diastolic CHF, w/ normal C's by cath in 2005, & normal LV systolic function w/ EF=75%... notes DOE- no change. ~  labs 6/09 showed BNP= 761 ~  labs 9/09 showed BNP= 486 ~  labs 12/09 showed BNP = 568 ~  labs 6/10 showed BNP= 421 ~  labs 12/10 showed BNP= 577... adding SPIRONOLACTONE 25mg /d. ~  labs  1/11 showed BNP= 395... later ch to EPLERENONE 25mg /d. ~  labs 12/11 showed BNP= 535 ~  Labs 6/12 showed BNP= 524 (on Demedex3/d + Eplerenone25mg /d). ~  Labs 12/12 showed BNP= 371 ~  4/13:  He had f/u DrHochrein> Mod AS, chr AFib, DD, etc; stable, no progressive symptoms & pt preferred conserv Rx; they discussed poss TAVR in future... ~  Labs 6/13 showed BNP= 381 (note: BUN=30 Creat=1.5) ~  1/14:  He had f/u DrHochrein> mult cardiovasc problems, doing well, no CP etc; rec salt & fluid restriction, pt declined f/u 2DEcho, on Coumadin & rate control, continue conservative management...  AORTIC STENOSIS (ICD-424.1) - moderate by 2DEcho & followed by DrHochrein... ~  repeat 2DEcho 1/09 hosp = similiar mod AS... ~  Repeat 2DEcho 1/12 showed mild LVH, norm sys function w/ EF=55-60% & no regional wall motion abn, mod AS, dilated LA&RA, modTR, PAsys=50... ~  Repeat 2DEcho 10/12 showed mod LVH, norm sys function w/ EF=55-60% & norm wall motion, Gr2DD, severely calcif AoV leaflets w/ mod AS & 25mm mean gradient (peak=39), mild MR, severe LAdil & RAdil, RV sys function normal, modTR, PAsys=43...  ATRIAL FIBRILLATION (ICD-427.31) - on COUMADIN & followed in the Coumadin Clinic. ~  EKG 4/13 showed AFib, rate82, LAD, NSSTTWA...  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 ~  CXR 1/09 showed unchanged right PA aneurysm, stable cardiomeg, elevated left hemidiaph w/ atx. ~  CXR 6/10 showed no change, Ao tortuous and calcif, NAD. ~  CXR 12/11 showed stable elv left hemidiaph, bibasilar atx/ scarring, cardiomeg, ectatic ao, right PA enlargement. ~  CXR 6/13 showed mod cardiomeg unchanged, ectasia & calcif in Ao, enlarged right pulm art (no change), elev left hemidiaph w/ bibasilar atx, NAD.Marland Kitchen. ~  CXR 6/14 showed borderline cardiomeg, tortAo, PA dilatation is the same (no change), elev left hemidiaph, scarring at bases, NAD.  ASPVD >> on Coumadin for  his AFib... ~  DrHochrein ordered CDopplers 12/13> mild heterogeneous plaque on right & mixed irreg plaque on left, 0-39% bilat ICA stenoses & antegrade vertebral flow; f/u 5yr...  HYPERCHOLESTEROLEMIA (ICD-272.0) - now on LIPITOR 20mg /d (prev Simva40 changed due to concomitant Verap). ~  FLP 1/09 on Simva40 showed  TChol 95, TG 39, HDL 36, LDL 51 ~  FLP 12/09 on Simva40 showed TChol 167, TG 68, HDL 64, LDL 90 ~  FLP 1/11 on Simva40 showed TChol 153, TG 101, HDL 50, LDL 83... later ch to Lip20. ~  FLP 6/12 on Lip20 showed TChol 162, TG 95, HDL 46, LDL 97 ~  6/13: not fasting for FLP today... ~  6/14: pt asked to ret for FASTING labs  HEMORRHOIDS (ICD-455.6)  RENAL INSUFFICIENCY (ICD-588.9)  ~  Creat=1.4 - 1.6 during 1/09 hosp... ~  labs 6/09 & 9/09 showed Creat= 1.3 ~  labs 12/09 showed BUN= 20, Creat= 1.3, K= 4.0 ~  labs 6/10 showed BUN= 24, Creat= 1.3, K= 3.8 ~  labs 12/10 showed BUN= 12, Creat= 1.2, K= 3.6 (on 6 K20/d) ~  labs 4/11 showed BUN= 18, Creat= 1.4, K= 4.0 (on 4 KCl/d) ~  labs 12/11 showed BUN= 32, Creat= 1.8, K= 4.3 ~  Labs 6/12 showed BUN= 27, creat= 1.6, K= 4.1 ~  Labs 12/12 showed BUN=21, Creat=1.6, K=3.9 ~  Labs 6/13 showed BUN= 30, Creat= 1.5, K= 4.5  BENIGN PROSTATIC HYPERTROPHY, HX OF (ICD-V13.8) - followed by DrTannenbaum & taking FLOMAX 0.4mg /d... last seen 5/09 and his note is reviewed... ~  labs 12/09 showed PSA= 1.42 ~  labs 2/11 showed PSA= 1.65 ~  Labs 6/13 showed PSA= 1.58 ~  10/13: he had Urology f/u DrTannenbaum> BPH w/ BOO, nocturia, mild Uincont; he didn't want any intervention & no changes made to his regimen...  CVA (ICD-434.91) - Stroke in 1990 in the setting of HBP...  DEGENERATIVE JOINT DISEASE, GENERALIZED (ICD-715.00) - he uses OTC pain meds & TRAMADOL 50mg  Prn...  GOUT (ICD-274.9) - on ALLOPURINOL 100mg /d,  & now off the Colchicine... ~  labs 10/08 showed Uric= 10.3 ~  labs 12/09 showed Uric= 7.7.Marland Kitchen. he wants to continue same meds. ~  labs  12/11 showed Uric = 6.4  INGUINAL HERNIA (ICD-550.90)  Health Maintenance - he receives the yearly seasonal Flu vaccines... f/u PNEUMOVAX at his request 2010...   Past Surgical History  Procedure Laterality Date  . Appendectomy    . Inguinal hernia repair    . Cataract extraction    . Cosmetic eye surgery      Outpatient Encounter Prescriptions as of 01/14/2013  Medication Sig Dispense Refill  . allopurinol (ZYLOPRIM) 100 MG tablet TAKE 1 TABLET BY MOUTH DAILY  30 tablet  3  . atorvastatin (LIPITOR) 20 MG tablet Take 1 tablet (20 mg total) by mouth daily.  30 tablet  10  . bimatoprost (LUMIGAN) 0.03 % ophthalmic solution Place 1 drop into both eyes at bedtime.        . brinzolamide (AZOPT) 1 % ophthalmic suspension Place 1 drop into both eyes 2 (two) times daily.        . Cholecalciferol (VITAMIN D) 2000 UNITS CAPS Take 1 capsule by mouth daily.        . cloNIDine (CATAPRES) 0.2 MG tablet TAKE 1 TABLET BY MOUTH TWICE DAILY  60 tablet  0  . eplerenone (INSPRA) 25 MG tablet Take 1 tablet (25 mg total) by mouth daily.  30 tablet  5  . eplerenone (INSPRA) 25 MG tablet TAKE 1 TABLET BY MOUTH EVERY DAY  30 tablet  0  . meclizine (ANTIVERT) 25 MG tablet Take 1 tablet (25 mg total) by mouth 3 (three) times daily as needed.  50 tablet  5  . metoprolol succinate (TOPROL-XL) 100 MG 24 hr tablet  Take 100 mg by mouth daily.      Marland Kitchen MICARDIS 80 MG tablet TAKE 1 TABLET BY MOUTH EVERY DAY  30 tablet  6  . Multiple Vitamins-Minerals (ICAPS) TABS Take 1 tablet by mouth 2 (two) times daily.        . pilocarpine (PILOCAR) 4 % ophthalmic solution 1 drop into left eye every 4 hrs as needed      . potassium chloride SA (K-DUR,KLOR-CON) 20 MEQ tablet TAKE 3 TABLETS BY MOUTH DAILY  270 tablet  0  . Tamsulosin HCl (FLOMAX) 0.4 MG CAPS Take 0.4 mg by mouth daily.        Marland Kitchen torsemide (DEMADEX) 20 MG tablet TAKE 2 TABLETS BY MOUTH AM AND 1 BY MOUTH EVERY EVENING  90 tablet  6  . torsemide (DEMADEX) 20 MG tablet  TAKE 2 TABLETS BY MOUTH EVERY MORNING AND 1 TABLET BY MOUTH EVERY EVENING  90 tablet  2  . verapamil (CALAN-SR) 120 MG CR tablet Take 1 tablet (120 mg total) by mouth at bedtime.  30 tablet  3  . warfarin (COUMADIN) 5 MG tablet Take as directed by coumadin clinic  90 tablet  1   No facility-administered encounter medications on file as of 01/14/2013.    Allergies  Allergen Reactions  . Ibuprofen     REACTION: causes low blood pressure    Current Medications, Allergies, Past Medical History, Past Surgical History, Family History, and Social History were reviewed in [employer] record.    Review of Systems        See HPI - all other systems neg except as noted... The patient complains of decreased hearing, dyspnea on exertion, peripheral edema, and difficulty walking.  The patient denies anorexia, fever, weight loss, weight gain, vision loss, hoarseness, chest pain, syncope, prolonged cough, headaches, hemoptysis, abdominal pain, melena, hematochezia, severe indigestion/heartburn, hematuria, incontinence, muscle weakness, suspicious skin lesions, transient blindness, depression, unusual weight change, abnormal bleeding, enlarged lymph nodes, and angioedema.  Objective:   Physical Exam     WD, WN, 79 y/o BM in NAD... GENERAL:  Alert & oriented; pleasant & cooperative... HEENT:  [place]/AT, EOM-wnl, PERRLA, EACs-clear, TMs-wnl, NOSE-clear, THROAT-clear & wnl. NECK:  Supple w/ fairROM; no JVD; sl decr carotid impulses w/ transmit murmur; no thyromegaly or nodules palpated; no lymphadenopathy. CHEST:  Clear to P & A; without wheezes/ rales/ or rhonchi heard... HEART:  sl irreg, gr 2/6 AS murmur at base, without rubs or gallops apprec... ABDOMEN:  Soft & nontender; normal bowel sounds; no organomegaly or masses detected. EXT: without deformities, mild arthritic changes; no varicose veins/ +venous insuffic/ tr edema. NEURO:  CN's intact; no focal neuro deficits... DERM:  No  lesions noted; no rash etc...  RADIOLOGY DATA:  Reviewed in the EPIC EMR & discussed w/ the patient...  LABORATORY DATA:  Reviewed in the EPIC EMR & discussed w/ the patient...   Assessment & Plan:    HBP>  Fair control on his extensive med regimen; we reviewed meds w/ family- they need to supervise & be sure he takes everything regularly...  CHF>  Stable on current meds; renal w/ Creat=1.5...  AS>  Mod AS on 2DEcho & followed by DrHochrein (pt has declined f/u 2DEcho)  AFib>  He remains on Coumadin followed in the CC...  PA art aneurysm & Pulm HTN>  Stable on current med regimen & BP control...  CHOL>  FLP looks good on Lip20 when last checked 6/12...  Renal Insuffic>  Creat  improved to 1.5-1.6 & stable on current meds...  DJD/ Gout>  Stable as well, c/o some rib pain as noted; we discussed rest/ heat/ rib binder & Tramadol...  Other medical problems as noted...   Patient's Medications  New Prescriptions   No medications on file  Previous Medications   ALLOPURINOL (ZYLOPRIM) 100 MG TABLET    TAKE 1 TABLET BY MOUTH DAILY   ATORVASTATIN (LIPITOR) 20 MG TABLET    Take 1 tablet (20 mg total) by mouth daily.   BIMATOPROST (LUMIGAN) 0.03 % OPHTHALMIC SOLUTION    Place 1 drop into both eyes at bedtime.     BRINZOLAMIDE (AZOPT) 1 % OPHTHALMIC SUSPENSION    Place 1 drop into both eyes 2 (two) times daily.     CHOLECALCIFEROL (VITAMIN D) 2000 UNITS CAPS    Take 1 capsule by mouth daily.     CLONIDINE (CATAPRES) 0.2 MG TABLET    TAKE 1 TABLET BY MOUTH TWICE DAILY   EPLERENONE (INSPRA) 25 MG TABLET    Take 1 tablet (25 mg total) by mouth daily.   EPLERENONE (INSPRA) 25 MG TABLET    TAKE 1 TABLET BY MOUTH EVERY DAY   MECLIZINE (ANTIVERT) 25 MG TABLET    Take 1 tablet (25 mg total) by mouth 3 (three) times daily as needed.   METOPROLOL SUCCINATE (TOPROL-XL) 100 MG 24 HR TABLET    Take 100 mg by mouth daily.   MICARDIS 80 MG TABLET    TAKE 1 TABLET BY MOUTH EVERY DAY   MULTIPLE  VITAMINS-MINERALS (ICAPS) TABS    Take 1 tablet by mouth 2 (two) times daily.     PILOCARPINE (PILOCAR) 4 % OPHTHALMIC SOLUTION    1 drop into left eye every 4 hrs as needed   POTASSIUM CHLORIDE SA (K-DUR,KLOR-CON) 20 MEQ TABLET    TAKE 3 TABLETS BY MOUTH DAILY   TAMSULOSIN HCL (FLOMAX) 0.4 MG CAPS    Take 0.4 mg by mouth daily.     TORSEMIDE (DEMADEX) 20 MG TABLET    TAKE 2 TABLETS BY MOUTH AM AND 1 BY MOUTH EVERY EVENING   TORSEMIDE (DEMADEX) 20 MG TABLET    TAKE 2 TABLETS BY MOUTH EVERY MORNING AND 1 TABLET BY MOUTH EVERY EVENING   VERAPAMIL (CALAN-SR) 120 MG CR TABLET    Take 1 tablet (120 mg total) by mouth at bedtime.   WARFARIN (COUMADIN) 5 MG TABLET    Take as directed by coumadin clinic  Modified Medications   No medications on file  Discontinued Medications   No medications on file

## 2013-01-27 ENCOUNTER — Other Ambulatory Visit: Payer: Self-pay | Admitting: Pulmonary Disease

## 2013-01-29 ENCOUNTER — Other Ambulatory Visit (INDEPENDENT_AMBULATORY_CARE_PROVIDER_SITE_OTHER): Payer: Medicare Other

## 2013-01-29 DIAGNOSIS — I10 Essential (primary) hypertension: Secondary | ICD-10-CM

## 2013-01-29 DIAGNOSIS — E7800 Pure hypercholesterolemia, unspecified: Secondary | ICD-10-CM

## 2013-01-29 DIAGNOSIS — I4891 Unspecified atrial fibrillation: Secondary | ICD-10-CM

## 2013-01-29 LAB — CBC WITH DIFFERENTIAL/PLATELET
Basophils Absolute: 0 10*3/uL (ref 0.0–0.1)
Eosinophils Relative: 4.1 % (ref 0.0–5.0)
HCT: 43.8 % (ref 39.0–52.0)
Hemoglobin: 14.3 g/dL (ref 13.0–17.0)
Lymphs Abs: 1.8 10*3/uL (ref 0.7–4.0)
Monocytes Relative: 7.6 % (ref 3.0–12.0)
Neutro Abs: 4.6 10*3/uL (ref 1.4–7.7)
RDW: 16.8 % — ABNORMAL HIGH (ref 11.5–14.6)

## 2013-01-29 LAB — LIPID PANEL
Cholesterol: 150 mg/dL (ref 0–200)
HDL: 51.2 mg/dL (ref >39.00)
Total CHOL/HDL Ratio: 3
Triglycerides: 89 mg/dL (ref 0.0–149.0)

## 2013-01-29 LAB — BASIC METABOLIC PANEL
BUN: 26 mg/dL — ABNORMAL HIGH (ref 6–23)
CO2: 27 mEq/L (ref 19–32)
Calcium: 9.4 mg/dL (ref 8.4–10.5)
Creatinine, Ser: 1.5 mg/dL (ref 0.4–1.5)
Glucose, Bld: 113 mg/dL — ABNORMAL HIGH (ref 70–99)

## 2013-01-29 LAB — HEPATIC FUNCTION PANEL
ALT: 11 U/L (ref 0–53)
Albumin: 4.3 g/dL (ref 3.5–5.2)
Bilirubin, Direct: 0.2 mg/dL (ref 0.0–0.3)
Total Protein: 8.1 g/dL (ref 6.0–8.3)

## 2013-02-05 ENCOUNTER — Other Ambulatory Visit: Payer: Self-pay | Admitting: *Deleted

## 2013-02-05 MED ORDER — WARFARIN SODIUM 5 MG PO TABS: ORAL_TABLET | ORAL | Status: DC

## 2013-02-18 ENCOUNTER — Ambulatory Visit (INDEPENDENT_AMBULATORY_CARE_PROVIDER_SITE_OTHER): Payer: Medicare Other | Admitting: *Deleted

## 2013-02-18 DIAGNOSIS — I4891 Unspecified atrial fibrillation: Secondary | ICD-10-CM

## 2013-02-18 DIAGNOSIS — Z7901 Long term (current) use of anticoagulants: Secondary | ICD-10-CM

## 2013-02-18 DIAGNOSIS — I6350 Cerebral infarction due to unspecified occlusion or stenosis of unspecified cerebral artery: Secondary | ICD-10-CM

## 2013-02-25 ENCOUNTER — Other Ambulatory Visit: Payer: Self-pay | Admitting: Pulmonary Disease

## 2013-03-11 ENCOUNTER — Other Ambulatory Visit: Payer: Self-pay | Admitting: Pulmonary Disease

## 2013-04-04 ENCOUNTER — Ambulatory Visit (INDEPENDENT_AMBULATORY_CARE_PROVIDER_SITE_OTHER): Payer: Medicare Other

## 2013-04-04 DIAGNOSIS — Z7901 Long term (current) use of anticoagulants: Secondary | ICD-10-CM

## 2013-04-04 DIAGNOSIS — I6350 Cerebral infarction due to unspecified occlusion or stenosis of unspecified cerebral artery: Secondary | ICD-10-CM

## 2013-04-04 DIAGNOSIS — I4891 Unspecified atrial fibrillation: Secondary | ICD-10-CM

## 2013-04-28 ENCOUNTER — Other Ambulatory Visit: Payer: Self-pay

## 2013-04-28 MED ORDER — VERAPAMIL HCL ER 120 MG PO TBCR: 120.0000 mg | EXTENDED_RELEASE_TABLET | Freq: Every day | ORAL | Status: DC

## 2013-05-02 ENCOUNTER — Ambulatory Visit (INDEPENDENT_AMBULATORY_CARE_PROVIDER_SITE_OTHER): Payer: Medicare Other | Admitting: *Deleted

## 2013-05-02 DIAGNOSIS — Z7901 Long term (current) use of anticoagulants: Secondary | ICD-10-CM

## 2013-05-02 DIAGNOSIS — I4891 Unspecified atrial fibrillation: Secondary | ICD-10-CM

## 2013-05-02 DIAGNOSIS — I6350 Cerebral infarction due to unspecified occlusion or stenosis of unspecified cerebral artery: Secondary | ICD-10-CM

## 2013-05-02 LAB — POCT INR: INR: 2.9

## 2013-05-05 ENCOUNTER — Ambulatory Visit (INDEPENDENT_AMBULATORY_CARE_PROVIDER_SITE_OTHER): Payer: Medicare Other

## 2013-05-05 DIAGNOSIS — Z23 Encounter for immunization: Secondary | ICD-10-CM

## 2013-05-12 ENCOUNTER — Other Ambulatory Visit: Payer: Self-pay | Admitting: Pulmonary Disease

## 2013-05-30 ENCOUNTER — Ambulatory Visit (INDEPENDENT_AMBULATORY_CARE_PROVIDER_SITE_OTHER): Payer: Medicare Other | Admitting: General Practice

## 2013-05-30 DIAGNOSIS — I6350 Cerebral infarction due to unspecified occlusion or stenosis of unspecified cerebral artery: Secondary | ICD-10-CM

## 2013-05-30 DIAGNOSIS — Z7901 Long term (current) use of anticoagulants: Secondary | ICD-10-CM

## 2013-05-30 DIAGNOSIS — I4891 Unspecified atrial fibrillation: Secondary | ICD-10-CM

## 2013-05-30 LAB — POCT INR: INR: 2.6

## 2013-06-15 ENCOUNTER — Other Ambulatory Visit: Payer: Self-pay | Admitting: Cardiovascular Disease

## 2013-07-11 ENCOUNTER — Ambulatory Visit (INDEPENDENT_AMBULATORY_CARE_PROVIDER_SITE_OTHER): Payer: Medicare Other | Admitting: *Deleted

## 2013-07-11 DIAGNOSIS — I6350 Cerebral infarction due to unspecified occlusion or stenosis of unspecified cerebral artery: Secondary | ICD-10-CM

## 2013-07-11 DIAGNOSIS — Z7901 Long term (current) use of anticoagulants: Secondary | ICD-10-CM

## 2013-07-11 DIAGNOSIS — I4891 Unspecified atrial fibrillation: Secondary | ICD-10-CM

## 2013-07-11 LAB — POCT INR: INR: 2.6

## 2013-07-15 ENCOUNTER — Ambulatory Visit (INDEPENDENT_AMBULATORY_CARE_PROVIDER_SITE_OTHER): Payer: Medicare Other | Admitting: Pulmonary Disease

## 2013-07-15 ENCOUNTER — Encounter: Payer: Self-pay | Admitting: Pulmonary Disease

## 2013-07-15 VITALS — BP 132/78 | HR 78 | Temp 97.7°F | Ht 71.0 in | Wt 195.4 lb

## 2013-07-15 DIAGNOSIS — M109 Gout, unspecified: Secondary | ICD-10-CM

## 2013-07-15 DIAGNOSIS — Z7901 Long term (current) use of anticoagulants: Secondary | ICD-10-CM

## 2013-07-15 DIAGNOSIS — N259 Disorder resulting from impaired renal tubular function, unspecified: Secondary | ICD-10-CM

## 2013-07-15 DIAGNOSIS — I359 Nonrheumatic aortic valve disorder, unspecified: Secondary | ICD-10-CM

## 2013-07-15 DIAGNOSIS — I4891 Unspecified atrial fibrillation: Secondary | ICD-10-CM

## 2013-07-15 DIAGNOSIS — I509 Heart failure, unspecified: Secondary | ICD-10-CM

## 2013-07-15 DIAGNOSIS — Z87898 Personal history of other specified conditions: Secondary | ICD-10-CM

## 2013-07-15 DIAGNOSIS — I10 Essential (primary) hypertension: Secondary | ICD-10-CM

## 2013-07-15 DIAGNOSIS — I281 Aneurysm of pulmonary artery: Secondary | ICD-10-CM

## 2013-07-15 DIAGNOSIS — E7800 Pure hypercholesterolemia, unspecified: Secondary | ICD-10-CM

## 2013-07-15 DIAGNOSIS — M159 Polyosteoarthritis, unspecified: Secondary | ICD-10-CM

## 2013-07-15 DIAGNOSIS — I2789 Other specified pulmonary heart diseases: Secondary | ICD-10-CM

## 2013-07-15 MED ORDER — TORSEMIDE 20 MG PO TABS: ORAL_TABLET | ORAL | Status: DC

## 2013-07-15 MED ORDER — ALLOPURINOL 100 MG PO TABS: ORAL_TABLET | ORAL | Status: DC

## 2013-07-15 NOTE — Progress Notes (Signed)
Subjective:    Patient ID: [name] [name], male    DOB: 04-Sep-1925, 79 y.o.   MRN: [id number]  HPI 79 y/o BM here for a follow up visit... he has multiple medical problems including HBP, CAD, AS, AFib, & CHF followed by DrHochrein & the [facility];  known PulmHTN & PA aneurysm conservatively managed by DrGearhardt;  Hyperchol;  RI & BPH;  DJD & Gout...  ~  January 16, 2012:  18mo ROV & he remains stable, no new complaints or concerns, just wants Podiatrist to cut his toenails...    He had f/u DrHochrein> Mod AS, chr AFib, DD, etc; stable, no progressive symptoms & pt preferred conserv Rx; they discussed poss TAVR in future...    We reviewed prob list, meds, xrays and labs> see below>> CXR 6/13 showed  Mod cardiomeg unchanged, ectasia & calcif in Ao, enlarged right pulm art (no change), elev left hemidiaph w/ bibasilar atx, NAD... LABS 6/13:  Chems- ok w/ BUN=30 Creat=1.5;  CBC- wnl;  TSH=1.56;  PSA=1.58;  BNP=381   ~  July 16, 2012:  18mo ROV & [name] reports worsening vision due to his Glaucoma- on eye drops & followed closely by Ophthalmology;  BP was recently elev at DrTannenbaum's office & we decided to increase his doses of Clonidine & Metoprolol meds...  We reviewed the following medical problems during today's office visit>>     HBP> on MetoprololER100-1/2 daily, CalanSR120, Micardis80, Clonidine0.2-1/2Bid, Demadex20-3/d, Epleronone25, K20-3/d; BP=144/92 & he has sl HA; we decided to incr his Metopto 100mg /d & the Clonidine0.2 to 1 tab Bid...    CHF> followed by DrHochrein for Cards- w/ severe Diastolic CHF, normal C's by cath in 2005, & normal LV systolic function w/ EF=75%; notes DOE- no change.    AS> w/ severely calcif AoV leaflets & mod AS w/ 25mm mean gradient (peak=39); pt desired conservative Rx, DrHochrein's notes are reviewed...    AFib> on Coumadin via CC; stable AFib on Coumadin w/ rate control...    Pulm Art aneurysm & PulmHTN> O2sat=94% on RA at rest & he has home O2 but just  uses it "prn";  3 cm PA aneurysm followed by DrGearhardt without change x yrs...    CHOL> on Lip20; last FLP 6/12 showed TChol 162, TG 95, HDL 46, LDL 97; he doesn't seem to remember to come fasting for f/u labs...    Renal Insuffic, BPH> on Flomax0.4; baseline Creat= 1.5-1.6 range; followed by DrTannenbaum & managed conservatively...    DJD, Gout> on Tramadol, Allopurinol100, VitD2000; stable w/o specific complaints at present... We reviewed prob list, meds, xrays and labs> see below for updates >> he had the 2013 Flu vaccine in Oct...  NOTE> pt did not go to the lab for his BMet & BNP as requested... ADDENDUM: DrHochrein ordered CDopplers 12/13> mild heterogeneous plaque on right & mixed irreg plaque on left, 0-39% bilat ICA stenoses & antegrade vertebral flow; f/u 60yr...   ~  January 14, 2013:  18mo ROV & [name] [name] is stable- no new complaints or concerns; he has a sm lipoma on the right post neck area (pointed out by the [employer] nurse for [facility]); We reviewed the following medical problems during today's office visit >>     HBP> on MetoprololER100, CalanSR120, Micardis80, Clonidine0.2Bid, Demadex20-3/d, Epleronone25, K20-3/d; BP=130/80 & he is asked to monitor BP at home more closely; he denies CP, palpit, dizzy, ch in SOB/DOE, edema...    CHF> followed by DrHochrein for Cards- w/ severe Diastolic CHF, normal C's  by cath in 2005, & normal LV systolic function w/ EF=75%; seen 1/14- stable, notes DOE- no change, pt refused f/u 2DEcho, rec conservative Rx...    AS> w/ severely calcif AoV leaflets & mod AS w/ 25mm mean gradient (peak=39); last 2DEcho 10/12- pt desired conservative Rx, DrHochrein's notes are reviewed...    AFib> on Coumadin via CC; stable AFib on Coumadin w/ rate control...    Pulm Art aneurysm & PulmHTN> O2sat=94% on RA at rest & he has home O2 but just uses it "prn";  3cm PA aneurysm followed by DrGearhardt without change x yrs...    CHOL> on Lip20; last FLP 6/12 showed TChol 162, TG 95,  HDL 46, LDL 97; asked to ret for f/u FASTING labs    Renal Insuffic, BPH> on Flomax0.4; baseline Creat= 1.4-1.6 range; followed by DrTannenbaum & managed conservatively, last seen 10/13- note reviewed...    DJD, Gout> on Tramadol, Allopurinol100, VitD2000; stable w/o specific complaints at present... We reviewed prob list, meds, xrays and labs> see below for updates >>  CXR 6/14 showed borderline cardiomeg, extremely tort Ao, PA dilatation is the same (no change), elev left hemidiaph, scarring at bases, NAD... LABS 6/14:  Pending...  ~  July 15, 2013:  35mo ROV & [name] [name] is stable, here w/ his daighter who confirms; as noted he is blind in left eye w/ poor vision on right as well (can't read or drive, can see some TV from a distance), hx Glaucoma Rx by DrBond; he has not been exercising much- has a Associate Professor named [name] & I suggested he get out & walk the dog w/ family help...  He has some minor somatic complaints- notes "ridges, wrinkles" on his scalp that seem to come & go, notes mild arthritis pain in back, VI/ edema in legs; we offered reassurance, discussed Aleve, Tylenol, no salt, elevation, support hose & continue current meds...    HBP> on MetoprololER100, CalanSR120, Micardis80, Clonidine0.2Bid, Demadex20-3/d, Epleronone25, K20-3/d; BP=132/78 & he denies CP, palpit, dizzy, ch in SOB/DOE, edema...    CHF> followed by DrHochrein for Cards- w/ severe Diastolic CHF, normal C's by cath in 2005, & normal LV systolic function w/ EF=75%; seen 1/14- stable, notes DOE- no change, pt refused f/u 2DEcho, rec conservative Rx...    AS> w/ severely calcif AoV leaflets & mod AS w/ 25mm mean gradient (peak=39); last 2DEcho 10/12- pt desired conservative Rx, DrHochrein's notes are reviewed...    AFib> on Coumadin via CC; stable AFib on Coumadin w/ rate control from Metop & Verap...    Pulm Art aneurysm & PulmHTN> O2sat=98% on RA at rest today & he has home O2 but just uses it "prn";  3cm PA aneurysm  followed by DrGearhardt without change x yrs...    CHOL> on Lip20; last FLP 6/14 showed TChol 150, TG 89, HDL 51, LDL 81... Continue same rx...    Renal Insuffic, BPH> on Flomax0.4; baseline Creat= 1.4-1.6 range; followed by DrTannenbaum & managed conservatively, last seen 10/13- note reviewed...    DJD, Gout> on Allopurinol100, VitD2000, Aleve/ Tylenol; stable w/o specific complaints at present...           Problem List:  OPHTHALMOLOGY - s/p cataract surg & followed by DrBond for worsening Glaucoma & poor vision on gtts daily...  PULMONARY HYPERTENSION (ICD-416.8) - on home oxygen 2L/min "Prn"... ~  Prev 2DEcho w/ est PAsys= 64... he has not had a right heart cath... ~  Repeat 2DEcho 10/12 showed mod LVH, norm sys function w/  EF=55-60% & norm wall motion, Gr2DD, severely calcif AoV leaflets w/ mod AS & 25mm mean gradient (peak=39), mild MR, severe LAdil & RAdil, RV sys function normal, modTR, PAsys=43... ~  6/13:  O2sat today is 95% on RA at rest;  CXR 6/13 showed mod cardiomeg unchanged, ectasia & calcif in Ao, enlarged right pulm art (no change), elev left hemidiaph w/ bibasilar atx, NAD... ~  6/14:  O2sat today is 94% on RA at rest;  CXR 6/14 showed borderline cardiomeg, extremely tort Ao, PA dilatation is the same (no change), elev left hemidiaph, scarring at bases, NAD...  HYPERTENSION (ICD-401.9) -  ~  on METOPROLOL ER 100mg - 1/2 tab daily,  VERAPAMIL 240mg /d,  MICARDIS 80mg /d,  CLONIDINE 0.2mg - 1/2 Bid,  DEMADEX 20mg - 2am & 1pm,  EPLERENONE 25mg /d, & K20- 3/d; he was prev on Minoxidil  but this discontinued by Cardiology... ~  6/10: DrHochrein added back the Clonidine & asked him, again, to monitor BP at home. ~  12/10:  they requested trial to decr K20's from 6/d required due to his Demedex... suggest adding ALDACTONE 25mg /d & decr K20 to 4/d w/ f/u labs... ~  6/11:  Aldactone caused gynecomastia & was switched to EPLERENONE 25mg /d by [name] w/ some improvment in side effect. ~   6/12:  BP controlled & labs sl improved w/ the Demadex decr to 3/d... ~  12/12:  BP= 126/72 today and not checking at home, tolerating meds well; denies HA, visual changes, CP, palipit, dizziness, syncope; he has chr DOE without change and mild edema... ~  6/13:  BP= 120/72 & he remains stable w/o CP, palpit, +SOB w/o change, tr edema... ~  12/13: on MetoprololER100-1/2 daily, CalanSR120, Micardis80, Clonidine0.2-1/2Bid, Demadex20-3/d, Epleronone25, K20-3/d; BP=144/92 & he has sl HA; we decided to incr his Metopto 100mg /d & the Clonidine0.2 to 1 tab Bid. ~  6/14: on MetoprololER100, CalanSR120, Micardis80, Clonidine0.2Bid, Demadex20-3/d, Epleronone25, K20-3/d; BP=130/80 & he is asked to monitor BP at home more closely; he denies CP, palpit, dizzy, ch in SOB/DOE, edema. ~  12/14: on MetoprololER100, CalanSR120, Micardis80, Clonidine0.2Bid, Demadex20-3/d, Epleronone25, K20-3/d; BP=132/78 & he denies CP, palpit, dizzy, ch in SOB/DOE, edema.  CHF (ICD-428.0) - see meds above... followed in the [facility] by DrHochrein;  he has severe Diastolic CHF, w/ normal C's by cath in 2005, & normal LV systolic function w/ EF=75%... notes DOE- no change. ~  labs 6/09 showed BNP= 761 ~  labs 9/09 showed BNP= 486 ~  labs 12/09 showed BNP = 568 ~  labs 6/10 showed BNP= 421 ~  labs 12/10 showed BNP= 577... adding SPIRONOLACTONE 25mg /d. ~  labs 1/11 showed BNP= 395... later ch to EPLERENONE 25mg /d. ~  labs 12/11 showed BNP= 535 ~  Labs 6/12 showed BNP= 524 (on Demedex3/d + Eplerenone25mg /d). ~  Labs 12/12 showed BNP= 371 ~  4/13:  He had f/u DrHochrein> Mod AS, chr AFib, DD, etc; stable, no progressive symptoms & pt preferred conserv Rx; they discussed poss TAVR in future... ~  Labs 6/13 showed BNP= 381 (note: BUN=30 Creat=1.5) ~  1/14:  He had f/u DrHochrein> mult cardiovasc problems, doing well, no CP etc; rec salt & fluid restriction, pt declined f/u 2DEcho, on Coumadin & rate control, continue conservative  management... ~  Stable on diuretic therapy, no changes made...  AORTIC STENOSIS (ICD-424.1) - moderate by 2DEcho & followed by DrHochrein... ~  repeat 2DEcho 1/09 hosp = similiar mod AS... ~  Repeat 2DEcho 1/12 showed mild LVH, norm sys function w/  EF=55-60% & no regional wall motion abn, mod AS, dilated LA&RA, modTR, PAsys=50... ~  Repeat 2DEcho 10/12 showed mod LVH, norm sys function w/ EF=55-60% & norm wall motion, Gr2DD, severely calcif AoV leaflets w/ mod AS & 25mm mean gradient (peak=39), mild MR, severe LAdil & RAdil, RV sys function normal, modTR, PAsys=43...  ATRIAL FIBRILLATION (ICD-427.31) - on COUMADIN & followed in the Coumadin Clinic. ~  EKG 4/13 showed AFib, rate82, LAD, NSSTTWA...  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 ~  CXR 1/09 showed unchanged right PA aneurysm, stable cardiomeg, elevated left hemidiaph w/ atx. ~  CXR 6/10 showed no change, Ao tortuous and calcif, NAD. ~  CXR 12/11 showed stable elv left hemidiaph, bibasilar atx/ scarring, cardiomeg, ectatic ao, right PA enlargement. ~  CXR 6/13 showed mod cardiomeg unchanged, ectasia & calcif in Ao, enlarged right pulm art (no change), elev left hemidiaph w/ bibasilar atx, NAD.Marland Kitchen. ~  CXR 6/14 showed borderline cardiomeg, tortAo, PA dilatation is the same (no change), elev left hemidiaph, scarring at bases, NAD.  ASPVD >> on Coumadin for his AFib... ~  DrHochrein ordered CDopplers 12/13> mild heterogeneous plaque on right & mixed irreg plaque on left, 0-39% bilat ICA stenoses & antegrade vertebral flow; f/u 13yr...  HYPERCHOLESTEROLEMIA (ICD-272.0) - now on LIPITOR 20mg /d (prev Simva40 changed due to concomitant Verap). ~  FLP 1/09 on Simva40 showed TChol 95, TG 39, HDL 36, LDL 51 ~  FLP 12/09 on Simva40 showed TChol 167, TG 68, HDL 64, LDL 90 ~  FLP 1/11 on Simva40 showed TChol 153, TG 101, HDL 50, LDL 83... later ch to Lip20. ~  FLP 6/12 on Lip20 showed  TChol 162, TG 95, HDL 46, LDL 97 ~  6/13: not fasting for FLP today... ~  6/14: FLP on Lip20 showed TChol 150, TG 89, HDL 51, LDL 81...  HEMORRHOIDS (ICD-455.6)  RENAL INSUFFICIENCY (ICD-588.9)  ~  Creat=1.4 - 1.6 during 1/09 hosp... ~  labs 6/09 & 9/09 showed Creat= 1.3 ~  labs 12/09 showed BUN= 20, Creat= 1.3, K= 4.0 ~  labs 6/10 showed BUN= 24, Creat= 1.3, K= 3.8 ~  labs 12/10 showed BUN= 12, Creat= 1.2, K= 3.6 (on 6 K20/d) ~  labs 4/11 showed BUN= 18, Creat= 1.4, K= 4.0 (on 4 KCl/d) ~  labs 12/11 showed BUN= 32, Creat= 1.8, K= 4.3 ~  Labs 6/12 showed BUN= 27, creat= 1.6, K= 4.1 ~  Labs 12/12 showed BUN=21, Creat=1.6, K=3.9 ~  Labs 6/13 showed BUN= 30, Creat= 1.5, K= 4.5 ~  Labs 6/14 showed BUN= 26, Cr= 1.5, K= 3.5 (encouraged to take the KCl - 3/d)...  BENIGN PROSTATIC HYPERTROPHY, HX OF (ICD-V13.8) - followed by DrTannenbaum & taking FLOMAX 0.4mg /d... last seen 5/09 and his note is reviewed... ~  labs 12/09 showed PSA= 1.42 ~  labs 2/11 showed PSA= 1.65 ~  Labs 6/13 showed PSA= 1.58 ~  10/13: he had Urology f/u DrTannenbaum> BPH w/ BOO, nocturia, mild Uincont; he didn't want any intervention & no changes made to his regimen...  CVA (ICD-434.91) - Stroke in 1990 in the setting of HBP...  DEGENERATIVE JOINT DISEASE, GENERALIZED (ICD-715.00) - he uses OTC pain meds & TRAMADOL 50mg  Prn...  GOUT (ICD-274.9) - on ALLOPURINOL 100mg /d,  & now off the Colchicine... ~  labs 10/08 showed Uric= 10.3 ~  labs 12/09 showed Uric= 7.7.Marland Kitchen. he wants to continue same meds. ~  labs 12/11 showed Uric = 6.4  INGUINAL HERNIA (ICD-550.90)  Health Maintenance - he receives the yearly seasonal Flu vaccines... f/u PNEUMOVAX at his request 2010...   Past Surgical History  Procedure Laterality Date  . Appendectomy    . Inguinal hernia repair    . Cataract extraction    . Cosmetic eye surgery      Outpatient Encounter Prescriptions as of 07/15/2013  Medication Sig  . allopurinol (ZYLOPRIM)  100 MG tablet TAKE 1 TABLET BY MOUTH DAILY  . atorvastatin (LIPITOR) 20 MG tablet Take 1 tablet (20 mg total) by mouth daily.  . bimatoprost (LUMIGAN) 0.03 % ophthalmic solution Place 1 drop into both eyes at bedtime.    . brinzolamide (AZOPT) 1 % ophthalmic suspension Place 1 drop into both eyes 2 (two) times daily.    . Cholecalciferol (VITAMIN D) 2000 UNITS CAPS Take 1 capsule by mouth daily.    . cloNIDine (CATAPRES) 0.2 MG tablet TAKE 1 TABLET BY MOUTH TWICE DAILY  . eplerenone (INSPRA) 25 MG tablet TAKE 1 TABLET BY MOUTH EVERY DAY  . meclizine (ANTIVERT) 25 MG tablet Take 1 tablet (25 mg total) by mouth 3 (three) times daily as needed.  . metoprolol succinate (TOPROL-XL) 100 MG 24 hr tablet TAKE 1 TABLET BY MOUTH DAILY AS DIRECTED  . Multiple Vitamins-Minerals (ICAPS) TABS Take 1 tablet by mouth 2 (two) times daily.    . pilocarpine (PILOCAR) 4 % ophthalmic solution 1 drop into left eye every 4 hrs as needed  . potassium chloride SA (K-DUR,KLOR-CON) 20 MEQ tablet TAKE 3 TABLETS BY MOUTH DAILY  . Tamsulosin HCl (FLOMAX) 0.4 MG CAPS Take 0.4 mg by mouth daily.    Marland Kitchen telmisartan (MICARDIS) 80 MG tablet TAKE 1 TABLET BY MOUTH EVERY DAY  . torsemide (DEMADEX) 20 MG tablet TAKE 2 TABLETS BY MOUTH AM AND 1 BY MOUTH EVERY EVENING  . verapamil (CALAN-SR) 120 MG CR tablet Take 1 tablet (120 mg total) by mouth at bedtime.  Marland Kitchen warfarin (COUMADIN) 5 MG tablet Take as directed by coumadin clinic  . [DISCONTINUED] eplerenone (INSPRA) 25 MG tablet TAKE 1 TABLET BY MOUTH EVERY DAY  . [DISCONTINUED] metoprolol succinate (TOPROL-XL) 100 MG 24 hr tablet Take 100 mg by mouth daily.  . [DISCONTINUED] torsemide (DEMADEX) 20 MG tablet TAKE 2 TABLETS BY MOUTH EVERY MORNING AND 1 TABLET BY MOUTH EVERY EVENING    Allergies  Allergen Reactions  . Ibuprofen     REACTION: causes low blood pressure    Current Medications, Allergies, Past Medical History, Past Surgical History, Family History, and Social History  were reviewed in [employer] record.    Review of Systems        See HPI - all other systems neg except as noted... The patient complains of decreased hearing, dyspnea on exertion, peripheral edema, and difficulty walking.  The patient denies anorexia, fever, weight loss, weight gain, vision loss, hoarseness, chest pain, syncope, prolonged cough, headaches, hemoptysis, abdominal pain, melena, hematochezia, severe indigestion/heartburn, hematuria, incontinence, muscle weakness, suspicious skin lesions, transient blindness, depression, unusual weight change, abnormal bleeding, enlarged lymph nodes, and angioedema.   Objective:   Physical Exam     WD, WN, 79 y/o BM in NAD... GENERAL:  Alert & oriented; pleasant & cooperative... HEENT:  [place]/AT, EOM-wnl, PERRLA, EACs-clear, TMs-wnl, NOSE-clear, THROAT-clear & wnl. NECK:  Supple w/ fairROM; no JVD; sl decr carotid impulses w/ transmit murmur; no thyromegaly or nodules palpated; no lymphadenopathy. CHEST:  Clear to P & A; without wheezes/ rales/ or rhonchi heard.Marland KitchenMarland Kitchen  HEART:  sl irreg, gr 2/6 AS murmur at base, without rubs or gallops apprec... ABDOMEN:  Soft & nontender; normal bowel sounds; no organomegaly or masses detected. EXT: without deformities, mild arthritic changes; no varicose veins/ +venous insuffic/ tr edema. NEURO:  CN's intact; no focal neuro deficits... DERM:  No lesions noted; no rash etc...  RADIOLOGY DATA:  Reviewed in the EPIC EMR & discussed w/ the patient...  LABORATORY DATA:  Reviewed in the EPIC EMR & discussed w/ the patient...   Assessment & Plan:    HBP>  Adeq control on his extensive med regimen; we reviewed meds w/ family- they need to supervise & be sure he takes everything regularly...  CHF>  Stable on current meds; renal w/ Creat=1.5...  AS>  Mod AS on 2DEcho & followed by DrHochrein (pt has declined f/u 2DEcho)  AFib>  He remains on Coumadin followed in the CC...  PA art aneurysm &  Pulm HTN>  Stable on current med regimen & BP control...  CHOL>  FLP looks good on Lip20 when last checked 6/14...  Renal Insuffic>  Creat improved to 1.5-1.6 & stable on current meds...  DJD/ Gout>  Stable as well, c/o some rib pain as noted; we discussed rest/ heat/ rib binder & Tramadol...  Other medical problems as noted...   Patient's Medications  New Prescriptions   No medications on file  Previous Medications   ATORVASTATIN (LIPITOR) 20 MG TABLET    Take 1 tablet (20 mg total) by mouth daily.   BIMATOPROST (LUMIGAN) 0.03 % OPHTHALMIC SOLUTION    Place 1 drop into both eyes at bedtime.     BRINZOLAMIDE (AZOPT) 1 % OPHTHALMIC SUSPENSION    Place 1 drop into both eyes 2 (two) times daily.     CHOLECALCIFEROL (VITAMIN D) 2000 UNITS CAPS    Take 1 capsule by mouth daily.     CLONIDINE (CATAPRES) 0.2 MG TABLET    TAKE 1 TABLET BY MOUTH TWICE DAILY   EPLERENONE (INSPRA) 25 MG TABLET    TAKE 1 TABLET BY MOUTH EVERY DAY   MECLIZINE (ANTIVERT) 25 MG TABLET    Take 1 tablet (25 mg total) by mouth 3 (three) times daily as needed.   METOPROLOL SUCCINATE (TOPROL-XL) 100 MG 24 HR TABLET    TAKE 1 TABLET BY MOUTH DAILY AS DIRECTED   MULTIPLE VITAMINS-MINERALS (ICAPS) TABS    Take 1 tablet by mouth 2 (two) times daily.     PILOCARPINE (PILOCAR) 4 % OPHTHALMIC SOLUTION    1 drop into left eye every 4 hrs as needed   POTASSIUM CHLORIDE SA (K-DUR,KLOR-CON) 20 MEQ TABLET    TAKE 3 TABLETS BY MOUTH DAILY   TAMSULOSIN HCL (FLOMAX) 0.4 MG CAPS    Take 0.4 mg by mouth daily.     TELMISARTAN (MICARDIS) 80 MG TABLET    TAKE 1 TABLET BY MOUTH EVERY DAY   VERAPAMIL (CALAN-SR) 120 MG CR TABLET    Take 1 tablet (120 mg total) by mouth at bedtime.   WARFARIN (COUMADIN) 5 MG TABLET    Take as directed by coumadin clinic  Modified Medications   Modified Medication Previous Medication   ALLOPURINOL (ZYLOPRIM) 100 MG TABLET allopurinol (ZYLOPRIM) 100 MG tablet      TAKE 1 TABLET BY MOUTH DAILY    TAKE 1 TABLET  BY MOUTH DAILY   TORSEMIDE (DEMADEX) 20 MG TABLET torsemide (DEMADEX) 20 MG tablet      TAKE 2 TABLETS BY MOUTH AM AND 1  BY MOUTH EVERY EVENING    TAKE 2 TABLETS BY MOUTH AM AND 1 BY MOUTH EVERY EVENING  Discontinued Medications   EPLERENONE (INSPRA) 25 MG TABLET    TAKE 1 TABLET BY MOUTH EVERY DAY   METOPROLOL SUCCINATE (TOPROL-XL) 100 MG 24 HR TABLET    Take 100 mg by mouth daily.   TORSEMIDE (DEMADEX) 20 MG TABLET    TAKE 2 TABLETS BY MOUTH EVERY MORNING AND 1 TABLET BY MOUTH EVERY EVENING

## 2013-07-15 NOTE — Patient Instructions (Signed)
Today we updated your med list in our EPIC system...    Continue your current medications the same...  Call for any questions...  Let's plan a follow up visit in 55mo with CXR & FASTING blood work at that time.Marland KitchenMarland Kitchen

## 2013-08-04 ENCOUNTER — Telehealth: Payer: Self-pay | Admitting: Pulmonary Disease

## 2013-08-04 DIAGNOSIS — M545 Low back pain: Secondary | ICD-10-CM

## 2013-08-04 MED ORDER — HYDROCODONE-ACETAMINOPHEN 5-325 MG PO TABS: 1.0000 | ORAL_TABLET | Freq: Three times a day (TID) | ORAL | Status: DC | PRN

## 2013-08-04 NOTE — Telephone Encounter (Signed)
I spoke with spouse. She reports pt is having lower back pain x 2 weeks getting worse. The pain lasts all day but does ease up some. The pain at times goes down into his hip. She doesn't;t believe he did anything to hurt his back. Does not have an ortho doc. Took tylenol-didn;t help. Changed to aleve and it did help slightly. Please advise SN thanks  Allergies  Allergen Reactions  . Ibuprofen     REACTION: causes low blood pressure     Current Outpatient Prescriptions on File Prior to Visit  Medication Sig Dispense Refill  . allopurinol (ZYLOPRIM) 100 MG tablet TAKE 1 TABLET BY MOUTH DAILY  30 tablet  6  . atorvastatin (LIPITOR) 20 MG tablet Take 1 tablet (20 mg total) by mouth daily.  30 tablet  10  . bimatoprost (LUMIGAN) 0.03 % ophthalmic solution Place 1 drop into both eyes at bedtime.        . brinzolamide (AZOPT) 1 % ophthalmic suspension Place 1 drop into both eyes 2 (two) times daily.        . Cholecalciferol (VITAMIN D) 2000 UNITS CAPS Take 1 capsule by mouth daily.        . cloNIDine (CATAPRES) 0.2 MG tablet TAKE 1 TABLET BY MOUTH TWICE DAILY  60 tablet  6  . eplerenone (INSPRA) 25 MG tablet TAKE 1 TABLET BY MOUTH EVERY DAY  30 tablet  6  . meclizine (ANTIVERT) 25 MG tablet Take 1 tablet (25 mg total) by mouth 3 (three) times daily as needed.  50 tablet  5  . metoprolol succinate (TOPROL-XL) 100 MG 24 hr tablet TAKE 1 TABLET BY MOUTH DAILY AS DIRECTED  30 tablet  6  . Multiple Vitamins-Minerals (ICAPS) TABS Take 1 tablet by mouth 2 (two) times daily.        . pilocarpine (PILOCAR) 4 % ophthalmic solution 1 drop into left eye every 4 hrs as needed      . potassium chloride SA (K-DUR,KLOR-CON) 20 MEQ tablet TAKE 3 TABLETS BY MOUTH DAILY  270 tablet  0  . Tamsulosin HCl (FLOMAX) 0.4 MG CAPS Take 0.4 mg by mouth daily.        Marland Kitchen telmisartan (MICARDIS) 80 MG tablet TAKE 1 TABLET BY MOUTH EVERY DAY  30 tablet  6  . torsemide (DEMADEX) 20 MG tablet TAKE 2 TABLETS BY MOUTH AM AND 1 BY MOUTH  EVERY EVENING  90 tablet  6  . verapamil (CALAN-SR) 120 MG CR tablet Take 1 tablet (120 mg total) by mouth at bedtime.  30 tablet  3  . warfarin (COUMADIN) 5 MG tablet Take as directed by coumadin clinic  90 tablet  1   No current facility-administered medications on file prior to visit.

## 2013-08-04 NOTE — Telephone Encounter (Signed)
Per SN---  1.  Continue the aleve 2.  Rest and heat to the area 3.  rx vicodin #90  TID prn 4.  Refer to ortho---Dr. Ophelia Charter for eval

## 2013-08-04 NOTE — Telephone Encounter (Signed)
I called and spoke with pt daughter. Aware of recs. RX for norco printed off and daughter will pick up later. Referral also placed. Nothing further needed

## 2013-08-05 ENCOUNTER — Ambulatory Visit (INDEPENDENT_AMBULATORY_CARE_PROVIDER_SITE_OTHER): Payer: Medicare Other

## 2013-08-05 VITALS — BP 154/82 | HR 79 | Resp 12

## 2013-08-05 DIAGNOSIS — B351 Tinea unguium: Secondary | ICD-10-CM

## 2013-08-05 DIAGNOSIS — M79609 Pain in unspecified limb: Secondary | ICD-10-CM

## 2013-08-05 NOTE — Progress Notes (Signed)
   Subjective:    Patient ID: [name] [name], male    DOB: Aug 30, 1925, 79 y.o.   MRN: [id number]  HPI Comments: '' TOENAILS TRIM.''     Review of Systems deferred at this visit    Objective:   Physical Exam Vascular status is intact as follows pedal pulses palpable DP postal for PT one over 4 bilateral Refill time 3 seconds all digits neurologically epicritic and proprioceptive sensations intact and symmetric. Neurologically skin color pigment normal hair growth absent nails thick brittle friable criptotic incurvated painful and tender 1 through 5 bilateral on debridement and with enclosed shoe wear. No secondary infections at this time orthopedic exam reveals rectus foot type with digital contractures.       Assessment & Plan:  Assessment this time onychomycosis painful mycotic nails with glucose incurvation discoloration and brittleness debrided 1 through 5 bilateral return for future palliative care and as-needed basis suggest a 3 month followup  Alvan Dame DPM

## 2013-08-05 NOTE — Patient Instructions (Signed)

## 2013-08-08 ENCOUNTER — Telehealth: Payer: Self-pay | Admitting: Pulmonary Disease

## 2013-08-08 ENCOUNTER — Other Ambulatory Visit (HOSPITAL_COMMUNITY): Payer: Self-pay | Admitting: Cardiology

## 2013-08-08 NOTE — Telephone Encounter (Signed)
LMTCB

## 2013-08-08 NOTE — Telephone Encounter (Signed)
[phone] or [phone] please call back

## 2013-08-08 NOTE — Telephone Encounter (Signed)
Spoke with the pt He is c/o aches and cough x 3 days  He is coughing up minimal sputum but does not know the color  No f/c/s, HA, sore throat or other co's  He is taking tylenol for aches and I urged him to continue this as needed  He is requesting further recs  Please advise, thanks! Allergies  Allergen Reactions  . Ibuprofen     REACTION: causes low blood pressure

## 2013-08-08 NOTE — Telephone Encounter (Signed)
Per SN- increase fluids, rest, take tylenol prn, and mucinex dm otc for cough/congestion as needed Pt aware and denies any further questions

## 2013-08-11 ENCOUNTER — Encounter (HOSPITAL_COMMUNITY): Payer: Medicare Other

## 2013-08-18 ENCOUNTER — Telehealth: Payer: Self-pay | Admitting: Pulmonary Disease

## 2013-08-18 NOTE — Telephone Encounter (Signed)
Needs ov  Can see tomorrow evening on my schedule  Please contact office for sooner follow up if symptoms do not improve or worsen or seek emergency care

## 2013-08-18 NOTE — Telephone Encounter (Signed)
Per TP:  As symptoms have been going on a couple of weeks, it is ok for pt to wait a few days until next available OV.  If symptoms worsen, he will need to call office ASAP or seek emergency care.  Called, spoke with pt.  Offered OV with TP on Thursday.  Pt states he will need to have daughter call back to schedule appt.  Will await daughter's call.

## 2013-08-18 NOTE — Telephone Encounter (Signed)
Spoke with the pt daughter and appt set for Thursday at 4:30 with TP. [place] Bing, CMA

## 2013-08-18 NOTE — Telephone Encounter (Signed)
Called and spoke with pt and he stated that he still has the cold and cough with clear sputum x 10 days.  Pt is taking the tylenol, fluids and mucinex.   He stated that he is now having right side pain that is below his belly button and goes to the center of his abd and goes around into his back x  2-3 days.  Pt stated that when he gets up from lying down this pain is worse but the pain comes and goes.  Pt denied any n,v,d.   He stated that he has only been eating soups, etc.   Last ov--07/15/2013 Next ov--01/15/2014  Allergies  Allergen Reactions  . Ibuprofen     REACTION: causes low blood pressure     Current Outpatient Prescriptions on File Prior to Visit  Medication Sig Dispense Refill  . allopurinol (ZYLOPRIM) 100 MG tablet TAKE 1 TABLET BY MOUTH DAILY  30 tablet  6  . atorvastatin (LIPITOR) 20 MG tablet Take 1 tablet (20 mg total) by mouth daily.  30 tablet  10  . bimatoprost (LUMIGAN) 0.03 % ophthalmic solution Place 1 drop into both eyes at bedtime.        . brinzolamide (AZOPT) 1 % ophthalmic suspension Place 1 drop into both eyes 2 (two) times daily.        . Cholecalciferol (VITAMIN D) 2000 UNITS CAPS Take 1 capsule by mouth daily.        . cloNIDine (CATAPRES) 0.2 MG tablet TAKE 1 TABLET BY MOUTH TWICE DAILY  60 tablet  6  . eplerenone (INSPRA) 25 MG tablet TAKE 1 TABLET BY MOUTH EVERY DAY  30 tablet  6  . HYDROcodone-acetaminophen (NORCO) 5-325 MG per tablet Take 1 tablet by mouth 3 (three) times daily as needed for moderate pain.  90 tablet  0  . meclizine (ANTIVERT) 25 MG tablet Take 1 tablet (25 mg total) by mouth 3 (three) times daily as needed.  50 tablet  5  . metoprolol succinate (TOPROL-XL) 100 MG 24 hr tablet TAKE 1 TABLET BY MOUTH DAILY AS DIRECTED  30 tablet  6  . Multiple Vitamins-Minerals (ICAPS) TABS Take 1 tablet by mouth 2 (two) times daily.        . pilocarpine (PILOCAR) 4 % ophthalmic solution 1 drop into left eye every 4 hrs as needed      . potassium chloride  SA (K-DUR,KLOR-CON) 20 MEQ tablet TAKE 3 TABLETS BY MOUTH DAILY  270 tablet  0  . Tamsulosin HCl (FLOMAX) 0.4 MG CAPS Take 0.4 mg by mouth daily.        Marland Kitchen telmisartan (MICARDIS) 80 MG tablet TAKE 1 TABLET BY MOUTH EVERY DAY  30 tablet  6  . torsemide (DEMADEX) 20 MG tablet TAKE 2 TABLETS BY MOUTH AM AND 1 BY MOUTH EVERY EVENING  90 tablet  6  . verapamil (CALAN-SR) 120 MG CR tablet Take 1 tablet (120 mg total) by mouth at bedtime.  30 tablet  3  . warfarin (COUMADIN) 5 MG tablet Take as directed by coumadin clinic  90 tablet  1   No current facility-administered medications on file prior to visit.

## 2013-08-21 ENCOUNTER — Encounter: Payer: Self-pay | Admitting: Adult Health

## 2013-08-21 ENCOUNTER — Ambulatory Visit (INDEPENDENT_AMBULATORY_CARE_PROVIDER_SITE_OTHER): Payer: Medicare Other | Admitting: Adult Health

## 2013-08-21 ENCOUNTER — Other Ambulatory Visit (INDEPENDENT_AMBULATORY_CARE_PROVIDER_SITE_OTHER): Payer: Medicare Other

## 2013-08-21 ENCOUNTER — Encounter (INDEPENDENT_AMBULATORY_CARE_PROVIDER_SITE_OTHER): Payer: Self-pay

## 2013-08-21 VITALS — BP 144/82 | HR 79 | Temp 97.7°F | Ht 71.0 in | Wt 188.4 lb

## 2013-08-21 DIAGNOSIS — R300 Dysuria: Secondary | ICD-10-CM

## 2013-08-21 DIAGNOSIS — M159 Polyosteoarthritis, unspecified: Secondary | ICD-10-CM

## 2013-08-21 LAB — URINALYSIS, ROUTINE W REFLEX MICROSCOPIC
BILIRUBIN URINE: NEGATIVE
Hgb urine dipstick: NEGATIVE
Ketones, ur: NEGATIVE
Leukocytes, UA: NEGATIVE
Nitrite: NEGATIVE
Specific Gravity, Urine: 1.015 (ref 1.000–1.030)
TOTAL PROTEIN, URINE-UPE24: NEGATIVE
Urine Glucose: NEGATIVE
Urobilinogen, UA: 0.2 (ref 0.0–1.0)
WBC, UA: NONE SEEN (ref 0–?)
pH: 5 (ref 5.0–8.0)

## 2013-08-21 MED ORDER — HYDROCODONE-ACETAMINOPHEN 5-325 MG PO TABS: 1.0000 | ORAL_TABLET | Freq: Two times a day (BID) | ORAL | Status: DC | PRN

## 2013-08-21 NOTE — Progress Notes (Signed)
Subjective:    Patient ID: [name] [name], male    DOB: [birth year], 80 y.o.   MRN: [id number]  HPI 80 y/o BM - he has multiple medical problems including HBP, CAD, AS, AFib, & CHF followed by DrHochrein & the CHFclinic;  known PulmHTN & PA aneurysm conservatively managed by DrGearhardt;  Hyperchol;  RI & BPH;  DJD & Gout...  ~  July 15, 2013:  43mo ROV & [name] [name] is stable, here w/ his daighter who confirms; as noted he is blind in left eye w/ poor vision on right as well (can't read or drive, can see some TV from a distance), hx Glaucoma Rx by DrBond; he has not been exercising much- has a [employer] fast streamer named [name] & I suggested he get out & walk the dog w/ family help...  He has some minor somatic complaints- notes "ridges, wrinkles" on his scalp that seem to come & go, notes mild arthritis pain in back, VI/ edema in legs; we offered reassurance, discussed Aleve, Tylenol, no salt, elevation, support hose & continue current meds...    HBP> on MetoprololER100, CalanSR120, Micardis80, Clonidine0.2Bid, Demadex20-3/d, Epleronone25, K20-3/d; BP=132/78 & he denies CP, palpit, dizzy, ch in SOB/DOE, edema...    CHF> followed by DrHochrein for Cards- w/ severe Diastolic CHF, normal C's by cath in 2005, & normal LV systolic function w/ [id number]; seen 1/14- stable, notes DOE- no change, pt refused f/u 2DEcho, rec conservative Rx...    AS> w/ severely calcif AoV leaflets & mod AS w/ 32mm mean gradient (peak=39); last 2DEcho 10/12- pt desired conservative Rx, DrHochrein's notes are reviewed...    AFib> on Coumadin via [facility]; stable AFib on Coumadin w/ rate control from [place]...    Pulm Art aneurysm & PulmHTN> O2sat=98% on RA at rest today & he has home O2 but just uses it "prn";  3cm PA aneurysm followed by DrGearhardt without change x yrs...    CHOL> on Lip20; last FLP 6/14 showed TChol 150, TG 89, HDL 51, LDL 81... Continue same rx...    Renal Insuffic, BPH> on Flomax0.4; baseline Creat= 1.4-1.6 range;  followed by DrTannenbaum & managed conservatively, last seen 10/13- note reviewed...    DJD, Gout> on Allopurinol100, VitD2000, Aleve/ Tylenol; stable w/o specific complaints at present...   08/21/2013 Acute OV  Complains of 3-4 days of supra-pubic pressure with radiating into low back . Stiff in hips on/off.  Did have some gas and bloating that was relieved with gas-x.  No fever, chest pain, orthopnea  UA today  Neg.  Says his low back is stiff on/off . No known injury . No leg weakness . No radiating pain Appetite is okay w/ no bloody stools or n/v. No relation to food intake. No dysphagia .  Hx of appendectomy.           Problem List:  OPHTHALMOLOGY - s/p cataract surg & followed by DrBond for worsening Glaucoma & poor vision on gtts daily...  PULMONARY HYPERTENSION (ICD-416.8) - on home oxygen 2L/min "Prn"... ~  Prev 2DEcho w/ est PAsys= 64... he has not had a right heart cath... ~  Repeat 2DEcho 10/12 showed mod LVH, norm sys function w/ EF=55-60% & norm wall motion, Gr2DD, severely calcif AoV leaflets w/ mod AS & 36mm mean gradient (peak=39), mild MR, severe LAdil & RAdil, RV sys function normal, modTR, PAsys=43... ~  6/13:  O2sat today is 95% on RA at rest;  CXR 6/13 showed mod cardiomeg unchanged, ectasia & calcif in Ao,  enlarged right pulm art (no change), elev left hemidiaph w/ bibasilar atx, NAD... ~  6/14:  O2sat today is 94% on RA at rest;  CXR 6/14 showed borderline cardiomeg, extremely tort Ao, PA dilatation is the same (no change), elev left hemidiaph, scarring at bases, NAD...  HYPERTENSION (ICD-401.9) -  ~  on METOPROLOL ER 100mg - 1/2 tab daily,  VERAPAMIL 240mg /d,  MICARDIS 80mg /d,  CLONIDINE 0.2mg - 1/2 Bid,  DEMADEX 20mg - 2am & 1pm,  EPLERENONE 25mg /d, & K20- 3/d; he was prev on Minoxidil  but this discontinued by Cardiology... ~  6/10: DrHochrein added back the Clonidine & asked him, again, to monitor BP at home. ~  12/10:  they requested trial to decr K20's from 6/d  required due to his Demedex... suggest adding ALDACTONE 25mg /d & decr K20 to 4/d w/ f/u labs... ~  6/11:  Aldactone caused gynecomastia & was switched to EPLERENONE 25mg /d by [name] w/ some improvment in side effect. ~  6/12:  BP controlled & labs sl improved w/ the Demadex decr to 3/d... ~  12/12:  BP= 126/72 today and not checking at home, tolerating meds well; denies HA, visual changes, CP, palipit, dizziness, syncope; he has chr DOE without change and mild edema... ~  6/13:  BP= 120/72 & he remains stable w/o CP, palpit, +SOB w/o change, tr edema... ~  12/13: on MetoprololER100-1/2 daily, CalanSR120, Micardis80, Clonidine0.2-1/2Bid, Demadex20-3/d, Epleronone25, K20-3/d; BP=144/92 & he has sl HA; we decided to incr his Metopto 100mg /d & the Clonidine0.2 to 1 tab Bid. ~  6/14: on MetoprololER100, CalanSR120, Micardis80, Clonidine0.2Bid, Demadex20-3/d, Epleronone25, K20-3/d; BP=130/80 & he is asked to monitor BP at home more closely; he denies CP, palpit, dizzy, ch in SOB/DOE, edema. ~  12/14: on MetoprololER100, CalanSR120, Micardis80, Clonidine0.2Bid, Demadex20-3/d, Epleronone25, K20-3/d; BP=132/78 & he denies CP, palpit, dizzy, ch in SOB/DOE, edema.  CHF (ICD-428.0) - see meds above... followed in the [facility]F Clinic by DrHochrein;  he has severe Diastolic CHF, w/ normal C's by cath in 2005, & normal LV systolic function w/ WU=98%... notes DOE- no change. ~  labs 6/09 showed BNP= 761 ~  labs 9/09 showed BNP= 486 ~  labs 12/09 showed BNP = 568 ~  labs 6/10 showed BNP= 421 ~  labs 12/10 showed BNP= 577... adding SPIRONOLACTONE 25mg /d. ~  labs 1/11 showed BNP= 395... later ch to EPLERENONE 25mg /d. ~  labs 12/11 showed BNP= 535 ~  Labs 6/12 showed BNP= 524 (on Demedex3/d + Eplerenone25mg /d). ~  Labs 12/12 showed BNP= 371 ~  4/13:  He had f/u DrHochrein> Mod AS, chr AFib, DD, etc; stable, no progressive symptoms & pt preferred conserv Rx; they discussed poss TAVR in future... ~  Labs 6/13 showed  BNP= 381 (note: BUN=30 Creat=1.5) ~  1/14:  He had f/u DrHochrein> mult cardiovasc problems, doing well, no CP etc; rec salt & fluid restriction, pt declined f/u 2DEcho, on Coumadin & rate control, continue conservative management... ~  Stable on diuretic therapy, no changes made...  AORTIC STENOSIS (ICD-424.1) - moderate by 2DEcho & followed by DrHochrein... ~  repeat 2DEcho 1/09 hosp = similiar mod AS... ~  Repeat 2DEcho 1/12 showed mild LVH, norm sys function w/ EF=55-60% & no regional wall motion abn, mod AS, dilated LA&RA, modTR, PAsys=50... ~  Repeat 2DEcho 10/12 showed mod LVH, norm sys function w/ EF=55-60% & norm wall motion, Gr2DD, severely calcif AoV leaflets w/ mod AS & 29mm mean gradient (peak=39), mild MR, severe LAdil & RAdil, RV sys function normal, modTR,  PAsys=43...  ATRIAL FIBRILLATION (ICD-427.31) - on COUMADIN & followed in the Coumadin Clinic. ~  EKG 4/13 showed AFib, rate82, LAD, NSSTTWA...  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 ~  CXR 1/09 showed unchanged right PA aneurysm, stable cardiomeg, elevated left hemidiaph w/ atx. ~  CXR 6/10 showed no change, Ao tortuous and calcif, NAD. ~  CXR 12/11 showed stable elv left hemidiaph, bibasilar atx/ scarring, cardiomeg, ectatic ao, right PA enlargement. ~  CXR 6/13 showed mod cardiomeg unchanged, ectasia & calcif in Ao, enlarged right pulm art (no change), elev left hemidiaph w/ bibasilar atx, NAD.Marland Kitchen. ~  CXR 6/14 showed borderline cardiomeg, tortAo, PA dilatation is the same (no change), elev left hemidiaph, scarring at bases, NAD.  ASPVD >> on Coumadin for his AFib... ~  DrHochrein ordered CDopplers 12/13> mild heterogeneous plaque on right & mixed irreg plaque on left, 0-39% bilat ICA stenoses & antegrade vertebral flow; f/u 73yr...  HYPERCHOLESTEROLEMIA (ICD-272.0) - now on LIPITOR 20mg /d (prev Simva40 changed due to concomitant Verap). ~  [place] 1/09 on Simva40  showed TChol 95, TG 39, HDL 36, LDL 51 ~  FLP 12/09 on Simva40 showed TChol 167, TG 68, HDL 64, LDL 90 ~  FLP 1/11 on Simva40 showed TChol 153, TG 101, HDL 50, LDL 83... later ch to [place]. ~  [place] 6/12 on Lip20 showed TChol 162, TG 95, HDL 46, LDL 97 ~  6/13: not fasting for FLP today... ~  6/14: FLP on Lip20 showed TChol 150, TG 89, HDL 51, LDL 81...  HEMORRHOIDS (ICD-455.6)  RENAL INSUFFICIENCY (ICD-588.9)  ~  Creat=1.4 - 1.6 during 1/09 hosp... ~  labs 6/09 & 9/09 showed Creat= 1.3 ~  labs 12/09 showed BUN= 20, Creat= 1.3, K= 4.0 ~  labs 6/10 showed BUN= 24, Creat= 1.3, K= 3.8 ~  labs 12/10 showed BUN= 12, Creat= 1.2, K= 3.6 (on 6 K20/d) ~  labs 4/11 showed BUN= 18, Creat= 1.4, K= 4.0 (on 4 KCl/d) ~  labs 12/11 showed BUN= 32, Creat= 1.8, K= 4.3 ~  Labs 6/12 showed BUN= 27, creat= 1.6, K= 4.1 ~  Labs 12/12 showed BUN=21, Creat=1.6, K=3.9 ~  Labs 6/13 showed BUN= 30, Creat= 1.5, K= 4.5 ~  Labs 6/14 showed BUN= 26, Cr= 1.5, K= 3.5 (encouraged to take the KCl 90mEq- 3/d)...  BENIGN PROSTATIC HYPERTROPHY, HX OF (ICD-V13.8) - followed by DrTannenbaum & taking FLOMAX 0.4mg /d... last seen 5/09 and his note is reviewed... ~  labs 12/09 showed PSA= 1.42 ~  labs 2/11 showed PSA= 1.65 ~  Labs 6/13 showed PSA= 1.58 ~  10/13: he had Urology f/u DrTannenbaum> BPH w/ BOO, nocturia, mild Uincont; he didn't want any intervention & no changes made to his regimen...  CVA (ICD-434.91) - Stroke in 1990 in the setting of HBP...  DEGENERATIVE JOINT DISEASE, GENERALIZED (ICD-715.00) - he uses OTC pain meds & TRAMADOL 50mg  Prn...  GOUT (ICD-274.9) - on ALLOPURINOL 100mg /d,  & now off the Colchicine... ~  labs 10/08 showed Uric= 10.3 ~  labs 12/09 showed Uric= 7.7.Marland Kitchen. he wants to continue same meds. ~  labs 12/11 showed Uric = 6.4  INGUINAL HERNIA (ICD-550.90)  Health Maintenance - he receives the yearly seasonal Flu vaccines... f/u PNEUMOVAX at his request 2010...   Past Surgical History   Procedure Laterality Date  . Appendectomy    . Inguinal hernia repair    . Cataract extraction    . Cosmetic eye surgery  Outpatient Encounter Prescriptions as of 08/21/2013  Medication Sig  . allopurinol (ZYLOPRIM) 100 MG tablet TAKE 1 TABLET BY MOUTH DAILY  . atorvastatin (LIPITOR) 20 MG tablet Take 1 tablet (20 mg total) by mouth daily.  . bimatoprost (LUMIGAN) 0.03 % ophthalmic solution Place 1 drop into both eyes at bedtime.    . brinzolamide (AZOPT) 1 % ophthalmic suspension Place 1 drop into both eyes 2 (two) times daily.    . Cholecalciferol (VITAMIN D) 2000 UNITS CAPS Take 1 capsule by mouth daily.    . cloNIDine (CATAPRES) 0.2 MG tablet TAKE 1 TABLET BY MOUTH TWICE DAILY  . eplerenone (INSPRA) 25 MG tablet TAKE 1 TABLET BY MOUTH EVERY DAY  . meclizine (ANTIVERT) 25 MG tablet Take 1 tablet (25 mg total) by mouth 3 (three) times daily as needed.  . metoprolol succinate (TOPROL-XL) 100 MG 24 hr tablet TAKE 1 TABLET BY MOUTH DAILY AS DIRECTED  . Multiple Vitamins-Minerals (ICAPS) TABS Take 1 tablet by mouth 2 (two) times daily.    . pilocarpine (PILOCAR) 4 % ophthalmic solution 1 drop into left eye every 4 hrs as needed  . potassium chloride SA (K-DUR,KLOR-CON) 20 MEQ tablet TAKE 3 TABLETS BY MOUTH DAILY  . Tamsulosin HCl (FLOMAX) 0.4 MG CAPS Take 0.4 mg by mouth daily.    Marland Kitchen telmisartan (MICARDIS) 80 MG tablet TAKE 1 TABLET BY MOUTH EVERY DAY  . torsemide (DEMADEX) 20 MG tablet TAKE 2 TABLETS BY MOUTH AM AND 1 BY MOUTH EVERY EVENING  . verapamil (CALAN-SR) 120 MG CR tablet Take 1 tablet (120 mg total) by mouth at bedtime.  Marland Kitchen warfarin (COUMADIN) 5 MG tablet Take as directed by coumadin clinic  . [DISCONTINUED] HYDROcodone-acetaminophen (NORCO) 5-325 MG per tablet Take 1 tablet by mouth 3 (three) times daily as needed for moderate pain.    Allergies  Allergen Reactions  . Ibuprofen     REACTION: causes low blood pressure    Current Medications, Allergies, Past Medical  History, Past Surgical History, Family History, and Social History were reviewed in [employer] record.    Review of Systems        See HPI - all other systems neg except as noted...   The patient denies anorexia, fever, weight loss, weight gain, vision loss, hoarseness, chest pain, syncope, prolonged cough, headaches, hemoptysis, melena, hematochezia, severe indigestion/heartburn, hematuria, incontinence, muscle weakness, suspicious skin lesions, transient blindness, depression, unusual weight change, abnormal bleeding, enlarged lymph nodes, and angioedema.   Objective:   Physical Exam     WD, WN, 80 y/o BM in NAD... GENERAL:  Alert & oriented; pleasant & cooperative... HEENT:  [place]/AT, EOM-wnl, PERRLA, EACs-clear, TMs-wnl, NOSE-clear, THROAT-clear & wnl. NECK:  Supple w/ fairROM; no JVD;   no thyromegaly or nodules palpated; no lymphadenopathy. CHEST:  Clear to P & A; without wheezes/ rales/ or rhonchi heard... HEART:  sl irreg, gr 2/6 AS murmur at base, without rubs or gallops apprec... ABDOMEN:  Soft & nontender; normal bowel sounds; no organomegaly or masses detected.no guarding or rebound.  EXT: without deformities, mild arthritic changes; no varicose veins/ +venous insuffic/ tr edema.tender along low back, no deformity noted, decreased ROM of hips, nml equal strength of LE. Neg SLR  NEURO:  CN's intact; no focal neuro deficits... DERM:  No lesions noted; no rash etc...    Assessment & Plan:

## 2013-08-21 NOTE — Patient Instructions (Addendum)
Alternate ice and heat to your low back .  Tylenol As needed  Pain.  Vicodin 1/2 -1 tab Twice daily  As needed  Pain, may make you sleepy.  Return tomorrow for labs as planned  If not improving will need referral to orthopedics.  Please contact office for sooner follow up if symptoms do not improve or worsen or seek emergency care

## 2013-08-22 ENCOUNTER — Ambulatory Visit (INDEPENDENT_AMBULATORY_CARE_PROVIDER_SITE_OTHER): Payer: Medicare Other

## 2013-08-22 ENCOUNTER — Other Ambulatory Visit (INDEPENDENT_AMBULATORY_CARE_PROVIDER_SITE_OTHER): Payer: Medicare Other

## 2013-08-22 DIAGNOSIS — I4891 Unspecified atrial fibrillation: Secondary | ICD-10-CM

## 2013-08-22 DIAGNOSIS — I6350 Cerebral infarction due to unspecified occlusion or stenosis of unspecified cerebral artery: Secondary | ICD-10-CM

## 2013-08-22 DIAGNOSIS — R300 Dysuria: Secondary | ICD-10-CM

## 2013-08-22 DIAGNOSIS — Z7901 Long term (current) use of anticoagulants: Secondary | ICD-10-CM

## 2013-08-22 LAB — BASIC METABOLIC PANEL
BUN: 21 mg/dL (ref 6–23)
CHLORIDE: 105 meq/L (ref 96–112)
CO2: 29 mEq/L (ref 19–32)
Calcium: 9.2 mg/dL (ref 8.4–10.5)
Creatinine, Ser: 1.5 mg/dL (ref 0.4–1.5)
GFR: 55.36 mL/min — AB (ref >60.00)
Glucose, Bld: 99 mg/dL (ref 70–99)
Potassium: 3.9 mEq/L (ref 3.5–5.1)
SODIUM: 141 meq/L (ref 135–145)

## 2013-08-22 LAB — CBC WITH DIFFERENTIAL/PLATELET
BASOS ABS: 0 10*3/uL (ref 0.0–0.1)
Basophils Relative: 0.6 % (ref 0.0–3.0)
EOS ABS: 0.2 10*3/uL (ref 0.0–0.7)
Eosinophils Relative: 2.7 % (ref 0.0–5.0)
HCT: 40.5 % (ref 39.0–52.0)
Hemoglobin: 13 g/dL (ref 13.0–17.0)
LYMPHS PCT: 20.5 % (ref 12.0–46.0)
Lymphs Abs: 1.4 10*3/uL (ref 0.7–4.0)
MCHC: 32 g/dL (ref 30.0–36.0)
MCV: 96.6 fl (ref 78.0–100.0)
MONO ABS: 0.7 10*3/uL (ref 0.1–1.0)
Monocytes Relative: 10 % (ref 3.0–12.0)
NEUTROS PCT: 66.2 % (ref 43.0–77.0)
Neutro Abs: 4.6 10*3/uL (ref 1.4–7.7)
PLATELETS: 303 10*3/uL (ref 150.0–400.0)
RBC: 4.19 Mil/uL — ABNORMAL LOW (ref 4.22–5.81)
RDW: 16.1 % — ABNORMAL HIGH (ref 11.5–14.6)
WBC: 7 10*3/uL (ref 4.5–10.5)

## 2013-08-22 LAB — HEPATIC FUNCTION PANEL
ALK PHOS: 98 U/L (ref 39–117)
ALT: 11 U/L (ref 0–53)
AST: 14 U/L (ref 0–37)
Albumin: 3.6 g/dL (ref 3.5–5.2)
BILIRUBIN DIRECT: 0.1 mg/dL (ref 0.0–0.3)
BILIRUBIN TOTAL: 0.6 mg/dL (ref 0.3–1.2)
Total Protein: 7.2 g/dL (ref 6.0–8.3)

## 2013-08-22 LAB — URINE CULTURE
COLONY COUNT: NO GROWTH
Organism ID, Bacteria: NO GROWTH

## 2013-08-22 LAB — POCT INR: INR: 3.8

## 2013-08-26 ENCOUNTER — Other Ambulatory Visit: Payer: Self-pay | Admitting: *Deleted

## 2013-08-26 MED ORDER — WARFARIN SODIUM 5 MG PO TABS: ORAL_TABLET | ORAL | Status: DC

## 2013-08-28 NOTE — Assessment & Plan Note (Addendum)
Low back /hip pain ? DJD  Exam unrevealing  UA clear , cx pending  Check labs to r/o occult etiology   Plan  Alternate ice and heat to your low back .  Tylenol As needed  Pain.  Vicodin 1/2 -1 tab Twice daily  As needed  Pain, may make you sleepy.  Return tomorrow for labs as planned  If not improving will need referral to orthopedics.  Please contact office for sooner follow up if symptoms do not improve or worsen or seek emergency care

## 2013-09-01 ENCOUNTER — Encounter: Payer: Self-pay | Admitting: Cardiology

## 2013-09-01 ENCOUNTER — Ambulatory Visit (HOSPITAL_COMMUNITY): Payer: Medicare Other | Attending: Cardiology

## 2013-09-01 ENCOUNTER — Encounter (HOSPITAL_COMMUNITY): Payer: Medicare Other

## 2013-09-01 DIAGNOSIS — I658 Occlusion and stenosis of other precerebral arteries: Secondary | ICD-10-CM | POA: Insufficient documentation

## 2013-09-01 DIAGNOSIS — Z87891 Personal history of nicotine dependence: Secondary | ICD-10-CM | POA: Insufficient documentation

## 2013-09-01 DIAGNOSIS — E785 Hyperlipidemia, unspecified: Secondary | ICD-10-CM | POA: Insufficient documentation

## 2013-09-01 DIAGNOSIS — I10 Essential (primary) hypertension: Secondary | ICD-10-CM | POA: Insufficient documentation

## 2013-09-01 DIAGNOSIS — Z8673 Personal history of transient ischemic attack (TIA), and cerebral infarction without residual deficits: Secondary | ICD-10-CM | POA: Insufficient documentation

## 2013-09-01 DIAGNOSIS — I6529 Occlusion and stenosis of unspecified carotid artery: Secondary | ICD-10-CM

## 2013-09-08 ENCOUNTER — Other Ambulatory Visit: Payer: Self-pay | Admitting: Cardiology

## 2013-09-11 ENCOUNTER — Encounter: Payer: Self-pay | Admitting: Cardiology

## 2013-09-11 ENCOUNTER — Ambulatory Visit (INDEPENDENT_AMBULATORY_CARE_PROVIDER_SITE_OTHER): Payer: Medicare Other

## 2013-09-11 ENCOUNTER — Ambulatory Visit (INDEPENDENT_AMBULATORY_CARE_PROVIDER_SITE_OTHER): Payer: Medicare Other | Admitting: Cardiology

## 2013-09-11 VITALS — BP 140/0 | HR 91 | Ht 71.0 in | Wt 192.0 lb

## 2013-09-11 DIAGNOSIS — I4891 Unspecified atrial fibrillation: Secondary | ICD-10-CM

## 2013-09-11 DIAGNOSIS — Z7901 Long term (current) use of anticoagulants: Secondary | ICD-10-CM

## 2013-09-11 DIAGNOSIS — I6350 Cerebral infarction due to unspecified occlusion or stenosis of unspecified cerebral artery: Secondary | ICD-10-CM

## 2013-09-11 LAB — POCT INR: INR: 3.6

## 2013-09-11 NOTE — Progress Notes (Signed)
HPI The patient presents as followup of his multiple cardiovascular problems. He has been doing relatively well.  He denies any acute symptoms such as chest pressure, neck or arm discomfort. He rarely feels palpitations. He has had no presyncope or syncope. He denies any new chest pressure, neck or arm discomfort. He gets around with a cane but is relatively sedentary.    Allergies  Allergen Reactions  . Ibuprofen     REACTION: causes low blood pressure    Current Outpatient Prescriptions  Medication Sig Dispense Refill  . allopurinol (ZYLOPRIM) 100 MG tablet TAKE 1 TABLET BY MOUTH DAILY  30 tablet  6  . atorvastatin (LIPITOR) 20 MG tablet Take 1 tablet (20 mg total) by mouth daily.  30 tablet  10  . bimatoprost (LUMIGAN) 0.03 % ophthalmic solution Place 1 drop into both eyes at bedtime.        . brinzolamide (AZOPT) 1 % ophthalmic suspension Place 1 drop into both eyes 2 (two) times daily.        . Cholecalciferol (VITAMIN D) 2000 UNITS CAPS Take 1 capsule by mouth daily.        . cloNIDine (CATAPRES) 0.2 MG tablet TAKE 1 TABLET BY MOUTH TWICE DAILY  60 tablet  6  . eplerenone (INSPRA) 25 MG tablet TAKE 1 TABLET BY MOUTH EVERY DAY  30 tablet  6  . meclizine (ANTIVERT) 25 MG tablet Take 1 tablet (25 mg total) by mouth 3 (three) times daily as needed.  50 tablet  5  . metoprolol succinate (TOPROL-XL) 100 MG 24 hr tablet TAKE 1 TABLET BY MOUTH DAILY AS DIRECTED  30 tablet  6  . Multiple Vitamins-Minerals (ICAPS) TABS Take 1 tablet by mouth 2 (two) times daily.        . pilocarpine (PILOCAR) 4 % ophthalmic solution 1 drop into left eye every 4 hrs as needed      . potassium chloride SA (K-DUR,KLOR-CON) 20 MEQ tablet TAKE 3 TABLETS BY MOUTH DAILY  270 tablet  0  . Tamsulosin HCl (FLOMAX) 0.4 MG CAPS Take 0.4 mg by mouth daily.        Marland Kitchen telmisartan (MICARDIS) 80 MG tablet TAKE 1 TABLET BY MOUTH EVERY DAY  30 tablet  6  . torsemide (DEMADEX) 20 MG tablet TAKE 2 TABLETS BY MOUTH AM AND 1 BY MOUTH  EVERY EVENING  90 tablet  6  . verapamil (CALAN-SR) 120 MG CR tablet Take 1 tablet (120 mg total) by mouth at bedtime.  30 tablet  3  . warfarin (COUMADIN) 5 MG tablet Take as directed by coumadin clinic  90 tablet  1   No current facility-administered medications for this visit.    Past Medical History  Diagnosis Date  . Unspecified essential hypertension   . Congestive heart failure, unspecified   . Atrial fibrillation   . Aneurysm of pulmonary artery   . Pure hypercholesterolemia   . Unspecified hemorrhoids without mention of complication   . Unspecified cerebral artery occlusion with cerebral infarction   . Generalized osteoarthrosis, unspecified site   . Gout, unspecified   . Pulmonary hypertension   . Aortic stenosis   . Renal insufficiency   . BPH (benign prostatic hypertrophy)   . CVA (cerebral infarction)   . DJD (degenerative joint disease)     Past Surgical History  Procedure Laterality Date  . Appendectomy    . Inguinal hernia repair    . Cataract extraction    . Cosmetic eye surgery  ROS:  As stated in the HPI and negative for all other systems.  PHYSICAL EXAM Pulse 91  Ht 5\' 11"  (1.803 m)  Wt 192 lb (87.091 kg)  BMI 26.79 kg/m2 GENERAL:  Well appearing HEENT:  Pupils equal round and reactive, fundi not visualized, oral mucosa unremarkable, upper denture NECK:  No jugular venous distention, waveform within normal limits, carotid upstroke brisk and symmetric, transmitted murmur vs bruit, no thyromegaly LUNGS:  Clear to auscultation bilaterally BACK:  No CVA tenderness CHEST:  Unremarkable HEART:  PMI not displaced or sustained,S1 and S2 within normal limits, no S3, no clicks, no rubs, apical mid peaking systolic murmur, irregular ABD:  Flat, positive bowel sounds normal in frequency in pitch, no bruits, no rebound, no guarding, no midline pulsatile mass, no hepatomegaly, no splenomegaly EXT:  2 plus pulses throughout, no edema, no cyanosis no  clubbing   EKG:  Atrial fibrillation, left axis deviation, rate 91, lateral T wave inversions, no change from previous. 09/11/2013  ASSESSMENT AND PLAN  AORTIC STENOSIS -  The patient has declined followup echo in the past. He wants conservative management. He has no new symptoms. No change in therapy is indicated.  ATRIAL FIBRILLATION -  The patient tolerates this rhythm and rate control and anticoagulation. He has no desire to switch to one of the newer agents.  HYPERTENSION -  The blood pressure is at target. No change in medications is indicated. We will continue with therapeutic lifestyle changes (TLC).  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

## 2013-09-11 NOTE — Patient Instructions (Signed)
The current medical regimen is effective;  continue present plan and medications.  Follow up in 1 year with Dr Hochrein.  You will receive a letter in the mail 2 months before you are due.  Please call us when you receive this letter to schedule your follow up appointment.  

## 2013-09-15 ENCOUNTER — Other Ambulatory Visit: Payer: Self-pay

## 2013-09-15 MED ORDER — VERAPAMIL HCL ER 120 MG PO TBCR: 120.0000 mg | EXTENDED_RELEASE_TABLET | Freq: Every day | ORAL | Status: DC

## 2013-09-22 ENCOUNTER — Other Ambulatory Visit: Payer: Self-pay | Admitting: Pulmonary Disease

## 2013-09-24 ENCOUNTER — Other Ambulatory Visit: Payer: Self-pay | Admitting: Pulmonary Disease

## 2013-10-07 ENCOUNTER — Ambulatory Visit (INDEPENDENT_AMBULATORY_CARE_PROVIDER_SITE_OTHER): Payer: Medicare Other | Admitting: *Deleted

## 2013-10-07 DIAGNOSIS — I6350 Cerebral infarction due to unspecified occlusion or stenosis of unspecified cerebral artery: Secondary | ICD-10-CM

## 2013-10-07 DIAGNOSIS — Z7901 Long term (current) use of anticoagulants: Secondary | ICD-10-CM

## 2013-10-07 DIAGNOSIS — I4891 Unspecified atrial fibrillation: Secondary | ICD-10-CM

## 2013-10-07 LAB — POCT INR: INR: 2.7

## 2013-10-13 ENCOUNTER — Other Ambulatory Visit: Payer: Self-pay | Admitting: Cardiology

## 2013-10-20 ENCOUNTER — Other Ambulatory Visit: Payer: Self-pay | Admitting: Pulmonary Disease

## 2013-10-23 NOTE — Telephone Encounter (Signed)
AT[facility] home # line rang several times and no answer and no VM Called mobile # and spoke with pt. He is aware SN is retiring from [facility] 11/05/13. He has been set up with new pt appt Dr. Sarajane Jews 01/22/14 8:30-pt needs to be fasting. Pt has been made aware of appt, address, phone #. Nothing further needed

## 2013-10-28 ENCOUNTER — Other Ambulatory Visit: Payer: Self-pay | Admitting: Pulmonary Disease

## 2013-11-04 ENCOUNTER — Ambulatory Visit (INDEPENDENT_AMBULATORY_CARE_PROVIDER_SITE_OTHER): Payer: Medicare Other

## 2013-11-04 VITALS — BP 128/92 | HR 83 | Resp 16

## 2013-11-04 DIAGNOSIS — B351 Tinea unguium: Secondary | ICD-10-CM

## 2013-11-04 DIAGNOSIS — I4891 Unspecified atrial fibrillation: Secondary | ICD-10-CM

## 2013-11-04 DIAGNOSIS — Z7901 Long term (current) use of anticoagulants: Secondary | ICD-10-CM

## 2013-11-04 DIAGNOSIS — M79609 Pain in unspecified limb: Secondary | ICD-10-CM

## 2013-11-04 DIAGNOSIS — I6350 Cerebral infarction due to unspecified occlusion or stenosis of unspecified cerebral artery: Secondary | ICD-10-CM

## 2013-11-04 LAB — POCT INR: INR: 1.8

## 2013-11-04 NOTE — Progress Notes (Signed)
   Subjective:    Patient ID: [name] [name], male    DOB: 12/09/1925, 80 y.o.   MRN: [id number]  HPI Comments: "Cut my toenails. My right big toe feels ingrown"      Review of Systems no other new changes or findings at this time to     Objective:   Physical Exam Neurovascular status is intact pedal pulses palpable DP and PT bilateral. There is Refill timed 3-4 seconds epicritic and proprioceptive sensations intact and symmetric. Nails thick brittle crumbly friable most significant of the right hallux medial border which is painful ingrowing be K. for topical antifungal therapy we'll dispensed a topical debrider for patient use daily for the affected nails for 6-12 months. At this time nails thick brittle chromic friable criptotic are debrided orthopedic biomechanical exam otherwise unremarkable no open wounds ulcerations noted no secondary infections no      Assessment & Plan:  Assessment onychomycosis painful mycotic nails debrided x10 return for future palliative mycotic nail care and as-needed basis suggest 3 month followup it to topical nail antifungal Almyra Free application daily to the affected hallux nails bilateral.  Harriet Masson DPM

## 2013-11-04 NOTE — Patient Instructions (Signed)

## 2013-11-11 ENCOUNTER — Other Ambulatory Visit: Payer: Self-pay | Admitting: Pulmonary Disease

## 2013-11-11 MED ORDER — EPLERENONE 25 MG PO TABS: ORAL_TABLET | ORAL | Status: DC

## 2013-11-11 MED ORDER — TELMISARTAN 80 MG PO TABS: ORAL_TABLET | ORAL | Status: DC

## 2013-11-25 ENCOUNTER — Ambulatory Visit (INDEPENDENT_AMBULATORY_CARE_PROVIDER_SITE_OTHER): Payer: Medicare Other | Admitting: *Deleted

## 2013-11-25 DIAGNOSIS — I6350 Cerebral infarction due to unspecified occlusion or stenosis of unspecified cerebral artery: Secondary | ICD-10-CM

## 2013-11-25 DIAGNOSIS — Z7901 Long term (current) use of anticoagulants: Secondary | ICD-10-CM

## 2013-11-25 DIAGNOSIS — I4891 Unspecified atrial fibrillation: Secondary | ICD-10-CM

## 2013-11-25 LAB — POCT INR: INR: 1.8

## 2013-11-26 ENCOUNTER — Other Ambulatory Visit: Payer: Self-pay

## 2013-11-26 MED ORDER — EPLERENONE 25 MG PO TABS: ORAL_TABLET | ORAL | Status: DC

## 2013-11-26 MED ORDER — POTASSIUM CHLORIDE CRYS ER 20 MEQ PO TBCR: EXTENDED_RELEASE_TABLET | ORAL | Status: DC

## 2013-11-26 NOTE — Telephone Encounter (Signed)
I did not refill elpleronone I made a mistake and deleted this med so to put it back on the sanpshot I had to reorder it

## 2013-12-04 ENCOUNTER — Telehealth: Payer: Self-pay | Admitting: Pulmonary Disease

## 2013-12-04 DIAGNOSIS — L989 Disorder of the skin and subcutaneous tissue, unspecified: Secondary | ICD-10-CM

## 2013-12-04 NOTE — Telephone Encounter (Signed)
Called and spoke with pts wife and she stated that the pt has a dark spot on the top of his head and it seems to be spreading.  Pt denies any itching to the area.  Referral has been placed and the pts wife is aware.

## 2013-12-04 NOTE — Telephone Encounter (Signed)
lmomtcb x1 

## 2013-12-18 ENCOUNTER — Ambulatory Visit (INDEPENDENT_AMBULATORY_CARE_PROVIDER_SITE_OTHER): Payer: Medicare Other | Admitting: *Deleted

## 2013-12-18 DIAGNOSIS — I6350 Cerebral infarction due to unspecified occlusion or stenosis of unspecified cerebral artery: Secondary | ICD-10-CM

## 2013-12-18 DIAGNOSIS — I4891 Unspecified atrial fibrillation: Secondary | ICD-10-CM

## 2013-12-18 DIAGNOSIS — Z7901 Long term (current) use of anticoagulants: Secondary | ICD-10-CM

## 2013-12-18 LAB — POCT INR: INR: 3

## 2014-01-06 ENCOUNTER — Other Ambulatory Visit: Payer: Self-pay | Admitting: *Deleted

## 2014-01-06 MED ORDER — VERAPAMIL HCL ER 120 MG PO TBCR: 120.0000 mg | EXTENDED_RELEASE_TABLET | Freq: Every day | ORAL | Status: DC

## 2014-01-08 ENCOUNTER — Ambulatory Visit (INDEPENDENT_AMBULATORY_CARE_PROVIDER_SITE_OTHER): Payer: Medicare Other | Admitting: Pulmonary Disease

## 2014-01-08 ENCOUNTER — Ambulatory Visit (INDEPENDENT_AMBULATORY_CARE_PROVIDER_SITE_OTHER)
Admission: RE | Admit: 2014-01-08 | Discharge: 2014-01-08 | Disposition: A | Discharge status: Home / Self Care | Payer: Medicare Other | Source: Ambulatory Visit | Attending: Pulmonary Disease | Admitting: Pulmonary Disease

## 2014-01-08 ENCOUNTER — Encounter (INDEPENDENT_AMBULATORY_CARE_PROVIDER_SITE_OTHER): Payer: Self-pay

## 2014-01-08 ENCOUNTER — Encounter: Payer: Self-pay | Admitting: Pulmonary Disease

## 2014-01-08 VITALS — BP 132/84 | HR 82 | Temp 97.5°F | Ht 71.0 in | Wt 187.6 lb

## 2014-01-08 DIAGNOSIS — N259 Disorder resulting from impaired renal tubular function, unspecified: Secondary | ICD-10-CM

## 2014-01-08 DIAGNOSIS — I2789 Other specified pulmonary heart diseases: Secondary | ICD-10-CM

## 2014-01-08 DIAGNOSIS — I4891 Unspecified atrial fibrillation: Secondary | ICD-10-CM

## 2014-01-08 DIAGNOSIS — I10 Essential (primary) hypertension: Secondary | ICD-10-CM

## 2014-01-08 DIAGNOSIS — M109 Gout, unspecified: Secondary | ICD-10-CM

## 2014-01-08 DIAGNOSIS — M159 Polyosteoarthritis, unspecified: Secondary | ICD-10-CM

## 2014-01-08 DIAGNOSIS — E7800 Pure hypercholesterolemia, unspecified: Secondary | ICD-10-CM

## 2014-01-08 DIAGNOSIS — R0600 Dyspnea, unspecified: Secondary | ICD-10-CM

## 2014-01-08 DIAGNOSIS — I359 Nonrheumatic aortic valve disorder, unspecified: Secondary | ICD-10-CM

## 2014-01-08 DIAGNOSIS — I281 Aneurysm of pulmonary artery: Secondary | ICD-10-CM

## 2014-01-08 DIAGNOSIS — I509 Heart failure, unspecified: Secondary | ICD-10-CM

## 2014-01-08 NOTE — Patient Instructions (Signed)
Today we updated your med list in our EPIC system...    Continue your current medications the same...  Today we did your follow up CXR... Please return to our lab one day next week for your FASTING blood work at [facility]...    We will contact you w/ the results when available...   Keep up the good work w/ diet & increase your exercise program...  Call for any questions or if we can be of service in any way,.Marland KitchenMarland Kitchen

## 2014-01-08 NOTE — Progress Notes (Signed)
Subjective:    Patient ID: [name] [name], male    DOB: 20-Aug-1925, 80 y.o.   MRN: [id number]  HPI 80 y/o BM here for a follow up visit... he has multiple medical problems including HBP, CAD, AS, AFib, & CHF followed by DrHochrein & the CHFclinic;  known PulmHTN & PA aneurysm conservatively managed by DrGearhardt;  Hyperchol;  RI & BPH;  DJD & Gout... ~  SEE PREV EPIC NOTES FOR OLDER DATA >>   ~  July 16, 2012:  97moROV & [name] [name] reports worsening vision due to his Glaucoma- on eye drops & followed closely by Ophthalmology;  BP was recently elev at DrTannenbaum's office & we decided to increase his doses of Clonidine & Metoprolol meds...  We reviewed the following medical problems during today's office visit>>     HBP> on MetoprololER100-1/2 daily, CalanSR120, Micardis80, Clonidine0.2-1/2Bid, Demadex20-3/d, Epleronone25, K20-3/d; BP=144/92 & he has sl HA; we decided to incr his Metopto 1038md & the Clonidine0.2 to 1 tab Bid...    CHF> followed by DrHochrein for Cards- w/ severe Diastolic CHF, normal C's by cath in 2005, & normal LV systolic function w/ EFPT=46%notes DOE- no change.    AS> w/ severely calcif AoV leaflets & mod AS w/ 2551mean gradient (peak=39); pt desired conservative Rx, DrHochrein's notes are reviewed...    AFib> on Coumadin via CC; stable AFib on Coumadin w/ rate control...    Pulm Art aneurysm & PulmHTN> O2sat=94% on RA at rest & he has home O2 but just uses it "prn";  3 cm PA aneurysm followed by DrGearhardt without change x yrs...    CHOL> on Lip20; last FLP 6/12 showed TChol 162, TG 95, HDL 46, LDL 97; he doesn't seem to remember to come fasting for f/u labs...    Renal Insuffic, BPH> on Flomax0.4; baseline Creat= 1.5-1.6 range; followed by DrTannenbaum & managed conservatively...    DJD, Gout> on Tramadol, Allopurinol100, VitD2000; stable w/o specific complaints at present... We reviewed prob list, meds, xrays and labs> see below for updates >> he had the 2013 Flu  vaccine in Oct...   NOTE> pt did not go to the lab for his BMet & BNP as requested...  ADDENDUM: DrHochrein ordered CDopplers 12/13> mild heterogeneous plaque on right & mixed irreg plaque on left, 0-39% bilat ICA stenoses & antegrade vertebral flow; f/u 46yr47yr  ~  January 14, 2013:  75mo 75mo& [name] [name] is stable- no new complaints or concerns; he has a sm lipoma on the right post neck area (pointed out by the [facility] for [employer]);[facility] reviewed the following medical problems during today's office visit >>     HBP> on MetoprololER100, CalanSR120, Micardis80, Clonidine0.2Bid, Demadex20-3/d, Epleronone25, K20-3/d; BP=130/80 & he is asked to monitor BP at home more closely; he denies CP, palpit, dizzy, ch in SOB/DOE, edema...    CHF> followed by DrHochrein for Cards- w/ severe Diastolic CHF, normal C's by cath in 2005, & normal LV systolic function w/ EF=75FK=81%n 1/14- stable, notes DOE- no change, pt refused f/u 2DEcho, rec conservative Rx...    AS> w/ severely calcif AoV leaflets & mod AS w/ 25mm 30m gradient (peak=39); last 2DEcho 10/12- pt desired conservative Rx, DrHochrein's notes are reviewed...    AFib> on Coumadin via CC; stable AFib on Coumadin w/ rate control...    Pulm Art aneurysm & PulmHTN> O2sat=94% on RA at rest & he has home O2 but just uses it "prn";  3cm PA aneurysm followed  by DrGearhardt without change x yrs...    CHOL> on Lip20; last FLP 6/12 showed TChol 162, TG 95, HDL 46, LDL 97; asked to ret for f/u FASTING labs    Renal Insuffic, BPH> on Flomax0.4; baseline Creat= 1.4-1.6 range; followed by DrTannenbaum & managed conservatively, last seen 10/13- note reviewed...    DJD, Gout> on Tramadol, Allopurinol100, VitD2000; stable w/o specific complaints at present... We reviewed prob list, meds, xrays and labs> see below for updates >>   CXR 6/14 showed borderline cardiomeg, extremely tort Ao, PA dilatation is the same (no change), elev left hemidiaph, scarring at bases,  NAD...  LABS 6/14:  Pending...  ~  July 15, 2013:  45moROV & [name] [name] is stable, here w/ his daighter who confirms; as noted he is blind in left eye w/ poor vision on right as well (can't read or drive, can see some TV from a distance), hx Glaucoma Rx by DrBond; he has not been exercising much- has a lCivil Service fast streamernamed [name] [name] & I suggested he get out & walk the dog w/ family help...  He has some minor somatic complaints- notes "ridges, wrinkles" on his scalp that seem to come & go, notes mild arthritis pain in back, VI/ edema in legs; we offered reassurance, discussed Aleve, Tylenol, no salt, elevation, support hose & continue current meds...    HBP> on MetoprololER100, CalanSR120, Micardis80, Clonidine0.2Bid, Demadex20-3/d, Epleronone25, K20-3/d; BP=132/78 & he denies CP, palpit, dizzy, ch in SOB/DOE, edema...    CHF> followed by DrHochrein for Cards- w/ severe Diastolic CHF, normal C's by cath in 2005, & normal LV systolic function w/ EQQ=76% seen 1/14- stable, notes DOE- no change, pt refused f/u 2DEcho, rec conservative Rx...    AS> w/ severely calcif AoV leaflets & mod AS w/ 2653mmean gradient (peak=39); last 2DEcho 10/12- pt desired conservative Rx, DrHochrein's notes are reviewed...    AFib> on Coumadin via CC; stable AFib on Coumadin w/ rate control from [place].    Pulm Art aneurysm & PulmHTN> O2sat=98% on RA at rest today & he has home O2 but just uses it "prn";  3cm PA aneurysm followed by DrGearhardt without change x yrs...    CHOL> on Lip20; last FLP 6/14 showed TChol 150, TG 89, HDL 51, LDL 81... Continue same rx...    Renal Insuffic, BPH> on Flomax0.4; baseline Creat= 1.4-1.6 range; followed by DrTannenbaum & managed conservatively, last seen 10/13- note reviewed...    DJD, Gout> on Allopurinol100, VitD2000, Aleve/ Tylenol; stable w/o specific complaints at present...  ~  January 08, 2014:  53m9moV & [name] [name] reports interval eval by DerPayton Mccallumr rash on scalp (looks like Seb  Dermatitis) and w/ Podiatry- he indicates improved...     HBP> on MetoprololER100, CalanSR120, Micardis80, Clonidine0.2Bid, Demadex20-3/d, Epleronone25, K20-3/d; BP=132/84 & he denies CP, palpit, dizzy, ch in SOB/DOE, edema...    CHF> followed by DrHochrein for Cards- w/ severe Diastolic CHF, normal C's by cath in 2005, & normal LV systolic function w/ EF=PP=50%een 2/15- stable, notes DOE- no change, pt refused f/u 2DEcho, rec conservative Rx...    AS> w/ severely calcif AoV leaflets & mod AS w/ 26m52man gradient (peak=39); last 2DEcho 10/12- pt desired conservative Rx, DrHochrein's notes are reviewed...    AFib> on Coumadin via CC; stable AFib on Coumadin w/ [place] wants to continue as is...    Pulm Art aneurysm & PulmHTN> O2sat=95% on RA at rest today & he  has home O2 but just uses it "prn";  3cm PA aneurysm followed by DrGearhardt without change x yrs...    CHOL> on Lip20; FLP 6/15 shows TChol 147, TG 82, HDL 55, LDL 76... Stable, continue same rx...    Renal Insuffic, BPH> on Flomax0.4; baseline Creat= 1.4-1.6 range; followed by DrTannenbaum & managed conservatively, last seen 10/13- note reviewed...    DJD, Gout> on Allopurinol100, VitD2000, Aleve/ Tylenol; stable w/o specific complaints at present... We reviewed prob list, meds, xrays and labs> see below for updates >>   CXR 6/15 showed similar prominent PA segment, mild cardiomeg & enlarged desc thor Ao, clear lungs w/ elev left hemidiaph (stable), DJD in Tspine...   LABS 6/15:  FLP- at goals on Lip20;  Chems- ok w/ BS=121 on diet, Cr=1.6 stable RI;  CBC- wnl;  TSH=2.80;  Uric=6.2 on allopurinol100;  BNP=511           Problem List:  OPHTHALMOLOGY - s/p cataract surg & followed by DrBond for worsening Glaucoma & poor vision on gtts daily...  PULMONARY HYPERTENSION (ICD-416.8) - on home oxygen 2L/min "Prn"... ~  Prev 2DEcho w/ est PAsys= 64... he has not had a right heart cath... ~  Repeat 2DEcho 10/12 showed  mod LVH, norm sys function w/ EF=55-60% & norm wall motion, Gr2DD, severely calcif AoV leaflets w/ mod AS & 41m mean gradient (peak=39), mild MR, severe LAdil & RAdil, RV sys function normal, modTR, PAsys=43... ~  6/13:  O2sat today is 95% on RA at rest;  CXR 6/13 showed mod cardiomeg unchanged, ectasia & calcif in Ao, enlarged right pulm art (no change), elev left hemidiaph w/ bibasilar atx, NAD... ~  6/14:  O2sat today is 94% on RA at rest;  CXR 6/14 showed borderline cardiomeg, extremely tort Ao, PA dilatation is the same (no change), elev left hemidiaph, scarring at bases, NAD... ~  6/15:  O2sat today is 95% on RA at rest;  CXR 6/15 showed similar prominent PA segment, mild cardiomeg & enlarged desc thor Ao, clear lungs w/ elev left hemidiaph (stable), DJD in Tspine.  HYPERTENSION (ICD-401.9) -  ~  on METOPROLOL ER 1071m 1/2 tab daily,  VERAPAMIL 24030m,  MICARDIS 79m82m  CLONIDINE 0.2mg-3m2 Bid,  DEMADEX 20mg-39m & 1pm,  EPLERENONE 25mg/d76mK20- 3/d; he was prev on Minoxidil  but this discontinued by Cardiology... ~  6/10: DrHochrein added back the Clonidine & asked him, again, to monitor BP at home. ~  12/10:  they requested trial to decr K20's from 6/d required due to his Demedex... suggest adding ALDACTONE 25mg/d 83mcr K20 to 4/d w/ f/u labs... ~  6/11:  Aldactone caused gynecomastia & was switched to EPLERENONE 25mg/d b49mryParker w/ some improvment in side effect. ~  6/12:  BP controlled & labs sl improved w/ the Demadex decr to 3/d... ~  12/12:  BP= 126/72 today and not checking at home, tolerating meds well; denies HA, visual changes, CP, palipit, dizziness, syncope; he has chr DOE without change and mild edema... ~  6/13:  BP= 120/72 & he remains stable w/o CP, palpit, +SOB w/o change, tr edema... ~  12/13: on MetoprololER100-1/2 daily, CalanSR120, Micardis80, Clonidine0.2-1/2Bid, Demadex20-3/d, Epleronone25, K20-3/d; BP=144/92 & he has sl HA; we decided to incr his Metopto 100mg/d  &30m Clonidine0.2 to 1 tab Bid. ~  6/14: on MetoprololER100, CalanSR120, Micardis80, Clonidine0.2Bid, Demadex20-3/d, Epleronone25, K20-3/d; BP=130/80 & he is asked to monitor BP at home more closely; he denies CP, palpit, dizzy,  ch in SOB/DOE, edema. ~  12/14: on MetoprololER100, CalanSR120, Micardis80, Clonidine0.2Bid, Demadex20-3/d, Epleronone25, K20-3/d; BP=132/78 & he denies CP, palpit, dizzy, ch in SOB/DOE, edema. ~  6/15: on MetoprololER100, CalanSR120, Micardis80, Clonidine0.2Bid, Demadex20-3/d, Epleronone25, K20-3/d; BP=132/84 & he remains asymptomatic but sedentary...  CHF (ICD-428.0) - see meds above... followed in the [facility]F Clinic by DrHochrein;  he has severe Diastolic CHF, w/ normal C's by cath in 2005, & normal LV systolic function w/ QA=83%... notes DOE- no change. ~  labs 6/09 showed BNP= 761 ~  labs 9/09 showed BNP= 486 ~  labs 12/09 showed BNP = 568 ~  labs 6/10 showed BNP= 421 ~  labs 12/10 showed BNP= 577... adding SPIRONOLACTONE 73m/d. ~  labs 1/11 showed BNP= 395... later ch to EPLERENONE 29md. ~  labs 12/11 showed BNP= 535 ~  Labs 6/12 showed BNP= 524 (on Demedex3/d + Eplerenone2532m). ~  Labs 12/12 showed BNP= 371 ~  4/13:  He had f/u DrHochrein> Mod AS, chr AFib, DD, etc; stable, no progressive symptoms & pt preferred conserv Rx; they discussed poss TAVR in future... ~  Labs 6/13 showed BNP= 381 (note: BUN=30 Creat=1.5) ~  1/14:  He had f/u DrHochrein> mult cardiovasc problems, doing well, no CP etc; rec salt & fluid restriction, pt declined f/u 2DEcho, on Coumadin & rate control, continue conservative management... ~  2/15:  He had Cards f/u w/ DrHochrein> stable, notes DOE- no change, pt refused f/u 2DEcho, rec conservative Rx. ~  Stable on diuretic therapy, no changes made...  AORTIC STENOSIS (ICD-424.1) - moderate by 2DEcho & followed by DrHochrein... ~  repeat 2DEcho 1/09 hosp = similiar mod AS... ~  Repeat 2DEcho 1/12 showed mild LVH, norm sys function w/  EF=55-60% & no regional wall motion abn, mod AS, dilated LA&RA, modTR, PAsys=50... ~  Repeat 2DEcho 10/12 showed mod LVH, norm sys function w/ EF=55-60% & norm wall motion, Gr2DD, severely calcif AoV leaflets w/ mod AS & 38m84man gradient (peak=39), mild MR, severe LAdil & RAdil, RV sys function normal, modTR, PAsys=43... ~  Pt has refused f/u 2DEcho...  ATRIAL FIBRILLATION (ICD-427.31) - on COUMADIN & followed in the Coumadin Clinic. ~  EKG 4/13 showed AFib, rate82, LAD, NSSTTWA...  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 ~  CXR 1/09 showed unchanged right PA aneurysm, stable cardiomeg, elevated left hemidiaph w/ atx. ~  CXR 6/10 showed no change, Ao tortuous and calcif, NAD. ~  CXR 12/11 showed stable elv left hemidiaph, bibasilar atx/ scarring, cardiomeg, ectatic ao, right PA enlargement. ~  CXR 6/13 showed mod cardiomeg unchanged, ectasia & calcif in Ao, enlarged right pulm art (no change), elev left hemidiaph w/ bibasilar atx, NAD... ~Marland Kitchen CXR 6/14 showed borderline cardiomeg, tortAo, PA dilatation is the same (no change), elev left hemidiaph, scarring at bases, NAD. ~  CXR 6/15 showed similar prominent PA segment, mild cardiomeg & enlarged desc thor Ao, clear lungs w/ elev left hemidiaph (stable), DJD in Tspine.  ASPVD >> on Coumadin for his AFib... ~  DrHochrein ordered CDopplers 12/13> mild heterogeneous plaque on right & mixed irreg plaque on left, 0-39% bilat ICA stenoses & antegrade vertebral flow; f/u 87yr.83yrHYPERCHOLESTEROLEMIA (ICD-272.0) - now on LIPITOR 20mg/37mrev Simva40 changed due to concomitant Verap). ~  FLP [place] showed TChol 95, TG 39, HDL 36, LDL 51 ~  FLP 12/09 on Simva40 showed TChol 167, TG 68, HDL 64, LDL 90 ~  FLP 1/11 on Simva40 showed TChol 153, TG 101, HDL 50, LDL 83... later ch to [place]. ~  [place] 6/12 on Lip20 showed TChol 162, TG 95, HDL 46, LDL 97 ~  6/13: not fasting for FLP  today... ~  6/14: FLP on Lip20 showed TChol 150, TG 89, HDL 51, LDL 81... ~  FLP 6/15 on Lip20 showed TChol 147, TG 82, HDL 55, LDL 76...  HEMORRHOIDS (ICD-455.6)  RENAL INSUFFICIENCY (ICD-588.9)  ~  Creat=1.4 - 1.6 during 1/09 hosp... ~  labs 6/09 & 9/09 showed Creat= 1.3 ~  labs 12/09 showed BUN= 20, Creat= 1.3, K= 4.0 ~  labs 6/10 showed BUN= 24, Creat= 1.3, K= 3.8 ~  labs 12/10 showed BUN= 12, Creat= 1.2, K= 3.6 (on 6 K20/d) ~  labs 4/11 showed BUN= 18, Creat= 1.4, K= 4.0 (on 4 KCl/d) ~  labs 12/11 showed BUN= 32, Creat= 1.8, K= 4.3 ~  Labs 6/12 showed BUN= 27, creat= 1.6, K= 4.1 ~  Labs 12/12 showed BUN=21, Creat=1.6, K=3.9 ~  Labs 6/13 showed BUN= 30, Creat= 1.5, K= 4.5 ~  Labs 6/14 showed BUN= 26, Cr= 1.5, K= 3.5 (encouraged to take the KCl 23mq- 3/d)... ~  Labs 6/15 showed BUN= 25, Cr= 1.6, K= 3.9  BENIGN PROSTATIC HYPERTROPHY, HX OF (ICD-V13.8) - followed by DrTannenbaum & taking FLOMAX 0.429md... last seen 5/09 and his note is reviewed... ~  labs 12/09 showed PSA= 1.42 ~  labs 2/11 showed PSA= 1.65 ~  Labs 6/13 showed PSA= 1.58 ~  10/13: he had Urology f/u DrTannenbaum> BPH w/ BOO, nocturia, mild Uincont; he didn't want any intervention & no changes made to his regimen...  CVA (ICD-434.91) - Stroke in 1990 in the setting of HBP...  DEGENERATIVE JOINT DISEASE, GENERALIZED (ICD-715.00) - he uses OTC pain meds & TRAMADOL 5010mrn...  GOUT (ICD-274.9) - on ALLOPURINOL 100m63m  & now off the Colchicine... ~  labs 10/08 showed Uric= 10.3 ~  labs 12/09 showed Uric= 7.7... hMarland Kitchen wants to continue same meds. ~  labs 12/11 showed Uric = 6.4 ~  Labs 6/15 showed Uric= 6.2  INGUINAL HERNIA (ICD-550.90)  Health Maintenance - he receives the yearly seasonal Flu vaccines... f/u PNEUMOVAX at his request 2010...   Past Surgical History  Procedure Laterality Date  . Appendectomy    . Inguinal hernia repair    . Cataract extraction    . Cosmetic eye surgery      Outpatient  Encounter Prescriptions as of 01/08/2014  Medication Sig  . allopurinol (ZYLOPRIM) 100 MG tablet TAKE 1 TABLET BY MOUTH DAILY  . atorvastatin (LIPITOR) 20 MG tablet TAKE 1 TABLET BY MOUTH DAILY  . bimatoprost (LUMIGAN) 0.03 % ophthalmic solution Place 1 drop into both eyes at bedtime.    . brinzolamide (AZOPT) 1 % ophthalmic suspension Place 1 drop into both eyes 2 (two) times daily.    . Cholecalciferol (VITAMIN D) 2000 UNITS CAPS Take 1 capsule by mouth daily.    . cloNIDine (CATAPRES) 0.2 MG tablet TAKE 1 TABLET BY MOUTH TWICE DAILY  . eplerenone (INSPRA) 25 MG tablet TAKE 1 TABLET BY MOUTH EVERY DAY  . meclizine (ANTIVERT) 25 MG tablet Take 1 tablet (25 mg total) by mouth 3 (three) times daily as needed.  . metoprolol succinate (TOPROL-XL) 100 MG 24 hr tablet TAKE 1 TABLET BY MOUTH DAILY AS DIRECTED  . Multiple Vitamins-Minerals (ICAPS) TABS Take 1 tablet by mouth 2 (two) times daily.    . pilocarpine (PILOCAR) 4 %  ophthalmic solution 1 drop into left eye every 4 hrs as needed  . potassium chloride SA (K-DUR,KLOR-CON) 20 MEQ tablet TAKE 3 TABLETS BY MOUTH DAILY  . Tamsulosin HCl (FLOMAX) 0.4 MG CAPS Take 0.4 mg by mouth daily.    Marland Kitchen telmisartan (MICARDIS) 80 MG tablet TAKE 1 TABLET BY MOUTH DAILY  . torsemide (DEMADEX) 20 MG tablet TAKE 2 TABLETS BY MOUTH AM AND 1 BY MOUTH EVERY EVENING  . verapamil (CALAN-SR) 120 MG CR tablet Take 1 tablet (120 mg total) by mouth at bedtime.  Marland Kitchen warfarin (COUMADIN) 5 MG tablet Take as directed by coumadin clinic    Allergies  Allergen Reactions  . Ibuprofen     REACTION: causes low blood pressure    Current Medications, Allergies, Past Medical History, Past Surgical History, Family History, and Social History were reviewed in [employer] record.    Review of Systems        See HPI - all other systems neg except as noted... The patient complains of decreased hearing, dyspnea on exertion, peripheral edema, and difficulty  walking.  The patient denies anorexia, fever, weight loss, weight gain, vision loss, hoarseness, chest pain, syncope, prolonged cough, headaches, hemoptysis, abdominal pain, melena, hematochezia, severe indigestion/heartburn, hematuria, incontinence, muscle weakness, suspicious skin lesions, transient blindness, depression, unusual weight change, abnormal bleeding, enlarged lymph nodes, and angioedema.   Objective:   Physical Exam     WD, WN, 80 y/o BM in NAD... GENERAL:  Alert & oriented; pleasant & cooperative... HEENT:  [place]/AT, EOM-wnl, PERRLA, EACs-clear, TMs-wnl, NOSE-clear, THROAT-clear & wnl. NECK:  Supple w/ fairROM; no JVD; sl decr carotid impulses w/ transmit murmur; no thyromegaly or nodules palpated; no lymphadenopathy. CHEST:  Clear to P & A; without wheezes/ rales/ or rhonchi heard... HEART:  sl irreg, gr 2/6 AS murmur at base, without rubs or gallops apprec... ABDOMEN:  Soft & nontender; normal bowel sounds; no organomegaly or masses detected. EXT: without deformities, mild arthritic changes; no varicose veins/ +venous insuffic/ tr edema. NEURO:  CN's intact; no focal neuro deficits... DERM:  No lesions noted; no rash etc...  RADIOLOGY DATA:  Reviewed in the EPIC EMR & discussed w/ the patient...  LABORATORY DATA:  Reviewed in the EPIC EMR & discussed w/ the patient...   Assessment & Plan:    HBP>  Adeq control on his extensive med regimen; we reviewed meds w/ family- they need to supervise & be sure he takes everything regularly...  CHF>  Stable on current meds; renal w/ Creat=1.6 stable as well...  AS>  Mod AS on 2DEcho & followed by DrHochrein (pt has declined f/u 2DEcho)  AFib>  He remains on Coumadin followed in the CC...  PA art aneurysm & Pulm HTN>  Stable on current med regimen & BP control...  CHOL>  FLP looks good on Lip20...  Renal Insuffic>  Creat improved to 1.5-1.6 & stable on current meds...  DJD/ Gout>  Stable as well, c/o some rib pain as noted;  we discussed rest/ heat/ rib binder & Tramadol...  Other medical problems as noted...   Patient's Medications  New Prescriptions   No medications on file  Previous Medications   ALLOPURINOL (ZYLOPRIM) 100 MG TABLET    TAKE 1 TABLET BY MOUTH DAILY   ATORVASTATIN (LIPITOR) 20 MG TABLET    TAKE 1 TABLET BY MOUTH DAILY   BIMATOPROST (LUMIGAN) 0.03 % OPHTHALMIC SOLUTION    Place 1 drop into both eyes at bedtime.     BRINZOLAMIDE (  AZOPT) 1 % OPHTHALMIC SUSPENSION    Place 1 drop into both eyes 2 (two) times daily.     CHOLECALCIFEROL (VITAMIN D) 2000 UNITS CAPS    Take 1 capsule by mouth daily.     EPLERENONE (INSPRA) 25 MG TABLET    TAKE 1 TABLET BY MOUTH EVERY DAY   MECLIZINE (ANTIVERT) 25 MG TABLET    Take 1 tablet (25 mg total) by mouth 3 (three) times daily as needed.   MULTIPLE VITAMINS-MINERALS (ICAPS) TABS    Take 1 tablet by mouth 2 (two) times daily.     PILOCARPINE (PILOCAR) 4 % OPHTHALMIC SOLUTION    1 drop into left eye every 4 hrs as needed   POTASSIUM CHLORIDE SA (K-DUR,KLOR-CON) 20 MEQ TABLET    TAKE 3 TABLETS BY MOUTH DAILY   TAMSULOSIN HCL (FLOMAX) 0.4 MG CAPS    Take 0.4 mg by mouth daily.     TELMISARTAN (MICARDIS) 80 MG TABLET    TAKE 1 TABLET BY MOUTH DAILY   VERAPAMIL (CALAN-SR) 120 MG CR TABLET    Take 1 tablet (120 mg total) by mouth at bedtime.   WARFARIN (COUMADIN) 5 MG TABLET    Take as directed by coumadin clinic  Modified Medications   Modified Medication Previous Medication   CLONIDINE (CATAPRES) 0.2 MG TABLET cloNIDine (CATAPRES) 0.2 MG tablet      TAKE 1 TABLET BY MOUTH TWICE DAILY    TAKE 1 TABLET BY MOUTH TWICE DAILY   METOPROLOL SUCCINATE (TOPROL-XL) 100 MG 24 HR TABLET metoprolol succinate (TOPROL-XL) 100 MG 24 hr tablet      TAKE 1 TABLET BY MOUTH EVERY DAY    TAKE 1 TABLET BY MOUTH DAILY AS DIRECTED   TORSEMIDE (DEMADEX) 20 MG TABLET torsemide (DEMADEX) 20 MG tablet      TAKE 2 TABLETS BY MOUTH EVERY AM AND 1 BY MOUTH EVERY PM    TAKE 2 TABLETS BY MOUTH  AM AND 1 BY MOUTH EVERY EVENING  Discontinued Medications   No medications on file

## 2014-01-13 ENCOUNTER — Other Ambulatory Visit (INDEPENDENT_AMBULATORY_CARE_PROVIDER_SITE_OTHER): Payer: Medicare Other

## 2014-01-13 DIAGNOSIS — I4891 Unspecified atrial fibrillation: Secondary | ICD-10-CM

## 2014-01-13 DIAGNOSIS — R0989 Other specified symptoms and signs involving the circulatory and respiratory systems: Secondary | ICD-10-CM

## 2014-01-13 DIAGNOSIS — R0600 Dyspnea, unspecified: Secondary | ICD-10-CM

## 2014-01-13 DIAGNOSIS — I10 Essential (primary) hypertension: Secondary | ICD-10-CM

## 2014-01-13 DIAGNOSIS — M109 Gout, unspecified: Secondary | ICD-10-CM

## 2014-01-13 DIAGNOSIS — E7800 Pure hypercholesterolemia, unspecified: Secondary | ICD-10-CM

## 2014-01-13 DIAGNOSIS — R0609 Other forms of dyspnea: Secondary | ICD-10-CM

## 2014-01-13 LAB — HEPATIC FUNCTION PANEL
ALK PHOS: 93 U/L (ref 39–117)
ALT: 7 U/L (ref 0–53)
AST: 14 U/L (ref 0–37)
Albumin: 4.3 g/dL (ref 3.5–5.2)
BILIRUBIN DIRECT: 0.1 mg/dL (ref 0.0–0.3)
BILIRUBIN TOTAL: 1 mg/dL (ref 0.2–1.2)
TOTAL PROTEIN: 7.5 g/dL (ref 6.0–8.3)

## 2014-01-13 LAB — CBC WITH DIFFERENTIAL/PLATELET
BASOS ABS: 0 10*3/uL (ref 0.0–0.1)
BASOS PCT: 0.3 % (ref 0.0–3.0)
Eosinophils Absolute: 0.2 10*3/uL (ref 0.0–0.7)
Eosinophils Relative: 3.2 % (ref 0.0–5.0)
HCT: 45.7 % (ref 39.0–52.0)
Hemoglobin: 14.8 g/dL (ref 13.0–17.0)
LYMPHS PCT: 22 % (ref 12.0–46.0)
Lymphs Abs: 1.5 10*3/uL (ref 0.7–4.0)
MCHC: 32.5 g/dL (ref 30.0–36.0)
MCV: 97.3 fl (ref 78.0–100.0)
MONO ABS: 0.5 10*3/uL (ref 0.1–1.0)
Monocytes Relative: 6.6 % (ref 3.0–12.0)
NEUTROS ABS: 4.7 10*3/uL (ref 1.4–7.7)
NEUTROS PCT: 67.9 % (ref 43.0–77.0)
Platelets: 205 10*3/uL (ref 150.0–400.0)
RBC: 4.7 Mil/uL (ref 4.22–5.81)
RDW: 17 % — AB (ref 11.5–15.5)
WBC: 6.9 10*3/uL (ref 4.0–10.5)

## 2014-01-13 LAB — LIPID PANEL
CHOL/HDL RATIO: 3
Cholesterol: 147 mg/dL (ref 0–200)
HDL: 54.9 mg/dL (ref >39.00)
LDL Cholesterol: 76 mg/dL (ref 0–99)
NONHDL: 92.1
Triglycerides: 82 mg/dL (ref 0.0–149.0)
VLDL: 16.4 mg/dL (ref 0.0–40.0)

## 2014-01-13 LAB — BASIC METABOLIC PANEL
BUN: 25 mg/dL — ABNORMAL HIGH (ref 6–23)
CHLORIDE: 104 meq/L (ref 96–112)
CO2: 29 meq/L (ref 19–32)
Calcium: 9.7 mg/dL (ref 8.4–10.5)
Creatinine, Ser: 1.6 mg/dL — ABNORMAL HIGH (ref 0.4–1.5)
GFR: 51.43 mL/min — ABNORMAL LOW (ref >60.00)
Glucose, Bld: 121 mg/dL — ABNORMAL HIGH (ref 70–99)
POTASSIUM: 3.9 meq/L (ref 3.5–5.1)
SODIUM: 141 meq/L (ref 135–145)

## 2014-01-13 LAB — TSH: TSH: 2.8 u[IU]/mL (ref 0.35–4.50)

## 2014-01-13 LAB — BRAIN NATRIURETIC PEPTIDE: PRO B NATRI PEPTIDE: 511 pg/mL — AB (ref 0.0–100.0)

## 2014-01-13 LAB — URIC ACID: URIC ACID, SERUM: 6.2 mg/dL (ref 4.0–7.8)

## 2014-01-15 ENCOUNTER — Ambulatory Visit: Payer: Medicare Other | Admitting: Pulmonary Disease

## 2014-01-20 ENCOUNTER — Ambulatory Visit: Payer: Medicare Other | Admitting: Family Medicine

## 2014-01-22 ENCOUNTER — Ambulatory Visit: Payer: Medicare Other | Admitting: Family Medicine

## 2014-01-23 ENCOUNTER — Ambulatory Visit (INDEPENDENT_AMBULATORY_CARE_PROVIDER_SITE_OTHER): Payer: Medicare Other | Admitting: *Deleted

## 2014-01-23 DIAGNOSIS — I4891 Unspecified atrial fibrillation: Secondary | ICD-10-CM

## 2014-01-23 DIAGNOSIS — Z7901 Long term (current) use of anticoagulants: Secondary | ICD-10-CM

## 2014-01-23 DIAGNOSIS — I6350 Cerebral infarction due to unspecified occlusion or stenosis of unspecified cerebral artery: Secondary | ICD-10-CM

## 2014-01-23 LAB — POCT INR: INR: 2.2

## 2014-01-26 ENCOUNTER — Ambulatory Visit: Payer: Medicare Other | Admitting: Family Medicine

## 2014-02-03 ENCOUNTER — Ambulatory Visit (INDEPENDENT_AMBULATORY_CARE_PROVIDER_SITE_OTHER): Payer: Medicare Other

## 2014-02-03 VITALS — BP 146/94 | HR 77 | Resp 16

## 2014-02-03 DIAGNOSIS — M79606 Pain in leg, unspecified: Secondary | ICD-10-CM

## 2014-02-03 DIAGNOSIS — M79609 Pain in unspecified limb: Secondary | ICD-10-CM

## 2014-02-03 DIAGNOSIS — B351 Tinea unguium: Secondary | ICD-10-CM

## 2014-02-03 NOTE — Patient Instructions (Signed)

## 2014-02-03 NOTE — Progress Notes (Signed)
   Subjective:    Patient ID: [name] [name], male    DOB: 05-25-1926, 80 y.o.   MRN: [id number]  HPI Comments: "Need him to get these toenails cut"     Review of Systems No new findings or systemic changes noted    Objective:   Physical Exam Lower extremity objective findings as follows pedal pulses are palpable DP and PT plus one over 4 bilateral capillary refill timed 3-4 seconds all digits epicritic and proprioceptive sensations intact and symmetric. Nails thick brittle crumbly incurvated 1 through 4 bilateral particular hallux nails medial border right hallux which is painful and symptomatic on debridement to a pinpoint bleeding which is treated with lumicain and Neosporin at this time. Patient advised to continue topical antifungal medication as instructed. No open wounds ulcerations noted at this time       Assessment & Plan:  Assessment onychomycosis painful mycotic nails 1 through 5 bilateral debridement at this time return for future palliative care in as needed contact us in changes or exacerbations in the interim Neosporin and Band-Aid applied to the right hallux following debridement today maintained for one or 2 days if needed  Harriet Masson DPM

## 2014-02-21 ENCOUNTER — Other Ambulatory Visit: Payer: Self-pay | Admitting: Pulmonary Disease

## 2014-02-24 ENCOUNTER — Ambulatory Visit (INDEPENDENT_AMBULATORY_CARE_PROVIDER_SITE_OTHER): Payer: Medicare Other | Admitting: *Deleted

## 2014-02-24 DIAGNOSIS — Z7901 Long term (current) use of anticoagulants: Secondary | ICD-10-CM

## 2014-02-24 DIAGNOSIS — I4891 Unspecified atrial fibrillation: Secondary | ICD-10-CM

## 2014-02-24 DIAGNOSIS — I6350 Cerebral infarction due to unspecified occlusion or stenosis of unspecified cerebral artery: Secondary | ICD-10-CM

## 2014-02-24 LAB — POCT INR: INR: 2.7

## 2014-03-22 ENCOUNTER — Other Ambulatory Visit: Payer: Self-pay | Admitting: Pulmonary Disease

## 2014-03-24 ENCOUNTER — Other Ambulatory Visit: Payer: Self-pay | Admitting: *Deleted

## 2014-03-24 MED ORDER — WARFARIN SODIUM 5 MG PO TABS: ORAL_TABLET | ORAL | Status: DC

## 2014-04-05 ENCOUNTER — Other Ambulatory Visit: Payer: Self-pay | Admitting: Cardiology

## 2014-04-17 ENCOUNTER — Ambulatory Visit (INDEPENDENT_AMBULATORY_CARE_PROVIDER_SITE_OTHER): Payer: Medicare Other

## 2014-04-17 DIAGNOSIS — I4891 Unspecified atrial fibrillation: Secondary | ICD-10-CM

## 2014-04-17 DIAGNOSIS — Z7901 Long term (current) use of anticoagulants: Secondary | ICD-10-CM

## 2014-04-17 DIAGNOSIS — I6350 Cerebral infarction due to unspecified occlusion or stenosis of unspecified cerebral artery: Secondary | ICD-10-CM

## 2014-04-17 LAB — POCT INR: INR: 2

## 2014-04-21 ENCOUNTER — Other Ambulatory Visit: Payer: Self-pay | Admitting: Pulmonary Disease

## 2014-04-28 ENCOUNTER — Telehealth: Payer: Self-pay | Admitting: Pulmonary Disease

## 2014-04-28 NOTE — Telephone Encounter (Signed)
Called pt. appt scheduled to get flu shot tomorrow at 3:15. Nothing further needed

## 2014-04-29 ENCOUNTER — Telehealth: Payer: Self-pay | Admitting: Pulmonary Disease

## 2014-04-29 ENCOUNTER — Ambulatory Visit (INDEPENDENT_AMBULATORY_CARE_PROVIDER_SITE_OTHER): Payer: Medicare Other

## 2014-04-29 DIAGNOSIS — Z23 Encounter for immunization: Secondary | ICD-10-CM

## 2014-04-29 NOTE — Telephone Encounter (Signed)
Per SN okay for pt to get Prevnar 13 since last PNA vaccine was 07/2009.  LMTCB x1 for [name]

## 2014-04-30 NOTE — Telephone Encounter (Signed)
lmtcb x2 

## 2014-05-01 NOTE — Telephone Encounter (Signed)
Called and spoke with pts daughter and she is aware of SN recs. Nothing further is needed.  

## 2014-05-12 ENCOUNTER — Ambulatory Visit: Payer: Medicare Other

## 2014-05-12 ENCOUNTER — Ambulatory Visit (INDEPENDENT_AMBULATORY_CARE_PROVIDER_SITE_OTHER): Payer: Medicare Other

## 2014-05-12 DIAGNOSIS — B351 Tinea unguium: Secondary | ICD-10-CM

## 2014-05-12 DIAGNOSIS — M79606 Pain in leg, unspecified: Secondary | ICD-10-CM

## 2014-05-12 NOTE — Progress Notes (Signed)
   Subjective:    Patient ID: [name] [name], male    DOB: [birth year], 80 y.o.   MRN: [id number]  HPI  Pt presents for nail debridement Review of Systems No new findings or systemic changes noted    Objective:   Physical Exam Neurovascular status unchanged pedal pulses are palpable DP and PT one over 4 capillary refill time 4 seconds epicritic sensations diminished nails thick crumbly discolored friable 1 through 5 bilateral following debridement fourth right history with lumicain Neosporin and Band-Aid dressing.       Assessment & Plan:  Assessment this time onychomycosis painful mycotic nails with discoloration dystrophy and friability debrided return for future palliative nail care and as-needed basis painful mycotic nails debrided x10  Harriet Masson DPM

## 2014-05-18 ENCOUNTER — Telehealth: Payer: Self-pay | Admitting: Pulmonary Disease

## 2014-05-18 NOTE — Telephone Encounter (Signed)
Spoke with

## 2014-05-18 NOTE — Telephone Encounter (Signed)
Spoke with patient's sister [name]); she will have patient come by tomorrow at 11:30am to get his Prevnar 20. Pt has been added to the injection schedule; I will given injection when patient arrives. Nothing more needed at this time.

## 2014-05-19 ENCOUNTER — Ambulatory Visit (INDEPENDENT_AMBULATORY_CARE_PROVIDER_SITE_OTHER): Payer: Medicare Other

## 2014-05-19 DIAGNOSIS — Z23 Encounter for immunization: Secondary | ICD-10-CM

## 2014-05-20 ENCOUNTER — Other Ambulatory Visit: Payer: Self-pay | Admitting: Pulmonary Disease

## 2014-05-29 ENCOUNTER — Ambulatory Visit (INDEPENDENT_AMBULATORY_CARE_PROVIDER_SITE_OTHER): Payer: Medicare Other | Admitting: Pharmacist

## 2014-05-29 DIAGNOSIS — I639 Cerebral infarction, unspecified: Secondary | ICD-10-CM

## 2014-05-29 DIAGNOSIS — Z7901 Long term (current) use of anticoagulants: Secondary | ICD-10-CM

## 2014-05-29 DIAGNOSIS — I6350 Cerebral infarction due to unspecified occlusion or stenosis of unspecified cerebral artery: Secondary | ICD-10-CM

## 2014-05-29 DIAGNOSIS — I4891 Unspecified atrial fibrillation: Secondary | ICD-10-CM

## 2014-05-29 LAB — POCT INR: INR: 2.2

## 2014-06-27 ENCOUNTER — Other Ambulatory Visit: Payer: Self-pay | Admitting: Pulmonary Disease

## 2014-07-15 ENCOUNTER — Other Ambulatory Visit: Payer: Self-pay | Admitting: *Deleted

## 2014-07-15 MED ORDER — TORSEMIDE 20 MG PO TABS: ORAL_TABLET | ORAL | Status: DC

## 2014-07-15 MED ORDER — CLONIDINE HCL 0.2 MG PO TABS: 0.2000 mg | ORAL_TABLET | Freq: Two times a day (BID) | ORAL | Status: DC

## 2014-07-15 MED ORDER — METOPROLOL SUCCINATE ER 100 MG PO TB24: 100.0000 mg | ORAL_TABLET | Freq: Every day | ORAL | Status: DC

## 2014-07-22 ENCOUNTER — Other Ambulatory Visit: Payer: Self-pay | Admitting: *Deleted

## 2014-07-22 MED ORDER — VERAPAMIL HCL ER 120 MG PO TBCR: 120.0000 mg | EXTENDED_RELEASE_TABLET | Freq: Every day | ORAL | Status: DC

## 2014-07-24 ENCOUNTER — Telehealth: Payer: Self-pay

## 2014-07-24 ENCOUNTER — Ambulatory Visit (INDEPENDENT_AMBULATORY_CARE_PROVIDER_SITE_OTHER): Payer: Medicare Other | Admitting: Pharmacist

## 2014-07-24 DIAGNOSIS — Z7901 Long term (current) use of anticoagulants: Secondary | ICD-10-CM

## 2014-07-24 DIAGNOSIS — I639 Cerebral infarction, unspecified: Secondary | ICD-10-CM

## 2014-07-24 DIAGNOSIS — I6350 Cerebral infarction due to unspecified occlusion or stenosis of unspecified cerebral artery: Secondary | ICD-10-CM

## 2014-07-24 DIAGNOSIS — I4891 Unspecified atrial fibrillation: Secondary | ICD-10-CM

## 2014-07-24 LAB — POCT INR: INR: 2.3

## 2014-07-24 NOTE — Telephone Encounter (Signed)
-----   Message from Corky Sox sent at 07/21/2014  3:06 PM EST ----- Regarding: refill Contact: [phone] Pt needs refill on medication  please give pt a call

## 2014-07-29 ENCOUNTER — Telehealth: Payer: Self-pay

## 2014-07-29 ENCOUNTER — Telehealth: Payer: Self-pay | Admitting: Pulmonary Disease

## 2014-07-29 MED ORDER — ALLOPURINOL 100 MG PO TABS: ORAL_TABLET | ORAL | Status: DC

## 2014-07-29 MED ORDER — TORSEMIDE 20 MG PO TABS: ORAL_TABLET | ORAL | Status: DC

## 2014-07-29 MED ORDER — TELMISARTAN 80 MG PO TABS: ORAL_TABLET | ORAL | Status: DC

## 2014-07-29 MED ORDER — EPLERENONE 25 MG PO TABS: ORAL_TABLET | ORAL | Status: DC

## 2014-07-29 MED ORDER — CLONIDINE HCL 0.2 MG PO TABS: 0.2000 mg | ORAL_TABLET | Freq: Two times a day (BID) | ORAL | Status: DC

## 2014-07-29 MED ORDER — METOPROLOL SUCCINATE ER 100 MG PO TB24: 100.0000 mg | ORAL_TABLET | Freq: Every day | ORAL | Status: DC

## 2014-07-29 NOTE — Telephone Encounter (Signed)
Per SN--  Ok to send in refills of medication for the pt.  thanks

## 2014-07-29 NOTE — Telephone Encounter (Signed)
Called and spoke to pt's EC, [name]. Pt is needing refill on Metoprolol 100mg , Torsemide 20mg , Micardis 80mg , Clonidine 0.2mg , Inspra 25mg  and Allopurinol 100mg . Pt has not yet found a PCP. Pt was last seen on 01/08/14 by SN.   Dr. Lenna Gilford please advise if you are ok refilling these medications. Thanks.   Allergies  Allergen Reactions  . Ibuprofen     REACTION: causes low blood pressure    Current Outpatient Prescriptions on File Prior to Visit  Medication Sig Dispense Refill  . allopurinol (ZYLOPRIM) 100 MG tablet TAKE 1 TABLET BY MOUTH DAILY 30 tablet 6  . atorvastatin (LIPITOR) 20 MG tablet TAKE 1 TABLET BY MOUTH DAILY 30 tablet 6  . bimatoprost (LUMIGAN) 0.03 % ophthalmic solution Place 1 drop into both eyes at bedtime.      . brinzolamide (AZOPT) 1 % ophthalmic suspension Place 1 drop into both eyes 2 (two) times daily.      . Cholecalciferol (VITAMIN D) 2000 UNITS CAPS Take 1 capsule by mouth daily.      . cloNIDine (CATAPRES) 0.2 MG tablet Take 1 tablet (0.2 mg total) by mouth 2 (two) times daily. 60 tablet 0  . eplerenone (INSPRA) 25 MG tablet TAKE 1 TABLET BY MOUTH EVERY DAY 30 tablet 5  . meclizine (ANTIVERT) 25 MG tablet Take 1 tablet (25 mg total) by mouth 3 (three) times daily as needed. 50 tablet 5  . metoprolol succinate (TOPROL-XL) 100 MG 24 hr tablet Take 1 tablet (100 mg total) by mouth daily. Take with or immediately following a meal. 30 tablet 0  . Multiple Vitamins-Minerals (ICAPS) TABS Take 1 tablet by mouth 2 (two) times daily.      . pilocarpine (PILOCAR) 4 % ophthalmic solution 1 drop into left eye every 4 hrs as needed    . potassium chloride SA (K-DUR,KLOR-CON) 20 MEQ tablet TAKE 3 TABLETS BY MOUTH DAILY 270 tablet 2  . Tamsulosin HCl (FLOMAX) 0.4 MG CAPS Take 0.4 mg by mouth daily.      Marland Kitchen telmisartan (MICARDIS) 80 MG tablet TAKE 1 TABLET BY MOUTH DAILY 30 tablet 5  . torsemide (DEMADEX) 20 MG tablet TAKE 2 TABLETS BY MOUTH EVERY MORNING, THEN 1 TABLET EVERY EVENING  90 tablet 0  . verapamil (CALAN-SR) 120 MG CR tablet Take 1 tablet (120 mg total) by mouth at bedtime. 30 tablet 1  . warfarin (COUMADIN) 5 MG tablet Take as directed by coumadin clinic 90 tablet 1   No current facility-administered medications on file prior to visit.

## 2014-07-29 NOTE — Telephone Encounter (Signed)
Pt daughter called in needing refills for Metoprolo, torsemide, telmisartan, clonidine, allopurinol. I called to let daughter know these were filled by Dr. Lenna Gilford and she would need to contact that office for the refills. Let daughter know if she needed further assistance to call us back and we would try to help.

## 2014-07-29 NOTE — Telephone Encounter (Signed)
Meds sent in.  Pt's ec aware.  Nothing further needed.

## 2014-08-11 ENCOUNTER — Ambulatory Visit (INDEPENDENT_AMBULATORY_CARE_PROVIDER_SITE_OTHER): Payer: Medicare Other

## 2014-08-11 DIAGNOSIS — B351 Tinea unguium: Secondary | ICD-10-CM | POA: Diagnosis not present

## 2014-08-11 DIAGNOSIS — M79606 Pain in leg, unspecified: Secondary | ICD-10-CM

## 2014-08-11 NOTE — Progress Notes (Signed)
   Subjective:    Patient ID: [name] [name], male    DOB: 1926/04/23, 81 y.o.   MRN: [id number]  HPI Comments: "Need my toenails trimmed today"     Review of Systems no new findings or systemic changes noted     Objective:   Physical Exam  Neurovascular status unchanged pedal pulses DP and PT one over 4 Refill time 4 seconds all digits decreased sensation of the forefoot digits and arch nails thick brittle friable discolored and yellowed 1 through 5 bilateral. No open wounds no ulcers no secondary infection is noted current time.      Assessment & Plan:  Assessment onychomycosis painful mycotic nails debrided 1 through 5 bilateral return for future mycotic and palliative nail care in 3 months as recommended to activity change difficulties occur in the interim. Maintain appropriate accommodative shoes  Harriet Masson DPM

## 2014-08-15 DIAGNOSIS — I509 Heart failure, unspecified: Secondary | ICD-10-CM | POA: Diagnosis not present

## 2014-08-25 ENCOUNTER — Other Ambulatory Visit (HOSPITAL_COMMUNITY): Payer: Self-pay | Admitting: Cardiology

## 2014-08-25 DIAGNOSIS — I6523 Occlusion and stenosis of bilateral carotid arteries: Secondary | ICD-10-CM

## 2014-08-25 IMAGING — CR DG CHEST 2V
2 series · 2 of 2 positions shown · non-contrast
Comparison: 01/16/2012 and earlier studies.

CLINICAL DATA: Ex-smoker, hypertension.  No symptoms.

CHEST - 2 VIEW

[view not recorded (1 of 2)]
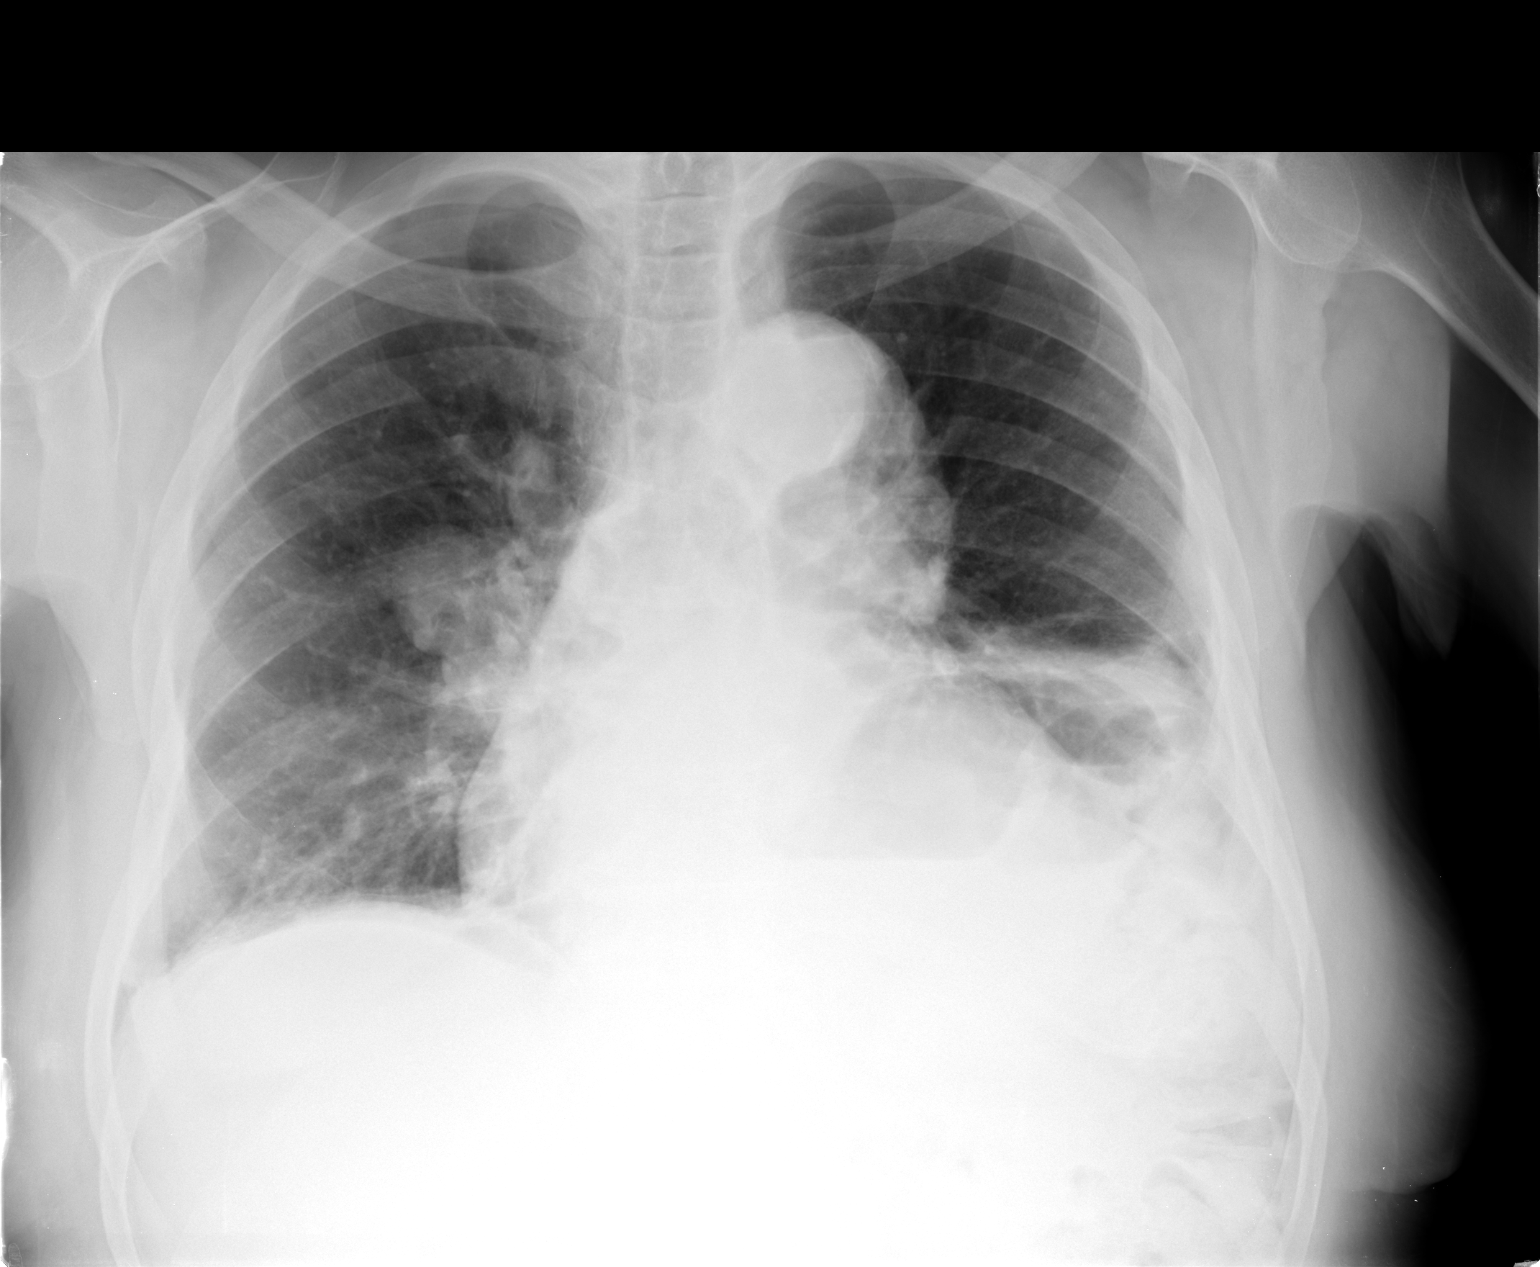

[view not recorded (2 of 2)]
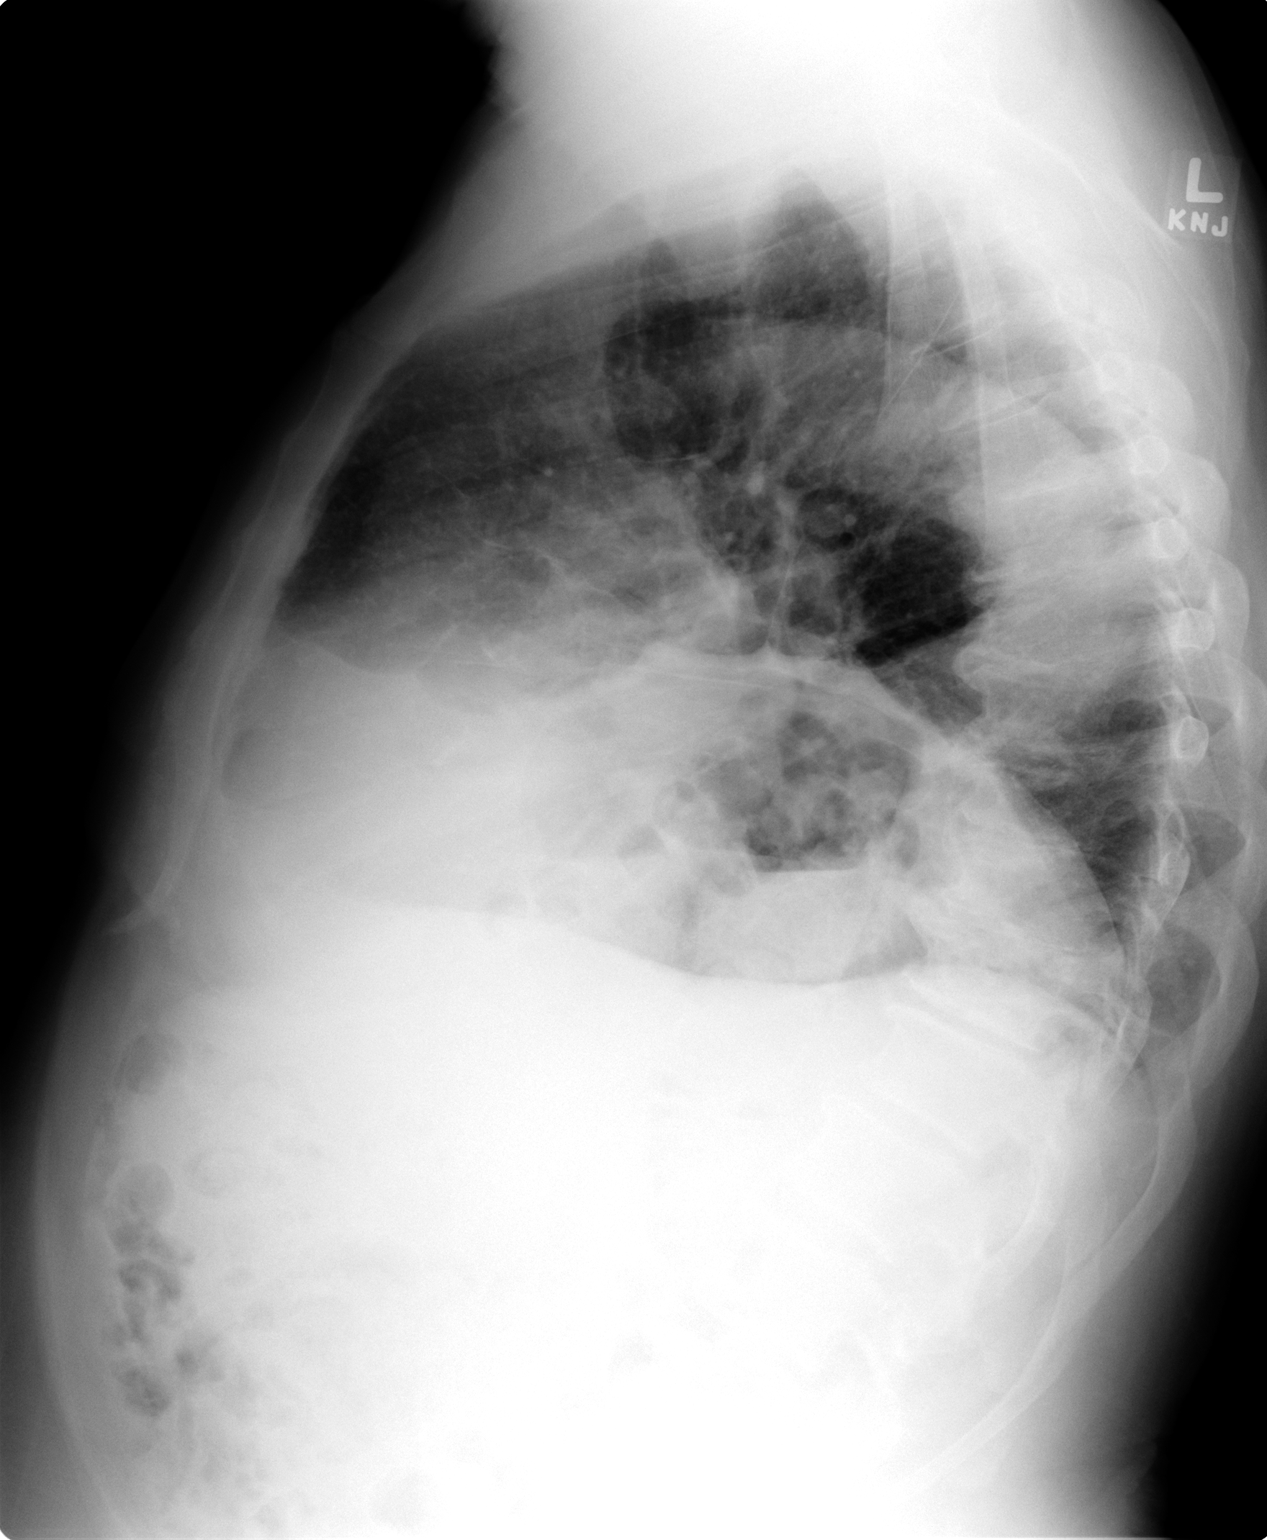

[2 of 2 positions shown; findings below may reference images not displayed]

FINDINGS: Borderline heart size with tortuous aorta.  Enlarged and
globular right pulmonary artery again noted and stable.  Elevated
left hemidiaphragm.  Minimal scarring or atelectasis at both lung
bases also stable.  No infiltrates, edema or masses.  No effusions
or pneumothoraces. There are no acute bony changes.
IMPRESSION: Stable chronic changes.  No acute findings.

## 2014-09-04 ENCOUNTER — Ambulatory Visit (HOSPITAL_COMMUNITY): Payer: Medicare Other | Attending: Internal Medicine | Admitting: Cardiology

## 2014-09-04 ENCOUNTER — Ambulatory Visit (INDEPENDENT_AMBULATORY_CARE_PROVIDER_SITE_OTHER): Payer: Medicare Other | Admitting: Pharmacist

## 2014-09-04 DIAGNOSIS — I4891 Unspecified atrial fibrillation: Secondary | ICD-10-CM

## 2014-09-04 DIAGNOSIS — I6523 Occlusion and stenosis of bilateral carotid arteries: Secondary | ICD-10-CM

## 2014-09-04 DIAGNOSIS — Z7901 Long term (current) use of anticoagulants: Secondary | ICD-10-CM

## 2014-09-04 DIAGNOSIS — E785 Hyperlipidemia, unspecified: Secondary | ICD-10-CM | POA: Diagnosis not present

## 2014-09-04 DIAGNOSIS — I639 Cerebral infarction, unspecified: Secondary | ICD-10-CM

## 2014-09-04 DIAGNOSIS — Z8673 Personal history of transient ischemic attack (TIA), and cerebral infarction without residual deficits: Secondary | ICD-10-CM | POA: Insufficient documentation

## 2014-09-04 DIAGNOSIS — I6350 Cerebral infarction due to unspecified occlusion or stenosis of unspecified cerebral artery: Secondary | ICD-10-CM

## 2014-09-04 DIAGNOSIS — Z87891 Personal history of nicotine dependence: Secondary | ICD-10-CM | POA: Diagnosis not present

## 2014-09-04 DIAGNOSIS — I10 Essential (primary) hypertension: Secondary | ICD-10-CM | POA: Insufficient documentation

## 2014-09-04 LAB — POCT INR: INR: 2.7

## 2014-09-04 NOTE — Progress Notes (Signed)
Carotid duplex performed 

## 2014-09-07 ENCOUNTER — Other Ambulatory Visit: Payer: Self-pay | Admitting: Cardiology

## 2014-09-07 NOTE — Telephone Encounter (Signed)
Rx refill sent to patient pharmacy   

## 2014-09-15 DIAGNOSIS — I509 Heart failure, unspecified: Secondary | ICD-10-CM | POA: Diagnosis not present

## 2014-09-23 ENCOUNTER — Other Ambulatory Visit: Payer: Self-pay

## 2014-09-23 MED ORDER — POTASSIUM CHLORIDE CRYS ER 20 MEQ PO TBCR: EXTENDED_RELEASE_TABLET | ORAL | Status: DC

## 2014-09-28 DIAGNOSIS — L2081 Atopic neurodermatitis: Secondary | ICD-10-CM | POA: Diagnosis not present

## 2014-10-14 DIAGNOSIS — I509 Heart failure, unspecified: Secondary | ICD-10-CM | POA: Diagnosis not present

## 2014-10-19 ENCOUNTER — Ambulatory Visit (INDEPENDENT_AMBULATORY_CARE_PROVIDER_SITE_OTHER): Payer: Medicare Other | Admitting: Pharmacist Clinician (PhC)/ Clinical Pharmacy Specialist

## 2014-10-19 ENCOUNTER — Ambulatory Visit (INDEPENDENT_AMBULATORY_CARE_PROVIDER_SITE_OTHER): Payer: Medicare Other | Admitting: Cardiology

## 2014-10-19 ENCOUNTER — Encounter: Payer: Self-pay | Admitting: Cardiology

## 2014-10-19 VITALS — BP 122/82 | HR 62 | Ht 71.0 in | Wt 187.0 lb

## 2014-10-19 DIAGNOSIS — I6350 Cerebral infarction due to unspecified occlusion or stenosis of unspecified cerebral artery: Secondary | ICD-10-CM

## 2014-10-19 DIAGNOSIS — I4891 Unspecified atrial fibrillation: Secondary | ICD-10-CM

## 2014-10-19 DIAGNOSIS — I639 Cerebral infarction, unspecified: Secondary | ICD-10-CM

## 2014-10-19 DIAGNOSIS — Z7901 Long term (current) use of anticoagulants: Secondary | ICD-10-CM | POA: Diagnosis not present

## 2014-10-19 LAB — POCT INR: INR: 3.3

## 2014-10-19 NOTE — Progress Notes (Signed)
HPI The patient presents as followup of his multiple cardiovascular problems. He has been doing relatively well.  He denies any acute symptoms such as chest pressure, neck or arm discomfort. He rarely feels palpitations. He has had no presyncope or syncope. He denies any new chest pressure, neck or arm discomfort. He gets around with a cane but is relatively sedentary.  He is limited by back problems and poor vision.  He does use oxygen at night.  He has some chronic dyspnea with activity but this has been stable.  He is not having PND or orthopnea.    Allergies  Allergen Reactions  . Ibuprofen     REACTION: causes low blood pressure    Current Outpatient Prescriptions  Medication Sig Dispense Refill  . allopurinol (ZYLOPRIM) 100 MG tablet TAKE 1 TABLET BY MOUTH DAILY 30 tablet 6  . atorvastatin (LIPITOR) 20 MG tablet TAKE 1 TABLET BY MOUTH DAILY 30 tablet 6  . bimatoprost (LUMIGAN) 0.03 % ophthalmic solution Place 1 drop into both eyes at bedtime.      . brinzolamide (AZOPT) 1 % ophthalmic suspension Place 1 drop into both eyes 2 (two) times daily.      . Cholecalciferol (VITAMIN D) 2000 UNITS CAPS Take 1 capsule by mouth daily.      . cloNIDine (CATAPRES) 0.2 MG tablet Take 1 tablet (0.2 mg total) by mouth 2 (two) times daily. 60 tablet 3  . eplerenone (INSPRA) 25 MG tablet TAKE 1 TABLET BY MOUTH EVERY DAY 30 tablet 5  . meclizine (ANTIVERT) 25 MG tablet Take 1 tablet (25 mg total) by mouth 3 (three) times daily as needed. 50 tablet 5  . metoprolol succinate (TOPROL-XL) 100 MG 24 hr tablet Take 1 tablet (100 mg total) by mouth daily. Take with or immediately following a meal. 30 tablet 5  . Multiple Vitamins-Minerals (ICAPS) TABS Take 1 tablet by mouth 2 (two) times daily.      . pilocarpine (PILOCAR) 4 % ophthalmic solution 1 drop into left eye every 4 hrs as needed    . potassium chloride SA (K-DUR,KLOR-CON) 20 MEQ tablet TAKE 3 TABLETS BY MOUTH DAILY 270 tablet 2  . Tamsulosin HCl  (FLOMAX) 0.4 MG CAPS Take 0.4 mg by mouth daily.      Marland Kitchen telmisartan (MICARDIS) 80 MG tablet TAKE 1 TABLET BY MOUTH DAILY 30 tablet 5  . torsemide (DEMADEX) 20 MG tablet TAKE 2 TABLETS BY MOUTH EVERY MORNING, THEN 1 TABLET EVERY EVENING 90 tablet 3  . verapamil (CALAN-SR) 120 MG CR tablet TAKE 1 TABLET BY MOUTH EVERY NIGHT AT BEDTIME 30 tablet 1  . warfarin (COUMADIN) 5 MG tablet Take as directed by coumadin clinic 90 tablet 1   No current facility-administered medications for this visit.    Past Medical History  Diagnosis Date  . Unspecified essential hypertension   . Congestive heart failure, unspecified   . Atrial fibrillation   . Aneurysm of pulmonary artery   . Pure hypercholesterolemia   . Unspecified hemorrhoids without mention of complication   . Unspecified cerebral artery occlusion with cerebral infarction   . Generalized osteoarthrosis, unspecified site   . Gout, unspecified   . Pulmonary hypertension   . Aortic stenosis   . Renal insufficiency   . BPH (benign prostatic hypertrophy)   . CVA (cerebral infarction)   . DJD (degenerative joint disease)     Past Surgical History  Procedure Laterality Date  . Appendectomy    . Inguinal hernia repair    .  Cataract extraction    . Cosmetic eye surgery      ROS:  As stated in the HPI and negative for all other systems.  PHYSICAL EXAM BP 122/82 mmHg  Pulse 62  Ht 5\' 11"  (1.803 m)  Wt 187 lb (84.823 kg)  BMI 26.09 kg/m2 GENERAL:  Well appearing HEENT:  Pupils equal round and reactive, fundi not visualized, oral mucosa unremarkable, upper denture NECK:  No jugular venous distention, waveform within normal limits, carotid upstroke brisk and symmetric, transmitted murmur vs bruit, no thyromegaly LUNGS:  Clear to auscultation bilaterally BACK:  No CVA tenderness CHEST:  Unremarkable HEART:  PMI not displaced or sustained,S1 and S2 within normal limits, no S3, no clicks, no rubs, apical mid peaking systolic murmur,  irregular ABD:  Flat, positive bowel sounds normal in frequency in pitch, no bruits, no rebound, no guarding, no midline pulsatile mass, no hepatomegaly, no splenomegaly EXT:  2 plus pulses throughout, no edema, no cyanosis no clubbing   EKG:  Atrial fibrillation, left axis deviation, rate 62, lateral T wave inversions, no change from previous. 10/19/2014  ASSESSMENT AND PLAN  AORTIC STENOSIS -  The patient has declined followup echo in the past. He wants conservative management. He has no new symptoms. No change in therapy is indicated.  ATRIAL FIBRILLATION -  The patient tolerates this rhythm and rate control and anticoagulation. He has no desire to switch to one of the newer agents.  He had his dose adjusted today.  HYPERTENSION -  The blood pressure is at target. No change in medications is indicated. We will continue with therapeutic lifestyle changes (TLC).  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

## 2014-10-19 NOTE — Patient Instructions (Signed)
Your physician recommends that you schedule a follow-up appointment in: one year with Dr. Hochrein  

## 2014-11-09 ENCOUNTER — Ambulatory Visit (INDEPENDENT_AMBULATORY_CARE_PROVIDER_SITE_OTHER): Payer: Medicare Other

## 2014-11-09 ENCOUNTER — Other Ambulatory Visit: Payer: Self-pay | Admitting: Cardiology

## 2014-11-09 DIAGNOSIS — I4891 Unspecified atrial fibrillation: Secondary | ICD-10-CM

## 2014-11-09 DIAGNOSIS — I6350 Cerebral infarction due to unspecified occlusion or stenosis of unspecified cerebral artery: Secondary | ICD-10-CM

## 2014-11-09 DIAGNOSIS — I639 Cerebral infarction, unspecified: Secondary | ICD-10-CM | POA: Diagnosis not present

## 2014-11-09 DIAGNOSIS — Z7901 Long term (current) use of anticoagulants: Secondary | ICD-10-CM

## 2014-11-09 LAB — POCT INR: INR: 2.8

## 2014-11-13 ENCOUNTER — Other Ambulatory Visit: Payer: Self-pay | Admitting: *Deleted

## 2014-11-13 MED ORDER — WARFARIN SODIUM 5 MG PO TABS: ORAL_TABLET | ORAL | Status: DC

## 2014-11-14 DIAGNOSIS — I509 Heart failure, unspecified: Secondary | ICD-10-CM | POA: Diagnosis not present

## 2014-11-23 ENCOUNTER — Other Ambulatory Visit: Payer: Self-pay | Admitting: Pulmonary Disease

## 2014-11-23 NOTE — Telephone Encounter (Signed)
Dr. Lenna Gilford  I tried calling patient to find out if he had found a pcp yet.  No answer Will you authorize additional refills on Catapress .2 mg? Last fill was in Dec 2015 with 3 additional refills.

## 2014-11-24 ENCOUNTER — Ambulatory Visit: Payer: Medicare Other

## 2014-11-27 ENCOUNTER — Ambulatory Visit: Payer: Medicare Other

## 2014-11-27 DIAGNOSIS — M79606 Pain in leg, unspecified: Secondary | ICD-10-CM

## 2014-11-27 DIAGNOSIS — B351 Tinea unguium: Secondary | ICD-10-CM

## 2014-11-27 NOTE — Progress Notes (Signed)
   Subjective:    Patient ID: [name] [name], male    DOB: [birth year], 81 y.o.   MRN: [id number]  HPI Comments: "Need my toenails trimmed today"     Review of Systems no new findings or systemic changes noted     Objective:   Physical Exam  Neurovascular status unchanged pedal pulses DP and PT one over 4 Refill time 4 seconds all digits decreased sensation of the forefoot digits and arch nails thick brittle friable discolored and yellowed 1 through 5 bilateral. No open wounds no ulcers no secondary infection is noted current time.      Assessment & Plan:  Assessment onychomycosis painful mycotic nails debrided 1 through 5 bilateral return for future mycotic and palliative nail care in 3 months as recommended to activity change difficulties occur in the interim. Maintain appropriate accommodative shoes

## 2014-12-02 ENCOUNTER — Other Ambulatory Visit: Payer: Self-pay | Admitting: Pulmonary Disease

## 2014-12-02 MED ORDER — CLONIDINE HCL 0.2 MG PO TABS: 0.2000 mg | ORAL_TABLET | Freq: Two times a day (BID) | ORAL | Status: DC

## 2014-12-02 NOTE — Telephone Encounter (Signed)
Refill request received from [facility] for refill on pt clonidine. Per SN, ok to refill. Request faxed back to [phone] with additional refills. Nothing further is needed

## 2014-12-14 ENCOUNTER — Ambulatory Visit (INDEPENDENT_AMBULATORY_CARE_PROVIDER_SITE_OTHER): Payer: Medicare Other | Admitting: *Deleted

## 2014-12-14 DIAGNOSIS — I639 Cerebral infarction, unspecified: Secondary | ICD-10-CM | POA: Diagnosis not present

## 2014-12-14 DIAGNOSIS — I509 Heart failure, unspecified: Secondary | ICD-10-CM | POA: Diagnosis not present

## 2014-12-14 DIAGNOSIS — Z7901 Long term (current) use of anticoagulants: Secondary | ICD-10-CM

## 2014-12-14 DIAGNOSIS — I4891 Unspecified atrial fibrillation: Secondary | ICD-10-CM | POA: Diagnosis not present

## 2014-12-14 DIAGNOSIS — I6350 Cerebral infarction due to unspecified occlusion or stenosis of unspecified cerebral artery: Secondary | ICD-10-CM

## 2014-12-14 LAB — POCT INR: INR: 3.1

## 2014-12-21 ENCOUNTER — Other Ambulatory Visit: Payer: Self-pay | Admitting: Pulmonary Disease

## 2014-12-28 ENCOUNTER — Other Ambulatory Visit: Payer: Self-pay | Admitting: Pulmonary Disease

## 2014-12-29 NOTE — Telephone Encounter (Signed)
Pt last seen in June 2015 Needs 6 mos f/u appt. Demedex refilled with 0 additional and note to schedule appt.

## 2015-01-09 ENCOUNTER — Other Ambulatory Visit: Payer: Self-pay | Admitting: Pulmonary Disease

## 2015-01-11 ENCOUNTER — Ambulatory Visit (INDEPENDENT_AMBULATORY_CARE_PROVIDER_SITE_OTHER): Payer: Medicare Other | Admitting: *Deleted

## 2015-01-11 DIAGNOSIS — I4891 Unspecified atrial fibrillation: Secondary | ICD-10-CM

## 2015-01-11 DIAGNOSIS — Z7901 Long term (current) use of anticoagulants: Secondary | ICD-10-CM

## 2015-01-11 DIAGNOSIS — I639 Cerebral infarction, unspecified: Secondary | ICD-10-CM | POA: Diagnosis not present

## 2015-01-11 DIAGNOSIS — I6350 Cerebral infarction due to unspecified occlusion or stenosis of unspecified cerebral artery: Secondary | ICD-10-CM

## 2015-01-11 LAB — POCT INR: INR: 3.2

## 2015-01-14 DIAGNOSIS — I509 Heart failure, unspecified: Secondary | ICD-10-CM | POA: Diagnosis not present

## 2015-01-18 ENCOUNTER — Other Ambulatory Visit: Payer: Self-pay | Admitting: Pulmonary Disease

## 2015-01-25 ENCOUNTER — Ambulatory Visit (INDEPENDENT_AMBULATORY_CARE_PROVIDER_SITE_OTHER): Payer: Medicare Other | Admitting: *Deleted

## 2015-01-25 ENCOUNTER — Other Ambulatory Visit: Payer: Self-pay | Admitting: Pulmonary Disease

## 2015-01-25 DIAGNOSIS — I639 Cerebral infarction, unspecified: Secondary | ICD-10-CM | POA: Diagnosis not present

## 2015-01-25 DIAGNOSIS — Z7901 Long term (current) use of anticoagulants: Secondary | ICD-10-CM

## 2015-01-25 DIAGNOSIS — I4891 Unspecified atrial fibrillation: Secondary | ICD-10-CM

## 2015-01-25 DIAGNOSIS — I6350 Cerebral infarction due to unspecified occlusion or stenosis of unspecified cerebral artery: Secondary | ICD-10-CM

## 2015-01-25 LAB — POCT INR: INR: 2.8

## 2015-02-01 ENCOUNTER — Other Ambulatory Visit: Payer: Self-pay | Admitting: Pulmonary Disease

## 2015-02-03 ENCOUNTER — Telehealth: Payer: Self-pay | Admitting: Pulmonary Disease

## 2015-02-03 ENCOUNTER — Other Ambulatory Visit: Payer: Self-pay | Admitting: Emergency Medicine

## 2015-02-03 NOTE — Telephone Encounter (Signed)
SN patient

## 2015-02-03 NOTE — Telephone Encounter (Signed)
I called made daughter aware she can try calling downstairs to primary care and see who is accepting new patients to schedule appt. She will do so. Nothing further needed

## 2015-02-08 ENCOUNTER — Other Ambulatory Visit: Payer: Self-pay | Admitting: Pulmonary Disease

## 2015-02-13 DIAGNOSIS — I509 Heart failure, unspecified: Secondary | ICD-10-CM | POA: Diagnosis not present

## 2015-02-14 ENCOUNTER — Other Ambulatory Visit: Payer: Self-pay | Admitting: Pulmonary Disease

## 2015-02-15 ENCOUNTER — Other Ambulatory Visit: Payer: Self-pay | Admitting: Pulmonary Disease

## 2015-02-15 ENCOUNTER — Ambulatory Visit (INDEPENDENT_AMBULATORY_CARE_PROVIDER_SITE_OTHER): Payer: Medicare Other | Admitting: *Deleted

## 2015-02-15 ENCOUNTER — Telehealth: Payer: Self-pay | Admitting: Pulmonary Disease

## 2015-02-15 DIAGNOSIS — I639 Cerebral infarction, unspecified: Secondary | ICD-10-CM

## 2015-02-15 DIAGNOSIS — I4891 Unspecified atrial fibrillation: Secondary | ICD-10-CM

## 2015-02-15 DIAGNOSIS — Z7901 Long term (current) use of anticoagulants: Secondary | ICD-10-CM

## 2015-02-15 DIAGNOSIS — I6350 Cerebral infarction due to unspecified occlusion or stenosis of unspecified cerebral artery: Secondary | ICD-10-CM

## 2015-02-15 LAB — POCT INR: INR: 2.5

## 2015-02-15 MED ORDER — CLONIDINE HCL 0.2 MG PO TABS: 0.2000 mg | ORAL_TABLET | Freq: Two times a day (BID) | ORAL | Status: DC

## 2015-02-15 MED ORDER — TORSEMIDE 20 MG PO TABS: ORAL_TABLET | ORAL | Status: DC

## 2015-02-15 MED ORDER — ALLOPURINOL 100 MG PO TABS: ORAL_TABLET | ORAL | Status: DC

## 2015-02-15 NOTE — Telephone Encounter (Signed)
Spoke with daughter. Aware rx's sent in. Nothing further needed

## 2015-02-25 ENCOUNTER — Encounter: Payer: Self-pay | Admitting: Podiatry

## 2015-02-25 ENCOUNTER — Ambulatory Visit (INDEPENDENT_AMBULATORY_CARE_PROVIDER_SITE_OTHER): Payer: Medicare Other | Admitting: Podiatry

## 2015-02-25 DIAGNOSIS — M79606 Pain in leg, unspecified: Secondary | ICD-10-CM

## 2015-02-25 DIAGNOSIS — B351 Tinea unguium: Secondary | ICD-10-CM | POA: Diagnosis not present

## 2015-02-25 NOTE — Progress Notes (Signed)
Patient ID: [name] [name], male   DOB: 06-16-1926, 81 y.o.   MRN: [id number]  Complaint:  Visit Type: Patient returns to my office for continued preventative foot care services. Complaint: Patient states" my nails have grown long and thick and become painful to walk and wear shoes"  The patient presents for preventative foot care services. No changes to ROS  Podiatric Exam: Vascular: dorsalis pedis and posterior tibial pulses are palpable bilateral. Capillary return is immediate. Temperature gradient is WNL. Skin turgor WNL  Sensorium: Normal Semmes Weinstein monofilament test. Normal tactile sensation bilaterally. Nail Exam: Pt has thick disfigured discolored nails with subungual debris noted bilateral entire nail hallux through fifth toenails Ulcer Exam: There is no evidence of ulcer or pre-ulcerative changes or infection. Orthopedic Exam: Muscle tone and strength are WNL. No limitations in general ROM. No crepitus or effusions noted. Foot type and digits show no abnormalities. Bony prominences are unremarkable. Skin: No Porokeratosis. No infection or ulcers  Diagnosis:  Onychomycosis, , Pain in right toe, pain in left toes  Treatment & Plan Procedures and Treatment: Consent by patient was obtained for treatment procedures. The patient understood the discussion of treatment and procedures well. All questions were answered thoroughly reviewed. Debridement of mycotic and hypertrophic toenails, 1 through 5 bilateral and clearing of subungual debris. No ulceration, no infection noted.  Return Visit-Office Procedure: Patient instructed to return to the office for a follow up visit 3 months for continued evaluation and treatment.

## 2015-03-01 DIAGNOSIS — H26493 Other secondary cataract, bilateral: Secondary | ICD-10-CM | POA: Diagnosis not present

## 2015-03-01 DIAGNOSIS — H4011X3 Primary open-angle glaucoma, severe stage: Secondary | ICD-10-CM | POA: Diagnosis not present

## 2015-03-01 DIAGNOSIS — H548 Legal blindness, as defined in USA: Secondary | ICD-10-CM | POA: Diagnosis not present

## 2015-03-02 ENCOUNTER — Other Ambulatory Visit: Payer: Self-pay | Admitting: *Deleted

## 2015-03-02 MED ORDER — WARFARIN SODIUM 5 MG PO TABS: ORAL_TABLET | ORAL | Status: DC

## 2015-03-04 ENCOUNTER — Ambulatory Visit (INDEPENDENT_AMBULATORY_CARE_PROVIDER_SITE_OTHER): Payer: Medicare Other | Admitting: Family

## 2015-03-04 ENCOUNTER — Other Ambulatory Visit (INDEPENDENT_AMBULATORY_CARE_PROVIDER_SITE_OTHER): Payer: Medicare Other

## 2015-03-04 ENCOUNTER — Encounter: Payer: Self-pay | Admitting: Family

## 2015-03-04 VITALS — BP 118/74 | HR 64 | Temp 97.6°F | Resp 18 | Ht 71.0 in | Wt 189.0 lb

## 2015-03-04 DIAGNOSIS — R42 Dizziness and giddiness: Secondary | ICD-10-CM | POA: Diagnosis not present

## 2015-03-04 LAB — BASIC METABOLIC PANEL
BUN: 26 mg/dL — ABNORMAL HIGH (ref 6–23)
CALCIUM: 9.4 mg/dL (ref 8.4–10.5)
CO2: 32 mEq/L (ref 19–32)
Chloride: 102 mEq/L (ref 96–112)
Creatinine, Ser: 1.59 mg/dL — ABNORMAL HIGH (ref 0.40–1.50)
GFR: 53.16 mL/min — ABNORMAL LOW (ref >60.00)
Glucose, Bld: 88 mg/dL (ref 70–99)
Potassium: 3.9 mEq/L (ref 3.5–5.1)
Sodium: 140 mEq/L (ref 135–145)

## 2015-03-04 NOTE — Patient Instructions (Signed)
Thank you for choosing [facility].  Summary/Instructions:  Your prescription(s) have been submitted to your pharmacy or been printed and provided for you. Please take as directed and contact our office if you believe you are having problem(s) with the medication(s) or have any questions.  Please stop by the lab on the basement level of the building for your blood work. Your results will be released to MyChart (or called to you) after review, usually within 72 hours after test completion. If any changes need to be made, you will be notified at that same time.  If your symptoms worsen or fail to improve, please contact our office for further instruction, or in case of emergency go directly to the emergency room at the closest medical facility.   Dizziness Dizziness is a common problem. It is a feeling of unsteadiness or light-headedness. You may feel like you are about to faint. Dizziness can lead to injury if you stumble or fall. A person of any age group can suffer from dizziness, but dizziness is more common in older adults. CAUSES  Dizziness can be caused by many different things, including:  Middle ear problems.  Standing for too long.  Infections.  An allergic reaction.  Aging.  An emotional response to something, such as the sight of blood.  Side effects of medicines.  Tiredness.  Problems with circulation or blood pressure.  Excessive use of alcohol or medicines, or illegal drug use.  Breathing too fast (hyperventilation).  An irregular heart rhythm (arrhythmia).  A low red blood cell count (anemia).  Pregnancy.  Vomiting, diarrhea, fever, or other illnesses that cause body fluid loss (dehydration).  Diseases or conditions such as Parkinson's disease, high blood pressure (hypertension), diabetes, and thyroid problems.  Exposure to extreme heat. DIAGNOSIS  Your health care provider will ask about your symptoms, perform a physical exam, and perform an  electrocardiogram (ECG) to record the electrical activity of your heart. Your health care provider may also perform other heart or blood tests to determine the cause of your dizziness. These may include:  Transthoracic echocardiogram (TTE). During echocardiography, sound waves are used to evaluate how blood flows through your heart.  Transesophageal echocardiogram (TEE).  Cardiac monitoring. This allows your health care provider to monitor your heart rate and rhythm in real time.  Holter monitor. This is a portable device that records your heartbeat and can help diagnose heart arrhythmias. It allows your health care provider to track your heart activity for several days if needed.  Stress tests by exercise or by giving medicine that makes the heart beat faster. TREATMENT  Treatment of dizziness depends on the cause of your symptoms and can vary greatly. HOME CARE INSTRUCTIONS   Drink enough fluids to keep your urine clear or pale yellow. This is especially important in very hot weather. In older adults, it is also important in cold weather.  Take your medicine exactly as directed if your dizziness is caused by medicines. When taking blood pressure medicines, it is especially important to get up slowly.  Rise slowly from chairs and steady yourself until you feel okay.  In the morning, first sit up on the side of the bed. When you feel okay, stand slowly while holding onto something until you know your balance is fine.  Move your legs often if you need to stand in one place for a long time. Tighten and relax your muscles in your legs while standing.  Have someone stay with you for 1-2 days if   dizziness continues to be a problem. Do this until you feel you are well enough to stay alone. Have the person call your health care provider if he or she notices changes in you that are concerning.  Do not drive or use heavy machinery if you feel dizzy.  Do not drink alcohol. SEEK IMMEDIATE MEDICAL  CARE IF:   Your dizziness or light-headedness gets worse.  You feel nauseous or vomit.  You have problems talking, walking, or using your arms, hands, or legs.  You feel weak.  You are not thinking clearly or you have trouble forming sentences. It may take a friend or family member to notice this.  You have chest pain, abdominal pain, shortness of breath, or sweating.  Your vision changes.  You notice any bleeding.  You have side effects from medicine that seems to be getting worse rather than better. MAKE SURE YOU:   Understand these instructions.  Will watch your condition.  Will get help right away if you are not doing well or get worse. Document Released: 01/17/2001 Document Revised: 07/29/2013 Document Reviewed: 02/10/2011 Exit[employer] Patient Information 2015 [employer]. This information is not intended to replace advice given to you by your health care provider. Make sure you discuss any questions you have with your health care provider.   

## 2015-03-04 NOTE — Progress Notes (Signed)
Pre visit review using our clinic review tool, if applicable. No additional management support is needed unless otherwise documented below in the visit note. 

## 2015-03-04 NOTE — Assessment & Plan Note (Signed)
Symptoms and exam consistent with lightheadedness most likely related to dehydration secondary to heat, decreased fluid intake, and torsemide. Encouraged to decrease caffeine intake and increase water intake. Goal would be light, straw colored urine. Obtain BMET to check electrolytes. Continue current medications as prescribed. Meclizine not likely to help. Change positions slowly. Follow up if symptoms worsen or do not improve with hydration.

## 2015-03-04 NOTE — Progress Notes (Signed)
Subjective:    Patient ID: [name] [name], male    DOB: 08-Mar-1926, 81 y.o.   MRN: [id number]  Chief Complaint  Patient presents with  . Establish Care    x2 days he has been feeling dizzy, when standing up he feels like going backwards, does not drink water,     HPI:  [name] [name] is a 81 y.o. male with a PMH of heart failure, hypertension, aortic stenosis, CVA, hemorrhoids, renal insufficiency. Hypercholesterolemia, and glaucoma who presents today for an office visit to establish care. His daughter [name] is present for today's visit and provides a portion on the history.  1.)  Dizziness - Associated symptom of dizziness has been going on for about 2 days. Describes the dizziness is described as a lightheadedness and feels like he is walking backwards. Modifying factors include an old prescription of meclizine which did not help very much. Currently drinks soda and tea with minimal amounts of water. Timing of the symptoms are intermittent and are worsened when changing positions. Urine is described as a dark yellow.  Allergies  Allergen Reactions  . Ibuprofen     REACTION: causes low blood pressure     Outpatient Prescriptions Prior to Visit  Medication Sig Dispense Refill  . allopurinol (ZYLOPRIM) 100 MG tablet TAKE 1 TABLET BY MOUTH DAILY 90 tablet 0  . atorvastatin (LIPITOR) 20 MG tablet TAKE 1 TABLET BY MOUTH DAILY 30 tablet 11  . bimatoprost (LUMIGAN) 0.03 % ophthalmic solution Place 1 drop into both eyes at bedtime.      . brinzolamide (AZOPT) 1 % ophthalmic suspension Place 1 drop into both eyes 2 (two) times daily.      . Cholecalciferol (VITAMIN D) 2000 UNITS CAPS Take 1 capsule by mouth daily.      . cloNIDine (CATAPRES) 0.2 MG tablet Take 1 tablet (0.2 mg total) by mouth 2 (two) times daily. 180 tablet 0  . eplerenone (INSPRA) 25  MG tablet TAKE 1 TABLET BY MOUTH EVERY DAY 90 tablet 2  . meclizine (ANTIVERT) 25 MG tablet Take 1 tablet (25 mg total) by mouth 3 (three) times daily as needed. 50 tablet 5  . metoprolol succinate (TOPROL-XL) 100 MG 24 hr tablet Take 1 tablet (100 mg total) by mouth daily. Take with or immediately following a meal. 30 tablet 5  . pilocarpine (PILOCAR) 4 % ophthalmic solution 1 drop into left eye every 4 hrs as needed    . potassium chloride SA (K-DUR,KLOR-CON) 20 MEQ tablet TAKE 3 TABLETS BY MOUTH DAILY 270 tablet 2  . Tamsulosin HCl (FLOMAX) 0.4 MG CAPS Take 0.4 mg by mouth daily.      Marland Kitchen telmisartan (MICARDIS) 80 MG tablet TAKE 1 TABLET BY MOUTH DAILY 30 tablet 5  . torsemide (DEMADEX) 20 MG tablet TAKE 2 TABLETS BY MOUTH EVERY MORNING AND TAKE 1 TABLET BY MOUTH EVERY EVENING 270 tablet 1  . verapamil (CALAN-SR) 120 MG CR tablet TAKE 1 TABLET BY MOUTH EVERY NIGHT AT BEDTIME 30 tablet 11  . warfarin (COUMADIN) 5 MG tablet Take as directed by coumadin clinic 90 tablet 1  . cloNIDine (CATAPRES) 0.2 MG tablet TAKE 1 TABLET BY MOUTH TWICE DAILY 60 tablet 0  . Multiple Vitamins-Minerals (ICAPS) TABS Take 1 tablet by mouth 2 (two) times daily.      Marland Kitchen telmisartan (MICARDIS) 80 MG tablet TAKE 1 TABLET BY MOUTH EVERY DAY 30 tablet 3   No facility-administered medications prior to visit.     Past  Medical History  Diagnosis Date  . Unspecified essential hypertension   . Congestive heart failure, unspecified   . Atrial fibrillation   . Aneurysm of pulmonary artery   . Pure hypercholesterolemia   . Unspecified hemorrhoids without mention of complication   . Unspecified cerebral artery occlusion with cerebral infarction   . Generalized osteoarthrosis, unspecified site   . Gout, unspecified   . Pulmonary hypertension   . Aortic stenosis   . BPH (benign prostatic hypertrophy)   . CVA (cerebral infarction)   . DJD (degenerative joint disease)   . Renal insufficiency      Past Surgical History    Procedure Laterality Date  . Appendectomy    . Inguinal hernia repair    . Cataract extraction    . Cosmetic eye surgery       History reviewed. No pertinent family history.   History   Social History  . Marital Status: Married    Spouse Name: N/A  . Number of Children: 5  . Years of Education: 10   Occupational History  . Retired    Social History Main Topics  . Smoking status: Former Smoker -- 3 years    Types: Cigarettes, Cigars    Quit date: 12/05/2003  . Smokeless tobacco: Never Used     Comment: does not smoke cigars anymore  . Alcohol Use: Not on file     Comment: rarely  . Drug Use: No  . Sexual Activity: Not on file   Other Topics Concern  . Not on file   Social History Narrative   Fun: Stay home; sitting on the porch   Denies religious beliefs effecting health care    Review of Systems  Constitutional: Negative for fever and chills.  Neurological: Positive for weakness and light-headedness.      Objective:    BP 118/74 mmHg  Pulse 64  Temp(Src) 97.6 F (36.4 C) (Oral)  Resp 18  Ht 5\' 11"  (1.803 m)  Wt 189 lb (85.73 kg)  BMI 26.37 kg/m2  SpO2 93% Nursing note and vital signs reviewed.  Physical Exam  Constitutional: He is oriented to person, place, and time. He appears well-developed and well-nourished. No distress.  Elderly male seated in a wheelchair, appears his stated age and is dressed appropriately for the situation.   Cardiovascular: Normal rate, regular rhythm, normal heart sounds and intact distal pulses.   Pulmonary/Chest: Effort normal and breath sounds normal.  Neurological: He is alert and oriented to person, place, and time.  Skin: Skin is warm and dry.  Skin turgor slow to return.   Psychiatric: He has a normal mood and affect. His behavior is normal. Judgment and thought content normal.       Assessment & Plan:   Problem List Items Addressed This Visit      Other   Lightheadedness - Primary    Symptoms and exam  consistent with lightheadedness most likely related to dehydration secondary to heat, decreased fluid intake, and torsemide. Encouraged to decrease caffeine intake and increase water intake. Goal would be light, straw colored urine. Obtain BMET to check electrolytes. Continue current medications as prescribed. Meclizine not likely to help. Change positions slowly. Follow up if symptoms worsen or do not improve with hydration.       Relevant Orders   Basic Metabolic Panel (BMET)

## 2015-03-05 ENCOUNTER — Encounter: Payer: Self-pay | Admitting: Family

## 2015-03-07 ENCOUNTER — Other Ambulatory Visit: Payer: Self-pay | Admitting: Pulmonary Disease

## 2015-03-08 ENCOUNTER — Other Ambulatory Visit: Payer: Self-pay | Admitting: Pulmonary Disease

## 2015-03-16 DIAGNOSIS — I509 Heart failure, unspecified: Secondary | ICD-10-CM | POA: Diagnosis not present

## 2015-03-23 ENCOUNTER — Ambulatory Visit (INDEPENDENT_AMBULATORY_CARE_PROVIDER_SITE_OTHER): Payer: Medicare Other | Admitting: *Deleted

## 2015-03-23 DIAGNOSIS — I639 Cerebral infarction, unspecified: Secondary | ICD-10-CM

## 2015-03-23 DIAGNOSIS — Z7901 Long term (current) use of anticoagulants: Secondary | ICD-10-CM

## 2015-03-23 DIAGNOSIS — I6350 Cerebral infarction due to unspecified occlusion or stenosis of unspecified cerebral artery: Secondary | ICD-10-CM

## 2015-03-23 DIAGNOSIS — I4891 Unspecified atrial fibrillation: Secondary | ICD-10-CM | POA: Diagnosis not present

## 2015-03-23 LAB — POCT INR: INR: 2.3

## 2015-04-07 DIAGNOSIS — H26493 Other secondary cataract, bilateral: Secondary | ICD-10-CM | POA: Diagnosis not present

## 2015-04-07 DIAGNOSIS — H4011X3 Primary open-angle glaucoma, severe stage: Secondary | ICD-10-CM | POA: Diagnosis not present

## 2015-04-16 DIAGNOSIS — I509 Heart failure, unspecified: Secondary | ICD-10-CM | POA: Diagnosis not present

## 2015-04-27 ENCOUNTER — Ambulatory Visit (INDEPENDENT_AMBULATORY_CARE_PROVIDER_SITE_OTHER): Payer: Medicare Other | Admitting: *Deleted

## 2015-04-27 DIAGNOSIS — Z7901 Long term (current) use of anticoagulants: Secondary | ICD-10-CM

## 2015-04-27 DIAGNOSIS — I4891 Unspecified atrial fibrillation: Secondary | ICD-10-CM | POA: Diagnosis not present

## 2015-04-27 DIAGNOSIS — I639 Cerebral infarction, unspecified: Secondary | ICD-10-CM | POA: Diagnosis not present

## 2015-04-27 DIAGNOSIS — I6350 Cerebral infarction due to unspecified occlusion or stenosis of unspecified cerebral artery: Secondary | ICD-10-CM

## 2015-04-27 LAB — POCT INR: INR: 2.2

## 2015-05-03 ENCOUNTER — Other Ambulatory Visit: Payer: Self-pay | Admitting: Pulmonary Disease

## 2015-05-03 MED ORDER — ALLOPURINOL 100 MG PO TABS: ORAL_TABLET | ORAL | Status: DC

## 2015-05-16 DIAGNOSIS — I509 Heart failure, unspecified: Secondary | ICD-10-CM | POA: Diagnosis not present

## 2015-05-25 ENCOUNTER — Other Ambulatory Visit: Payer: Self-pay | Admitting: Pulmonary Disease

## 2015-05-26 DIAGNOSIS — H26493 Other secondary cataract, bilateral: Secondary | ICD-10-CM | POA: Diagnosis not present

## 2015-05-26 NOTE — Telephone Encounter (Signed)
Received medication refill request for Telmisartan 80mg , Clonidine 0.2mg . Patient last seen 01/08/14 by SN. Pt has no pending appointment, called pt left message to call back to schedule appointment.   Please advise on refills / Drug to Drug interaction was  Noted on [place] stating it may cause hyperkalemia.   Allergies  Allergen Reactions  . Ibuprofen     REACTION: causes low blood pressure    Current Outpatient Prescriptions on File Prior to Visit  Medication Sig Dispense Refill  . allopurinol (ZYLOPRIM) 100 MG tablet TAKE 1 TABLET BY MOUTH DAILY 90 tablet 0  . atorvastatin (LIPITOR) 20 MG tablet TAKE 1 TABLET BY MOUTH DAILY 30 tablet 11  . bimatoprost (LUMIGAN) 0.03 % ophthalmic solution Place 1 drop into both eyes at bedtime.      . brinzolamide (AZOPT) 1 % ophthalmic suspension Place 1 drop into both eyes 2 (two) times daily.      . Cholecalciferol (VITAMIN D) 2000 UNITS CAPS Take 1 capsule by mouth daily.      . cloNIDine (CATAPRES) 0.2 MG tablet Take 1 tablet (0.2 mg total) by mouth 2 (two) times daily. 180 tablet 0  . eplerenone (INSPRA) 25 MG tablet TAKE 1 TABLET BY MOUTH EVERY DAY 90 tablet 2  . meclizine (ANTIVERT) 25 MG tablet Take 1 tablet (25 mg total) by mouth 3 (three) times daily as needed. 50 tablet 5  . metoprolol succinate (TOPROL-XL) 100 MG 24 hr tablet Take 1 tablet (100 mg total) by mouth daily. Take with or immediately following a meal. 30 tablet 5  . metoprolol succinate (TOPROL-XL) 100 MG 24 hr tablet TAKE 1 TABLET BY MOUTH DAILY. TAKE WITH OR IMMEDIATELY FOLLOWING A MEAL. 90 tablet 3  . pilocarpine (PILOCAR) 4 % ophthalmic solution 1 drop into left eye every 4 hrs as needed    . potassium chloride SA (K-DUR,KLOR-CON) 20 MEQ tablet TAKE 3 TABLETS BY MOUTH DAILY 270 tablet 2  . Tamsulosin HCl (FLOMAX) 0.4 MG CAPS Take 0.4 mg by mouth daily.      Marland Kitchen telmisartan (MICARDIS) 80 MG tablet TAKE 1 TABLET BY MOUTH DAILY 30 tablet 5  . torsemide (DEMADEX) 20 MG  tablet TAKE 2 TABLETS BY MOUTH EVERY MORNING AND TAKE 1 TABLET BY MOUTH EVERY EVENING 270 tablet 1  . verapamil (CALAN-SR) 120 MG CR tablet TAKE 1 TABLET BY MOUTH EVERY NIGHT AT BEDTIME 30 tablet 11  . warfarin (COUMADIN) 5 MG tablet Take as directed by coumadin clinic 90 tablet 1   No current facility-administered medications on file prior to visit.

## 2015-05-28 ENCOUNTER — Other Ambulatory Visit: Payer: Self-pay | Admitting: Family

## 2015-06-01 ENCOUNTER — Ambulatory Visit (INDEPENDENT_AMBULATORY_CARE_PROVIDER_SITE_OTHER): Payer: Medicare Other | Admitting: Podiatry

## 2015-06-01 ENCOUNTER — Encounter: Payer: Self-pay | Admitting: Podiatry

## 2015-06-01 ENCOUNTER — Other Ambulatory Visit: Payer: Self-pay

## 2015-06-01 ENCOUNTER — Other Ambulatory Visit: Payer: Self-pay | Admitting: Family

## 2015-06-01 DIAGNOSIS — B351 Tinea unguium: Secondary | ICD-10-CM | POA: Diagnosis not present

## 2015-06-01 DIAGNOSIS — M79606 Pain in leg, unspecified: Secondary | ICD-10-CM

## 2015-06-01 MED ORDER — TELMISARTAN 80 MG PO TABS: ORAL_TABLET | ORAL | Status: DC

## 2015-06-01 NOTE — Progress Notes (Signed)
Patient ID: [name] [name], male   DOB: 11/29/1925, 81 y.o.   MRN: [id number]  Complaint:  Visit Type: Patient returns to my office for continued preventative foot care services. Complaint: Patient states" my nails have grown long and thick and become painful to walk and wear shoes"  The patient presents for preventative foot care services. No changes to ROS  Podiatric Exam: Vascular: dorsalis pedis and posterior tibial pulses are palpable bilateral. Capillary return is immediate. Temperature gradient is WNL. Skin turgor WNL  Sensorium: Normal Semmes Weinstein monofilament test. Normal tactile sensation bilaterally. Nail Exam: Pt has thick disfigured discolored nails with subungual debris noted bilateral entire nail hallux through fifth toenails Ulcer Exam: There is no evidence of ulcer or pre-ulcerative changes or infection. Orthopedic Exam: Muscle tone and strength are WNL. No limitations in general ROM. No crepitus or effusions noted. Foot type and digits show no abnormalities. Bony prominences are unremarkable. Skin: No Porokeratosis. No infection or ulcers  Diagnosis:  Onychomycosis, , Pain in right toe, pain in left toes  Treatment & Plan Procedures and Treatment: Consent by patient was obtained for treatment procedures. The patient understood the discussion of treatment and procedures well. All questions were answered thoroughly reviewed. Debridement of mycotic and hypertrophic toenails, 1 through 5 bilateral and clearing of subungual debris. No ulceration, no infection noted.  Return Visit-Office Procedure: Patient instructed to return to the office for a follow up visit 3 months for continued evaluation and treatment. 

## 2015-06-08 ENCOUNTER — Ambulatory Visit (INDEPENDENT_AMBULATORY_CARE_PROVIDER_SITE_OTHER): Payer: Medicare Other | Admitting: Pharmacist

## 2015-06-08 DIAGNOSIS — I6350 Cerebral infarction due to unspecified occlusion or stenosis of unspecified cerebral artery: Secondary | ICD-10-CM | POA: Diagnosis not present

## 2015-06-08 DIAGNOSIS — Z7901 Long term (current) use of anticoagulants: Secondary | ICD-10-CM | POA: Diagnosis not present

## 2015-06-08 DIAGNOSIS — I4891 Unspecified atrial fibrillation: Secondary | ICD-10-CM

## 2015-06-08 LAB — POCT INR: INR: 2.7

## 2015-06-15 ENCOUNTER — Encounter: Payer: Self-pay | Admitting: Family

## 2015-06-15 ENCOUNTER — Ambulatory Visit (INDEPENDENT_AMBULATORY_CARE_PROVIDER_SITE_OTHER): Payer: Medicare Other | Admitting: Family

## 2015-06-15 VITALS — BP 130/88 | HR 66 | Temp 97.4°F | Resp 16 | Ht 71.0 in | Wt 187.0 lb

## 2015-06-15 DIAGNOSIS — D170 Benign lipomatous neoplasm of skin and subcutaneous tissue of head, face and neck: Secondary | ICD-10-CM | POA: Diagnosis not present

## 2015-06-15 DIAGNOSIS — M545 Low back pain, unspecified: Secondary | ICD-10-CM

## 2015-06-15 DIAGNOSIS — M549 Dorsalgia, unspecified: Secondary | ICD-10-CM | POA: Insufficient documentation

## 2015-06-15 DIAGNOSIS — Z23 Encounter for immunization: Secondary | ICD-10-CM | POA: Diagnosis not present

## 2015-06-15 NOTE — Assessment & Plan Note (Signed)
Symptoms and exam consistent with a lipoma. Continue to monitor at this time. Follow up for changes in size or if pain develops.

## 2015-06-15 NOTE — Progress Notes (Signed)
Subjective:    Patient ID: [name] [name], male    DOB: 10/02/1925, 81 y.o.   MRN: [id number]  Chief Complaint  Patient presents with  . Follow-up    Back pain?    HPI:  [name] [name] is a 81 y.o. male who  has a past medical history of Unspecified essential hypertension; Congestive heart failure, unspecified; Atrial fibrillation ([place]); Aneurysm of pulmonary artery ([place]); Pure hypercholesterolemia; Unspecified hemorrhoids without mention of complication; Unspecified cerebral artery occlusion with cerebral infarction; Generalized osteoarthrosis, unspecified site; Gout, unspecified; Pulmonary hypertension ([place]); Aortic stenosis; BPH (benign prostatic hypertrophy); CVA (cerebral infarction); DJD (degenerative joint disease); and Renal insufficiency. and presents today for a follow up office visit.     1.) Back pain - Associated symptom of pain located in his back has been going on for about 6 months. Describes as a stiff feeling. Modifying factors include icy/hot and movement helps to relieve the symptoms. Tylenol has also helped. Denies any saddle anesthesia or changes of bowel or bladder movement.    2.) Knot on neck - Associated symptom of a knot located on the back of his neck has been going on for several years. Describes the occasional soreness in the back of his neck. Previously evaluated and informed nothing to be concerned about. Denies any changes in size over time. There is no evidence of infection.  Allergies  Allergen Reactions  . Ibuprofen     REACTION: causes low blood pressure     Current Outpatient Prescriptions on File Prior to Visit  Medication Sig Dispense Refill  . allopurinol (ZYLOPRIM) 100 MG tablet TAKE 1 TABLET BY MOUTH DAILY 90 tablet 0  . atorvastatin (LIPITOR) 20 MG tablet TAKE 1 TABLET BY MOUTH DAILY 30 tablet 11  . bimatoprost (LUMIGAN) 0.03 % ophthalmic solution Place 1 drop into both eyes at bedtime.      . brinzolamide (AZOPT) 1 % ophthalmic  suspension Place 1 drop into both eyes 2 (two) times daily.      . Cholecalciferol (VITAMIN D) 2000 UNITS CAPS Take 1 capsule by mouth daily.      . cloNIDine (CATAPRES) 0.2 MG tablet TAKE 1 TABLET(0.2 MG) BY MOUTH TWICE DAILY 180 tablet 0  . eplerenone (INSPRA) 25 MG tablet TAKE 1 TABLET BY MOUTH EVERY DAY 90 tablet 2  . meclizine (ANTIVERT) 25 MG tablet Take 1 tablet (25 mg total) by mouth 3 (three) times daily as needed. 50 tablet 5  . metoprolol succinate (TOPROL-XL) 100 MG 24 hr tablet Take 1 tablet (100 mg total) by mouth daily. Take with or immediately following a meal. 30 tablet 5  . metoprolol succinate (TOPROL-XL) 100 MG 24 hr tablet TAKE 1 TABLET BY MOUTH DAILY. TAKE WITH OR IMMEDIATELY FOLLOWING A MEAL. 90 tablet 3  . pilocarpine (PILOCAR) 4 % ophthalmic solution 1 drop into left eye every 4 hrs as needed    . potassium chloride SA (K-DUR,KLOR-CON) 20 MEQ tablet TAKE 3 TABLETS BY MOUTH DAILY 270 tablet 2  . Tamsulosin HCl (FLOMAX) 0.4 MG CAPS Take 0.4 mg by mouth daily.      Marland Kitchen telmisartan (MICARDIS) 80 MG tablet TAKE 1 TABLET BY MOUTH DAILY 90 tablet 1  . torsemide (DEMADEX) 20 MG tablet TAKE 2 TABLETS BY MOUTH EVERY MORNING AND TAKE 1 TABLET BY MOUTH EVERY EVENING 270 tablet 1  . verapamil (CALAN-SR) 120 MG CR tablet TAKE 1 TABLET BY MOUTH EVERY NIGHT AT BEDTIME 30 tablet 11  . warfarin (COUMADIN) 5 MG  tablet Take as directed by coumadin clinic 90 tablet 1   No current facility-administered medications on file prior to visit.     Past Surgical History  Procedure Laterality Date  . Appendectomy    . Inguinal hernia repair    . Cataract extraction    . Cosmetic eye surgery        Review of Systems  Constitutional: Negative for fever and chills.  Musculoskeletal: Positive for back pain.  Skin:       Positive for mass on neck.   Neurological: Negative for dizziness, weakness and numbness.      Objective:    BP 130/88 mmHg  Pulse 66  Temp(Src) 97.4 F (36.3 C) (Oral)   Resp 16  Ht 5\' 11"  (1.803 m)  Wt 187 lb (84.823 kg)  BMI 26.09 kg/m2  SpO2 98% Nursing note and vital signs reviewed.  Physical Exam  Constitutional: He is oriented to person, place, and time. He appears well-developed and well-nourished. No distress.  Neck:  4cm neck mass that is mobile, firm and nontender located on the right side of his neck.   Cardiovascular: Normal rate, regular rhythm, normal heart sounds and intact distal pulses.   Pulmonary/Chest: Effort normal and breath sounds normal.  Musculoskeletal:  No obvious deformity, discoloration, or edema. Mild tenderness elicited paraspinal musculature on bilateral sides and lumbar spine.  Neurological: He is alert and oriented to person, place, and time.  Skin: Skin is warm and dry.  Psychiatric: He has a normal mood and affect. His behavior is normal. Judgment and thought content normal.       Assessment & Plan:   Problem List Items Addressed This Visit      Musculoskeletal and Integument   Lipoma of neck - Primary    Symptoms and exam consistent with a lipoma. Continue to monitor at this time. Follow up for changes in size or if pain develops.        Other   Back pain    Symptoms and exam consistent with low back muscle tightness. Treat conservatively with heat, stretching and exercise. Recommend increasing walking and activity as tolerated. Follow up if symptoms worsen or fail to improve.

## 2015-06-15 NOTE — Assessment & Plan Note (Signed)
Symptoms and exam consistent with low back muscle tightness. Treat conservatively with heat, stretching and exercise. Recommend increasing walking and activity as tolerated. Follow up if symptoms worsen or fail to improve.

## 2015-06-15 NOTE — Progress Notes (Signed)
Pre visit review using our clinic review tool, if applicable. No additional management support is needed unless otherwise documented below in the visit note. 

## 2015-06-15 NOTE — Patient Instructions (Addendum)
Thank you for choosing [facility].  Summary/Instructions:  Your prescription(s) have been submitted to your pharmacy or been printed and provided for you. Please take as directed and contact our office if you believe you are having problem(s) with the medication(s) or have any questions.  If your symptoms worsen or fail to improve, please contact our office for further instruction, or in case of emergency go directly to the emergency room at the closest medical facility.   HEAT AND COLD  Cold treatment (icing) should be applied for 10 to 15 minutes every 2 to 3 hours for inflammation and pain, and immediately after activity that aggravates your symptoms. Use ice packs or an ice massage.  Heat treatment may be used before performing stretching and strengthening activities prescribed by your caregiver, physical therapist, or athletic trainer. Use a heat pack or a warm water soak. SEEK MEDICAL CARE IF:   Symptoms get worse or do not improve in 2 to 4 weeks, despite treatment.  You develop numbness, weakness, or loss of bowel or bladder function.  New, unexplained symptoms develop. (Drugs used in treatment may produce side effects.) EXERCISES  RANGE OF MOTION (ROM) AND STRETCHING EXERCISES - Low Back Strain Most people with lower back pain will find that their symptoms get worse with excessive bending forward (flexion) or arching at the lower back (extension). The exercises which will help resolve your symptoms will focus on the opposite motion.  Your physician, physical therapist or athletic trainer will help you determine which exercises will be most helpful to resolve your lower back pain. Do not complete any exercises without first consulting with your caregiver. Discontinue any exercises which make your symptoms worse until you speak to your caregiver.  If you have pain, numbness or tingling which travels down into your buttocks, leg or foot, the goal of the therapy is for these  symptoms to move closer to your back and eventually resolve. Sometimes, these leg symptoms will get better, but your lower back pain may worsen. This is typically an indication of progress in your rehabilitation. Be very alert to any changes in your symptoms and the activities in which you participated in the 24 hours prior to the change. Sharing this information with your caregiver will allow him/her to most efficiently treat your condition.  These exercises may help you when beginning to rehabilitate your injury. Your symptoms may resolve with or without further involvement from your physician, physical therapist or athletic trainer. While completing these exercises, remember:  Restoring tissue flexibility helps normal motion to return to the joints. This allows healthier, less painful movement and activity.  An effective stretch should be held for at least 30 seconds.  A stretch should never be painful. You should only feel a gentle lengthening or release in the stretched tissue. FLEXION RANGE OF MOTION AND STRETCHING EXERCISES: STRETCH - Flexion, Single Knee to Chest   Lie on a firm bed or floor with both legs extended in front of you.  Keeping one leg in contact with the floor, bring your opposite knee to your chest. Hold your leg in place by either grabbing behind your thigh or at your knee.  Pull until you feel a gentle stretch in your lower back. Hold __________ seconds.  Slowly release your grasp and repeat the exercise with the opposite side. Repeat __________ times. Complete this exercise __________ times per day.  STRETCH - Flexion, Double Knee to Chest   Lie on a firm bed or floor with both legs  extended in front of you.  Keeping one leg in contact with the floor, bring your opposite knee to your chest.  Tense your stomach muscles to support your back and then lift your other knee to your chest. Hold your legs in place by either grabbing behind your thighs or at your  knees.  Pull both knees toward your chest until you feel a gentle stretch in your lower back. Hold __________ seconds.  Tense your stomach muscles and slowly return one leg at a time to the floor. Repeat __________ times. Complete this exercise __________ times per day.  STRETCH - Low Trunk Rotation  Lie on a firm bed or floor. Keeping your legs in front of you, bend your knees so they are both pointed toward the ceiling and your feet are flat on the floor.  Extend your arms out to the side. This will stabilize your upper body by keeping your shoulders in contact with the floor.  Gently and slowly drop both knees together to one side until you feel a gentle stretch in your lower back. Hold for __________ seconds.  Tense your stomach muscles to support your lower back as you bring your knees back to the starting position. Repeat the exercise to the other side. Repeat __________ times. Complete this exercise __________ times per day  EXTENSION RANGE OF MOTION AND FLEXIBILITY EXERCISES: STRETCH - Extension, Prone on Elbows   Lie on your stomach on the floor, a bed will be too soft. Place your palms about shoulder width apart and at the height of your head.  Place your elbows under your shoulders. If this is too painful, stack pillows under your chest.  Allow your body to relax so that your hips drop lower and make contact more completely with the floor.  Hold this position for __________ seconds.  Slowly return to lying flat on the floor. Repeat __________ times. Complete this exercise __________ times per day.  RANGE OF MOTION - Extension, Prone Press Ups  Lie on your stomach on the floor, a bed will be too soft. Place your palms about shoulder width apart and at the height of your head.  Keeping your back as relaxed as possible, slowly straighten your elbows while keeping your hips on the floor. You may adjust the placement of your hands to maximize your comfort. As you gain motion,  your hands will come more underneath your shoulders.  Hold this position __________ seconds.  Slowly return to lying flat on the floor. Repeat __________ times. Complete this exercise __________ times per day.  RANGE OF MOTION- Quadruped, Neutral Spine   Assume a hands and knees position on a firm surface. Keep your hands under your shoulders and your knees under your hips. You may place padding under your knees for comfort.  Drop your head and point your tail bone toward the ground below you. This will round out your lower back like an angry cat. Hold this position for __________ seconds.  Slowly lift your head and release your tail bone so that your back sags into a large arch, like an old horse.  Hold this position for __________ seconds.  Repeat this until you feel limber in your lower back.  Now, find your "sweet spot." This will be the most comfortable position somewhere between the two previous positions. This is your neutral spine. Once you have found this position, tense your stomach muscles to support your lower back.  Hold this position for __________ seconds. Repeat __________ times. Complete  this exercise __________ times per day.  STRENGTHENING EXERCISES - Low Back Strain These exercises may help you when beginning to rehabilitate your injury. These exercises should be done near your "sweet spot." This is the neutral, low-back arch, somewhere between fully rounded and fully arched, that is your least painful position. When performed in this safe range of motion, these exercises can be used for people who have either a flexion or extension based injury. These exercises may resolve your symptoms with or without further involvement from your physician, physical therapist or athletic trainer. While completing these exercises, remember:   Muscles can gain both the endurance and the strength needed for everyday activities through controlled exercises.  Complete these exercises as  instructed by your physician, physical therapist or athletic trainer. Increase the resistance and repetitions only as guided.  You may experience muscle soreness or fatigue, but the pain or discomfort you are trying to eliminate should never worsen during these exercises. If this pain does worsen, stop and make certain you are following the directions exactly. If the pain is still present after adjustments, discontinue the exercise until you can discuss the trouble with your caregiver. STRENGTHENING - Deep Abdominals, Pelvic Tilt  Lie on a firm bed or floor. Keeping your legs in front of you, bend your knees so they are both pointed toward the ceiling and your feet are flat on the floor.  Tense your lower abdominal muscles to press your lower back into the floor. This motion will rotate your pelvis so that your tail bone is scooping upwards rather than pointing at your feet or into the floor.  With a gentle tension and even breathing, hold this position for __________ seconds. Repeat __________ times. Complete this exercise __________ times per day.  STRENGTHENING - Abdominals, Crunches   Lie on a firm bed or floor. Keeping your legs in front of you, bend your knees so they are both pointed toward the ceiling and your feet are flat on the floor. Cross your arms over your chest.  Slightly tip your chin down without bending your neck.  Tense your abdominals and slowly lift your trunk high enough to just clear your shoulder blades. Lifting higher can put excessive stress on the lower back and does not further strengthen your abdominal muscles.  Control your return to the starting position. Repeat __________ times. Complete this exercise __________ times per day.  STRENGTHENING - Quadruped, Opposite UE/LE Lift   Assume a hands and knees position on a firm surface. Keep your hands under your shoulders and your knees under your hips. You may place padding under your knees for comfort.  Find your  neutral spine and gently tense your abdominal muscles so that you can maintain this position. Your shoulders and hips should form a rectangle that is parallel with the floor and is not twisted.  Keeping your trunk steady, lift your right hand no higher than your shoulder and then your left leg no higher than your hip. Make sure you are not holding your breath. Hold this position __________ seconds.  Continuing to keep your abdominal muscles tense and your back steady, slowly return to your starting position. Repeat with the opposite arm and leg. Repeat __________ times. Complete this exercise __________ times per day.  STRENGTHENING - Lower Abdominals, Double Knee Lift  Lie on a firm bed or floor. Keeping your legs in front of you, bend your knees so they are both pointed toward the ceiling and your feet are flat on  the floor.  Tense your abdominal muscles to brace your lower back and slowly lift both of your knees until they come over your hips. Be certain not to hold your breath.  Hold __________ seconds. Using your abdominal muscles, return to the starting position in a slow and controlled manner. Repeat __________ times. Complete this exercise __________ times per day.  POSTURE AND BODY MECHANICS CONSIDERATIONS - Low Back Strain Keeping correct posture when sitting, standing or completing your activities will reduce the stress put on different body tissues, allowing injured tissues a chance to heal and limiting painful experiences. The following are general guidelines for improved posture. Your physician or physical therapist will provide you with any instructions specific to your needs. While reading these guidelines, remember:  The exercises prescribed by your provider will help you have the flexibility and strength to maintain correct postures.  The correct posture provides the best environment for your joints to work. All of your joints have less wear and tear when properly supported by a  spine with good posture. This means you will experience a healthier, less painful body.  Correct posture must be practiced with all of your activities, especially prolonged sitting and standing. Correct posture is as important when doing repetitive low-stress activities (typing) as it is when doing a single heavy-load activity (lifting). RESTING POSITIONS Consider which positions are most painful for you when choosing a resting position. If you have pain with flexion-based activities (sitting, bending, stooping, squatting), choose a position that allows you to rest in a less flexed posture. You would want to avoid curling into a fetal position on your side. If your pain worsens with extension-based activities (prolonged standing, working overhead), avoid resting in an extended position such as sleeping on your stomach. Most people will find more comfort when they rest with their spine in a more neutral position, neither too rounded nor too arched. Lying on a non-sagging bed on your side with a pillow between your knees, or on your back with a pillow under your knees will often provide some relief. Keep in mind, being in any one position for a prolonged period of time, no matter how correct your posture, can still lead to stiffness. PROPER SITTING POSTURE In order to minimize stress and discomfort on your spine, you must sit with correct posture. Sitting with good posture should be effortless for a healthy body. Returning to good posture is a gradual process. Many people can work toward this most comfortably by using various supports until they have the flexibility and strength to maintain this posture on their own. When sitting with proper posture, your ears will fall over your shoulders and your shoulders will fall over your hips. You should use the back of the chair to support your upper back. Your lower back will be in a neutral position, just slightly arched. You may place a small pillow or folded towel  at the base of your lower back for support.  When working at a desk, create an environment that supports good, upright posture. Without extra support, muscles tire, which leads to excessive strain on joints and other tissues. Keep these recommendations in mind: CHAIR:  A chair should be able to slide under your desk when your back makes contact with the back of the chair. This allows you to work closely.  The chair's height should allow your eyes to be level with the upper part of your monitor and your hands to be slightly lower than your elbows. BODY POSITION  Your feet should make contact with the floor. If this is not possible, use a foot rest.  Keep your ears over your shoulders. This will reduce stress on your neck and lower back. INCORRECT SITTING POSTURES  If you are feeling tired and unable to assume a healthy sitting posture, do not slouch or slump. This puts excessive strain on your back tissues, causing more damage and pain. Healthier options include:  Using more support, like a lumbar pillow.  Switching tasks to something that requires you to be upright or walking.  Talking a brief walk.  Lying down to rest in a neutral-spine position. PROLONGED STANDING WHILE SLIGHTLY LEANING FORWARD  When completing a task that requires you to lean forward while standing in one place for a long time, place either foot up on a stationary 2-4 inch high object to help maintain the best posture. When both feet are on the ground, the lower back tends to lose its slight inward curve. If this curve flattens (or becomes too large), then the back and your other joints will experience too much stress, tire more quickly, and can cause pain. CORRECT STANDING POSTURES Proper standing posture should be assumed with all daily activities, even if they only take a few moments, like when brushing your teeth. As in sitting, your ears should fall over your shoulders and your shoulders should fall over your hips.  You should keep a slight tension in your abdominal muscles to brace your spine. Your tailbone should point down to the ground, not behind your body, resulting in an over-extended swayback posture.  INCORRECT STANDING POSTURES  Common incorrect standing postures include a forward head, locked knees and/or an excessive swayback. WALKING Walk with an upright posture. Your ears, shoulders and hips should all line-up. PROLONGED ACTIVITY IN A FLEXED POSITION When completing a task that requires you to bend forward at your waist or lean over a low surface, try to find a way to stabilize 3 out of 4 of your limbs. You can place a hand or elbow on your thigh or rest a knee on the surface you are reaching across. This will provide you more stability so that your muscles do not fatigue as quickly. By keeping your knees relaxed, or slightly bent, you will also reduce stress across your lower back. CORRECT LIFTING TECHNIQUES DO :   Assume a wide stance. This will provide you more stability and the opportunity to get as close as possible to the object which you are lifting.  Tense your abdominals to brace your spine. Bend at the knees and hips. Keeping your back locked in a neutral-spine position, lift using your leg muscles. Lift with your legs, keeping your back straight.  Test the weight of unknown objects before attempting to lift them.  Try to keep your elbows locked down at your sides in order get the best strength from your shoulders when carrying an object.  Always ask for help when lifting heavy or awkward objects. INCORRECT LIFTING TECHNIQUES DO NOT:   Lock your knees when lifting, even if it is a small object.  Bend and twist. Pivot at your feet or move your feet when needing to change directions.  Assume that you can safely pick up even a paper clip without proper posture.   This information is not intended to replace advice given to you by your health care provider. Make sure you discuss  any questions you have with your health care provider.   Document Released: 07/24/2005 Document  Revised: 08/14/2014 Document Reviewed: 11/05/2008 Elsevier Interactive Patient Education 2016 [employer]  Lipoma A lipoma is a noncancerous (benign) tumor that is made up of fat cells. This is a very common type of soft-tissue growth. Lipomas are usually found under the skin (subcutaneous). They may occur in any tissue of the body that contains fat. Common areas for lipomas to appear include the back, shoulders, buttocks, and thighs. Lipomas grow slowly, and they are usually painless. Most lipomas do not cause problems and do not require treatment. CAUSES The cause of this condition is not known. RISK FACTORS This condition is more likely to develop in:  People who are 95-15 years old.  People who have a family history of lipomas. SYMPTOMS A lipoma usually appears as a small, round bump under the skin. It may feel soft or rubbery, but the firmness can vary. Most lipomas are not painful. However, a lipoma may become painful if it is located in an area where it pushes on nerves. DIAGNOSIS A lipoma can usually be diagnosed with a physical exam. You may also have tests to confirm the diagnosis and to rule out other conditions. Tests may include:  Imaging tests, such as a CT scan or MRI.  Removal of a tissue sample to be looked at under a microscope (biopsy). TREATMENT Treatment is not needed for small lipomas that are not causing problems. If a lipoma continues to get bigger or it causes problems, removal is often the best option. Lipomas can also be removed to improve appearance. Removal of a lipoma is usually done with a surgery in which the fatty cells and the surrounding capsule are removed. Most often, a medicine that numbs the area (local anesthetic) is used for this procedure. HOME CARE INSTRUCTIONS  Keep all follow-up visits as directed by your health care provider. This is  important. SEEK MEDICAL CARE IF:  Your lipoma becomes larger or hard.  Your lipoma becomes painful, red, or increasingly swollen. These could be signs of infection or a more serious condition.   This information is not intended to replace advice given to you by your health care provider. Make sure you discuss any questions you have with your health care provider.   Document Released: 07/14/2002 Document Revised: 12/08/2014 Document Reviewed: 07/20/2014 Elsevier Interactive Patient Education [employer].

## 2015-06-16 DIAGNOSIS — I509 Heart failure, unspecified: Secondary | ICD-10-CM | POA: Diagnosis not present

## 2015-06-22 ENCOUNTER — Other Ambulatory Visit: Payer: Self-pay | Admitting: Cardiology

## 2015-07-05 DIAGNOSIS — H401133 Primary open-angle glaucoma, bilateral, severe stage: Secondary | ICD-10-CM | POA: Diagnosis not present

## 2015-07-16 DIAGNOSIS — I509 Heart failure, unspecified: Secondary | ICD-10-CM | POA: Diagnosis not present

## 2015-07-20 ENCOUNTER — Ambulatory Visit (INDEPENDENT_AMBULATORY_CARE_PROVIDER_SITE_OTHER): Payer: Medicare Other | Admitting: *Deleted

## 2015-07-20 DIAGNOSIS — Z7901 Long term (current) use of anticoagulants: Secondary | ICD-10-CM | POA: Diagnosis not present

## 2015-07-20 DIAGNOSIS — I4891 Unspecified atrial fibrillation: Secondary | ICD-10-CM | POA: Diagnosis not present

## 2015-07-20 DIAGNOSIS — I6350 Cerebral infarction due to unspecified occlusion or stenosis of unspecified cerebral artery: Secondary | ICD-10-CM

## 2015-07-20 LAB — POCT INR: INR: 2.8

## 2015-07-27 ENCOUNTER — Other Ambulatory Visit: Payer: Self-pay | Admitting: Pulmonary Disease

## 2015-07-27 MED ORDER — ALLOPURINOL 100 MG PO TABS: ORAL_TABLET | ORAL | Status: DC

## 2015-07-27 NOTE — Telephone Encounter (Signed)
Received paper refill request to pt's allopurinol Per SN>> Ok to refill as previously rx'd  Refill sent electronically to pharmacy   Nothing further is needed

## 2015-08-16 DIAGNOSIS — I509 Heart failure, unspecified: Secondary | ICD-10-CM | POA: Diagnosis not present

## 2015-08-17 ENCOUNTER — Telehealth: Payer: Self-pay | Admitting: Cardiology

## 2015-08-17 ENCOUNTER — Other Ambulatory Visit: Payer: Self-pay | Admitting: Family

## 2015-08-17 NOTE — Telephone Encounter (Signed)
Spoke with pt, he has had SOB off and on for some time. He is having to use his oxygen more than usual. He reports SOB when he sits up, he will put his oxygen on and lie down and it gets better. He reports a little edema in his feet and legs. He requested to see dr hochrein. Follow up scheduled

## 2015-08-17 NOTE — Telephone Encounter (Signed)
New Message  Pt c/o Shortness Of Breath: STAT if SOB developed within the last 24 hours or pt is noticeably SOB on the phone  1. Are you currently SOB (can you hear that pt is SOB on the phone)? No at the moment but at times his breath gets shortened. He says he doesn't get any energy  2. How long have you been experiencing SOB? At least 1 week off and on. Anytime he tried to do something simple like walk to the bathroom. Etc.  3. Are you SOB when sitting or when up moving around? Both. When he sits up his breath gets short. At night especially when he is sleeping his heart beats low   4.  Are you currently experiencing any other symptoms? No other symptoms

## 2015-08-19 ENCOUNTER — Ambulatory Visit (INDEPENDENT_AMBULATORY_CARE_PROVIDER_SITE_OTHER): Payer: Medicare Other | Admitting: Cardiology

## 2015-08-19 ENCOUNTER — Encounter: Payer: Self-pay | Admitting: Cardiology

## 2015-08-19 VITALS — BP 150/100 | HR 89 | Ht 70.0 in | Wt 188.2 lb

## 2015-08-19 DIAGNOSIS — R0602 Shortness of breath: Secondary | ICD-10-CM | POA: Diagnosis not present

## 2015-08-19 NOTE — Progress Notes (Signed)
HPI The patient presents as followup of his multiple cardiovascular problems. He has been doing relatively well.  He denies any acute symptoms such as chest pressure, neck or arm discomfort. He does not feel palpitations. He has had no presyncope or syncope. He denies any new chest pressure, neck or arm discomfort. He gets around with a cane.  He is limited by back problems.  He does use oxygen at night.  He has some chronic dyspnea with activity but this has been stable.  He is not having PND or orthopnea.  He has been complaining of some mild ringing in his ears at times.    Allergies  Allergen Reactions  . Ibuprofen     REACTION: causes low blood pressure    Current Outpatient Prescriptions  Medication Sig Dispense Refill  . allopurinol (ZYLOPRIM) 100 MG tablet TAKE 1 TABLET BY MOUTH DAILY 90 tablet 0  . atorvastatin (LIPITOR) 20 MG tablet TAKE 1 TABLET BY MOUTH DAILY 30 tablet 11  . bimatoprost (LUMIGAN) 0.03 % ophthalmic solution Place 1 drop into both eyes at bedtime.      . brinzolamide (AZOPT) 1 % ophthalmic suspension Place 1 drop into both eyes 2 (two) times daily.      . Cholecalciferol (VITAMIN D) 2000 UNITS CAPS Take 1 capsule by mouth daily.      . cloNIDine (CATAPRES) 0.2 MG tablet TAKE 1 TABLET(0.2 MG) BY MOUTH TWICE DAILY 180 tablet 0  . eplerenone (INSPRA) 25 MG tablet TAKE 1 TABLET BY MOUTH EVERY DAY 90 tablet 2  . meclizine (ANTIVERT) 25 MG tablet Take 1 tablet (25 mg total) by mouth 3 (three) times daily as needed. 50 tablet 5  . metoprolol succinate (TOPROL-XL) 100 MG 24 hr tablet Take 1 tablet (100 mg total) by mouth daily. Take with or immediately following a meal. 30 tablet 5  . metoprolol succinate (TOPROL-XL) 100 MG 24 hr tablet TAKE 1 TABLET BY MOUTH DAILY. TAKE WITH OR IMMEDIATELY FOLLOWING A MEAL. 90 tablet 3  . pilocarpine (PILOCAR) 4 % ophthalmic solution 1 drop into left eye every 4 hrs as needed    . potassium chloride SA (K-DUR,KLOR-CON) 20 MEQ tablet TAKE 3  TABLETS BY MOUTH DAILY 270 tablet 1  . Tamsulosin HCl (FLOMAX) 0.4 MG CAPS Take 0.4 mg by mouth daily.      Marland Kitchen telmisartan (MICARDIS) 80 MG tablet TAKE 1 TABLET BY MOUTH DAILY 90 tablet 1  . torsemide (DEMADEX) 20 MG tablet TAKE 2 TABLETS BY MOUTH EVERY MORNING AND TAKE 1 TABLET BY MOUTH EVERY EVENING 270 tablet 1  . verapamil (CALAN-SR) 120 MG CR tablet TAKE 1 TABLET BY MOUTH EVERY NIGHT AT BEDTIME 30 tablet 11  . warfarin (COUMADIN) 5 MG tablet Take as directed by coumadin clinic 90 tablet 1   No current facility-administered medications for this visit.    Past Medical History  Diagnosis Date  . Unspecified essential hypertension   . Congestive heart failure, unspecified   . Atrial fibrillation ([place])   . 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 Pure hypercholesterolemia   . Unspecified hemorrhoids without mention of complication   . Unspecified cerebral artery occlusion with cerebral infarction   . Generalized osteoarthrosis, unspecified site   . Gout, unspecified   . Pulmonary hypertension ([place])   . Aortic stenosis   . BPH (benign prostatic hypertrophy)   . CVA (cerebral infarction)   . DJD (degenerative joint disease)   . Renal insufficiency     Past Surgical  History  Procedure Laterality Date  . Appendectomy    . Inguinal hernia repair    . Cataract extraction    . Cosmetic eye surgery      ROS:  As stated in the HPI and negative for all other systems.  PHYSICAL EXAM BP 150/100 mmHg  Pulse 89  Ht 5\' 10"  (1.778 m)  Wt 188 lb 3.2 oz (85.367 kg)  BMI 27.00 kg/m2 GENERAL:  Well appearing HEENT:  Pupils equal round and reactive, fundi not visualized, oral mucosa unremarkable, upper denture NECK:  No jugular venous distention, waveform within normal limits, carotid upstroke brisk and symmetric, transmitted murmur vs bruit, no thyromegaly LUNGS:  Clear to auscultation bilaterally BACK:  No CVA tenderness CHEST:  Unremarkable HEART:  PMI not displaced or sustained,S1 and  S2 within normal limits, no S3, no clicks, no rubs, apical mid peaking systolic murmur, irregular ABD:  Flat, positive bowel sounds normal in frequency in pitch, no bruits, no rebound, no guarding, no midline pulsatile mass, no hepatomegaly, no splenomegaly EXT:  2 plus pulses throughout, no edema, no cyanosis no clubbing   EKG:  Atrial fibrillation, left axis deviation, rate 89, lateral T wave inversions, no change from previous. 08/19/2015  ASSESSMENT AND PLAN  AORTIC STENOSIS -  The patient has declined followup echo in the past. He wants conservative management. He has no new symptoms. No change in therapy is indicated.  ATRIAL FIBRILLATION -  The patient tolerates this rhythm and rate control and anticoagulation. He has no desire to switch to one of the newer agents.  I will check a CBC and BMET today.    HYPERTENSION -  The blood pressure is at target. No change in medications is indicated. We will continue with therapeutic lifestyle changes (TLC).  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

## 2015-08-19 NOTE — Patient Instructions (Signed)
Medication Instructions:  Your physician recommends that you continue on your current medications as directed. Please refer to the Current Medication list given to you today.   Labwork: Your physician recommends that you return for lab work in: bmet and cbc   Testing/Procedures: none  Follow-Up: Your physician wants you to follow-up in: 12 months with Dr. Percival Spanish. You will receive a reminder letter in the mail two months in advance. If you don't receive a letter, please call our office to schedule the follow-up appointment.  Any Other Special Instructions Will Be Listed Below (If Applicable).     If you need a refill on your cardiac medications before your next appointment, please call your pharmacy.

## 2015-08-20 LAB — CBC WITH DIFFERENTIAL/PLATELET
BASOS ABS: 0.1 10*3/uL (ref 0.0–0.1)
Basophils Relative: 1 % (ref 0–1)
EOS ABS: 0.2 10*3/uL (ref 0.0–0.7)
EOS PCT: 4 % (ref 0–5)
HEMATOCRIT: 42.9 % (ref 39.0–52.0)
Hemoglobin: 14.5 g/dL (ref 13.0–17.0)
Lymphocytes Relative: 22 % (ref 12–46)
Lymphs Abs: 1.3 10*3/uL (ref 0.7–4.0)
MCH: 31.9 pg (ref 26.0–34.0)
MCHC: 33.8 g/dL (ref 30.0–36.0)
MCV: 94.3 fL (ref 78.0–100.0)
MONO ABS: 0.5 10*3/uL (ref 0.1–1.0)
MPV: 9.9 fL (ref 8.6–12.4)
Monocytes Relative: 8 % (ref 3–12)
Neutro Abs: 3.8 10*3/uL (ref 1.7–7.7)
Neutrophils Relative %: 65 % (ref 43–77)
PLATELETS: 232 10*3/uL (ref 150–400)
RBC: 4.55 MIL/uL (ref 4.22–5.81)
RDW: 15 % (ref 11.5–15.5)
WBC: 5.9 10*3/uL (ref 4.0–10.5)

## 2015-08-20 LAB — BASIC METABOLIC PANEL
BUN: 30 mg/dL — AB (ref 7–25)
CHLORIDE: 104 mmol/L (ref 98–110)
CO2: 26 mmol/L (ref 20–31)
CREATININE: 1.53 mg/dL — AB (ref 0.70–1.11)
Calcium: 10 mg/dL (ref 8.6–10.3)
Glucose, Bld: 95 mg/dL (ref 65–99)
POTASSIUM: 4.2 mmol/L (ref 3.5–5.3)
Sodium: 142 mmol/L (ref 135–146)

## 2015-09-06 ENCOUNTER — Ambulatory Visit (INDEPENDENT_AMBULATORY_CARE_PROVIDER_SITE_OTHER): Payer: Medicare Other | Admitting: *Deleted

## 2015-09-06 DIAGNOSIS — I6350 Cerebral infarction due to unspecified occlusion or stenosis of unspecified cerebral artery: Secondary | ICD-10-CM | POA: Diagnosis not present

## 2015-09-06 DIAGNOSIS — Z7901 Long term (current) use of anticoagulants: Secondary | ICD-10-CM | POA: Diagnosis not present

## 2015-09-06 DIAGNOSIS — I4891 Unspecified atrial fibrillation: Secondary | ICD-10-CM | POA: Diagnosis not present

## 2015-09-06 LAB — POCT INR: INR: 2.5

## 2015-09-07 ENCOUNTER — Ambulatory Visit (INDEPENDENT_AMBULATORY_CARE_PROVIDER_SITE_OTHER): Payer: Medicare Other | Admitting: Podiatry

## 2015-09-07 ENCOUNTER — Encounter: Payer: Self-pay | Admitting: Podiatry

## 2015-09-07 DIAGNOSIS — M79606 Pain in leg, unspecified: Secondary | ICD-10-CM

## 2015-09-07 DIAGNOSIS — B351 Tinea unguium: Secondary | ICD-10-CM

## 2015-09-07 NOTE — Progress Notes (Signed)
Patient ID: [name] [name], male   DOB: 03/04/1926, 82 y.o.   MRN: [id number]  Complaint:  Visit Type: Patient returns to my office for continued preventative foot care services. Complaint: Patient states" my nails have grown long and thick and become painful to walk and wear shoes"  The patient presents for preventative foot care services. No changes to ROS  Podiatric Exam: Vascular: dorsalis pedis and posterior tibial pulses are palpable bilateral. Capillary return is immediate. Temperature gradient is WNL. Skin turgor WNL  Sensorium: Normal Semmes Weinstein monofilament test. Normal tactile sensation bilaterally. Nail Exam: Pt has thick disfigured discolored nails with subungual debris noted bilateral entire nail hallux through fifth toenails Ulcer Exam: There is no evidence of ulcer or pre-ulcerative changes or infection. Orthopedic Exam: Muscle tone and strength are WNL. No limitations in general ROM. No crepitus or effusions noted. Foot type and digits show no abnormalities. Bony prominences are unremarkable. Skin: No Porokeratosis. No infection or ulcers  Diagnosis:  Onychomycosis, , Pain in right toe, pain in left toes  Treatment & Plan Procedures and Treatment: Consent by patient was obtained for treatment procedures. The patient understood the discussion of treatment and procedures well. All questions were answered thoroughly reviewed. Debridement of mycotic and hypertrophic toenails, 1 through 5 bilateral and clearing of subungual debris. No ulceration, no infection noted.  Return Visit-Office Procedure: Patient instructed to return to the office for a follow up visit 3 months for continued evaluation and treatment.   Alysen Smylie DPM 

## 2015-09-16 DIAGNOSIS — I509 Heart failure, unspecified: Secondary | ICD-10-CM | POA: Diagnosis not present

## 2015-10-02 ENCOUNTER — Other Ambulatory Visit: Payer: Self-pay | Admitting: Cardiology

## 2015-10-05 ENCOUNTER — Other Ambulatory Visit: Payer: Self-pay | Admitting: Pharmacist Clinician (PhC)/ Clinical Pharmacy Specialist

## 2015-10-05 MED ORDER — WARFARIN SODIUM 5 MG PO TABS: ORAL_TABLET | ORAL | Status: DC

## 2015-10-11 ENCOUNTER — Other Ambulatory Visit: Payer: Self-pay | Admitting: Pulmonary Disease

## 2015-10-14 DIAGNOSIS — I509 Heart failure, unspecified: Secondary | ICD-10-CM | POA: Diagnosis not present

## 2015-10-19 ENCOUNTER — Ambulatory Visit: Payer: Medicare Other | Admitting: Cardiology

## 2015-10-19 ENCOUNTER — Ambulatory Visit (INDEPENDENT_AMBULATORY_CARE_PROVIDER_SITE_OTHER): Payer: Medicare Other | Admitting: *Deleted

## 2015-10-19 DIAGNOSIS — I4891 Unspecified atrial fibrillation: Secondary | ICD-10-CM

## 2015-10-19 DIAGNOSIS — Z7901 Long term (current) use of anticoagulants: Secondary | ICD-10-CM | POA: Diagnosis not present

## 2015-10-19 DIAGNOSIS — I6350 Cerebral infarction due to unspecified occlusion or stenosis of unspecified cerebral artery: Secondary | ICD-10-CM

## 2015-10-19 LAB — POCT INR: INR: 3.1

## 2015-10-21 ENCOUNTER — Other Ambulatory Visit: Payer: Self-pay | Admitting: Family

## 2015-10-25 ENCOUNTER — Telehealth: Payer: Self-pay | Admitting: Pulmonary Disease

## 2015-10-25 NOTE — Telephone Encounter (Signed)
Pt not seen since 01/08/14 and a 37mo f/u was recommended. Please advise if ok to refill.   LOV 01/08/14 Next OV not scheduled  Allergies  Allergen Reactions  . Ibuprofen     REACTION: causes low blood pressure    Current Outpatient Prescriptions on File Prior to Visit  Medication Sig Dispense Refill  . allopurinol (ZYLOPRIM) 100 MG tablet TAKE 1 TABLET BY MOUTH DAILY 90 tablet 0  . atorvastatin (LIPITOR) 20 MG tablet TAKE 1 TABLET BY MOUTH DAILY 30 tablet 11  . bimatoprost (LUMIGAN) 0.03 % ophthalmic solution Place 1 drop into both eyes at bedtime.      . brinzolamide (AZOPT) 1 % ophthalmic suspension Place 1 drop into both eyes 2 (two) times daily.      . Cholecalciferol (VITAMIN D) 2000 UNITS CAPS Take 1 capsule by mouth daily.      . cloNIDine (CATAPRES) 0.2 MG tablet TAKE 1 TABLET(0.2 MG) BY MOUTH TWICE DAILY 180 tablet 0  . eplerenone (INSPRA) 25 MG tablet TAKE 1 TABLET BY MOUTH EVERY DAY 90 tablet 2  . meclizine (ANTIVERT) 25 MG tablet Take 1 tablet (25 mg total) by mouth 3 (three) times daily as needed. 50 tablet 5  . metoprolol succinate (TOPROL-XL) 100 MG 24 hr tablet Take 1 tablet (100 mg total) by mouth daily. Take with or immediately following a meal. 30 tablet 5  . metoprolol succinate (TOPROL-XL) 100 MG 24 hr tablet TAKE 1 TABLET BY MOUTH DAILY. TAKE WITH OR IMMEDIATELY FOLLOWING A MEAL. 90 tablet 3  . pilocarpine (PILOCAR) 4 % ophthalmic solution 1 drop into left eye every 4 hrs as needed    . potassium chloride SA (K-DUR,KLOR-CON) 20 MEQ tablet TAKE 3 TABLETS BY MOUTH DAILY 270 tablet 1  . Tamsulosin HCl (FLOMAX) 0.4 MG CAPS Take 0.4 mg by mouth daily.      Marland Kitchen telmisartan (MICARDIS) 80 MG tablet TAKE 1 TABLET BY MOUTH DAILY 90 tablet 1  . torsemide (DEMADEX) 20 MG tablet TAKE 2 TABLETS BY MOUTH EVERY MORNING AND TAKE 1 TABLET BY MOUTH EVERY EVENING 270 tablet 1  . torsemide (DEMADEX) 20 MG tablet Take 2 tablets by mouth every morning and 1 tablet by mouth in the  evening Overdue for yearly physical w/labs must see Marya Amsler for refills 90 tablet 0  . verapamil (CALAN-SR) 120 MG CR tablet TAKE 1 TABLET BY MOUTH EVERY NIGHT AT BEDTIME 30 tablet 11  . warfarin (COUMADIN) 5 MG tablet Take 1 tablet by mouth daily or as directed by coumadin clinic 90 tablet 1   No current facility-administered medications on file prior to visit.

## 2015-10-26 DIAGNOSIS — Z0000 Encounter for general adult medical examination without abnormal findings: Secondary | ICD-10-CM | POA: Diagnosis not present

## 2015-10-26 DIAGNOSIS — N401 Enlarged prostate with lower urinary tract symptoms: Secondary | ICD-10-CM | POA: Diagnosis not present

## 2015-10-26 DIAGNOSIS — N138 Other obstructive and reflux uropathy: Secondary | ICD-10-CM | POA: Diagnosis not present

## 2015-10-26 NOTE — Telephone Encounter (Signed)
Patient scheduled to see Dr. Lenna Gilford on 11/16/15. Patient aware of appointment. Nothing further needed.

## 2015-10-26 NOTE — Telephone Encounter (Signed)
Per SN:  Ok to make regular appt for pt. Thanks.

## 2015-10-26 NOTE — Telephone Encounter (Addendum)
Per Dr. Lenna Gilford:  Call patient, tell him that he needs to get his Allopurinol through his PCP, Terri Piedra, NP.  Last seen here was in 2015.  Send him my best please.  ------------------- Sent denial to pharmacy for Allopurinol. Called and spoke with patient.  Advised him that Allopurinol needs to be refilled by his PCP. Patient states that he wants to see Dr. Lenna Gilford again.  He requested to have him as PCP again, but advised him that Dr. Lenna Gilford does not do PCP anymore. Patient says that he wants to see Dr. Lenna Gilford for lungs then.  He says that he has a lot of mucus in his lungs and wants an appointment with Dr. Lenna Gilford.  Dr. Lenna Gilford, can patient be added to your schedule?  If so, would he have to be a Consult since he was formerly PCP, not Pulmonary?

## 2015-11-04 ENCOUNTER — Other Ambulatory Visit: Payer: Self-pay | Admitting: Pulmonary Disease

## 2015-11-04 NOTE — Telephone Encounter (Signed)
[name] [name], CMA at 10/26/2015 10:11 AM     Status: Addendum       Expand All Collapse All   Per Dr. Lenna Gilford:  Call patient, tell him that he needs to get his Allopurinol through his PCP, Terri Piedra, NP. Last seen here was in 2015. Send him my best please.

## 2015-11-05 ENCOUNTER — Other Ambulatory Visit: Payer: Self-pay | Admitting: Family

## 2015-11-14 DIAGNOSIS — I509 Heart failure, unspecified: Secondary | ICD-10-CM | POA: Diagnosis not present

## 2015-11-15 ENCOUNTER — Other Ambulatory Visit: Payer: Self-pay | Admitting: Family

## 2015-11-15 ENCOUNTER — Other Ambulatory Visit: Payer: Self-pay | Admitting: Cardiology

## 2015-11-15 NOTE — Telephone Encounter (Signed)
Rx request sent to pharmacy.  

## 2015-11-16 ENCOUNTER — Ambulatory Visit (INDEPENDENT_AMBULATORY_CARE_PROVIDER_SITE_OTHER)
Admission: RE | Admit: 2015-11-16 | Discharge: 2015-11-16 | Disposition: A | Discharge status: Home / Self Care | Payer: Medicare Other | Source: Ambulatory Visit | Attending: Pulmonary Disease | Admitting: Pulmonary Disease

## 2015-11-16 ENCOUNTER — Other Ambulatory Visit (INDEPENDENT_AMBULATORY_CARE_PROVIDER_SITE_OTHER): Payer: Medicare Other

## 2015-11-16 ENCOUNTER — Ambulatory Visit (INDEPENDENT_AMBULATORY_CARE_PROVIDER_SITE_OTHER): Payer: Medicare Other | Admitting: Pulmonary Disease

## 2015-11-16 ENCOUNTER — Encounter: Payer: Self-pay | Admitting: Pulmonary Disease

## 2015-11-16 VITALS — BP 112/82 | HR 87 | Temp 97.4°F | Ht 71.0 in | Wt 189.4 lb

## 2015-11-16 DIAGNOSIS — M109 Gout, unspecified: Secondary | ICD-10-CM

## 2015-11-16 DIAGNOSIS — I5032 Chronic diastolic (congestive) heart failure: Secondary | ICD-10-CM | POA: Diagnosis not present

## 2015-11-16 DIAGNOSIS — I4819 Other persistent atrial fibrillation: Secondary | ICD-10-CM

## 2015-11-16 DIAGNOSIS — R05 Cough: Secondary | ICD-10-CM | POA: Diagnosis not present

## 2015-11-16 DIAGNOSIS — I272 Other secondary pulmonary hypertension: Secondary | ICD-10-CM

## 2015-11-16 DIAGNOSIS — N259 Disorder resulting from impaired renal tubular function, unspecified: Secondary | ICD-10-CM

## 2015-11-16 DIAGNOSIS — I281 Aneurysm of pulmonary artery: Secondary | ICD-10-CM

## 2015-11-16 DIAGNOSIS — R0600 Dyspnea, unspecified: Secondary | ICD-10-CM | POA: Insufficient documentation

## 2015-11-16 DIAGNOSIS — I481 Persistent atrial fibrillation: Secondary | ICD-10-CM

## 2015-11-16 DIAGNOSIS — M159 Polyosteoarthritis, unspecified: Secondary | ICD-10-CM

## 2015-11-16 DIAGNOSIS — I10 Essential (primary) hypertension: Secondary | ICD-10-CM

## 2015-11-16 DIAGNOSIS — I359 Nonrheumatic aortic valve disorder, unspecified: Secondary | ICD-10-CM

## 2015-11-16 DIAGNOSIS — M150 Primary generalized (osteo)arthritis: Secondary | ICD-10-CM

## 2015-11-16 LAB — COMPREHENSIVE METABOLIC PANEL
ALT: 8 U/L (ref 0–53)
AST: 14 U/L (ref 0–37)
Albumin: 4.1 g/dL (ref 3.5–5.2)
Alkaline Phosphatase: 128 U/L — ABNORMAL HIGH (ref 39–117)
BUN: 38 mg/dL — ABNORMAL HIGH (ref 6–23)
CALCIUM: 10 mg/dL (ref 8.4–10.5)
CHLORIDE: 106 meq/L (ref 96–112)
CO2: 30 meq/L (ref 19–32)
CREATININE: 1.75 mg/dL — AB (ref 0.40–1.50)
GFR: 47.52 mL/min — AB (ref >60.00)
Glucose, Bld: 98 mg/dL (ref 70–99)
Potassium: 4.3 mEq/L (ref 3.5–5.1)
Sodium: 143 mEq/L (ref 135–145)
Total Bilirubin: 0.7 mg/dL (ref 0.2–1.2)
Total Protein: 7.9 g/dL (ref 6.0–8.3)

## 2015-11-16 LAB — CBC WITH DIFFERENTIAL/PLATELET
Basophils Absolute: 0.1 10*3/uL (ref 0.0–0.1)
Basophils Relative: 0.7 % (ref 0.0–3.0)
EOS PCT: 4.2 % (ref 0.0–5.0)
Eosinophils Absolute: 0.4 10*3/uL (ref 0.0–0.7)
HEMATOCRIT: 42.2 % (ref 39.0–52.0)
HEMOGLOBIN: 13.6 g/dL (ref 13.0–17.0)
LYMPHS PCT: 15.8 % (ref 12.0–46.0)
Lymphs Abs: 1.4 10*3/uL (ref 0.7–4.0)
MCHC: 32.3 g/dL (ref 30.0–36.0)
MCV: 99 fl (ref 78.0–100.0)
MONO ABS: 0.6 10*3/uL (ref 0.1–1.0)
Monocytes Relative: 7 % (ref 3.0–12.0)
Neutro Abs: 6.5 10*3/uL (ref 1.4–7.7)
Neutrophils Relative %: 72.3 % (ref 43.0–77.0)
Platelets: 267 10*3/uL (ref 150.0–400.0)
RBC: 4.26 Mil/uL (ref 4.22–5.81)
RDW: 17.1 % — ABNORMAL HIGH (ref 11.5–15.5)
WBC: 9 10*3/uL (ref 4.0–10.5)

## 2015-11-16 LAB — BRAIN NATRIURETIC PEPTIDE: PRO B NATRI PEPTIDE: 610 pg/mL — AB (ref 0.0–100.0)

## 2015-11-16 LAB — SEDIMENTATION RATE: SED RATE: 41 mm/h — AB (ref 0–22)

## 2015-11-16 LAB — TSH: TSH: 2.58 u[IU]/mL (ref 0.35–4.50)

## 2015-11-16 NOTE — Patient Instructions (Signed)
Today we updated your med list in our EPIC system...    Continue your current medications the same...  Today we checked a spirometry breathing test, an ambulatory oximetry test, a CXR, and some blood work...    We will contact you w/ the results when available...   Ideally we need to recheck your ECHOCARDIOGRAM as well to check the valves and pressure...    We will contact you w/ the results when available...   For the Mucus and shortness of breath >>    You need to gradually increase your activity level (EXERCISE)...    Concentrate on good deep breaths and "sniff" breaths to expand your lung bases...     Cough to try and clear any phlegm...  Use the OTC MUCINEX 600mg  tabs taking one tab 4 times daily w/ extra water intake by mouth...  Call for any questions...  Let's plan a follow up visit in 6-8 weeks, sooner if needed for problems.Marland KitchenMarland Kitchen

## 2015-11-22 ENCOUNTER — Other Ambulatory Visit: Payer: Self-pay | Admitting: Family

## 2015-11-23 ENCOUNTER — Other Ambulatory Visit: Payer: Self-pay | Admitting: Family

## 2015-11-23 ENCOUNTER — Ambulatory Visit (INDEPENDENT_AMBULATORY_CARE_PROVIDER_SITE_OTHER): Payer: Medicare Other | Admitting: *Deleted

## 2015-11-23 DIAGNOSIS — I6350 Cerebral infarction due to unspecified occlusion or stenosis of unspecified cerebral artery: Secondary | ICD-10-CM

## 2015-11-23 DIAGNOSIS — Z7901 Long term (current) use of anticoagulants: Secondary | ICD-10-CM

## 2015-11-23 DIAGNOSIS — I4891 Unspecified atrial fibrillation: Secondary | ICD-10-CM

## 2015-11-23 LAB — POCT INR: INR: 2.9

## 2015-12-03 DIAGNOSIS — H401133 Primary open-angle glaucoma, bilateral, severe stage: Secondary | ICD-10-CM | POA: Diagnosis not present

## 2015-12-06 ENCOUNTER — Ambulatory Visit (HOSPITAL_COMMUNITY): Payer: Medicare Other | Attending: Cardiovascular Disease

## 2015-12-06 ENCOUNTER — Other Ambulatory Visit: Payer: Self-pay

## 2015-12-06 DIAGNOSIS — I110 Hypertensive heart disease with heart failure: Secondary | ICD-10-CM | POA: Insufficient documentation

## 2015-12-06 DIAGNOSIS — I350 Nonrheumatic aortic (valve) stenosis: Secondary | ICD-10-CM | POA: Diagnosis not present

## 2015-12-06 DIAGNOSIS — I509 Heart failure, unspecified: Secondary | ICD-10-CM | POA: Diagnosis not present

## 2015-12-06 DIAGNOSIS — I071 Rheumatic tricuspid insufficiency: Secondary | ICD-10-CM | POA: Diagnosis not present

## 2015-12-06 DIAGNOSIS — E785 Hyperlipidemia, unspecified: Secondary | ICD-10-CM | POA: Diagnosis not present

## 2015-12-06 DIAGNOSIS — Z720 Tobacco use: Secondary | ICD-10-CM | POA: Insufficient documentation

## 2015-12-06 DIAGNOSIS — R0600 Dyspnea, unspecified: Secondary | ICD-10-CM | POA: Diagnosis not present

## 2015-12-06 DIAGNOSIS — I272 Other secondary pulmonary hypertension: Secondary | ICD-10-CM | POA: Diagnosis not present

## 2015-12-07 ENCOUNTER — Ambulatory Visit (INDEPENDENT_AMBULATORY_CARE_PROVIDER_SITE_OTHER): Payer: Medicare Other | Admitting: Podiatry

## 2015-12-07 ENCOUNTER — Encounter: Payer: Self-pay | Admitting: Podiatry

## 2015-12-07 DIAGNOSIS — B351 Tinea unguium: Secondary | ICD-10-CM

## 2015-12-07 DIAGNOSIS — M79606 Pain in leg, unspecified: Secondary | ICD-10-CM | POA: Diagnosis not present

## 2015-12-07 NOTE — Progress Notes (Signed)
Patient ID: [name] [name], male   DOB: [birth year], 82 y.o.   MRN: [id number]  Complaint:  Visit Type: Patient returns to my office for continued preventative foot care services. Complaint: Patient states" my nails have grown long and thick and become painful to walk and wear shoes"  The patient presents for preventative foot care services. No changes to ROS  Podiatric Exam: Vascular: dorsalis pedis and posterior tibial pulses are palpable bilateral. Capillary return is immediate. Temperature gradient is WNL. Skin turgor WNL  Sensorium: Normal Semmes Weinstein monofilament test. Normal tactile sensation bilaterally. Nail Exam: Pt has thick disfigured discolored nails with subungual debris noted bilateral entire nail hallux through fifth toenails Ulcer Exam: There is no evidence of ulcer or pre-ulcerative changes or infection. Orthopedic Exam: Muscle tone and strength are WNL. No limitations in general ROM. No crepitus or effusions noted. Foot type and digits show no abnormalities. Bony prominences are unremarkable. Skin: No Porokeratosis. No infection or ulcers  Diagnosis:  Onychomycosis, , Pain in right toe, pain in left toes  Treatment & Plan Procedures and Treatment: Consent by patient was obtained for treatment procedures. The patient understood the discussion of treatment and procedures well. All questions were answered thoroughly reviewed. Debridement of mycotic and hypertrophic toenails, 1 through 5 bilateral and clearing of subungual debris. No ulceration, no infection noted. Dermatitis feet B/L due to bleach usage. Return Visit-Office Procedure: Patient instructed to return to the office for a follow up visit 3 months for continued evaluation and treatment.   Gardiner Barefoot DPM

## 2015-12-08 NOTE — Progress Notes (Signed)
Quick Note:  Called and spoke to pt. Informed him of the results and recs per SN. Pt verbalized understanding and denied any further questions or concerns at this time.   ______ 

## 2015-12-14 DIAGNOSIS — I509 Heart failure, unspecified: Secondary | ICD-10-CM | POA: Diagnosis not present

## 2015-12-20 ENCOUNTER — Other Ambulatory Visit: Payer: Self-pay | Admitting: Cardiology

## 2015-12-20 ENCOUNTER — Other Ambulatory Visit: Payer: Self-pay | Admitting: Family

## 2015-12-20 NOTE — Telephone Encounter (Signed)
Rx(s) sent to pharmacy electronically.  

## 2015-12-30 ENCOUNTER — Ambulatory Visit: Payer: Medicare Other | Admitting: Pulmonary Disease

## 2015-12-31 ENCOUNTER — Ambulatory Visit: Payer: Medicare Other | Admitting: Family

## 2016-01-11 ENCOUNTER — Telehealth: Payer: Self-pay | Admitting: Pulmonary Disease

## 2016-01-11 ENCOUNTER — Ambulatory Visit (INDEPENDENT_AMBULATORY_CARE_PROVIDER_SITE_OTHER): Payer: Medicare Other

## 2016-01-11 DIAGNOSIS — I4891 Unspecified atrial fibrillation: Secondary | ICD-10-CM | POA: Diagnosis not present

## 2016-01-11 DIAGNOSIS — I6350 Cerebral infarction due to unspecified occlusion or stenosis of unspecified cerebral artery: Secondary | ICD-10-CM

## 2016-01-11 DIAGNOSIS — Z7901 Long term (current) use of anticoagulants: Secondary | ICD-10-CM | POA: Diagnosis not present

## 2016-01-11 LAB — POCT INR: INR: 2.8

## 2016-01-11 NOTE — Telephone Encounter (Signed)
Spoke with [name]-pt's daughter-she is aware of note added to pt's appt on 01-13-16 to seek evaluation for home health. Nothing more needed at this time.

## 2016-01-13 ENCOUNTER — Ambulatory Visit (INDEPENDENT_AMBULATORY_CARE_PROVIDER_SITE_OTHER): Payer: Medicare Other | Admitting: Pulmonary Disease

## 2016-01-13 ENCOUNTER — Encounter: Payer: Self-pay | Admitting: Pulmonary Disease

## 2016-01-13 ENCOUNTER — Telehealth (HOSPITAL_COMMUNITY): Payer: Self-pay | Admitting: Vascular Surgery

## 2016-01-13 VITALS — BP 138/78 | HR 72 | Temp 97.6°F | Ht 71.0 in | Wt 190.8 lb

## 2016-01-13 DIAGNOSIS — I281 Aneurysm of pulmonary artery: Secondary | ICD-10-CM | POA: Diagnosis not present

## 2016-01-13 DIAGNOSIS — I272 Other secondary pulmonary hypertension: Secondary | ICD-10-CM

## 2016-01-13 DIAGNOSIS — I10 Essential (primary) hypertension: Secondary | ICD-10-CM | POA: Diagnosis not present

## 2016-01-13 DIAGNOSIS — I481 Persistent atrial fibrillation: Secondary | ICD-10-CM

## 2016-01-13 DIAGNOSIS — M159 Polyosteoarthritis, unspecified: Secondary | ICD-10-CM

## 2016-01-13 DIAGNOSIS — I5032 Chronic diastolic (congestive) heart failure: Secondary | ICD-10-CM

## 2016-01-13 DIAGNOSIS — R0600 Dyspnea, unspecified: Secondary | ICD-10-CM

## 2016-01-13 DIAGNOSIS — I359 Nonrheumatic aortic valve disorder, unspecified: Secondary | ICD-10-CM

## 2016-01-13 DIAGNOSIS — N259 Disorder resulting from impaired renal tubular function, unspecified: Secondary | ICD-10-CM

## 2016-01-13 DIAGNOSIS — I4819 Other persistent atrial fibrillation: Secondary | ICD-10-CM

## 2016-01-13 DIAGNOSIS — M150 Primary generalized (osteo)arthritis: Secondary | ICD-10-CM

## 2016-01-13 NOTE — Progress Notes (Signed)
Subjective:    Patient ID: [name] [name], male    DOB: 1926-04-22, 82 y.o.   MRN: [id number]  HPI 82 y/o BM here for a follow up visit... he has multiple medical problems including HBP, CAD, AS, AFib, & CHF followed by DrHochrein & the CHFclinic;  known PulmHTN & PA aneurysm conservatively managed by DrGearhardt;  Hyperchol;  RI & BPH;  DJD & Gout... ~  SEE PREV EPIC NOTES FOR OLDER DATA >>   ~  July 16, 2012:  73moROV & Sakai reports worsening vision due to his Glaucoma- on eye drops & followed closely by Ophthalmology;  BP was recently elev at DrTannenbaum's office & we decided to increase his doses of Clonidine & Metoprolol meds...  We reviewed the following medical problems during today's office visit>>     HBP> on MetoprololER100-1/2 daily, CalanSR120, Micardis80, Clonidine0.2-1/2Bid, Demadex20-3/d, Epleronone25, K20-3/d; BP=144/92 & he has sl HA; we decided to incr his Metopto 1061md & the Clonidine0.2 to 1 tab Bid...    CHF> followed by DrHochrein for Cards- w/ severe Diastolic CHF, normal C's by cath in 2005, & normal LV systolic function w/ EFBD=53%notes DOE- no change.    AS> w/ severely calcif AoV leaflets & mod AS w/ 2525mean gradient (peak=39); pt desired conservative Rx, DrHochrein's notes are reviewed...    AFib> on Coumadin via CC; stable AFib on Coumadin w/ rate control...    Pulm Art aneurysm & PulmHTN> O2sat=94% on RA at rest & he has home O2 but just uses it "prn";  3 cm PA aneurysm followed by DrGearhardt without change x yrs...    CHOL> on Lip20; last FLP 6/12 showed TChol 162, TG 95, HDL 46, LDL 97; he doesn't seem to remember to come fasting for f/u labs...    Renal Insuffic, BPH> on Flomax0.4; baseline Creat= 1.5-1.6 range; followed by DrTannenbaum & managed conservatively...    DJD, Gout> on Tramadol, Allopurinol100, VitD2000; stable w/o specific complaints at present... We reviewed prob list, meds, xrays and labs> see below for updates >> he had the 2013 Flu  vaccine in Oct...   NOTE> pt did not go to the lab for his BMet & BNP as requested...  ADDENDUM: DrHochrein ordered CDopplers 12/13> mild heterogeneous plaque on right & mixed irreg plaque on left, 0-39% bilat ICA stenoses & antegrade vertebral flow; f/u 44yr13yr  ~  January 14, 2013:  63mo 79mo& Keyshun is stable- no new complaints or concerns; he has a sm lipoma on the right post neck area (pointed out by the HouseLevi Strausse for [employer]);[facility] reviewed the following medical problems during today's office visit >>     HBP> on MetoprololER100, CalanSR120, Micardis80, Clonidine0.2Bid, Demadex20-3/d, Epleronone25, K20-3/d; BP=130/80 & he is asked to monitor BP at home more closely; he denies CP, palpit, dizzy, ch in SOB/DOE, edema...    CHF> followed by DrHochrein for Cards- w/ severe Diastolic CHF, normal C's by cath in 2005, & normal LV systolic function w/ EF=75GD=92%n 1/14- stable, notes DOE- no change, pt refused f/u 2DEcho, rec conservative Rx...    AS> w/ severely calcif AoV leaflets & mod AS w/ 25mm 83m gradient (peak=39); last 2DEcho 10/12- pt desired conservative Rx, DrHochrein's notes are reviewed...    AFib> on Coumadin via CC; stable AFib on Coumadin w/ rate control...    Pulm Art aneurysm & PulmHTN> O2sat=94% on RA at rest & he has home O2 but just uses it "prn";  3cm PA aneurysm followed  by DrGearhardt without change x yrs...    CHOL> on Lip20; last FLP 6/12 showed TChol 162, TG 95, HDL 46, LDL 97; asked to ret for f/u FASTING labs    Renal Insuffic, BPH> on Flomax0.4; baseline Creat= 1.4-1.6 range; followed by DrTannenbaum & managed conservatively, last seen 10/13- note reviewed...    DJD, Gout> on Tramadol, Allopurinol100, VitD2000; stable w/o specific complaints at present... We reviewed prob list, meds, xrays and labs> see below for updates >>   CXR 6/14 showed borderline cardiomeg, extremely tort Ao, PA dilatation is the same (no change), elev left hemidiaph, scarring at bases,  NAD...  LABS 6/14:  Pending...  ~  July 15, 2013:  45moROV & Travion is stable, here w/ his daighter who confirms; as noted he is blind in left eye w/ poor vision on right as well (can't read or drive, can see some TV from a distance), hx Glaucoma Rx by DrBond; he has not been exercising much- has a lCivil Service fast streamernamed [name] & I suggested he get out & walk the dog w/ family help...  He has some minor somatic complaints- notes "ridges, wrinkles" on his scalp that seem to come & go, notes mild arthritis pain in back, VI/ edema in legs; we offered reassurance, discussed Aleve, Tylenol, no salt, elevation, support hose & continue current meds...    HBP> on MetoprololER100, CalanSR120, Micardis80, Clonidine0.2Bid, Demadex20-3/d, Epleronone25, K20-3/d; BP=132/78 & he denies CP, palpit, dizzy, ch in SOB/DOE, edema...    CHF> followed by DrHochrein for Cards- w/ severe Diastolic CHF, normal C's by cath in 2005, & normal LV systolic function w/ EQQ=76% seen 1/14- stable, notes DOE- no change, pt refused f/u 2DEcho, rec conservative Rx...    AS> w/ severely calcif AoV leaflets & mod AS w/ 2653mmean gradient (peak=39); last 2DEcho 10/12- pt desired conservative Rx, DrHochrein's notes are reviewed...    AFib> on Coumadin via CC; stable AFib on Coumadin w/ rate control from [place].    Pulm Art aneurysm & PulmHTN> O2sat=98% on RA at rest today & he has home O2 but just uses it "prn";  3cm PA aneurysm followed by DrGearhardt without change x yrs...    CHOL> on Lip20; last FLP 6/14 showed TChol 150, TG 89, HDL 51, LDL 81... Continue same rx...    Renal Insuffic, BPH> on Flomax0.4; baseline Creat= 1.4-1.6 range; followed by DrTannenbaum & managed conservatively, last seen 10/13- note reviewed...    DJD, Gout> on Allopurinol100, VitD2000, Aleve/ Tylenol; stable w/o specific complaints at present...  ~  January 08, 2014:  53m9moV & Bryce reports interval eval by DerPayton Mccallumr rash on scalp (looks like Seb  Dermatitis) and w/ Podiatry- he indicates improved...     HBP> on MetoprololER100, CalanSR120, Micardis80, Clonidine0.2Bid, Demadex20-3/d, Epleronone25, K20-3/d; BP=132/84 & he denies CP, palpit, dizzy, ch in SOB/DOE, edema...    CHF> followed by DrHochrein for Cards- w/ severe Diastolic CHF, normal C's by cath in 2005, & normal LV systolic function w/ EF=PP=50%een 2/15- stable, notes DOE- no change, pt refused f/u 2DEcho, rec conservative Rx...    AS> w/ severely calcif AoV leaflets & mod AS w/ 26m52man gradient (peak=39); last 2DEcho 10/12- pt desired conservative Rx, DrHochrein's notes are reviewed...    AFib> on Coumadin via CC; stable AFib on Coumadin w/ [place] wants to continue as is...    Pulm Art aneurysm & PulmHTN> O2sat=95% on RA at rest today & he  has home O2 but just uses it "prn";  3cm PA aneurysm followed by DrGearhardt without change x yrs...    CHOL> on Lip20; FLP 6/15 shows TChol 147, TG 82, HDL 55, LDL 76... Stable, continue same rx...    Renal Insuffic, BPH> on Flomax0.4; baseline Creat= 1.4-1.6 range; followed by DrTannenbaum & managed conservatively, last seen 10/13- note reviewed...    DJD, Gout> on Allopurinol100, VitD2000, Aleve/ Tylenol; stable w/o specific complaints at present... We reviewed prob list, meds, xrays and labs> see below for updates >>   CXR 6/15 showed similar prominent PA segment, mild cardiomeg & enlarged desc thor Ao, clear lungs w/ elev left hemidiaph (stable), DJD in Tspine...   LABS 6/15:  FLP- at goals on Lip20;  Chems- ok w/ BS=121 on diet, Cr=1.6 stable RI;  CBC- wnl;  TSH=2.80;  Uric=6.2 on allopurinol100;  BNP=511  ~  November 16, 2015:  104moROV & Jayshun has received his PCP f/u w/ GTerri Piedra and continues to f/u w/ DrHochrein for Cards;  He is now 82y/o> He presents today c/o mucus in his lungs- notes sl cough, small amt yellow sput, no hemoptysis and incr SOB- all x several wks he says;  Denies f/c/s, no CP, & he  doesn't know what brought this on? He has Home O2 but only uses it prn- occas at night, occas days if SOB, does not use it w/ activ;  He has NOT been exercising "just walking in the house" & he notes DOE w/ ADLs "for some time now";  He has a lot of back pain, his glaucoma is worse w/ decr vision, & he recently saw Urology- on Flomax... We reviewed the following medical problems during today's office visit >> Note: pt requested further eval for his dyspnea which is actually a rather chronic problem...    Dyspnea> this is clearly multifactorial- 82y/o, sedentary/ deconditioned, diastolicCHF, modAS, AFib, PulmHTN; he has Home O2 but only uses it "prn"    HBP> on MetoprololER100, CalanSR120, Micardis80, Clonidine0.2Bid, Demadex20-3/d, Epleronone25, K20-3/d; BP=112/82 & he denies CP, palpit, dizzy, edema but has SOB/DOE as above...    CHF> followed by DrHochrein for Cards- w/ severe Diastolic CHF, normal C's by cath in 2005, & normal LV systolic function w/ EVW=09% seen 1/17- stable, notes DOE- no change, pt refused f/u 2DEcho, wants conservative Rx...    AS> w/ severely calcif AoV leaflets & mod AS w/ 233mmean gradient (peak=39); last 2DEcho 10/12- pt desired conservative Rx, DrHochrein's notes are reviewed...    AFib> on Coumadin via CC; stable AFib on Coumadin [place]/ [place] wants [place] continue as is...    Pulm Art aneurysm & PulmHTN> O2sat=93% on RA at rest today & he has home O2 but just uses it "prn";  3cm PA aneurysm followed by DrGearhardt without change x yrs...    CHOL> on Lip20; FLP last 6/15 showed TChol 147, TG 82, HDL 55, LDL 76... Stable, continue same rx...    Renal Insuffic, BPH> on Flomax0.4; baseline Creat= 1.5-1.7 range; followed by DrTannenbaum & managed conservatively, no recent notes avail to review...    DJD, Gout> on Allopurinol100, VitD2000, Aleve/ Tylenol; stable w/o specific complaints at present... EXAM shows Afeb, VSS, O2sat=93% on RA;  HEENT- neg,  mallampati2;  Chest- clear w/o w/r/r;  Heart- irreg AFib, Gr2/6SEM w/o r/g;  Abd- soft, nontender, neg;  Ext- neg w/o c/c/e;  Neuro- no focal deficits...  CXR 11/16/15>  elev left hemidiaph, markedly  tort Ao, right PA dilatation, sl incr markings, NAD...  Spirometry 11/16/15>  FVC=1.34 (37%), FEV1=0.95 (36%), %1sec=71%, mid-flows are reduced at 23% predicted;  This is c/w severe pulm restriction and superimposed small airways dis.  Ambulatory Oximetry 11/16/15>  O2sat=100% on RA at rest w/ pulse=75/min; He ambulated 1 Lap only & stopped due to fatigue, leg & back pain w/ lowest O2sat=92% w/ pulse 120/min  LABS 11/16/15>  Chems- ok x BUN=38, Cr=1.75;  CBC- ok w/ Hg=13.6;  TSH=2.58;  BNP=610;  Sed=41  2DEcho 12/06/15>  ModLVH, norm LVF w/ EF=60-65% & norm wall motion, AoV w/ thickened calcif leaflets & modAS, severe LAdil (36m) & RAdil, mildly reduced RVF, severeTR, mod pulmHTN w/ PAsys=64... IMP/PLAN>>  CKillianis 82y/o w/ mult chronic medical issues- modAS, mod pulmHTN & reduced RVF;  I told him that the main therapeutic intervention at this point is to convince him to use his O2 at 2L/min continuously 24/7 (think of it as a medication);  Concentrate on good deep breaths & consider IS; use Mucinex600- 1to2Bid for the congestion  ~  January 13, 2016:  235moOV & ChValentinoas been wearing his O2 regularly at night & more often during the day but still not 24/7 as recommended;  His PCP is GrTerri PiedraCaElsahhe is [age]/o w/ AS, diastolicCHF, AFib, pulm art aneurysm & pulmHTN on a complex medical regimen-- Coumadin, ToprolXL100, VerapamilER120, Micardis80, Catapress0.2Bid, Demadex20-3/d, Eplerenone25, K20-3/d and I wonder if he's taking all this regularly;  He is here w/ his daughter today & her main issue is to request HOME HEALTH help as she says he qualifies for 35H/wk- OK...     We reviewed above studies-- CHF w/ BNP=610 but BUB=38 & Cr=1.75 therefore not much wiggle room w/ his diuretics => we will  refer to CHF clinic for their input... EXAM shows Afeb, VSS, O2sat=92% on RA;  HEENT- neg, mallampati2;  Chest- clear w/o w/r/r;  Heart- irreg AFib, Gr2/6SEM w/o r/g;  Abd- soft, nontender, neg;  Ext- neg w/o c/c/e;  Neuro- no focal deficits... IMP/PLAN>>  We discussed referral to the CHF clinic, DrLewisfor his input regarding meds & optimization...              Problem List:  OPHTHALMOLOGY - s/p cataract surg & followed by DrBond for worsening Glaucoma & poor vision on gtts daily...  PULMONARY HYPERTENSION (ICD-416.8) - on home oxygen 2L/min "Prn"... ~  Prev 2DEcho w/ est PAsys= 64... he has not had a right heart cath... ~  Repeat 2DEcho 10/12 showed mod LVH, norm sys function w/ EF=55-60% & norm wall motion, Gr2DD, severely calcif AoV leaflets w/ mod AS & 2513mean gradient (peak=39), mild MR, severe LAdil & RAdil, RV sys function normal, modTR, PAsys=43... ~  6/13:  O2sat today is 95% on RA at rest;  CXR 6/13 showed mod cardiomeg unchanged, ectasia & calcif in Ao, enlarged right pulm art (no change), elev left hemidiaph w/ bibasilar atx, NAD... ~  6/14:  O2sat today is 94% on RA at rest;  CXR 6/14 showed borderline cardiomeg, extremely tort Ao, PA dilatation is the same (no change), elev left hemidiaph, scarring at bases, NAD... ~  6/15:  O2sat today is 95% on RA at rest;  CXR 6/15 showed similar prominent PA segment, mild cardiomeg & enlarged desc thor Ao, clear lungs w/ elev left hemidiaph (stable), DJD in Tspine.  HYPERTENSION (ICD-401.9) -  ~  on METOPROLOL ER 100m48m/2 tab daily,  VERAPAMIL 240mg9m  MICARDIS 9m/d,  CLONIDINE 0.2102m 1/2 Bid,  DEMADEX 2067m2am & 1pm,  EPLERENONE 27m38m & K20- 3/d; he was prev on Minoxidil  but this discontinued by Cardiology... ~  6/10: DrHochrein added back the Clonidine & asked him, again, to monitor BP at home. ~  12/10:  they requested trial to decr K20's from 6/d required due to his Demedex... suggest adding ALDACTONE 27mg63m decr K20 to  4/d w/ f/u labs... ~  6/11:  Aldactone caused gynecomastia & was switched to EPLERENONE 27mg/63m [name] w/ some improvment in side effect. ~  6/12:  BP controlled & labs sl improved w/ the Demadex decr to 3/d... ~  12/12:  BP= 126/72 today and not checking at home, tolerating meds well; denies HA, visual changes, CP, palipit, dizziness, syncope; he has chr DOE without change and mild edema... ~  6/13:  BP= 120/72 & he remains stable w/o CP, palpit, +SOB w/o change, tr edema... ~  12/13: on MetoprololER100-1/2 daily, CalanSR120, Micardis80, Clonidine0.2-1/2Bid, Demadex20-3/d, Epleronone25, K20-3/d; BP=144/92 & he has sl HA; we decided to incr his Metopto 100mg/d34mhe Clonidine0.2 to 1 tab Bid. ~  6/14: on MetoprololER100, CalanSR120, Micardis80, Clonidine0.2Bid, Demadex20-3/d, Epleronone25, K20-3/d; BP=130/80 & he is asked to monitor BP at home more closely; he denies CP, palpit, dizzy, ch in SOB/DOE, edema. ~  12/14: on MetoprololER100, CalanSR120, Micardis80, Clonidine0.2Bid, Demadex20-3/d, Epleronone25, K20-3/d; BP=132/78 & he denies CP, palpit, dizzy, ch in SOB/DOE, edema. ~  6/15: on MetoprololER100, CalanSR120, Micardis80, Clonidine0.2Bid, Demadex20-3/d, Epleronone25, K20-3/d; BP=132/84 & he remains asymptomatic but sedentary...  CHF (ICD-428.0) - see meds above... followed in the CHF Clinic by DrHochrein;  he has severe Diastolic CHF, w/ normal C's by cath in 2005, & normal LV systolic function w/ EF=75%.XV=40%es DOE- no change. ~  labs 6/09 showed BNP= 761 ~  labs 9/09 showed BNP= 486 ~  labs 12/09 showed BNP = 568 ~  labs 6/10 showed BNP= 421 ~  labs 12/10 showed BNP= 577... adding SPIRONOLACTONE 27mg/d.68mlabs 1/11 showed BNP= 395... later ch to EPLERENONE 27mg/d. 74mabs 12/11 showed BNP= 535 ~  Labs 6/12 showed BNP= 524 (on Demedex3/d + Eplerenone27mg/d). 58mabs 12/12 showed BNP= 371 ~  4/13:  He had f/u DrHochrein> Mod AS, chr AFib, DD, etc; stable, no progressive symptoms &  pt preferred conserv Rx; they discussed poss TAVR in future... ~  Labs 6/13 showed BNP= 381 (note: BUN=30 Creat=1.5) ~  1/14:  He had f/u DrHochrein> mult cardiovasc problems, doing well, no CP etc; rec salt & fluid restriction, pt declined f/u 2DEcho, on Coumadin & rate control, continue conservative management... ~  2/15:  He had Cards f/u w/ DrHochrein> stable, notes DOE- no change, pt refused f/u 2DEcho, rec conservative Rx. ~  Stable on diuretic therapy, no changes made...  AORTIC STENOSIS (ICD-424.1) - moderate by 2DEcho & followed by DrHochrein... ~  repeat 2DEcho 1/09 hosp = similiar mod AS... ~  Repeat 2DEcho 1/12 showed mild LVH, norm sys function w/ EF=55-60% & no regional wall motion abn, mod AS, dilated LA&RA, modTR, PAsys=50... ~  Repeat 2DEcho 10/12 showed mod LVH, norm sys function w/ EF=55-60% & norm wall motion, Gr2DD, severely calcif AoV leaflets w/ mod AS & 27mm mean 67mient (peak=39), mild MR, severe LAdil & RAdil, RV sys function normal, modTR, PAsys=43... ~  Pt has refused f/u 2DEcho...  ATRIAL FIBRILLATION (ICD-427.31) - on COUMADIN & followed in the Coumadin Clinic. ~  EKG 4/13 showed  AFib, rate82, LAD, NSSTTWA...  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 ~  CXR 1/09 showed unchanged right PA aneurysm, stable cardiomeg, elevated left hemidiaph w/ atx. ~  CXR 6/10 showed no change, Ao tortuous and calcif, NAD. ~  CXR 12/11 showed stable elv left hemidiaph, bibasilar atx/ scarring, cardiomeg, ectatic ao, right PA enlargement. ~  CXR 6/13 showed mod cardiomeg unchanged, ectasia & calcif in Ao, enlarged right pulm art (no change), elev left hemidiaph w/ bibasilar atx, NAD.Marland Kitchen. ~  CXR 6/14 showed borderline cardiomeg, tortAo, PA dilatation is the same (no change), elev left hemidiaph, scarring at bases, NAD. ~  CXR 6/15 showed similar prominent PA segment, mild cardiomeg & enlarged desc thor Ao, clear lungs w/  elev left hemidiaph (stable), DJD in Tspine.  ASPVD >> on Coumadin for his AFib... ~  DrHochrein ordered CDopplers 12/13> mild heterogeneous plaque on right & mixed irreg plaque on left, 0-39% bilat ICA stenoses & antegrade vertebral flow; f/u 92yr..  HYPERCHOLESTEROLEMIA (ICD-272.0) - now on LIPITOR 278md (prev Simva40 changed due to concomitant Verap). ~  [place] on Simva40 showed TChol 95, TG 39, HDL 36, LDL 51 ~  FLP 12/09 on Simva40 showed TChol 167, TG 68, HDL 64, LDL 90 ~  FLP 1/11 on Simva40 showed TChol 153, TG 101, HDL 50, LDL 83... later ch to [place]~  [place] on Lip20 showed TChol 162, TG 95, HDL 46, LDL 97 ~  6/13: not fasting for FLP today... ~  6/14: FLP on Lip20 showed TChol 150, TG 89, HDL 51, LDL 81... ~  FLP 6/15 on Lip20 showed TChol 147, TG 82, HDL 55, LDL 76...  HEMORRHOIDS (ICD-455.6)  RENAL INSUFFICIENCY (ICD-588.9)  ~  Creat=1.4 - 1.6 during 1/09 hosp... ~  labs 6/09 & 9/09 showed Creat= 1.3 ~  labs 12/09 showed BUN= 20, Creat= 1.3, K= 4.0 ~  labs 6/10 showed BUN= 24, Creat= 1.3, K= 3.8 ~  labs 12/10 showed BUN= 12, Creat= 1.2, K= 3.6 (on 6 K20/d) ~  labs 4/11 showed BUN= 18, Creat= 1.4, K= 4.0 (on 4 KCl/d) ~  labs 12/11 showed BUN= 32, Creat= 1.8, K= 4.3 ~  Labs 6/12 showed BUN= 27, creat= 1.6, K= 4.1 ~  Labs 12/12 showed BUN=21, Creat=1.6, K=3.9 ~  Labs 6/13 showed BUN= 30, Creat= 1.5, K= 4.5 ~  Labs 6/14 showed BUN= 26, Cr= 1.5, K= 3.5 (encouraged to take the KCl 2082m 3/d)... ~  Labs 6/15 showed BUN= 25, Cr= 1.6, K= 3.9  BENIGN PROSTATIC HYPERTROPHY, HX OF (ICD-V13.8) - followed by DrTannenbaum & taking FLOMAX 0.4mg17m.. last seen 5/09 and his note is reviewed... ~  labs 12/09 showed PSA= 1.42 ~  labs 2/11 showed PSA= 1.65 ~  Labs 6/13 showed PSA= 1.58 ~  10/13: he had Urology f/u DrTannenbaum> BPH w/ BOO, nocturia, mild Uincont; he didn't want any intervention & no changes made to his regimen...  CVA (ICD-434.91) - Stroke in 1990 in the setting  of HBP...  DEGENERATIVE JOINT DISEASE, GENERALIZED (ICD-715.00) - he uses OTC pain meds & TRAMADOL 50mg66m...  GOUT (ICD-274.9) - on ALLOPURINOL 100mg/41m& now off the Colchicine... ~  labs 10/08 showed Uric= 10.3 ~  labs 12/09 showed Uric= 7.7... he Marland Kitchenants to continue same meds. ~  labs 12/11 showed Uric = 6.4 ~  Labs 6/15 showed Uric= 6.2  INGUINAL HERNIA (ICD-550.90)  Health Maintenance - he receives the yearly seasonal  Flu vaccines... f/u PNEUMOVAX at his request 2010...   Past Surgical History  Procedure Laterality Date  . Appendectomy    . Inguinal hernia repair    . Cataract extraction    . Cosmetic eye surgery      Outpatient Encounter Prescriptions as of 01/13/2016  Medication Sig  . allopurinol (ZYLOPRIM) 100 MG tablet TAKE 1 TABLET BY MOUTH DAILY  . atorvastatin (LIPITOR) 20 MG tablet TAKE 1 TABLET BY MOUTH DAILY  . bimatoprost (LUMIGAN) 0.03 % ophthalmic solution Place 1 drop into both eyes at bedtime.    . brinzolamide (AZOPT) 1 % ophthalmic suspension Place 1 drop into both eyes 2 (two) times daily.    . Cholecalciferol (VITAMIN D) 2000 UNITS CAPS Take 1 capsule by mouth daily.    . cloNIDine (CATAPRES) 0.2 MG tablet TAKE 1 TABLET(0.2 MG) BY MOUTH TWICE DAILY  . eplerenone (INSPRA) 25 MG tablet TAKE 1 TABLET BY MOUTH EVERY DAY  . meclizine (ANTIVERT) 25 MG tablet Take 1 tablet (25 mg total) by mouth 3 (three) times daily as needed.  . metoprolol succinate (TOPROL-XL) 100 MG 24 hr tablet Take 1 tablet (100 mg total) by mouth daily. Take with or immediately following a meal.  . pilocarpine (PILOCAR) 4 % ophthalmic solution 1 drop into left eye every 4 hrs as needed  . potassium chloride SA (K-DUR,KLOR-CON) 20 MEQ tablet TAKE 3 TABLETS BY MOUTH DAILY  . Tamsulosin HCl (FLOMAX) 0.4 MG CAPS Take 0.4 mg by mouth daily.    Marland Kitchen telmisartan (MICARDIS) 80 MG tablet TAKE 1 TABLET BY MOUTH DAILY  . torsemide (DEMADEX) 20 MG tablet TAKE 2 TABLETS BY MOUTH EVERY MORNING AND TAKE 1  TABLET BY MOUTH EVERY EVENING  . verapamil (CALAN-SR) 120 MG CR tablet TAKE 1 TABLET BY MOUTH EVERY NIGHT AT BEDTIME  . warfarin (COUMADIN) 5 MG tablet Take 1 tablet by mouth daily or as directed by coumadin clinic  . [DISCONTINUED] metoprolol succinate (TOPROL-XL) 100 MG 24 hr tablet TAKE 1 TABLET BY MOUTH DAILY. TAKE WITH OR IMMEDIATELY FOLLOWING A MEAL.  . [DISCONTINUED] torsemide (DEMADEX) 20 MG tablet Take 2 tablets by mouth every morning and 1 tablet by mouth in the evening Overdue for yearly physical w/labs must see Marya Amsler for refills  . [DISCONTINUED] torsemide (DEMADEX) 20 MG tablet Take 2 tablets by mouth every morning and 1 tablet in the evening Overdue for follow-up appt must see Marya Amsler for refills  . [DISCONTINUED] torsemide (DEMADEX) 20 MG tablet TAKE 2 TABLET BY MOUTH EVERY MORNING AND 1 TABLET IN THE EVENING   No facility-administered encounter medications on file as of 01/13/2016.    Allergies  Allergen Reactions  . Ibuprofen     REACTION: causes low blood pressure    Current Medications, Allergies, Past Medical History, Past Surgical History, Family History, and Social History were reviewed in [employer] record.    Review of Systems        See HPI - all other systems neg except as noted... The patient complains of decreased hearing, dyspnea on exertion, peripheral edema, and difficulty walking.  The patient denies anorexia, fever, weight loss, weight gain, vision loss, hoarseness, chest pain, syncope, prolonged cough, headaches, hemoptysis, abdominal pain, melena, hematochezia, severe indigestion/heartburn, hematuria, incontinence, muscle weakness, suspicious skin lesions, transient blindness, depression, unusual weight change, abnormal bleeding, enlarged lymph nodes, and angioedema.   Objective:   Physical Exam     WD, WN, 82 y/o BM in NAD... GENERAL:  Alert & oriented;  pleasant & cooperative... HEENT:  [place]/AT, EOM-wnl, PERRLA, EACs-clear, TMs-wnl,  NOSE-clear, THROAT-clear & wnl. NECK:  Supple w/ fairROM; no JVD; sl decr carotid impulses w/ transmit murmur; no thyromegaly or nodules palpated; no lymphadenopathy. CHEST:  Clear to P & A; without wheezes/ rales/ or rhonchi heard... HEART:  sl irreg, gr 2/6 AS murmur at base, without rubs or gallops apprec... ABDOMEN:  Soft & nontender; normal bowel sounds; no organomegaly or masses detected. EXT: without deformities, mild arthritic changes; no varicose veins/ +venous insuffic/ tr edema. NEURO:  CN's intact; no focal neuro deficits... DERM:  No lesions noted; no rash etc...  RADIOLOGY DATA:  Reviewed in the EPIC EMR & discussed w/ the patient...  LABORATORY DATA:  Reviewed in the EPIC EMR & discussed w/ the patient...   Assessment & Plan:    HBP>  Adeq control on his extensive med regimen; we reviewed meds w/ family- they need to supervise & be sure he takes everything regularly...  CHF>  Stable on current meds; renal w/ Creat=1.75 stable as well...  AS>  Mod AS on 2DEcho & followed by DrHochrein.  AFib>  He remains on Coumadin followed in the [facility]...  PA art aneurysm & Pulm HTN>  Stable on current med regimen & BP control...  CHOL>  FLP looks good on Lip20...  Renal Insuffic>  Creat improved to 1.5-1.8 & stable on current meds...  DJD/ Gout>  Stable as well, c/o some rib pain as noted; we discussed rest/ heat/ rib binder & Tramadol...  Other medical problems as noted...   Patient's Medications  New Prescriptions   No medications on file  Previous Medications   ALLOPURINOL (ZYLOPRIM) 100 MG TABLET    TAKE 1 TABLET BY MOUTH DAILY   ATORVASTATIN (LIPITOR) 20 MG TABLET    TAKE 1 TABLET BY MOUTH DAILY   BIMATOPROST (LUMIGAN) 0.03 % OPHTHALMIC SOLUTION    Place 1 drop into both eyes at bedtime.     BRINZOLAMIDE (AZOPT) 1 % OPHTHALMIC SUSPENSION    Place 1 drop into both eyes 2 (two) times daily.     CHOLECALCIFEROL (VITAMIN D) 2000 UNITS CAPS    Take 1 capsule by mouth daily.      CLONIDINE (CATAPRES) 0.2 MG TABLET    TAKE 1 TABLET(0.2 MG) BY MOUTH TWICE DAILY   EPLERENONE (INSPRA) 25 MG TABLET    TAKE 1 TABLET BY MOUTH EVERY DAY   MECLIZINE (ANTIVERT) 25 MG TABLET    Take 1 tablet (25 mg total) by mouth 3 (three) times daily as needed.   METOPROLOL SUCCINATE (TOPROL-XL) 100 MG 24 HR TABLET    Take 1 tablet (100 mg total) by mouth daily. Take with or immediately following a meal.   PILOCARPINE (PILOCAR) 4 % OPHTHALMIC SOLUTION    1 drop into left eye every 4 hrs as needed   POTASSIUM CHLORIDE SA (K-DUR,KLOR-CON) 20 MEQ TABLET    TAKE 3 TABLETS BY MOUTH DAILY   TAMSULOSIN HCL (FLOMAX) 0.4 MG CAPS    Take 0.4 mg by mouth daily.     TELMISARTAN (MICARDIS) 80 MG TABLET    TAKE 1 TABLET BY MOUTH DAILY   TORSEMIDE (DEMADEX) 20 MG TABLET    TAKE 2 TABLETS BY MOUTH EVERY MORNING AND TAKE 1 TABLET BY MOUTH EVERY EVENING   VERAPAMIL (CALAN-SR) 120 MG CR TABLET    TAKE 1 TABLET BY MOUTH EVERY NIGHT AT BEDTIME   WARFARIN (COUMADIN) 5 MG TABLET    Take 1 tablet by mouth  daily or as directed by coumadin clinic  Modified Medications   No medications on file  Discontinued Medications   METOPROLOL SUCCINATE (TOPROL-XL) 100 MG 24 HR TABLET    TAKE 1 TABLET BY MOUTH DAILY. TAKE WITH OR IMMEDIATELY FOLLOWING A MEAL.   TORSEMIDE (DEMADEX) 20 MG TABLET    Take 2 tablets by mouth every morning and 1 tablet by mouth in the evening Overdue for yearly physical w/labs must see Marya Amsler for refills   TORSEMIDE (DEMADEX) 20 MG TABLET    Take 2 tablets by mouth every morning and 1 tablet in the evening Overdue for follow-up appt must see Marya Amsler for refills   TORSEMIDE (DEMADEX) 20 MG TABLET    TAKE 2 TABLET BY MOUTH EVERY MORNING AND 1 TABLET IN THE EVENING

## 2016-01-13 NOTE — Telephone Encounter (Signed)
[name] [name] from [place] office called wanting to get this pt seen by [facility].. Is this pt ok to schedule

## 2016-01-13 NOTE — Progress Notes (Signed)
Subjective:    Patient ID: [name] [name], male    DOB: 1926-04-22, 82 y.o.   MRN: [id number]  HPI 82 y/o BM here for a follow up visit... he has multiple medical problems including HBP, CAD, AS, AFib, & CHF followed by DrHochrein & the CHFclinic;  known PulmHTN & PA aneurysm conservatively managed by DrGearhardt;  Hyperchol;  RI & BPH;  DJD & Gout... ~  SEE PREV EPIC NOTES FOR OLDER DATA >>   ~  July 16, 2012:  73moROV & Sakai reports worsening vision due to his Glaucoma- on eye drops & followed closely by Ophthalmology;  BP was recently elev at DrTannenbaum's office & we decided to increase his doses of Clonidine & Metoprolol meds...  We reviewed the following medical problems during today's office visit>>     HBP> on MetoprololER100-1/2 daily, CalanSR120, Micardis80, Clonidine0.2-1/2Bid, Demadex20-3/d, Epleronone25, K20-3/d; BP=144/92 & he has sl HA; we decided to incr his Metopto 1061md & the Clonidine0.2 to 1 tab Bid...    CHF> followed by DrHochrein for Cards- w/ severe Diastolic CHF, normal C's by cath in 2005, & normal LV systolic function w/ EFBD=53%notes DOE- no change.    AS> w/ severely calcif AoV leaflets & mod AS w/ 2525mean gradient (peak=39); pt desired conservative Rx, DrHochrein's notes are reviewed...    AFib> on Coumadin via CC; stable AFib on Coumadin w/ rate control...    Pulm Art aneurysm & PulmHTN> O2sat=94% on RA at rest & he has home O2 but just uses it "prn";  3 cm PA aneurysm followed by DrGearhardt without change x yrs...    CHOL> on Lip20; last FLP 6/12 showed TChol 162, TG 95, HDL 46, LDL 97; he doesn't seem to remember to come fasting for f/u labs...    Renal Insuffic, BPH> on Flomax0.4; baseline Creat= 1.5-1.6 range; followed by DrTannenbaum & managed conservatively...    DJD, Gout> on Tramadol, Allopurinol100, VitD2000; stable w/o specific complaints at present... We reviewed prob list, meds, xrays and labs> see below for updates >> he had the 2013 Flu  vaccine in Oct...   NOTE> pt did not go to the lab for his BMet & BNP as requested...  ADDENDUM: DrHochrein ordered CDopplers 12/13> mild heterogeneous plaque on right & mixed irreg plaque on left, 0-39% bilat ICA stenoses & antegrade vertebral flow; f/u 44yr13yr  ~  January 14, 2013:  63mo 79mo& [name] [name] is stable- no new complaints or concerns; he has a sm lipoma on the right post neck area (pointed out by the HouseLevi Strausse for [employer]);[facility] reviewed the following medical problems during today's office visit >>     HBP> on MetoprololER100, CalanSR120, Micardis80, Clonidine0.2Bid, Demadex20-3/d, Epleronone25, K20-3/d; BP=130/80 & he is asked to monitor BP at home more closely; he denies CP, palpit, dizzy, ch in SOB/DOE, edema...    CHF> followed by DrHochrein for Cards- w/ severe Diastolic CHF, normal C's by cath in 2005, & normal LV systolic function w/ EF=75GD=92%n 1/14- stable, notes DOE- no change, pt refused f/u 2DEcho, rec conservative Rx...    AS> w/ severely calcif AoV leaflets & mod AS w/ 25mm 83m gradient (peak=39); last 2DEcho 10/12- pt desired conservative Rx, DrHochrein's notes are reviewed...    AFib> on Coumadin via CC; stable AFib on Coumadin w/ rate control...    Pulm Art aneurysm & PulmHTN> O2sat=94% on RA at rest & he has home O2 but just uses it "prn";  3cm PA aneurysm followed  by DrGearhardt without change x yrs...    CHOL> on Lip20; last FLP 6/12 showed TChol 162, TG 95, HDL 46, LDL 97; asked to ret for f/u FASTING labs    Renal Insuffic, BPH> on Flomax0.4; baseline Creat= 1.4-1.6 range; followed by DrTannenbaum & managed conservatively, last seen 10/13- note reviewed...    DJD, Gout> on Tramadol, Allopurinol100, VitD2000; stable w/o specific complaints at present... We reviewed prob list, meds, xrays and labs> see below for updates >>   CXR 6/14 showed borderline cardiomeg, extremely tort Ao, PA dilatation is the same (no change), elev left hemidiaph, scarring at bases,  NAD...  LABS 6/14:  Pending...  ~  July 15, 2013:  45moROV & [name] [name] is stable, here w/ his daighter who confirms; as noted he is blind in left eye w/ poor vision on right as well (can't read or drive, can see some TV from a distance), hx Glaucoma Rx by DrBond; he has not been exercising much- has a lCivil Service fast streamernamed [name] [name] & I suggested he get out & walk the dog w/ family help...  He has some minor somatic complaints- notes "ridges, wrinkles" on his scalp that seem to come & go, notes mild arthritis pain in back, VI/ edema in legs; we offered reassurance, discussed Aleve, Tylenol, no salt, elevation, support hose & continue current meds...    HBP> on MetoprololER100, CalanSR120, Micardis80, Clonidine0.2Bid, Demadex20-3/d, Epleronone25, K20-3/d; BP=132/78 & he denies CP, palpit, dizzy, ch in SOB/DOE, edema...    CHF> followed by DrHochrein for Cards- w/ severe Diastolic CHF, normal C's by cath in 2005, & normal LV systolic function w/ EQQ=76% seen 1/14- stable, notes DOE- no change, pt refused f/u 2DEcho, rec conservative Rx...    AS> w/ severely calcif AoV leaflets & mod AS w/ 2653mmean gradient (peak=39); last 2DEcho 10/12- pt desired conservative Rx, DrHochrein's notes are reviewed...    AFib> on Coumadin via CC; stable AFib on Coumadin w/ rate control from [place].    Pulm Art aneurysm & PulmHTN> O2sat=98% on RA at rest today & he has home O2 but just uses it "prn";  3cm PA aneurysm followed by DrGearhardt without change x yrs...    CHOL> on Lip20; last FLP 6/14 showed TChol 150, TG 89, HDL 51, LDL 81... Continue same rx...    Renal Insuffic, BPH> on Flomax0.4; baseline Creat= 1.4-1.6 range; followed by DrTannenbaum & managed conservatively, last seen 10/13- note reviewed...    DJD, Gout> on Allopurinol100, VitD2000, Aleve/ Tylenol; stable w/o specific complaints at present...  ~  January 08, 2014:  53m9moV & [name] [name] reports interval eval by DerPayton Mccallumr rash on scalp (looks like Seb  Dermatitis) and w/ Podiatry- he indicates improved...     HBP> on MetoprololER100, CalanSR120, Micardis80, Clonidine0.2Bid, Demadex20-3/d, Epleronone25, K20-3/d; BP=132/84 & he denies CP, palpit, dizzy, ch in SOB/DOE, edema...    CHF> followed by DrHochrein for Cards- w/ severe Diastolic CHF, normal C's by cath in 2005, & normal LV systolic function w/ EF=PP=50%een 2/15- stable, notes DOE- no change, pt refused f/u 2DEcho, rec conservative Rx...    AS> w/ severely calcif AoV leaflets & mod AS w/ 26m52man gradient (peak=39); last 2DEcho 10/12- pt desired conservative Rx, DrHochrein's notes are reviewed...    AFib> on Coumadin via CC; stable AFib on Coumadin w/ rate [place] from [place] wants to continue as is...    Pulm Art aneurysm & PulmHTN> O2sat=95% on RA at rest today & he  has home O2 but just uses it "prn";  3cm PA aneurysm followed by DrGearhardt without change x yrs...    CHOL> on Lip20; FLP 6/15 shows TChol 147, TG 82, HDL 55, LDL 76... Stable, continue same rx...    Renal Insuffic, BPH> on Flomax0.4; baseline Creat= 1.4-1.6 range; followed by DrTannenbaum & managed conservatively, last seen 10/13- note reviewed...    DJD, Gout> on Allopurinol100, VitD2000, Aleve/ Tylenol; stable w/o specific complaints at present... We reviewed prob list, meds, xrays and labs> see below for updates >>   CXR 6/15 showed similar prominent PA segment, mild cardiomeg & enlarged desc thor Ao, clear lungs w/ elev left hemidiaph (stable), DJD in Tspine...   LABS 6/15:  FLP- at goals on Lip20;  Chems- ok w/ BS=121 on diet, Cr=1.6 stable RI;  CBC- wnl;  TSH=2.80;  Uric=6.2 on allopurinol100;  BNP=511  ~  November 16, 2015:  29moROV & [name] [name] has received his PCP f/u w/ GTerri Piedra and continues to f/u w/ DrHochrein for Cards;  He is now 82y/o> He presents today c/o mucus in his lungs- notes sl cough, small amt yellow sput, no hemoptysis and incr SOB- all x several wks he says;  Denies f/c/s, no CP, & he  doesn't know what brought this on? He has Home O2 but only uses it prn- occas at night, occas days if SOB, does not use it w/ activ;  He has NOT been exercising "just walking in the house" & he notes DOE w/ ADLs "for some time now";  He has a lot of back pain, his glaucoma is worse w/ decr vision, & he recently saw Urology- on Flomax... We reviewed the following medical problems during today's office visit >> Note: pt requested further eval for his dyspnea which is actually a rather chronic problem...    Dyspnea> this is clearly multifactorial- 82y/o, sedentary/ deconditioned, diastolicCHF, modAS, AFib, PulmHTN; he has Home O2 but only uses it "prn"    HBP> on MetoprololER100, CalanSR120, Micardis80, Clonidine0.2Bid, Demadex20-3/d, Epleronone25, K20-3/d; BP=112/82 & he denies CP, palpit, dizzy, edema but has SOB/DOE as above...    CHF> followed by DrHochrein for Cards- w/ severe Diastolic CHF, normal C's by cath in 2005, & normal LV systolic function w/ EAJ=28% seen 1/17- stable, notes DOE- no change, pt refused f/u 2DEcho, wants conservative Rx...    AS> w/ severely calcif AoV leaflets & mod AS w/ 254mmean gradient (peak=39); last 2DEcho 10/12- pt desired conservative Rx, DrHochrein's notes are reviewed...    AFib> on Coumadin via CC; stable AFib on Coumadin w/ rate control from [place] wants to continue as is...    Pulm Art aneurysm & PulmHTN> O2sat=93% on RA at rest today & he has home O2 but just uses it "prn";  3cm PA aneurysm followed by DrGearhardt without change x yrs...    CHOL> on Lip20; FLP last 6/15 showed TChol 147, TG 82, HDL 55, LDL 76... Stable, continue same rx...    Renal Insuffic, BPH> on Flomax0.4; baseline Creat= 1.5-1.7 range; followed by DrTannenbaum & managed conservatively, no recent notes avail to review...    DJD, Gout> on Allopurinol100, VitD2000, Aleve/ Tylenol; stable w/o specific complaints at present... EXAM shows Afeb, VSS, O2sat=93% on RA;  HEENT- neg,  mallampati2;  Chest- clear w/o w/r/r;  Heart- irreg AFib, Gr2/6SEM w/o r/g;  Abd- soft, nontender, neg;  Ext- neg w/o c/c/e;  Neuro- no focal deficits...  CXR 11/16/15>  elev left hemidiaph, markedly  tort Ao, right PA dilatation, sl incr markings, NAD...  Spirometry 11/16/15>  FVC=1.34 (37%), FEV1=0.95 (36%), %1sec=71%, mid-flows are reduced at 23% predicted;  This is c/w severe pulm restriction and superimposed small airways dis.  Ambulatory Oximetry 11/16/15>  O2sat=100% on RA at rest w/ pulse=75/min; He ambulated 1 Lap only & stopped due to fatigue, leg & back pain w/ lowest O2sat=92% w/ pulse 120/min  LABS 11/16/15>  Chems- ok x BUN=38, Cr=1.75;  CBC- ok w/ Hg=13.6;  TSH=2.58;  BNP=610;  Sed=41  2DEcho 12/06/15>  ModLVH, norm LVF w/ EF=60-65% & norm wall motion, AoV w/ thickened calcif leaflets & modAS, severe LAdil (35m) & RAdil, mildly reduced RVF, severeTR, mod pulmHTN w/ PAsys=64... IMP/PLAN>>  CGu[place]upeis 82y/o w/ mult chronic medical issues- modAS, mod pulmHTN & reduced RVF;  I told him that the main therapeutic intervention at this point is to convince him to use his O2 at 2L/min continuously 24/7 (think of it as a medication);  Concentrate on good deep breaths & consider IS; use Mucinex600- 1to2Bid for the congestion           Problem List:  OPHTHALMOLOGY - s/p cataract surg & followed by DrBond for worsening Glaucoma & poor vision on gtts daily...  PULMONARY HYPERTENSION (ICD-416.8) - on home oxygen 2L/min "Prn"... ~  Prev 2DEcho w/ est PAsys= 64... he has not had a right heart cath... ~  Repeat 2DEcho 10/12 showed mod LVH, norm sys function w/ EF=55-60% & norm wall motion, Gr2DD, severely calcif AoV leaflets w/ mod AS & 25mmean gradient (peak=39), mild MR, severe LAdil & RAdil, RV sys function normal, modTR, PAsys=43... ~  6/13:  O2sat today is 95% on RA at rest;  CXR 6/13 showed mod cardiomeg unchanged, ectasia & calcif in Ao, enlarged right pulm art (no change), elev left hemidiaph  w/ bibasilar atx, NAD... ~  6/14:  O2sat today is 94% on RA at rest;  CXR 6/14 showed borderline cardiomeg, extremely tort Ao, PA dilatation is the same (no change), elev left hemidiaph, scarring at bases, NAD... ~  6/15:  O2sat today is 95% on RA at rest;  CXR 6/15 showed similar prominent PA segment, mild cardiomeg & enlarged desc thor Ao, clear lungs w/ elev left hemidiaph (stable), DJD in Tspine.  HYPERTENSION (ICD-401.9) -  ~  on METOPROLOL ER '100mg'$ - 1/2 tab daily,  VERAPAMIL '240mg'$ /d,  MICARDIS '80mg'$ /d,  CLONIDINE 0.'2mg'$ - 1/2 Bid,  DEMADEX '20mg'$ - 2am & 1pm,  EPLERENONE '25mg'$ /d, & K20- 3/d; he was prev on Minoxidil  but this discontinued by Cardiology... ~  6/10: DrHochrein added back the Clonidine & asked him, again, to monitor BP at home. ~  12/10:  they requested trial to decr K20's from 6/d required due to his Demedex... suggest adding ALDACTONE '25mg'$ /d & decr K20 to 4/d w/ f/u labs... ~  6/11:  Aldactone caused gynecomastia & was switched to EPLERENONE '25mg'$ /d by [name] w/ some improvment in side effect. ~  6/12:  BP controlled & labs sl improved w/ the Demadex decr to 3/d... ~  12/12:  BP= 126/72 today and not checking at home, tolerating meds well; denies HA, visual changes, CP, palipit, dizziness, syncope; he has chr DOE without change and mild edema... ~  6/13:  BP= 120/72 & he remains stable w/o CP, palpit, +SOB w/o change, tr edema... ~  12/13: on MetoprololER100-1/2 daily, CalanSR120, Micardis80, Clonidine0.2-1/2Bid, Demadex20-3/d, Epleronone25, K20-3/d; BP=144/92 & he has sl HA; we decided to incr his Metopto '100mg'$ /d & the  Clonidine0.2 to 1 tab Bid. ~  6/14: on MetoprololER100, CalanSR120, Micardis80, Clonidine0.2Bid, Demadex20-3/d, Epleronone25, K20-3/d; BP=130/80 & he is asked to monitor BP at home more closely; he denies CP, palpit, dizzy, ch in SOB/DOE, edema. ~  12/14: on MetoprololER100, CalanSR120, Micardis80, Clonidine0.2Bid, Demadex20-3/d, Epleronone25, K20-3/d; BP=132/78 & he  denies CP, palpit, dizzy, ch in SOB/DOE, edema. ~  6/15: on MetoprololER100, CalanSR120, Micardis80, Clonidine0.2Bid, Demadex20-3/d, Epleronone25, K20-3/d; BP=132/84 & he remains asymptomatic but sedentary...  CHF (ICD-428.0) - see meds above... followed in the [facility]F Clinic by DrHochrein;  he has severe Diastolic CHF, w/ normal C's by cath in 2005, & normal LV systolic function w/ BJ=47%... notes DOE- no change. ~  labs 6/09 showed BNP= 761 ~  labs 9/09 showed BNP= 486 ~  labs 12/09 showed BNP = 568 ~  labs 6/10 showed BNP= 421 ~  labs 12/10 showed BNP= 577... adding SPIRONOLACTONE '25mg'$ /d. ~  labs 1/11 showed BNP= 395... later ch to EPLERENONE '25mg'$ /d. ~  labs 12/11 showed BNP= 535 ~  Labs 6/12 showed BNP= 524 (on Demedex3/d + Eplerenone'25mg'$ /d). ~  Labs 12/12 showed BNP= 371 ~  4/13:  He had f/u DrHochrein> Mod AS, chr AFib, DD, etc; stable, no progressive symptoms & pt preferred conserv Rx; they discussed poss TAVR in future... ~  Labs 6/13 showed BNP= 381 (note: BUN=30 Creat=1.5) ~  1/14:  He had f/u DrHochrein> mult cardiovasc problems, doing well, no CP etc; rec salt & fluid restriction, pt declined f/u 2DEcho, on Coumadin & rate control, continue conservative management... ~  2/15:  He had Cards f/u w/ DrHochrein> stable, notes DOE- no change, pt refused f/u 2DEcho, rec conservative Rx. ~  Stable on diuretic therapy, no changes made...  AORTIC STENOSIS (ICD-424.1) - moderate by 2DEcho & followed by DrHochrein... ~  repeat 2DEcho 1/09 hosp = similiar mod AS... ~  Repeat 2DEcho 1/12 showed mild LVH, norm sys function w/ EF=55-60% & no regional wall motion abn, mod AS, dilated LA&RA, modTR, PAsys=50... ~  Repeat 2DEcho 10/12 showed mod LVH, norm sys function w/ EF=55-60% & norm wall motion, Gr2DD, severely calcif AoV leaflets w/ mod AS & 81m mean gradient (peak=39), mild MR, severe LAdil & RAdil, RV sys function normal, modTR, PAsys=43... ~  Pt has refused f/u 2DEcho...  ATRIAL FIBRILLATION  (ICD-427.31) - on COUMADIN & followed in the Coumadin Clinic. ~  EKG 4/13 showed AFib, rate82, LAD, NSSTTWA...  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 ~  CXR 1/09 showed unchanged right PA aneurysm, stable cardiomeg, elevated left hemidiaph w/ atx. ~  CXR 6/10 showed no change, Ao tortuous and calcif, NAD. ~  CXR 12/11 showed stable elv left hemidiaph, bibasilar atx/ scarring, cardiomeg, ectatic ao, right PA enlargement. ~  CXR 6/13 showed mod cardiomeg unchanged, ectasia & calcif in Ao, enlarged right pulm art (no change), elev left hemidiaph w/ bibasilar atx, NAD..Marland Kitchen ~  CXR 6/14 showed borderline cardiomeg, tortAo, PA dilatation is the same (no change), elev left hemidiaph, scarring at bases, NAD. ~  CXR 6/15 showed similar prominent PA segment, mild cardiomeg & enlarged desc thor Ao, clear lungs w/ elev left hemidiaph (stable), DJD in Tspine.  ASPVD >> on Coumadin for his AFib... ~  DrHochrein ordered CDopplers 12/13> mild heterogeneous plaque on right & mixed irreg plaque on left, 0-39% bilat ICA stenoses & antegrade vertebral flow; f/u 146yr.  HYPERCHOLESTEROLEMIA (ICD-272.0) - now on LIPITOR '20mg'$ /d (prev Simva40 changed due to  concomitant Verap). ~  [place] 1/09 on Simva40 showed TChol 95, TG 39, HDL 36, LDL 51 ~  FLP 12/09 on Simva40 showed TChol 167, TG 68, HDL 64, LDL 90 ~  FLP 1/11 on Simva40 showed TChol 153, TG 101, HDL 50, LDL 83... later ch to [place]. ~  [place] 6/12 on Lip20 showed TChol 162, TG 95, HDL 46, LDL 97 ~  6/13: not fasting for FLP today... ~  6/14: FLP on Lip20 showed TChol 150, TG 89, HDL 51, LDL 81... ~  FLP 6/15 on Lip20 showed TChol 147, TG 82, HDL 55, LDL 76...  HEMORRHOIDS (ICD-455.6)  RENAL INSUFFICIENCY (ICD-588.9)  ~  Creat=1.4 - 1.6 during 1/09 hosp... ~  labs 6/09 & 9/09 showed Creat= 1.3 ~  labs 12/09 showed BUN= 20, Creat= 1.3, K= 4.0 ~  labs 6/10 showed BUN= 24, Creat= 1.3, K= 3.8 ~  labs  12/10 showed BUN= 12, Creat= 1.2, K= 3.6 (on 6 K20/d) ~  labs 4/11 showed BUN= 18, Creat= 1.4, K= 4.0 (on 4 KCl/d) ~  labs 12/11 showed BUN= 32, Creat= 1.8, K= 4.3 ~  Labs 6/12 showed BUN= 27, creat= 1.6, K= 4.1 ~  Labs 12/12 showed BUN=21, Creat=1.6, K=3.9 ~  Labs 6/13 showed BUN= 30, Creat= 1.5, K= 4.5 ~  Labs 6/14 showed BUN= 26, Cr= 1.5, K= 3.5 (encouraged to take the KCl 20mq- 3/d)... ~  Labs 6/15 showed BUN= 25, Cr= 1.6, K= 3.9  BENIGN PROSTATIC HYPERTROPHY, HX OF (ICD-V13.8) - followed by DrTannenbaum & taking FLOMAX 0.'4mg'$ /d... last seen 5/09 and his note is reviewed... ~  labs 12/09 showed PSA= 1.42 ~  labs 2/11 showed PSA= 1.65 ~  Labs 6/13 showed PSA= 1.58 ~  10/13: he had Urology f/u DrTannenbaum> BPH w/ BOO, nocturia, mild Uincont; he didn't want any intervention & no changes made to his regimen...  CVA (ICD-434.91) - Stroke in 1990 in the setting of HBP...  DEGENERATIVE JOINT DISEASE, GENERALIZED (ICD-715.00) - he uses OTC pain meds & TRAMADOL '50mg'$  Prn...  GOUT (ICD-274.9) - on ALLOPURINOL '100mg'$ /d,  & now off the Colchicine... ~  labs 10/08 showed Uric= 10.3 ~  labs 12/09 showed Uric= 7.7..Marland Kitchen he wants to continue same meds. ~  labs 12/11 showed Uric = 6.4 ~  Labs 6/15 showed Uric= 6.2  INGUINAL HERNIA (ICD-550.90)  Health Maintenance - he receives the yearly seasonal Flu vaccines... f/u PNEUMOVAX at his request 2010...   Past Surgical History  Procedure Laterality Date  . Appendectomy    . Inguinal hernia repair    . Cataract extraction    . Cosmetic eye surgery      Outpatient Encounter Prescriptions as of 11/16/2015  Medication Sig  . allopurinol (ZYLOPRIM) 100 MG tablet TAKE 1 TABLET BY MOUTH DAILY  . atorvastatin (LIPITOR) 20 MG tablet TAKE 1 TABLET BY MOUTH DAILY  . bimatoprost (LUMIGAN) 0.03 % ophthalmic solution Place 1 drop into both eyes at bedtime.    . brinzolamide (AZOPT) 1 % ophthalmic suspension Place 1 drop into both eyes 2 (two) times daily.     . Cholecalciferol (VITAMIN D) 2000 UNITS CAPS Take 1 capsule by mouth daily.    .Marland Kitcheneplerenone (INSPRA) 25 MG tablet TAKE 1 TABLET BY MOUTH EVERY DAY  . meclizine (ANTIVERT) 25 MG tablet Take 1 tablet (25 mg total) by mouth 3 (three) times daily as needed.  . metoprolol succinate (TOPROL-XL) 100 MG 24 hr tablet Take 1 tablet (100 mg total) by mouth daily. Take with  or immediately following a meal.  . metoprolol succinate (TOPROL-XL) 100 MG 24 hr tablet TAKE 1 TABLET BY MOUTH DAILY. TAKE WITH OR IMMEDIATELY FOLLOWING A MEAL.  . pilocarpine (PILOCAR) 4 % ophthalmic solution 1 drop into left eye every 4 hrs as needed  . Tamsulosin HCl (FLOMAX) 0.4 MG CAPS Take 0.4 mg by mouth daily.    Marland Kitchen telmisartan (MICARDIS) 80 MG tablet TAKE 1 TABLET BY MOUTH DAILY  . torsemide (DEMADEX) 20 MG tablet TAKE 2 TABLETS BY MOUTH EVERY MORNING AND TAKE 1 TABLET BY MOUTH EVERY EVENING  . torsemide (DEMADEX) 20 MG tablet Take 2 tablets by mouth every morning and 1 tablet by mouth in the evening Overdue for yearly physical w/labs must see Marya Amsler for refills  . torsemide (DEMADEX) 20 MG tablet Take 2 tablets by mouth every morning and 1 tablet in the evening Overdue for follow-up appt must see Marya Amsler for refills  . verapamil (CALAN-SR) 120 MG CR tablet TAKE 1 TABLET BY MOUTH EVERY NIGHT AT BEDTIME  . warfarin (COUMADIN) 5 MG tablet Take 1 tablet by mouth daily or as directed by coumadin clinic  . [DISCONTINUED] cloNIDine (CATAPRES) 0.2 MG tablet TAKE 1 TABLET(0.2 MG) BY MOUTH TWICE DAILY  . [DISCONTINUED] potassium chloride SA (K-DUR,KLOR-CON) 20 MEQ tablet TAKE 3 TABLETS BY MOUTH DAILY  . [DISCONTINUED] atorvastatin (LIPITOR) 20 MG tablet TAKE 1 TABLET BY MOUTH DAILY  . [DISCONTINUED] verapamil (CALAN-SR) 120 MG CR tablet TAKE 1 TABLET BY MOUTH EVERY NIGHT AT BEDTIME   No facility-administered encounter medications on file as of 11/16/2015.    Allergies  Allergen Reactions  . Ibuprofen     REACTION: causes low blood  pressure    Current Medications, Allergies, Past Medical History, Past Surgical History, Family History, and Social History were reviewed in [employer] record.    Review of Systems        See HPI - all other systems neg except as noted... The patient complains of decreased hearing, dyspnea on exertion, peripheral edema, and difficulty walking.  The patient denies anorexia, fever, weight loss, weight gain, vision loss, hoarseness, chest pain, syncope, prolonged cough, headaches, hemoptysis, abdominal pain, melena, hematochezia, severe indigestion/heartburn, hematuria, incontinence, muscle weakness, suspicious skin lesions, transient blindness, depression, unusual weight change, abnormal bleeding, enlarged lymph nodes, and angioedema.   Objective:   Physical Exam     WD, WN, 82 y/o BM in NAD... GENERAL:  Alert & oriented; pleasant & cooperative... HEENT:  [place]/AT, EOM-wnl, PERRLA, EACs-clear, TMs-wnl, NOSE-clear, THROAT-clear & wnl. NECK:  Supple w/ fairROM; no JVD; sl decr carotid impulses w/ transmit murmur; no thyromegaly or nodules palpated; no lymphadenopathy. CHEST:  Clear to P & A; without wheezes/ rales/ or rhonchi heard... HEART:  sl irreg, gr 2/6 AS murmur at base, without rubs or gallops apprec... ABDOMEN:  Soft & nontender; normal bowel sounds; no organomegaly or masses detected. EXT: without deformities, mild arthritic changes; no varicose veins/ +venous insuffic/ tr edema. NEURO:  CN's intact; no focal neuro deficits... DERM:  No lesions noted; no rash etc...  RADIOLOGY DATA:  Reviewed in the EPIC EMR & discussed w/ the patient...  LABORATORY DATA:  Reviewed in the EPIC EMR & discussed w/ the patient...   Assessment & Plan:    HBP>  Adeq control on his extensive med regimen; we reviewed meds w/ family- they need to supervise & be sure he takes everything regularly...  CHF>  Stable on current meds; renal w/ Creat=1.75 stable as well.Marland KitchenMarland Kitchen  AS>  Mod  AS on 2DEcho & followed by DrHochrein.  AFib>  He remains on Coumadin followed in the CC...  PA art aneurysm & Pulm HTN>  Stable on current med regimen & BP control...  CHOL>  FLP looks good on Lip20...  Renal Insuffic>  Creat improved to 1.5-1.8 & stable on current meds...  DJD/ Gout>  Stable as well, c/o some rib pain as noted; we discussed rest/ heat/ rib binder & Tramadol...  Other medical problems as noted...   Patient's Medications  New Prescriptions   No medications on file  Previous Medications   ALLOPURINOL (ZYLOPRIM) 100 MG TABLET    TAKE 1 TABLET BY MOUTH DAILY   ATORVASTATIN (LIPITOR) 20 MG TABLET    TAKE 1 TABLET BY MOUTH DAILY   BIMATOPROST (LUMIGAN) 0.03 % OPHTHALMIC SOLUTION    Place 1 drop into both eyes at bedtime.     BRINZOLAMIDE (AZOPT) 1 % OPHTHALMIC SUSPENSION    Place 1 drop into both eyes 2 (two) times daily.     CHOLECALCIFEROL (VITAMIN D) 2000 UNITS CAPS    Take 1 capsule by mouth daily.     EPLERENONE (INSPRA) 25 MG TABLET    TAKE 1 TABLET BY MOUTH EVERY DAY   MECLIZINE (ANTIVERT) 25 MG TABLET    Take 1 tablet (25 mg total) by mouth 3 (three) times daily as needed.   METOPROLOL SUCCINATE (TOPROL-XL) 100 MG 24 HR TABLET    Take 1 tablet (100 mg total) by mouth daily. Take with or immediately following a meal.   METOPROLOL SUCCINATE (TOPROL-XL) 100 MG 24 HR TABLET    TAKE 1 TABLET BY MOUTH DAILY. TAKE WITH OR IMMEDIATELY FOLLOWING A MEAL.   PILOCARPINE (PILOCAR) 4 % OPHTHALMIC SOLUTION    1 drop into left eye every 4 hrs as needed   TAMSULOSIN HCL (FLOMAX) 0.4 MG CAPS    Take 0.4 mg by mouth daily.     TELMISARTAN (MICARDIS) 80 MG TABLET    TAKE 1 TABLET BY MOUTH DAILY   TORSEMIDE (DEMADEX) 20 MG TABLET    TAKE 2 TABLETS BY MOUTH EVERY MORNING AND TAKE 1 TABLET BY MOUTH EVERY EVENING   TORSEMIDE (DEMADEX) 20 MG TABLET    Take 2 tablets by mouth every morning and 1 tablet by mouth in the evening Overdue for yearly physical w/labs must see Marya Amsler for refills    TORSEMIDE (DEMADEX) 20 MG TABLET    Take 2 tablets by mouth every morning and 1 tablet in the evening Overdue for follow-up appt must see Marya Amsler for refills   VERAPAMIL (CALAN-SR) 120 MG CR TABLET    TAKE 1 TABLET BY MOUTH EVERY NIGHT AT BEDTIME   WARFARIN (COUMADIN) 5 MG TABLET    Take 1 tablet by mouth daily or as directed by coumadin clinic  Modified Medications   Modified Medication Previous Medication   CLONIDINE (CATAPRES) 0.2 MG TABLET cloNIDine (CATAPRES) 0.2 MG tablet      TAKE 1 TABLET(0.2 MG) BY MOUTH TWICE DAILY    TAKE 1 TABLET(0.2 MG) BY MOUTH TWICE DAILY   POTASSIUM CHLORIDE SA (K-DUR,KLOR-CON) 20 MEQ TABLET potassium chloride SA (K-DUR,KLOR-CON) 20 MEQ tablet      TAKE 3 TABLETS BY MOUTH DAILY    TAKE 3 TABLETS BY MOUTH DAILY   TORSEMIDE (DEMADEX) 20 MG TABLET torsemide (DEMADEX) 20 MG tablet      TAKE 2 TABLET BY MOUTH EVERY MORNING AND 1 TABLET IN THE EVENING    TAKE 2  TABLET BY MOUTH EVERY MORNING AND 1 TABLET IN THE EVENING  Discontinued Medications   No medications on file

## 2016-01-13 NOTE — Patient Instructions (Signed)
Today we updated your med list in our EPIC system...    Continue your current medications the same...  Please wear your OXYGEN at 2L/min flow rate & wear it 24/7...  We will make a referral to [place] at the CHF/ [facility] clinic at [facility] for his input...  We will make a referral to Home Care at your request to see what you might be eligible for in the way of home care...  Call for any questions...  Let's plan a follow up visit in 85mo, sooner if needed for acute problems.Marland KitchenMarland Kitchen

## 2016-01-14 DIAGNOSIS — I509 Heart failure, unspecified: Secondary | ICD-10-CM | POA: Diagnosis not present

## 2016-01-16 ENCOUNTER — Other Ambulatory Visit: Payer: Self-pay | Admitting: Family

## 2016-01-20 DIAGNOSIS — M545 Low back pain: Secondary | ICD-10-CM | POA: Diagnosis not present

## 2016-01-21 ENCOUNTER — Telehealth (HOSPITAL_COMMUNITY): Payer: Self-pay | Admitting: Vascular Surgery

## 2016-01-21 NOTE — Telephone Encounter (Signed)
Ok to schedule, next available.

## 2016-01-24 NOTE — Telephone Encounter (Signed)
Encounter open in error 

## 2016-01-26 ENCOUNTER — Other Ambulatory Visit: Payer: Self-pay | Admitting: Family

## 2016-02-02 ENCOUNTER — Other Ambulatory Visit: Payer: Self-pay | Admitting: Family

## 2016-02-02 ENCOUNTER — Telehealth: Payer: Self-pay | Admitting: Pulmonary Disease

## 2016-02-02 NOTE — Telephone Encounter (Signed)
Spoke with pt and he states that the other day he soaked his feet in a Clorox bath. He reports that his skin was burned by the Clorox, he thinks he "put in too much". He has applied Vaseline and white socks. He reports that his skin is oozing "water" but not painful. He is able to walk on them. Pt has not contacted PCP as he would like to know what your recommendation would be.   SN Please advise. Thanks!  Allergies  Allergen Reactions  . Ibuprofen     REACTION: causes low blood pressure    Current Outpatient Prescriptions on File Prior to Visit  Medication Sig Dispense Refill  . allopurinol (ZYLOPRIM) 100 MG tablet TAKE 1 TABLET BY MOUTH DAILY 90 tablet 0  . atorvastatin (LIPITOR) 20 MG tablet TAKE 1 TABLET BY MOUTH DAILY 30 tablet 8  . bimatoprost (LUMIGAN) 0.03 % ophthalmic solution Place 1 drop into both eyes at bedtime.      . brinzolamide (AZOPT) 1 % ophthalmic suspension Place 1 drop into both eyes 2 (two) times daily.      . Cholecalciferol (VITAMIN D) 2000 UNITS CAPS Take 1 capsule by mouth daily.      . cloNIDine (CATAPRES) 0.2 MG tablet TAKE 1 TABLET(0.2 MG) BY MOUTH TWICE DAILY 180 tablet 0  . eplerenone (INSPRA) 25 MG tablet TAKE 1 TABLET BY MOUTH EVERY DAY 90 tablet 0  . meclizine (ANTIVERT) 25 MG tablet Take 1 tablet (25 mg total) by mouth 3 (three) times daily as needed. 50 tablet 5  . metoprolol succinate (TOPROL-XL) 100 MG 24 hr tablet Take 1 tablet (100 mg total) by mouth daily. Take with or immediately following a meal. 30 tablet 5  . pilocarpine (PILOCAR) 4 % ophthalmic solution 1 drop into left eye every 4 hrs as needed    . potassium chloride SA (K-DUR,KLOR-CON) 20 MEQ tablet TAKE 3 TABLETS BY MOUTH DAILY 270 tablet 1  . Tamsulosin HCl (FLOMAX) 0.4 MG CAPS Take 0.4 mg by mouth daily.      Marland Kitchen telmisartan (MICARDIS) 80 MG tablet TAKE 1 TABLET BY MOUTH DAILY 90 tablet 0  . torsemide (DEMADEX) 20 MG tablet TAKE 2 TABLETS BY MOUTH EVERY MORNING AND TAKE 1 TABLET BY MOUTH  EVERY EVENING 270 tablet 1  . torsemide (DEMADEX) 20 MG tablet TAKE 2 TABLET BY MOUTH EVERY MORNING AND 1 TABLET IN THE EVENING 90 tablet 0  . verapamil (CALAN-SR) 120 MG CR tablet TAKE 1 TABLET BY MOUTH EVERY NIGHT AT BEDTIME 30 tablet 8  . warfarin (COUMADIN) 5 MG tablet Take 1 tablet by mouth daily or as directed by coumadin clinic 90 tablet 1   No current facility-administered medications on file prior to visit.

## 2016-02-02 NOTE — Telephone Encounter (Signed)
Per SN: Pt needs to contact PCP as soon as possible as this may be chemical burn. In meantime, pt can apply Neosporin ointment, clean/dry/white socks and elevate as much as possible.  Spoke with pt and advised. He voiced understanding and will call PCP office as soon as possible. Nothing further needed.

## 2016-02-02 NOTE — Telephone Encounter (Signed)
Pt is having swelling in his legs needs to know what to do [phone]

## 2016-02-03 ENCOUNTER — Encounter: Payer: Self-pay | Admitting: Family

## 2016-02-03 ENCOUNTER — Other Ambulatory Visit: Payer: Medicare Other

## 2016-02-03 ENCOUNTER — Telehealth: Payer: Self-pay | Admitting: Pulmonary Disease

## 2016-02-03 ENCOUNTER — Ambulatory Visit (INDEPENDENT_AMBULATORY_CARE_PROVIDER_SITE_OTHER): Payer: Medicare Other | Admitting: Family

## 2016-02-03 ENCOUNTER — Other Ambulatory Visit: Payer: Self-pay | Admitting: Family

## 2016-02-03 VITALS — BP 144/84 | HR 89 | Temp 97.8°F | Ht 71.0 in | Wt 191.0 lb

## 2016-02-03 DIAGNOSIS — Z7901 Long term (current) use of anticoagulants: Secondary | ICD-10-CM | POA: Diagnosis not present

## 2016-02-03 DIAGNOSIS — L03119 Cellulitis of unspecified part of limb: Secondary | ICD-10-CM | POA: Diagnosis not present

## 2016-02-03 DIAGNOSIS — M150 Primary generalized (osteo)arthritis: Secondary | ICD-10-CM | POA: Diagnosis not present

## 2016-02-03 DIAGNOSIS — Z9981 Dependence on supplemental oxygen: Secondary | ICD-10-CM | POA: Diagnosis not present

## 2016-02-03 DIAGNOSIS — N259 Disorder resulting from impaired renal tubular function, unspecified: Secondary | ICD-10-CM | POA: Diagnosis not present

## 2016-02-03 DIAGNOSIS — I359 Nonrheumatic aortic valve disorder, unspecified: Secondary | ICD-10-CM | POA: Diagnosis not present

## 2016-02-03 DIAGNOSIS — I481 Persistent atrial fibrillation: Secondary | ICD-10-CM | POA: Diagnosis not present

## 2016-02-03 DIAGNOSIS — H542 Low vision, both eyes: Secondary | ICD-10-CM | POA: Diagnosis not present

## 2016-02-03 DIAGNOSIS — H409 Unspecified glaucoma: Secondary | ICD-10-CM | POA: Diagnosis not present

## 2016-02-03 DIAGNOSIS — I281 Aneurysm of pulmonary artery: Secondary | ICD-10-CM | POA: Diagnosis not present

## 2016-02-03 DIAGNOSIS — I272 Other secondary pulmonary hypertension: Secondary | ICD-10-CM | POA: Diagnosis not present

## 2016-02-03 DIAGNOSIS — I110 Hypertensive heart disease with heart failure: Secondary | ICD-10-CM | POA: Diagnosis not present

## 2016-02-03 DIAGNOSIS — I5032 Chronic diastolic (congestive) heart failure: Secondary | ICD-10-CM | POA: Diagnosis not present

## 2016-02-03 DIAGNOSIS — M109 Gout, unspecified: Secondary | ICD-10-CM | POA: Diagnosis not present

## 2016-02-03 MED ORDER — MUPIROCIN 2 % EX OINT: 1.0000 "application " | TOPICAL_OINTMENT | Freq: Two times a day (BID) | CUTANEOUS | Status: DC

## 2016-02-03 MED ORDER — DOXYCYCLINE HYCLATE 100 MG PO TABS: 100.0000 mg | ORAL_TABLET | Freq: Two times a day (BID) | ORAL | Status: DC

## 2016-02-03 NOTE — Progress Notes (Signed)
Subjective:    Patient ID: [name] [name], male    DOB: 1926/06/13, 82 y.o.   MRN: [id number]   [name] [name] is a 82 y.o. male who presents today for an acute visit.    HPI Comments: Patient for evaluation of bilateral and discoloration in LE, over past week.  Accompanied by daughter. Thought he had a mosquito bite on LLE and tried medicated Vaseline.  Recently soaked feet in clorox which he has done for years however daughter things unrelated. Patient has a history of congestive heart failure. No worsening SOB, at baseline. No cough, wheezing, orthopnea. No fever or chills.  Wears 2L O2 at home.  No h/o MRSA.    Past Medical History  Diagnosis Date  . Unspecified essential hypertension   . Congestive heart failure, unspecified   . Atrial fibrillation ([place])   . 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)   . Pure hypercholesterolemia   . Unspecified hemorrhoids without mention of complication   . Unspecified cerebral artery occlusion with cerebral infarction   . Generalized osteoarthrosis, unspecified site   . Gout, unspecified   . Pulmonary hypertension ([place])   . Aortic stenosis   . BPH (benign prostatic hypertrophy)   . CVA (cerebral infarction)   . DJD (degenerative joint disease)   . Renal insufficiency    Allergies: Ibuprofen Current Outpatient Prescriptions on File Prior to Visit  Medication Sig Dispense Refill  . allopurinol (ZYLOPRIM) 100 MG tablet Take 1 tablet (100 mg total) by mouth daily. Overdue for yearly physical w/labs must see MD for refills 30 tablet 0  . atorvastatin (LIPITOR) 20 MG tablet TAKE 1 TABLET BY MOUTH DAILY 30 tablet 8  . bimatoprost (LUMIGAN) 0.03 % ophthalmic solution Place 1 drop into both eyes at bedtime.      . brinzolamide (AZOPT) 1 % ophthalmic suspension Place 1 drop into both eyes 2 (two) times daily.      . Cholecalciferol (VITAMIN D) 2000 UNITS CAPS Take 1 capsule by mouth daily.      . cloNIDine (CATAPRES) 0.2 MG tablet TAKE 1  TABLET(0.2 MG) BY MOUTH TWICE DAILY 180 tablet 0  . eplerenone (INSPRA) 25 MG tablet TAKE 1 TABLET BY MOUTH EVERY DAY 90 tablet 0  . meclizine (ANTIVERT) 25 MG tablet Take 1 tablet (25 mg total) by mouth 3 (three) times daily as needed. 50 tablet 5  . metoprolol succinate (TOPROL-XL) 100 MG 24 hr tablet Take 1 tablet (100 mg total) by mouth daily. Take with or immediately following a meal. 30 tablet 5  . pilocarpine (PILOCAR) 4 % ophthalmic solution 1 drop into left eye every 4 hrs as needed    . potassium chloride SA (K-DUR,KLOR-CON) 20 MEQ tablet TAKE 3 TABLETS BY MOUTH DAILY 270 tablet 1  . Tamsulosin HCl (FLOMAX) 0.4 MG CAPS Take 0.4 mg by mouth daily.      Marland Kitchen telmisartan (MICARDIS) 80 MG tablet TAKE 1 TABLET BY MOUTH DAILY 90 tablet 0  . torsemide (DEMADEX) 20 MG tablet TAKE 2 TABLETS BY MOUTH EVERY MORNING AND TAKE 1 TABLET BY MOUTH EVERY EVENING 270 tablet 1  . verapamil (CALAN-SR) 120 MG CR tablet TAKE 1 TABLET BY MOUTH EVERY NIGHT AT BEDTIME 30 tablet 8  . warfarin (COUMADIN) 5 MG tablet Take 1 tablet by mouth daily or as directed by coumadin clinic 90 tablet 1   No current facility-administered medications on file prior to visit.    Social History  Substance Use Topics  . Smoking  status: Former Smoker -- 3 years    Types: Cigarettes, Cigars    Quit date: 12/05/2003  . Smokeless tobacco: Never Used     Comment: does not smoke cigars anymore  . Alcohol Use: None     Comment: rarely    Review of Systems  Constitutional: Negative for fever and chills.  HENT: Negative for congestion, ear pain, rhinorrhea, sinus pressure and sore throat.   Respiratory: Negative for cough, shortness of breath and wheezing.   Cardiovascular: Positive for leg swelling. Negative for chest pain and palpitations.  Gastrointestinal: Negative for nausea, vomiting and diarrhea.  Genitourinary: Negative for dysuria.  Musculoskeletal: Negative for myalgias.  Skin: Positive for rash and wound.    Neurological: Negative for headaches.  Hematological: Negative for adenopathy.      Objective:    BP 144/84 mmHg  Pulse 89  Temp(Src) 97.8 F (36.6 C) (Oral)  Ht 5\' 11"  (1.803 m)  Wt 191 lb (86.637 kg)  BMI 26.65 kg/m2  SpO2 93%   Physical Exam  Constitutional: He appears well-developed and well-nourished.  Cardiovascular: Regular rhythm and normal heart sounds.   Nonpitting +1  BLE edema. No palpable cords or masses. Darkened discoloration symmetric BLE. Mild increased warmth. Small punctate lesions seeping purulent fluid.  Negative Homan sign bilaterally. No cords appreciated or pain in calves. LE warm and palpable pedal pulses.   Pulmonary/Chest: Effort normal and breath sounds normal. No respiratory distress. He has no wheezes. He has no rhonchi. He has no rales.  Lymphadenopathy:       Head (left side): No submandibular and no preauricular adenopathy present.  Neurological: He is alert.  Skin: Skin is warm and dry.  Psychiatric: He has a normal mood and affect. His speech is normal and behavior is normal.  Vitals reviewed.      Assessment & Plan:   1. Cellulitis of lower extremity, unspecified laterality Purulent BLE cellulitis. Afebrile. Patient is well appearing. Covering with MRSA agent. Advised patient and daughter to move INR appointment to Monday of next week due to interaction with doxycycline. Also strongly encouraged close vigilance and if infection appears to be worsening in any way, I have asked patient to go immediately to emergency room for IV antibiotics. Patient and daughter agree with this plan. CMA dressed bilateral lower extremities with Bactroban and nonadhesive dressing. No shortness of breath, cough, crackles to suggest CHF exacerbation. No calf swelling, cords appreciated to suggest DVT. Furthermore, patient is on Coumadin.  -wound culture - doxycycline (VIBRA-TABS) 100 MG tablet; Take 1 tablet (100 mg total) by mouth 2 (two) times daily.  Dispense:  20 tablet; Refill: 0    I am having [name] [name] maintain his tamsulosin, bimatoprost, Vitamin D, brinzolamide, pilocarpine, meclizine, metoprolol succinate, torsemide, warfarin, atorvastatin, verapamil, cloNIDine, potassium chloride SA, telmisartan, eplerenone, allopurinol, and LUMIGAN.   Meds ordered this encounter  Medications  . LUMIGAN 0.01 % SOLN    Sig: INT 1 GTT IN OU Q NIGHT    Refill:  11     Start medications as prescribed and explained to patient on After Visit Summary ( AVS). Risks, benefits, and alternatives of the medications and treatment plan prescribed today were discussed, and patient expressed understanding.   Education regarding symptom management and diagnosis given to patient.   Follow-up:Plan follow-up and return precautions given if any worsening symptoms or change in condition.   Continue to follow with Mauricio Po, FNP for routine health maintenance.   Geoffery Lyons and I agreed with  plan.   Mable Paris, FNP

## 2016-02-03 NOTE — Telephone Encounter (Signed)
Spoke with [employer] [facility] @ [facility]. Pt has been in [place] until yesterday so they have not been able to start home health. Arbie Cookey states she went to home yesterday for evaluation and pt is very unsteady and having a hard time getting around. They will not be able to provide a home health nurse without conjointly providing PT/OT due to pt being so unsteady. She would like verbal order for PT/OT evaluation and treatment.   SN Please advise. Thanks!   Allergies  Allergen Reactions  . Ibuprofen     REACTION: causes low blood pressure    Current Outpatient Prescriptions on File Prior to Visit  Medication Sig Dispense Refill  . allopurinol (ZYLOPRIM) 100 MG tablet Take 1 tablet (100 mg total) by mouth daily. Overdue for yearly physical w/labs must see MD for refills 30 tablet 0  . atorvastatin (LIPITOR) 20 MG tablet TAKE 1 TABLET BY MOUTH DAILY 30 tablet 8  . bimatoprost (LUMIGAN) 0.03 % ophthalmic solution Place 1 drop into both eyes at bedtime.      . brinzolamide (AZOPT) 1 % ophthalmic suspension Place 1 drop into both eyes 2 (two) times daily.      . Cholecalciferol (VITAMIN D) 2000 UNITS CAPS Take 1 capsule by mouth daily.      . cloNIDine (CATAPRES) 0.2 MG tablet TAKE 1 TABLET(0.2 MG) BY MOUTH TWICE DAILY 180 tablet 0  . eplerenone (INSPRA) 25 MG tablet TAKE 1 TABLET BY MOUTH EVERY DAY 90 tablet 0  . LUMIGAN 0.01 % SOLN INT 1 GTT IN OU Q NIGHT  11  . meclizine (ANTIVERT) 25 MG tablet Take 1 tablet (25 mg total) by mouth 3 (three) times daily as needed. 50 tablet 5  . metoprolol succinate (TOPROL-XL) 100 MG 24 hr tablet Take 1 tablet (100 mg total) by mouth daily. Take with or immediately following a meal. 30 tablet 5  . pilocarpine (PILOCAR) 4 % ophthalmic solution 1 drop into left eye every 4 hrs as needed    . potassium chloride SA (K-DUR,KLOR-CON) 20 MEQ tablet TAKE 3 TABLETS BY MOUTH DAILY 270 tablet 1  . Tamsulosin HCl (FLOMAX) 0.4 MG CAPS Take 0.4 mg by mouth daily.      Marland Kitchen  telmisartan (MICARDIS) 80 MG tablet TAKE 1 TABLET BY MOUTH DAILY 90 tablet 0  . torsemide (DEMADEX) 20 MG tablet TAKE 2 TABLETS BY MOUTH EVERY MORNING AND TAKE 1 TABLET BY MOUTH EVERY EVENING 270 tablet 1  . verapamil (CALAN-SR) 120 MG CR tablet TAKE 1 TABLET BY MOUTH EVERY NIGHT AT BEDTIME 30 tablet 8  . warfarin (COUMADIN) 5 MG tablet Take 1 tablet by mouth daily or as directed by coumadin clinic 90 tablet 1   No current facility-administered medications on file prior to visit.

## 2016-02-03 NOTE — Patient Instructions (Signed)
Please INR checked by Monday at the latest.  Watch infection and if doesn't improve on antibiotic, please go to ED immediately.   If there is no improvement in your symptoms, or if there is any worsening of symptoms, or if you have any additional concerns, please return for re-evaluation; or, if we are closed, consider going to the Emergency Room for evaluation if symptoms urgent.  Cellulitis Cellulitis is an infection of the skin and the tissue beneath it. The infected area is usually red and tender. Cellulitis occurs most often in the arms and lower legs.  CAUSES  Cellulitis is caused by bacteria that enter the skin through cracks or cuts in the skin. The most common types of bacteria that cause cellulitis are staphylococci and streptococci. SIGNS AND SYMPTOMS   Redness and warmth.  Swelling.  Tenderness or pain.  Fever. DIAGNOSIS  Your health care provider can usually determine what is wrong based on a physical exam. Blood tests may also be done. TREATMENT  Treatment usually involves taking an antibiotic medicine. HOME CARE INSTRUCTIONS   Take your antibiotic medicine as directed by your health care provider. Finish the antibiotic even if you start to feel better.  Keep the infected arm or leg elevated to reduce swelling.  Apply a warm cloth to the affected area up to 4 times per day to relieve pain.  Take medicines only as directed by your health care provider.  Keep all follow-up visits as directed by your health care provider. SEEK MEDICAL CARE IF:   You notice red streaks coming from the infected area.  Your red area gets larger or turns dark in color.  Your bone or joint underneath the infected area becomes painful after the skin has healed.  Your infection returns in the same area or another area.  You notice a swollen bump in the infected area.  You develop new symptoms.  You have a fever. SEEK IMMEDIATE MEDICAL CARE IF:   You feel very sleepy.  You  develop vomiting or diarrhea.  You have a general ill feeling (malaise) with muscle aches and pains.   This information is not intended to replace advice given to you by your health care provider. Make sure you discuss any questions you have with your health care provider.   Document Released: 05/03/2005 Document Revised: 04/14/2015 Document Reviewed: 10/09/2011 Elsevier Interactive Patient Education [employer].

## 2016-02-03 NOTE — Progress Notes (Signed)
Pre visit review using our clinic review tool, if applicable. No additional management support is needed unless otherwise documented below in the visit note. 

## 2016-02-03 NOTE — Telephone Encounter (Signed)
Per SN okay for VO.  Called spoke with Arbie Cookey and made aware. Nothing further needed

## 2016-02-04 DIAGNOSIS — I481 Persistent atrial fibrillation: Secondary | ICD-10-CM | POA: Diagnosis not present

## 2016-02-04 DIAGNOSIS — I110 Hypertensive heart disease with heart failure: Secondary | ICD-10-CM | POA: Diagnosis not present

## 2016-02-04 DIAGNOSIS — I5032 Chronic diastolic (congestive) heart failure: Secondary | ICD-10-CM | POA: Diagnosis not present

## 2016-02-04 DIAGNOSIS — I272 Other secondary pulmonary hypertension: Secondary | ICD-10-CM | POA: Diagnosis not present

## 2016-02-04 DIAGNOSIS — N259 Disorder resulting from impaired renal tubular function, unspecified: Secondary | ICD-10-CM | POA: Diagnosis not present

## 2016-02-04 DIAGNOSIS — M150 Primary generalized (osteo)arthritis: Secondary | ICD-10-CM | POA: Diagnosis not present

## 2016-02-04 DIAGNOSIS — M109 Gout, unspecified: Secondary | ICD-10-CM | POA: Diagnosis not present

## 2016-02-04 DIAGNOSIS — Z7901 Long term (current) use of anticoagulants: Secondary | ICD-10-CM | POA: Diagnosis not present

## 2016-02-04 DIAGNOSIS — H542 Low vision, both eyes: Secondary | ICD-10-CM | POA: Diagnosis not present

## 2016-02-04 DIAGNOSIS — Z9981 Dependence on supplemental oxygen: Secondary | ICD-10-CM | POA: Diagnosis not present

## 2016-02-04 DIAGNOSIS — I359 Nonrheumatic aortic valve disorder, unspecified: Secondary | ICD-10-CM | POA: Diagnosis not present

## 2016-02-04 DIAGNOSIS — H409 Unspecified glaucoma: Secondary | ICD-10-CM | POA: Diagnosis not present

## 2016-02-04 DIAGNOSIS — I281 Aneurysm of pulmonary artery: Secondary | ICD-10-CM | POA: Diagnosis not present

## 2016-02-06 LAB — WOUND CULTURE
GRAM STAIN: NONE SEEN
GRAM STAIN: NONE SEEN
Gram Stain: NONE SEEN

## 2016-02-07 ENCOUNTER — Telehealth: Payer: Self-pay

## 2016-02-07 ENCOUNTER — Ambulatory Visit (INDEPENDENT_AMBULATORY_CARE_PROVIDER_SITE_OTHER): Payer: Medicare Other | Admitting: *Deleted

## 2016-02-07 DIAGNOSIS — I110 Hypertensive heart disease with heart failure: Secondary | ICD-10-CM | POA: Diagnosis not present

## 2016-02-07 DIAGNOSIS — Z9981 Dependence on supplemental oxygen: Secondary | ICD-10-CM | POA: Diagnosis not present

## 2016-02-07 DIAGNOSIS — I4891 Unspecified atrial fibrillation: Secondary | ICD-10-CM | POA: Diagnosis not present

## 2016-02-07 DIAGNOSIS — Z7901 Long term (current) use of anticoagulants: Secondary | ICD-10-CM

## 2016-02-07 DIAGNOSIS — I6350 Cerebral infarction due to unspecified occlusion or stenosis of unspecified cerebral artery: Secondary | ICD-10-CM | POA: Diagnosis not present

## 2016-02-07 DIAGNOSIS — N259 Disorder resulting from impaired renal tubular function, unspecified: Secondary | ICD-10-CM | POA: Diagnosis not present

## 2016-02-07 DIAGNOSIS — I5032 Chronic diastolic (congestive) heart failure: Secondary | ICD-10-CM | POA: Diagnosis not present

## 2016-02-07 DIAGNOSIS — I359 Nonrheumatic aortic valve disorder, unspecified: Secondary | ICD-10-CM | POA: Diagnosis not present

## 2016-02-07 DIAGNOSIS — I272 Other secondary pulmonary hypertension: Secondary | ICD-10-CM | POA: Diagnosis not present

## 2016-02-07 DIAGNOSIS — H409 Unspecified glaucoma: Secondary | ICD-10-CM | POA: Diagnosis not present

## 2016-02-07 DIAGNOSIS — I481 Persistent atrial fibrillation: Secondary | ICD-10-CM | POA: Diagnosis not present

## 2016-02-07 DIAGNOSIS — M109 Gout, unspecified: Secondary | ICD-10-CM | POA: Diagnosis not present

## 2016-02-07 DIAGNOSIS — I281 Aneurysm of pulmonary artery: Secondary | ICD-10-CM | POA: Diagnosis not present

## 2016-02-07 DIAGNOSIS — M150 Primary generalized (osteo)arthritis: Secondary | ICD-10-CM | POA: Diagnosis not present

## 2016-02-07 DIAGNOSIS — H542 Low vision, both eyes: Secondary | ICD-10-CM | POA: Diagnosis not present

## 2016-02-07 LAB — POCT INR: INR: 3.2

## 2016-02-07 NOTE — Telephone Encounter (Signed)
[name] (pt dtr) instructed on wound care. She stated that she wants to come back in and have [place] look at it.   Sending note to Almyra Free to add pt to [employer] schedule.

## 2016-02-07 NOTE — Telephone Encounter (Signed)
Patients daughter called and has some questions about the last time they was here. She has some questions about the wound care. And when they need to make a fu. Please advise or follow up.

## 2016-02-07 NOTE — Telephone Encounter (Signed)
I have a note from [place] to call pt back. I will call with the wound culture results and wound care.

## 2016-02-09 DIAGNOSIS — I281 Aneurysm of pulmonary artery: Secondary | ICD-10-CM | POA: Diagnosis not present

## 2016-02-09 DIAGNOSIS — I359 Nonrheumatic aortic valve disorder, unspecified: Secondary | ICD-10-CM | POA: Diagnosis not present

## 2016-02-09 DIAGNOSIS — H409 Unspecified glaucoma: Secondary | ICD-10-CM | POA: Diagnosis not present

## 2016-02-09 DIAGNOSIS — I110 Hypertensive heart disease with heart failure: Secondary | ICD-10-CM | POA: Diagnosis not present

## 2016-02-09 DIAGNOSIS — Z7901 Long term (current) use of anticoagulants: Secondary | ICD-10-CM | POA: Diagnosis not present

## 2016-02-09 DIAGNOSIS — I272 Other secondary pulmonary hypertension: Secondary | ICD-10-CM | POA: Diagnosis not present

## 2016-02-09 DIAGNOSIS — H542 Low vision, both eyes: Secondary | ICD-10-CM | POA: Diagnosis not present

## 2016-02-09 DIAGNOSIS — M109 Gout, unspecified: Secondary | ICD-10-CM | POA: Diagnosis not present

## 2016-02-09 DIAGNOSIS — Z9981 Dependence on supplemental oxygen: Secondary | ICD-10-CM | POA: Diagnosis not present

## 2016-02-09 DIAGNOSIS — I481 Persistent atrial fibrillation: Secondary | ICD-10-CM | POA: Diagnosis not present

## 2016-02-09 DIAGNOSIS — I5032 Chronic diastolic (congestive) heart failure: Secondary | ICD-10-CM | POA: Diagnosis not present

## 2016-02-09 DIAGNOSIS — N259 Disorder resulting from impaired renal tubular function, unspecified: Secondary | ICD-10-CM | POA: Diagnosis not present

## 2016-02-09 DIAGNOSIS — M150 Primary generalized (osteo)arthritis: Secondary | ICD-10-CM | POA: Diagnosis not present

## 2016-02-10 ENCOUNTER — Telehealth: Payer: Self-pay | Admitting: Pulmonary Disease

## 2016-02-10 ENCOUNTER — Telehealth (HOSPITAL_COMMUNITY): Payer: Self-pay | Admitting: Vascular Surgery

## 2016-02-10 DIAGNOSIS — Z7901 Long term (current) use of anticoagulants: Secondary | ICD-10-CM | POA: Diagnosis not present

## 2016-02-10 DIAGNOSIS — I272 Other secondary pulmonary hypertension: Secondary | ICD-10-CM | POA: Diagnosis not present

## 2016-02-10 DIAGNOSIS — H409 Unspecified glaucoma: Secondary | ICD-10-CM | POA: Diagnosis not present

## 2016-02-10 DIAGNOSIS — I281 Aneurysm of pulmonary artery: Secondary | ICD-10-CM | POA: Diagnosis not present

## 2016-02-10 DIAGNOSIS — M109 Gout, unspecified: Secondary | ICD-10-CM | POA: Diagnosis not present

## 2016-02-10 DIAGNOSIS — I110 Hypertensive heart disease with heart failure: Secondary | ICD-10-CM | POA: Diagnosis not present

## 2016-02-10 DIAGNOSIS — I5032 Chronic diastolic (congestive) heart failure: Secondary | ICD-10-CM | POA: Diagnosis not present

## 2016-02-10 DIAGNOSIS — H542 Low vision, both eyes: Secondary | ICD-10-CM | POA: Diagnosis not present

## 2016-02-10 DIAGNOSIS — I481 Persistent atrial fibrillation: Secondary | ICD-10-CM | POA: Diagnosis not present

## 2016-02-10 DIAGNOSIS — M150 Primary generalized (osteo)arthritis: Secondary | ICD-10-CM | POA: Diagnosis not present

## 2016-02-10 DIAGNOSIS — Z9981 Dependence on supplemental oxygen: Secondary | ICD-10-CM | POA: Diagnosis not present

## 2016-02-10 DIAGNOSIS — I359 Nonrheumatic aortic valve disorder, unspecified: Secondary | ICD-10-CM | POA: Diagnosis not present

## 2016-02-10 DIAGNOSIS — N259 Disorder resulting from impaired renal tubular function, unspecified: Secondary | ICD-10-CM | POA: Diagnosis not present

## 2016-02-10 NOTE — Telephone Encounter (Signed)
Spoke with Gracie from [place]. She is wanting a verbal order to continue home PT with the pt. Gracie would go to the pt's home 2 times per week for the next 6 weeks.  SN - please advise. Thanks.

## 2016-02-10 NOTE — Telephone Encounter (Signed)
Per SN >> yes this is fine.  lmtcb x1 for Gracie.

## 2016-02-10 NOTE — Telephone Encounter (Signed)
PT daughter called to cancel appt pt, does not want to be seen in HF clinic

## 2016-02-11 DIAGNOSIS — I481 Persistent atrial fibrillation: Secondary | ICD-10-CM | POA: Diagnosis not present

## 2016-02-11 DIAGNOSIS — H542 Low vision, both eyes: Secondary | ICD-10-CM | POA: Diagnosis not present

## 2016-02-11 DIAGNOSIS — I110 Hypertensive heart disease with heart failure: Secondary | ICD-10-CM | POA: Diagnosis not present

## 2016-02-11 DIAGNOSIS — I272 Other secondary pulmonary hypertension: Secondary | ICD-10-CM | POA: Diagnosis not present

## 2016-02-11 DIAGNOSIS — Z7901 Long term (current) use of anticoagulants: Secondary | ICD-10-CM | POA: Diagnosis not present

## 2016-02-11 DIAGNOSIS — M109 Gout, unspecified: Secondary | ICD-10-CM | POA: Diagnosis not present

## 2016-02-11 DIAGNOSIS — I359 Nonrheumatic aortic valve disorder, unspecified: Secondary | ICD-10-CM | POA: Diagnosis not present

## 2016-02-11 DIAGNOSIS — N259 Disorder resulting from impaired renal tubular function, unspecified: Secondary | ICD-10-CM | POA: Diagnosis not present

## 2016-02-11 DIAGNOSIS — M150 Primary generalized (osteo)arthritis: Secondary | ICD-10-CM | POA: Diagnosis not present

## 2016-02-11 DIAGNOSIS — I5032 Chronic diastolic (congestive) heart failure: Secondary | ICD-10-CM | POA: Diagnosis not present

## 2016-02-11 DIAGNOSIS — Z9981 Dependence on supplemental oxygen: Secondary | ICD-10-CM | POA: Diagnosis not present

## 2016-02-11 DIAGNOSIS — H409 Unspecified glaucoma: Secondary | ICD-10-CM | POA: Diagnosis not present

## 2016-02-11 DIAGNOSIS — I281 Aneurysm of pulmonary artery: Secondary | ICD-10-CM | POA: Diagnosis not present

## 2016-02-11 NOTE — Telephone Encounter (Signed)
Called and spoke with Terri Piedra, gave her approval per Dr. Lenna Gilford. Nothing further needed.

## 2016-02-11 NOTE — Telephone Encounter (Signed)
lmtcb x2 for Gracie.

## 2016-02-11 NOTE — Telephone Encounter (Signed)
[name] [name] returning call can be reached @ [phone].[name]

## 2016-02-13 ENCOUNTER — Other Ambulatory Visit: Payer: Self-pay | Admitting: Family

## 2016-02-13 DIAGNOSIS — I509 Heart failure, unspecified: Secondary | ICD-10-CM | POA: Diagnosis not present

## 2016-02-14 DIAGNOSIS — I281 Aneurysm of pulmonary artery: Secondary | ICD-10-CM | POA: Diagnosis not present

## 2016-02-14 DIAGNOSIS — H542 Low vision, both eyes: Secondary | ICD-10-CM | POA: Diagnosis not present

## 2016-02-14 DIAGNOSIS — I359 Nonrheumatic aortic valve disorder, unspecified: Secondary | ICD-10-CM | POA: Diagnosis not present

## 2016-02-14 DIAGNOSIS — Z9981 Dependence on supplemental oxygen: Secondary | ICD-10-CM | POA: Diagnosis not present

## 2016-02-14 DIAGNOSIS — Z7901 Long term (current) use of anticoagulants: Secondary | ICD-10-CM | POA: Diagnosis not present

## 2016-02-14 DIAGNOSIS — N259 Disorder resulting from impaired renal tubular function, unspecified: Secondary | ICD-10-CM | POA: Diagnosis not present

## 2016-02-14 DIAGNOSIS — I272 Other secondary pulmonary hypertension: Secondary | ICD-10-CM | POA: Diagnosis not present

## 2016-02-14 DIAGNOSIS — M109 Gout, unspecified: Secondary | ICD-10-CM | POA: Diagnosis not present

## 2016-02-14 DIAGNOSIS — M150 Primary generalized (osteo)arthritis: Secondary | ICD-10-CM | POA: Diagnosis not present

## 2016-02-14 DIAGNOSIS — H409 Unspecified glaucoma: Secondary | ICD-10-CM | POA: Diagnosis not present

## 2016-02-14 DIAGNOSIS — I481 Persistent atrial fibrillation: Secondary | ICD-10-CM | POA: Diagnosis not present

## 2016-02-14 DIAGNOSIS — I110 Hypertensive heart disease with heart failure: Secondary | ICD-10-CM | POA: Diagnosis not present

## 2016-02-14 DIAGNOSIS — I5032 Chronic diastolic (congestive) heart failure: Secondary | ICD-10-CM | POA: Diagnosis not present

## 2016-02-15 DIAGNOSIS — I481 Persistent atrial fibrillation: Secondary | ICD-10-CM | POA: Diagnosis not present

## 2016-02-15 DIAGNOSIS — I272 Other secondary pulmonary hypertension: Secondary | ICD-10-CM | POA: Diagnosis not present

## 2016-02-15 DIAGNOSIS — I359 Nonrheumatic aortic valve disorder, unspecified: Secondary | ICD-10-CM | POA: Diagnosis not present

## 2016-02-15 DIAGNOSIS — Z7901 Long term (current) use of anticoagulants: Secondary | ICD-10-CM | POA: Diagnosis not present

## 2016-02-15 DIAGNOSIS — H542 Low vision, both eyes: Secondary | ICD-10-CM | POA: Diagnosis not present

## 2016-02-15 DIAGNOSIS — M109 Gout, unspecified: Secondary | ICD-10-CM | POA: Diagnosis not present

## 2016-02-15 DIAGNOSIS — M150 Primary generalized (osteo)arthritis: Secondary | ICD-10-CM | POA: Diagnosis not present

## 2016-02-15 DIAGNOSIS — I110 Hypertensive heart disease with heart failure: Secondary | ICD-10-CM | POA: Diagnosis not present

## 2016-02-15 DIAGNOSIS — H409 Unspecified glaucoma: Secondary | ICD-10-CM | POA: Diagnosis not present

## 2016-02-15 DIAGNOSIS — I281 Aneurysm of pulmonary artery: Secondary | ICD-10-CM | POA: Diagnosis not present

## 2016-02-15 DIAGNOSIS — N259 Disorder resulting from impaired renal tubular function, unspecified: Secondary | ICD-10-CM | POA: Diagnosis not present

## 2016-02-15 DIAGNOSIS — I5032 Chronic diastolic (congestive) heart failure: Secondary | ICD-10-CM | POA: Diagnosis not present

## 2016-02-15 DIAGNOSIS — Z9981 Dependence on supplemental oxygen: Secondary | ICD-10-CM | POA: Diagnosis not present

## 2016-02-16 DIAGNOSIS — H542 Low vision, both eyes: Secondary | ICD-10-CM | POA: Diagnosis not present

## 2016-02-16 DIAGNOSIS — M150 Primary generalized (osteo)arthritis: Secondary | ICD-10-CM | POA: Diagnosis not present

## 2016-02-16 DIAGNOSIS — Z9981 Dependence on supplemental oxygen: Secondary | ICD-10-CM | POA: Diagnosis not present

## 2016-02-16 DIAGNOSIS — I481 Persistent atrial fibrillation: Secondary | ICD-10-CM | POA: Diagnosis not present

## 2016-02-16 DIAGNOSIS — H409 Unspecified glaucoma: Secondary | ICD-10-CM | POA: Diagnosis not present

## 2016-02-16 DIAGNOSIS — I281 Aneurysm of pulmonary artery: Secondary | ICD-10-CM | POA: Diagnosis not present

## 2016-02-16 DIAGNOSIS — I359 Nonrheumatic aortic valve disorder, unspecified: Secondary | ICD-10-CM | POA: Diagnosis not present

## 2016-02-16 DIAGNOSIS — N259 Disorder resulting from impaired renal tubular function, unspecified: Secondary | ICD-10-CM | POA: Diagnosis not present

## 2016-02-16 DIAGNOSIS — I5032 Chronic diastolic (congestive) heart failure: Secondary | ICD-10-CM | POA: Diagnosis not present

## 2016-02-16 DIAGNOSIS — I110 Hypertensive heart disease with heart failure: Secondary | ICD-10-CM | POA: Diagnosis not present

## 2016-02-16 DIAGNOSIS — Z7901 Long term (current) use of anticoagulants: Secondary | ICD-10-CM | POA: Diagnosis not present

## 2016-02-16 DIAGNOSIS — M109 Gout, unspecified: Secondary | ICD-10-CM | POA: Diagnosis not present

## 2016-02-16 DIAGNOSIS — I272 Other secondary pulmonary hypertension: Secondary | ICD-10-CM | POA: Diagnosis not present

## 2016-02-17 ENCOUNTER — Encounter: Payer: Self-pay | Admitting: Family

## 2016-02-17 ENCOUNTER — Ambulatory Visit (INDEPENDENT_AMBULATORY_CARE_PROVIDER_SITE_OTHER): Payer: Medicare Other | Admitting: Family

## 2016-02-17 VITALS — BP 138/86 | HR 107 | Temp 97.7°F | Wt 187.0 lb

## 2016-02-17 DIAGNOSIS — M7989 Other specified soft tissue disorders: Secondary | ICD-10-CM

## 2016-02-17 NOTE — Progress Notes (Signed)
Pre visit review using our clinic review tool, if applicable. No additional management support is needed unless otherwise documented below in the visit note. 

## 2016-02-17 NOTE — Patient Instructions (Signed)
You look great. Keep up the good work.

## 2016-02-17 NOTE — Progress Notes (Signed)
Subjective:    Patient ID: [name] [name], male    DOB: [birth year], 82 y.o.   MRN: [id number]   [name] [name] is a 82 y.o. male who presents today for an acute visit.    HPI Comments: Patient here for follow-up for recheck of cellulitis. Bilateral lower extremity edema has largely resolved. Swelling improves with ACE wrap and elevation. No increased warmth, skin intact. Daughter has been doing daily dressing changes and using ACE wrap. No Cough, shortness of breath, chest pain.   Last saw patient 6/29 and started patient on Cycrin. Wound culture did not grow MRSA. INR within normal limits.  Past Medical History  Diagnosis Date  . Unspecified essential hypertension   . Congestive heart failure, unspecified   . Atrial fibrillation ([place])   . 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 Pure hypercholesterolemia   . Unspecified hemorrhoids without mention of complication   . Unspecified cerebral artery occlusion with cerebral infarction   . Generalized osteoarthrosis, unspecified site   . Gout, unspecified   . Pulmonary hypertension ([place])   . Aortic stenosis   . BPH (benign prostatic hypertrophy)   . CVA (cerebral infarction)   . DJD (degenerative joint disease)   . Renal insufficiency    Allergies: Ibuprofen Current Outpatient Prescriptions on File Prior to Visit  Medication Sig Dispense Refill  . allopurinol (ZYLOPRIM) 100 MG tablet Take 1 tablet (100 mg total) by mouth daily. Overdue for yearly physical w/labs must see MD for refills 30 tablet 0  . atorvastatin (LIPITOR) 20 MG tablet TAKE 1 TABLET BY MOUTH DAILY 30 tablet 8  . bimatoprost (LUMIGAN) 0.03 % ophthalmic solution Place 1 drop into both eyes at bedtime.      . brinzolamide (AZOPT) 1 % ophthalmic suspension Place 1 drop into both eyes 2 (two) times daily.      . Cholecalciferol (VITAMIN D) 2000 UNITS CAPS Take 1 capsule by mouth daily.      . cloNIDine (CATAPRES) 0.2 MG tablet TAKE 1 TABLET(0.2 MG) BY MOUTH TWICE  DAILY 180 tablet 0  . doxycycline (VIBRA-TABS) 100 MG tablet Take 1 tablet (100 mg total) by mouth 2 (two) times daily. (Patient not taking: Reported on 02/17/2016) 20 tablet 0  . eplerenone (INSPRA) 25 MG tablet TAKE 1 TABLET BY MOUTH EVERY DAY 90 tablet 0  . LUMIGAN 0.01 % SOLN INT 1 GTT IN OU Q NIGHT  11  . meclizine (ANTIVERT) 25 MG tablet Take 1 tablet (25 mg total) by mouth 3 (three) times daily as needed. 50 tablet 5  . metoprolol succinate (TOPROL-XL) 100 MG 24 hr tablet Take 1 tablet (100 mg total) by mouth daily. Take with or immediately following a meal. 30 tablet 5  . mupirocin ointment (BACTROBAN) 2 % Place 1 application into the nose 2 (two) times daily. (Patient not taking: Reported on 02/17/2016) 22 g 0  . pilocarpine (PILOCAR) 4 % ophthalmic solution 1 drop into left eye every 4 hrs as needed    . potassium chloride SA (K-DUR,KLOR-CON) 20 MEQ tablet TAKE 3 TABLETS BY MOUTH DAILY 270 tablet 1  . Tamsulosin HCl (FLOMAX) 0.4 MG CAPS Take 0.4 mg by mouth daily.      Marland Kitchen telmisartan (MICARDIS) 80 MG tablet TAKE 1 TABLET BY MOUTH DAILY 90 tablet 0  . torsemide (DEMADEX) 20 MG tablet TAKE 2 TABLET BY MOUTH EVERY MORNING AND 1 TABLET IN THE EVENING 90 tablet 0  . verapamil (CALAN-SR) 120 MG CR tablet TAKE  1 TABLET BY MOUTH EVERY NIGHT AT BEDTIME 30 tablet 8  . warfarin (COUMADIN) 5 MG tablet Take 1 tablet by mouth daily or as directed by coumadin clinic 90 tablet 1   No current facility-administered medications on file prior to visit.    Social History  Substance Use Topics  . Smoking status: Former Smoker -- 3 years    Types: Cigarettes, Cigars    Quit date: 12/05/2003  . Smokeless tobacco: Never Used     Comment: does not smoke cigars anymore  . Alcohol Use: None     Comment: rarely    Review of Systems  Constitutional: Negative for fever and chills.  Respiratory: Negative for cough and shortness of breath.   Cardiovascular: Positive for leg swelling. Negative for chest pain  and palpitations.  Gastrointestinal: Negative for nausea and vomiting.  Skin: Negative for rash and wound.      Objective:    BP 138/86 mmHg  Pulse 107  Temp(Src) 97.7 F (36.5 C)  Wt 187 lb (84.823 kg)  SpO2 88%   Physical Exam  Constitutional: He appears well-developed and well-nourished.  Cardiovascular: Regular rhythm and normal heart sounds.   +1 nonpitting bilateral ankle and pedal edema. No palpable cords or masses. No erythema or increased warmth. Negative Homan sign bilaterally.  LE hair growth symmetric and present. Discoloration of  BLE noted. No varicosities noted. LE warm and palpable pedal pulses.   Pulmonary/Chest: Effort normal and breath sounds normal. No respiratory distress. He has no wheezes. He has no rhonchi. He has no rales.  Lymphadenopathy:       Head (left side): No submandibular and no preauricular adenopathy present.  Neurological: He is alert.  Skin: Skin is warm and dry.  Psychiatric: He has a normal mood and affect. His speech is normal and behavior is normal.  Vitals reviewed.      Assessment & Plan:  1. Leg swelling No signs of infection. Happy to see largely resolved. Suspect venous stasis contributing to BLE due to discoloration  (or from prior use of Clorox - see last note). CMA wrapped leg with ACE wrap.  Paperwork for disability ( Dr Sharlet Salina) and trash collected signed and returned to patient.     I am having [name] [name] maintain his tamsulosin, bimatoprost, Vitamin D, brinzolamide, pilocarpine, meclizine, metoprolol succinate, warfarin, atorvastatin, verapamil, cloNIDine, potassium chloride SA, telmisartan, eplerenone, allopurinol, LUMIGAN, doxycycline, mupirocin ointment, and torsemide.   No orders of the defined types were placed in this encounter.    Return precautions given.   Risks, benefits, and alternatives of the medications and treatment plan prescribed today were discussed, and patient expressed understanding.    Education regarding symptom management and diagnosis given to patient on AVS.  Continue to follow with [name] Po, FNP for routine health maintenance.   [name] [name] and I agreed with plan.   [name] Paris, FNP

## 2016-02-18 DIAGNOSIS — H409 Unspecified glaucoma: Secondary | ICD-10-CM | POA: Diagnosis not present

## 2016-02-18 DIAGNOSIS — I272 Other secondary pulmonary hypertension: Secondary | ICD-10-CM | POA: Diagnosis not present

## 2016-02-18 DIAGNOSIS — I110 Hypertensive heart disease with heart failure: Secondary | ICD-10-CM | POA: Diagnosis not present

## 2016-02-18 DIAGNOSIS — Z9981 Dependence on supplemental oxygen: Secondary | ICD-10-CM | POA: Diagnosis not present

## 2016-02-18 DIAGNOSIS — I359 Nonrheumatic aortic valve disorder, unspecified: Secondary | ICD-10-CM | POA: Diagnosis not present

## 2016-02-18 DIAGNOSIS — I281 Aneurysm of pulmonary artery: Secondary | ICD-10-CM | POA: Diagnosis not present

## 2016-02-18 DIAGNOSIS — M150 Primary generalized (osteo)arthritis: Secondary | ICD-10-CM | POA: Diagnosis not present

## 2016-02-18 DIAGNOSIS — M109 Gout, unspecified: Secondary | ICD-10-CM | POA: Diagnosis not present

## 2016-02-18 DIAGNOSIS — I481 Persistent atrial fibrillation: Secondary | ICD-10-CM | POA: Diagnosis not present

## 2016-02-18 DIAGNOSIS — N259 Disorder resulting from impaired renal tubular function, unspecified: Secondary | ICD-10-CM | POA: Diagnosis not present

## 2016-02-18 DIAGNOSIS — H542 Low vision, both eyes: Secondary | ICD-10-CM | POA: Diagnosis not present

## 2016-02-18 DIAGNOSIS — Z7901 Long term (current) use of anticoagulants: Secondary | ICD-10-CM | POA: Diagnosis not present

## 2016-02-18 DIAGNOSIS — I5032 Chronic diastolic (congestive) heart failure: Secondary | ICD-10-CM | POA: Diagnosis not present

## 2016-02-19 ENCOUNTER — Other Ambulatory Visit: Payer: Self-pay | Admitting: Family

## 2016-02-21 ENCOUNTER — Other Ambulatory Visit: Payer: Self-pay | Admitting: Family

## 2016-02-21 DIAGNOSIS — H542 Low vision, both eyes: Secondary | ICD-10-CM | POA: Diagnosis not present

## 2016-02-21 DIAGNOSIS — Z9981 Dependence on supplemental oxygen: Secondary | ICD-10-CM | POA: Diagnosis not present

## 2016-02-21 DIAGNOSIS — I359 Nonrheumatic aortic valve disorder, unspecified: Secondary | ICD-10-CM | POA: Diagnosis not present

## 2016-02-21 DIAGNOSIS — I281 Aneurysm of pulmonary artery: Secondary | ICD-10-CM | POA: Diagnosis not present

## 2016-02-21 DIAGNOSIS — M150 Primary generalized (osteo)arthritis: Secondary | ICD-10-CM | POA: Diagnosis not present

## 2016-02-21 DIAGNOSIS — M109 Gout, unspecified: Secondary | ICD-10-CM | POA: Diagnosis not present

## 2016-02-21 DIAGNOSIS — N259 Disorder resulting from impaired renal tubular function, unspecified: Secondary | ICD-10-CM | POA: Diagnosis not present

## 2016-02-21 DIAGNOSIS — Z7901 Long term (current) use of anticoagulants: Secondary | ICD-10-CM | POA: Diagnosis not present

## 2016-02-21 DIAGNOSIS — I272 Other secondary pulmonary hypertension: Secondary | ICD-10-CM | POA: Diagnosis not present

## 2016-02-21 DIAGNOSIS — I481 Persistent atrial fibrillation: Secondary | ICD-10-CM | POA: Diagnosis not present

## 2016-02-21 DIAGNOSIS — H409 Unspecified glaucoma: Secondary | ICD-10-CM | POA: Diagnosis not present

## 2016-02-21 DIAGNOSIS — I5032 Chronic diastolic (congestive) heart failure: Secondary | ICD-10-CM | POA: Diagnosis not present

## 2016-02-21 DIAGNOSIS — I110 Hypertensive heart disease with heart failure: Secondary | ICD-10-CM | POA: Diagnosis not present

## 2016-02-22 DIAGNOSIS — I281 Aneurysm of pulmonary artery: Secondary | ICD-10-CM | POA: Diagnosis not present

## 2016-02-22 DIAGNOSIS — M150 Primary generalized (osteo)arthritis: Secondary | ICD-10-CM | POA: Diagnosis not present

## 2016-02-22 DIAGNOSIS — Z9981 Dependence on supplemental oxygen: Secondary | ICD-10-CM | POA: Diagnosis not present

## 2016-02-22 DIAGNOSIS — N259 Disorder resulting from impaired renal tubular function, unspecified: Secondary | ICD-10-CM | POA: Diagnosis not present

## 2016-02-22 DIAGNOSIS — H409 Unspecified glaucoma: Secondary | ICD-10-CM | POA: Diagnosis not present

## 2016-02-22 DIAGNOSIS — I272 Other secondary pulmonary hypertension: Secondary | ICD-10-CM | POA: Diagnosis not present

## 2016-02-22 DIAGNOSIS — I110 Hypertensive heart disease with heart failure: Secondary | ICD-10-CM | POA: Diagnosis not present

## 2016-02-22 DIAGNOSIS — H542 Low vision, both eyes: Secondary | ICD-10-CM | POA: Diagnosis not present

## 2016-02-22 DIAGNOSIS — M109 Gout, unspecified: Secondary | ICD-10-CM | POA: Diagnosis not present

## 2016-02-22 DIAGNOSIS — Z7901 Long term (current) use of anticoagulants: Secondary | ICD-10-CM | POA: Diagnosis not present

## 2016-02-22 DIAGNOSIS — I359 Nonrheumatic aortic valve disorder, unspecified: Secondary | ICD-10-CM | POA: Diagnosis not present

## 2016-02-22 DIAGNOSIS — I481 Persistent atrial fibrillation: Secondary | ICD-10-CM | POA: Diagnosis not present

## 2016-02-22 DIAGNOSIS — I5032 Chronic diastolic (congestive) heart failure: Secondary | ICD-10-CM | POA: Diagnosis not present

## 2016-02-23 DIAGNOSIS — M109 Gout, unspecified: Secondary | ICD-10-CM | POA: Diagnosis not present

## 2016-02-23 DIAGNOSIS — I272 Other secondary pulmonary hypertension: Secondary | ICD-10-CM | POA: Diagnosis not present

## 2016-02-23 DIAGNOSIS — I110 Hypertensive heart disease with heart failure: Secondary | ICD-10-CM | POA: Diagnosis not present

## 2016-02-23 DIAGNOSIS — Z9981 Dependence on supplemental oxygen: Secondary | ICD-10-CM | POA: Diagnosis not present

## 2016-02-23 DIAGNOSIS — N259 Disorder resulting from impaired renal tubular function, unspecified: Secondary | ICD-10-CM | POA: Diagnosis not present

## 2016-02-23 DIAGNOSIS — I281 Aneurysm of pulmonary artery: Secondary | ICD-10-CM | POA: Diagnosis not present

## 2016-02-23 DIAGNOSIS — I359 Nonrheumatic aortic valve disorder, unspecified: Secondary | ICD-10-CM | POA: Diagnosis not present

## 2016-02-23 DIAGNOSIS — Z7901 Long term (current) use of anticoagulants: Secondary | ICD-10-CM | POA: Diagnosis not present

## 2016-02-23 DIAGNOSIS — I5032 Chronic diastolic (congestive) heart failure: Secondary | ICD-10-CM | POA: Diagnosis not present

## 2016-02-23 DIAGNOSIS — H542 Low vision, both eyes: Secondary | ICD-10-CM | POA: Diagnosis not present

## 2016-02-23 DIAGNOSIS — I481 Persistent atrial fibrillation: Secondary | ICD-10-CM | POA: Diagnosis not present

## 2016-02-23 DIAGNOSIS — M150 Primary generalized (osteo)arthritis: Secondary | ICD-10-CM | POA: Diagnosis not present

## 2016-02-23 DIAGNOSIS — H409 Unspecified glaucoma: Secondary | ICD-10-CM | POA: Diagnosis not present

## 2016-02-24 ENCOUNTER — Encounter (HOSPITAL_COMMUNITY): Payer: Medicare Other

## 2016-02-25 DIAGNOSIS — I5032 Chronic diastolic (congestive) heart failure: Secondary | ICD-10-CM | POA: Diagnosis not present

## 2016-02-25 DIAGNOSIS — I110 Hypertensive heart disease with heart failure: Secondary | ICD-10-CM | POA: Diagnosis not present

## 2016-02-25 DIAGNOSIS — I272 Other secondary pulmonary hypertension: Secondary | ICD-10-CM | POA: Diagnosis not present

## 2016-02-25 DIAGNOSIS — M150 Primary generalized (osteo)arthritis: Secondary | ICD-10-CM | POA: Diagnosis not present

## 2016-02-25 DIAGNOSIS — I481 Persistent atrial fibrillation: Secondary | ICD-10-CM | POA: Diagnosis not present

## 2016-02-25 DIAGNOSIS — I359 Nonrheumatic aortic valve disorder, unspecified: Secondary | ICD-10-CM | POA: Diagnosis not present

## 2016-02-25 DIAGNOSIS — N259 Disorder resulting from impaired renal tubular function, unspecified: Secondary | ICD-10-CM | POA: Diagnosis not present

## 2016-02-25 DIAGNOSIS — H409 Unspecified glaucoma: Secondary | ICD-10-CM | POA: Diagnosis not present

## 2016-02-25 DIAGNOSIS — I281 Aneurysm of pulmonary artery: Secondary | ICD-10-CM | POA: Diagnosis not present

## 2016-02-25 DIAGNOSIS — Z9981 Dependence on supplemental oxygen: Secondary | ICD-10-CM | POA: Diagnosis not present

## 2016-02-25 DIAGNOSIS — H542 Low vision, both eyes: Secondary | ICD-10-CM | POA: Diagnosis not present

## 2016-02-25 DIAGNOSIS — Z7901 Long term (current) use of anticoagulants: Secondary | ICD-10-CM | POA: Diagnosis not present

## 2016-02-25 DIAGNOSIS — M109 Gout, unspecified: Secondary | ICD-10-CM | POA: Diagnosis not present

## 2016-02-28 DIAGNOSIS — I110 Hypertensive heart disease with heart failure: Secondary | ICD-10-CM | POA: Diagnosis not present

## 2016-02-28 DIAGNOSIS — H542 Low vision, both eyes: Secondary | ICD-10-CM | POA: Diagnosis not present

## 2016-02-28 DIAGNOSIS — Z9981 Dependence on supplemental oxygen: Secondary | ICD-10-CM | POA: Diagnosis not present

## 2016-02-28 DIAGNOSIS — I359 Nonrheumatic aortic valve disorder, unspecified: Secondary | ICD-10-CM | POA: Diagnosis not present

## 2016-02-28 DIAGNOSIS — H409 Unspecified glaucoma: Secondary | ICD-10-CM | POA: Diagnosis not present

## 2016-02-28 DIAGNOSIS — M109 Gout, unspecified: Secondary | ICD-10-CM | POA: Diagnosis not present

## 2016-02-28 DIAGNOSIS — I272 Other secondary pulmonary hypertension: Secondary | ICD-10-CM | POA: Diagnosis not present

## 2016-02-28 DIAGNOSIS — I5032 Chronic diastolic (congestive) heart failure: Secondary | ICD-10-CM | POA: Diagnosis not present

## 2016-02-28 DIAGNOSIS — I281 Aneurysm of pulmonary artery: Secondary | ICD-10-CM | POA: Diagnosis not present

## 2016-02-28 DIAGNOSIS — N259 Disorder resulting from impaired renal tubular function, unspecified: Secondary | ICD-10-CM | POA: Diagnosis not present

## 2016-02-28 DIAGNOSIS — Z7901 Long term (current) use of anticoagulants: Secondary | ICD-10-CM | POA: Diagnosis not present

## 2016-02-28 DIAGNOSIS — I481 Persistent atrial fibrillation: Secondary | ICD-10-CM | POA: Diagnosis not present

## 2016-02-28 DIAGNOSIS — M150 Primary generalized (osteo)arthritis: Secondary | ICD-10-CM | POA: Diagnosis not present

## 2016-02-29 DIAGNOSIS — I481 Persistent atrial fibrillation: Secondary | ICD-10-CM | POA: Diagnosis not present

## 2016-02-29 DIAGNOSIS — H409 Unspecified glaucoma: Secondary | ICD-10-CM | POA: Diagnosis not present

## 2016-02-29 DIAGNOSIS — M109 Gout, unspecified: Secondary | ICD-10-CM | POA: Diagnosis not present

## 2016-02-29 DIAGNOSIS — Z7901 Long term (current) use of anticoagulants: Secondary | ICD-10-CM | POA: Diagnosis not present

## 2016-02-29 DIAGNOSIS — I281 Aneurysm of pulmonary artery: Secondary | ICD-10-CM | POA: Diagnosis not present

## 2016-02-29 DIAGNOSIS — M150 Primary generalized (osteo)arthritis: Secondary | ICD-10-CM | POA: Diagnosis not present

## 2016-02-29 DIAGNOSIS — H542 Low vision, both eyes: Secondary | ICD-10-CM | POA: Diagnosis not present

## 2016-02-29 DIAGNOSIS — Z9981 Dependence on supplemental oxygen: Secondary | ICD-10-CM | POA: Diagnosis not present

## 2016-02-29 DIAGNOSIS — I359 Nonrheumatic aortic valve disorder, unspecified: Secondary | ICD-10-CM | POA: Diagnosis not present

## 2016-02-29 DIAGNOSIS — I5032 Chronic diastolic (congestive) heart failure: Secondary | ICD-10-CM | POA: Diagnosis not present

## 2016-02-29 DIAGNOSIS — I110 Hypertensive heart disease with heart failure: Secondary | ICD-10-CM | POA: Diagnosis not present

## 2016-02-29 DIAGNOSIS — N259 Disorder resulting from impaired renal tubular function, unspecified: Secondary | ICD-10-CM | POA: Diagnosis not present

## 2016-02-29 DIAGNOSIS — I272 Other secondary pulmonary hypertension: Secondary | ICD-10-CM | POA: Diagnosis not present

## 2016-03-01 DIAGNOSIS — M109 Gout, unspecified: Secondary | ICD-10-CM | POA: Diagnosis not present

## 2016-03-01 DIAGNOSIS — I481 Persistent atrial fibrillation: Secondary | ICD-10-CM | POA: Diagnosis not present

## 2016-03-01 DIAGNOSIS — H409 Unspecified glaucoma: Secondary | ICD-10-CM | POA: Diagnosis not present

## 2016-03-01 DIAGNOSIS — I5032 Chronic diastolic (congestive) heart failure: Secondary | ICD-10-CM | POA: Diagnosis not present

## 2016-03-01 DIAGNOSIS — Z9981 Dependence on supplemental oxygen: Secondary | ICD-10-CM | POA: Diagnosis not present

## 2016-03-01 DIAGNOSIS — M150 Primary generalized (osteo)arthritis: Secondary | ICD-10-CM | POA: Diagnosis not present

## 2016-03-01 DIAGNOSIS — H542 Low vision, both eyes: Secondary | ICD-10-CM | POA: Diagnosis not present

## 2016-03-01 DIAGNOSIS — Z7901 Long term (current) use of anticoagulants: Secondary | ICD-10-CM | POA: Diagnosis not present

## 2016-03-01 DIAGNOSIS — I110 Hypertensive heart disease with heart failure: Secondary | ICD-10-CM | POA: Diagnosis not present

## 2016-03-01 DIAGNOSIS — I359 Nonrheumatic aortic valve disorder, unspecified: Secondary | ICD-10-CM | POA: Diagnosis not present

## 2016-03-01 DIAGNOSIS — I272 Other secondary pulmonary hypertension: Secondary | ICD-10-CM | POA: Diagnosis not present

## 2016-03-01 DIAGNOSIS — N259 Disorder resulting from impaired renal tubular function, unspecified: Secondary | ICD-10-CM | POA: Diagnosis not present

## 2016-03-01 DIAGNOSIS — I281 Aneurysm of pulmonary artery: Secondary | ICD-10-CM | POA: Diagnosis not present

## 2016-03-03 DIAGNOSIS — H542 Low vision, both eyes: Secondary | ICD-10-CM | POA: Diagnosis not present

## 2016-03-03 DIAGNOSIS — I110 Hypertensive heart disease with heart failure: Secondary | ICD-10-CM | POA: Diagnosis not present

## 2016-03-03 DIAGNOSIS — M109 Gout, unspecified: Secondary | ICD-10-CM | POA: Diagnosis not present

## 2016-03-03 DIAGNOSIS — I5032 Chronic diastolic (congestive) heart failure: Secondary | ICD-10-CM | POA: Diagnosis not present

## 2016-03-03 DIAGNOSIS — Z9981 Dependence on supplemental oxygen: Secondary | ICD-10-CM | POA: Diagnosis not present

## 2016-03-03 DIAGNOSIS — N259 Disorder resulting from impaired renal tubular function, unspecified: Secondary | ICD-10-CM | POA: Diagnosis not present

## 2016-03-03 DIAGNOSIS — I481 Persistent atrial fibrillation: Secondary | ICD-10-CM | POA: Diagnosis not present

## 2016-03-03 DIAGNOSIS — I272 Other secondary pulmonary hypertension: Secondary | ICD-10-CM | POA: Diagnosis not present

## 2016-03-03 DIAGNOSIS — I281 Aneurysm of pulmonary artery: Secondary | ICD-10-CM | POA: Diagnosis not present

## 2016-03-03 DIAGNOSIS — H409 Unspecified glaucoma: Secondary | ICD-10-CM | POA: Diagnosis not present

## 2016-03-03 DIAGNOSIS — I359 Nonrheumatic aortic valve disorder, unspecified: Secondary | ICD-10-CM | POA: Diagnosis not present

## 2016-03-03 DIAGNOSIS — Z7901 Long term (current) use of anticoagulants: Secondary | ICD-10-CM | POA: Diagnosis not present

## 2016-03-03 DIAGNOSIS — M150 Primary generalized (osteo)arthritis: Secondary | ICD-10-CM | POA: Diagnosis not present

## 2016-03-06 DIAGNOSIS — I5032 Chronic diastolic (congestive) heart failure: Secondary | ICD-10-CM | POA: Diagnosis not present

## 2016-03-06 DIAGNOSIS — Z9981 Dependence on supplemental oxygen: Secondary | ICD-10-CM | POA: Diagnosis not present

## 2016-03-06 DIAGNOSIS — I281 Aneurysm of pulmonary artery: Secondary | ICD-10-CM | POA: Diagnosis not present

## 2016-03-06 DIAGNOSIS — H542 Low vision, both eyes: Secondary | ICD-10-CM | POA: Diagnosis not present

## 2016-03-06 DIAGNOSIS — N259 Disorder resulting from impaired renal tubular function, unspecified: Secondary | ICD-10-CM | POA: Diagnosis not present

## 2016-03-06 DIAGNOSIS — M150 Primary generalized (osteo)arthritis: Secondary | ICD-10-CM | POA: Diagnosis not present

## 2016-03-06 DIAGNOSIS — H409 Unspecified glaucoma: Secondary | ICD-10-CM | POA: Diagnosis not present

## 2016-03-06 DIAGNOSIS — I359 Nonrheumatic aortic valve disorder, unspecified: Secondary | ICD-10-CM | POA: Diagnosis not present

## 2016-03-06 DIAGNOSIS — Z7901 Long term (current) use of anticoagulants: Secondary | ICD-10-CM | POA: Diagnosis not present

## 2016-03-06 DIAGNOSIS — I272 Other secondary pulmonary hypertension: Secondary | ICD-10-CM | POA: Diagnosis not present

## 2016-03-06 DIAGNOSIS — M109 Gout, unspecified: Secondary | ICD-10-CM | POA: Diagnosis not present

## 2016-03-06 DIAGNOSIS — I110 Hypertensive heart disease with heart failure: Secondary | ICD-10-CM | POA: Diagnosis not present

## 2016-03-06 DIAGNOSIS — I481 Persistent atrial fibrillation: Secondary | ICD-10-CM | POA: Diagnosis not present

## 2016-03-07 ENCOUNTER — Encounter: Payer: Self-pay | Admitting: Podiatry

## 2016-03-07 ENCOUNTER — Ambulatory Visit (INDEPENDENT_AMBULATORY_CARE_PROVIDER_SITE_OTHER): Payer: Medicare Other | Admitting: *Deleted

## 2016-03-07 ENCOUNTER — Ambulatory Visit (INDEPENDENT_AMBULATORY_CARE_PROVIDER_SITE_OTHER): Payer: Medicare Other | Admitting: Podiatry

## 2016-03-07 DIAGNOSIS — I4891 Unspecified atrial fibrillation: Secondary | ICD-10-CM

## 2016-03-07 DIAGNOSIS — I481 Persistent atrial fibrillation: Secondary | ICD-10-CM | POA: Diagnosis not present

## 2016-03-07 DIAGNOSIS — M150 Primary generalized (osteo)arthritis: Secondary | ICD-10-CM | POA: Diagnosis not present

## 2016-03-07 DIAGNOSIS — M79606 Pain in leg, unspecified: Secondary | ICD-10-CM

## 2016-03-07 DIAGNOSIS — B351 Tinea unguium: Secondary | ICD-10-CM

## 2016-03-07 DIAGNOSIS — H542 Low vision, both eyes: Secondary | ICD-10-CM | POA: Diagnosis not present

## 2016-03-07 DIAGNOSIS — Z7901 Long term (current) use of anticoagulants: Secondary | ICD-10-CM

## 2016-03-07 DIAGNOSIS — I281 Aneurysm of pulmonary artery: Secondary | ICD-10-CM | POA: Diagnosis not present

## 2016-03-07 DIAGNOSIS — I6350 Cerebral infarction due to unspecified occlusion or stenosis of unspecified cerebral artery: Secondary | ICD-10-CM

## 2016-03-07 DIAGNOSIS — N259 Disorder resulting from impaired renal tubular function, unspecified: Secondary | ICD-10-CM | POA: Diagnosis not present

## 2016-03-07 DIAGNOSIS — I5032 Chronic diastolic (congestive) heart failure: Secondary | ICD-10-CM | POA: Diagnosis not present

## 2016-03-07 DIAGNOSIS — H409 Unspecified glaucoma: Secondary | ICD-10-CM | POA: Diagnosis not present

## 2016-03-07 DIAGNOSIS — I110 Hypertensive heart disease with heart failure: Secondary | ICD-10-CM | POA: Diagnosis not present

## 2016-03-07 DIAGNOSIS — M109 Gout, unspecified: Secondary | ICD-10-CM | POA: Diagnosis not present

## 2016-03-07 DIAGNOSIS — I359 Nonrheumatic aortic valve disorder, unspecified: Secondary | ICD-10-CM | POA: Diagnosis not present

## 2016-03-07 DIAGNOSIS — Z9981 Dependence on supplemental oxygen: Secondary | ICD-10-CM | POA: Diagnosis not present

## 2016-03-07 DIAGNOSIS — I272 Other secondary pulmonary hypertension: Secondary | ICD-10-CM | POA: Diagnosis not present

## 2016-03-07 LAB — POCT INR: INR: 4

## 2016-03-07 NOTE — Progress Notes (Signed)
Patient ID: [name] [name], male   DOB: September 02, 1925, 82 y.o.   MRN: [id number]  Complaint:  Visit Type: Patient returns to my office for continued preventative foot care services. Complaint: Patient states" my nails have grown long and thick and become painful to walk and wear shoes"  The patient presents for preventative foot care services. No changes to ROS  Podiatric Exam: Vascular: dorsalis pedis and posterior tibial pulses are palpable bilateral. Capillary return is immediate. Temperature gradient is WNL. Skin turgor WNL  Sensorium: Normal Semmes Weinstein monofilament test. Normal tactile sensation bilaterally. Nail Exam: Pt has thick disfigured discolored nails with subungual debris noted bilateral entire nail hallux through fifth toenails Ulcer Exam: There is no evidence of ulcer or pre-ulcerative changes or infection. Orthopedic Exam: Muscle tone and strength are WNL. No limitations in general ROM. No crepitus or effusions noted. Foot type and digits show no abnormalities. Bony prominences are unremarkable. Skin: No Porokeratosis. No infection or ulcers  Diagnosis:  Onychomycosis, , Pain in right toe, pain in left toes  Treatment & Plan Procedures and Treatment: Consent by patient was obtained for treatment procedures. The patient understood the discussion of treatment and procedures well. All questions were answered thoroughly reviewed. Debridement of mycotic and hypertrophic toenails, 1 through 5 bilateral and clearing of subungual debris. No ulceration, no infection noted. Patient developed cellulitis foot due to bleach usage.  Cellulitis has resolved. Return Visit-Office Procedure: Patient instructed to return to the office for a follow up visit 3 months for continued evaluation and treatment.   Gardiner Barefoot DPM

## 2016-03-08 DIAGNOSIS — H409 Unspecified glaucoma: Secondary | ICD-10-CM | POA: Diagnosis not present

## 2016-03-08 DIAGNOSIS — I5032 Chronic diastolic (congestive) heart failure: Secondary | ICD-10-CM | POA: Diagnosis not present

## 2016-03-08 DIAGNOSIS — I281 Aneurysm of pulmonary artery: Secondary | ICD-10-CM | POA: Diagnosis not present

## 2016-03-08 DIAGNOSIS — I272 Other secondary pulmonary hypertension: Secondary | ICD-10-CM | POA: Diagnosis not present

## 2016-03-08 DIAGNOSIS — I110 Hypertensive heart disease with heart failure: Secondary | ICD-10-CM | POA: Diagnosis not present

## 2016-03-08 DIAGNOSIS — I359 Nonrheumatic aortic valve disorder, unspecified: Secondary | ICD-10-CM | POA: Diagnosis not present

## 2016-03-08 DIAGNOSIS — Z9981 Dependence on supplemental oxygen: Secondary | ICD-10-CM | POA: Diagnosis not present

## 2016-03-08 DIAGNOSIS — M150 Primary generalized (osteo)arthritis: Secondary | ICD-10-CM | POA: Diagnosis not present

## 2016-03-08 DIAGNOSIS — M109 Gout, unspecified: Secondary | ICD-10-CM | POA: Diagnosis not present

## 2016-03-08 DIAGNOSIS — Z7901 Long term (current) use of anticoagulants: Secondary | ICD-10-CM | POA: Diagnosis not present

## 2016-03-08 DIAGNOSIS — N259 Disorder resulting from impaired renal tubular function, unspecified: Secondary | ICD-10-CM | POA: Diagnosis not present

## 2016-03-08 DIAGNOSIS — I481 Persistent atrial fibrillation: Secondary | ICD-10-CM | POA: Diagnosis not present

## 2016-03-08 DIAGNOSIS — H542 Low vision, both eyes: Secondary | ICD-10-CM | POA: Diagnosis not present

## 2016-03-09 DIAGNOSIS — I272 Other secondary pulmonary hypertension: Secondary | ICD-10-CM | POA: Diagnosis not present

## 2016-03-09 DIAGNOSIS — I5032 Chronic diastolic (congestive) heart failure: Secondary | ICD-10-CM | POA: Diagnosis not present

## 2016-03-09 DIAGNOSIS — H409 Unspecified glaucoma: Secondary | ICD-10-CM | POA: Diagnosis not present

## 2016-03-09 DIAGNOSIS — N259 Disorder resulting from impaired renal tubular function, unspecified: Secondary | ICD-10-CM | POA: Diagnosis not present

## 2016-03-09 DIAGNOSIS — Z9981 Dependence on supplemental oxygen: Secondary | ICD-10-CM | POA: Diagnosis not present

## 2016-03-09 DIAGNOSIS — Z7901 Long term (current) use of anticoagulants: Secondary | ICD-10-CM | POA: Diagnosis not present

## 2016-03-09 DIAGNOSIS — M109 Gout, unspecified: Secondary | ICD-10-CM | POA: Diagnosis not present

## 2016-03-09 DIAGNOSIS — I481 Persistent atrial fibrillation: Secondary | ICD-10-CM | POA: Diagnosis not present

## 2016-03-09 DIAGNOSIS — M150 Primary generalized (osteo)arthritis: Secondary | ICD-10-CM | POA: Diagnosis not present

## 2016-03-09 DIAGNOSIS — H542 Low vision, both eyes: Secondary | ICD-10-CM | POA: Diagnosis not present

## 2016-03-09 DIAGNOSIS — I359 Nonrheumatic aortic valve disorder, unspecified: Secondary | ICD-10-CM | POA: Diagnosis not present

## 2016-03-09 DIAGNOSIS — I110 Hypertensive heart disease with heart failure: Secondary | ICD-10-CM | POA: Diagnosis not present

## 2016-03-09 DIAGNOSIS — I281 Aneurysm of pulmonary artery: Secondary | ICD-10-CM | POA: Diagnosis not present

## 2016-03-13 DIAGNOSIS — I272 Other secondary pulmonary hypertension: Secondary | ICD-10-CM | POA: Diagnosis not present

## 2016-03-13 DIAGNOSIS — Z9981 Dependence on supplemental oxygen: Secondary | ICD-10-CM | POA: Diagnosis not present

## 2016-03-13 DIAGNOSIS — H409 Unspecified glaucoma: Secondary | ICD-10-CM | POA: Diagnosis not present

## 2016-03-13 DIAGNOSIS — I5032 Chronic diastolic (congestive) heart failure: Secondary | ICD-10-CM | POA: Diagnosis not present

## 2016-03-13 DIAGNOSIS — M109 Gout, unspecified: Secondary | ICD-10-CM | POA: Diagnosis not present

## 2016-03-13 DIAGNOSIS — I110 Hypertensive heart disease with heart failure: Secondary | ICD-10-CM | POA: Diagnosis not present

## 2016-03-13 DIAGNOSIS — I281 Aneurysm of pulmonary artery: Secondary | ICD-10-CM | POA: Diagnosis not present

## 2016-03-13 DIAGNOSIS — Z7901 Long term (current) use of anticoagulants: Secondary | ICD-10-CM | POA: Diagnosis not present

## 2016-03-13 DIAGNOSIS — I481 Persistent atrial fibrillation: Secondary | ICD-10-CM | POA: Diagnosis not present

## 2016-03-13 DIAGNOSIS — N259 Disorder resulting from impaired renal tubular function, unspecified: Secondary | ICD-10-CM | POA: Diagnosis not present

## 2016-03-13 DIAGNOSIS — I359 Nonrheumatic aortic valve disorder, unspecified: Secondary | ICD-10-CM | POA: Diagnosis not present

## 2016-03-13 DIAGNOSIS — M150 Primary generalized (osteo)arthritis: Secondary | ICD-10-CM | POA: Diagnosis not present

## 2016-03-13 DIAGNOSIS — H542 Low vision, both eyes: Secondary | ICD-10-CM | POA: Diagnosis not present

## 2016-03-14 DIAGNOSIS — I110 Hypertensive heart disease with heart failure: Secondary | ICD-10-CM | POA: Diagnosis not present

## 2016-03-14 DIAGNOSIS — I281 Aneurysm of pulmonary artery: Secondary | ICD-10-CM | POA: Diagnosis not present

## 2016-03-14 DIAGNOSIS — I5032 Chronic diastolic (congestive) heart failure: Secondary | ICD-10-CM | POA: Diagnosis not present

## 2016-03-14 DIAGNOSIS — I272 Other secondary pulmonary hypertension: Secondary | ICD-10-CM | POA: Diagnosis not present

## 2016-03-14 DIAGNOSIS — M109 Gout, unspecified: Secondary | ICD-10-CM | POA: Diagnosis not present

## 2016-03-14 DIAGNOSIS — I359 Nonrheumatic aortic valve disorder, unspecified: Secondary | ICD-10-CM | POA: Diagnosis not present

## 2016-03-14 DIAGNOSIS — H409 Unspecified glaucoma: Secondary | ICD-10-CM | POA: Diagnosis not present

## 2016-03-14 DIAGNOSIS — I481 Persistent atrial fibrillation: Secondary | ICD-10-CM | POA: Diagnosis not present

## 2016-03-14 DIAGNOSIS — Z9981 Dependence on supplemental oxygen: Secondary | ICD-10-CM | POA: Diagnosis not present

## 2016-03-14 DIAGNOSIS — Z7901 Long term (current) use of anticoagulants: Secondary | ICD-10-CM | POA: Diagnosis not present

## 2016-03-14 DIAGNOSIS — N259 Disorder resulting from impaired renal tubular function, unspecified: Secondary | ICD-10-CM | POA: Diagnosis not present

## 2016-03-14 DIAGNOSIS — H542 Low vision, both eyes: Secondary | ICD-10-CM | POA: Diagnosis not present

## 2016-03-14 DIAGNOSIS — M150 Primary generalized (osteo)arthritis: Secondary | ICD-10-CM | POA: Diagnosis not present

## 2016-03-15 DIAGNOSIS — I509 Heart failure, unspecified: Secondary | ICD-10-CM | POA: Diagnosis not present

## 2016-03-16 ENCOUNTER — Telehealth: Payer: Self-pay | Admitting: Pulmonary Disease

## 2016-03-16 DIAGNOSIS — I281 Aneurysm of pulmonary artery: Secondary | ICD-10-CM | POA: Diagnosis not present

## 2016-03-16 DIAGNOSIS — M109 Gout, unspecified: Secondary | ICD-10-CM | POA: Diagnosis not present

## 2016-03-16 DIAGNOSIS — I272 Other secondary pulmonary hypertension: Secondary | ICD-10-CM | POA: Diagnosis not present

## 2016-03-16 DIAGNOSIS — Z9981 Dependence on supplemental oxygen: Secondary | ICD-10-CM | POA: Diagnosis not present

## 2016-03-16 DIAGNOSIS — H542 Low vision, both eyes: Secondary | ICD-10-CM | POA: Diagnosis not present

## 2016-03-16 DIAGNOSIS — I359 Nonrheumatic aortic valve disorder, unspecified: Secondary | ICD-10-CM | POA: Diagnosis not present

## 2016-03-16 DIAGNOSIS — H409 Unspecified glaucoma: Secondary | ICD-10-CM | POA: Diagnosis not present

## 2016-03-16 DIAGNOSIS — I5032 Chronic diastolic (congestive) heart failure: Secondary | ICD-10-CM | POA: Diagnosis not present

## 2016-03-16 DIAGNOSIS — Z7901 Long term (current) use of anticoagulants: Secondary | ICD-10-CM | POA: Diagnosis not present

## 2016-03-16 DIAGNOSIS — N259 Disorder resulting from impaired renal tubular function, unspecified: Secondary | ICD-10-CM | POA: Diagnosis not present

## 2016-03-16 DIAGNOSIS — I481 Persistent atrial fibrillation: Secondary | ICD-10-CM | POA: Diagnosis not present

## 2016-03-16 DIAGNOSIS — I110 Hypertensive heart disease with heart failure: Secondary | ICD-10-CM | POA: Diagnosis not present

## 2016-03-16 DIAGNOSIS — M150 Primary generalized (osteo)arthritis: Secondary | ICD-10-CM | POA: Diagnosis not present

## 2016-03-16 NOTE — Telephone Encounter (Signed)
lmtcb x1 for Gracie at [facility].

## 2016-03-17 NOTE — Telephone Encounter (Signed)
Spoke with [name] [name]. Verbal order has been given to her. Nothing further was needed.

## 2016-03-20 ENCOUNTER — Other Ambulatory Visit: Payer: Self-pay | Admitting: Family

## 2016-03-20 DIAGNOSIS — M109 Gout, unspecified: Secondary | ICD-10-CM | POA: Diagnosis not present

## 2016-03-20 DIAGNOSIS — H542 Low vision, both eyes: Secondary | ICD-10-CM | POA: Diagnosis not present

## 2016-03-20 DIAGNOSIS — I481 Persistent atrial fibrillation: Secondary | ICD-10-CM | POA: Diagnosis not present

## 2016-03-20 DIAGNOSIS — H409 Unspecified glaucoma: Secondary | ICD-10-CM | POA: Diagnosis not present

## 2016-03-20 DIAGNOSIS — I359 Nonrheumatic aortic valve disorder, unspecified: Secondary | ICD-10-CM | POA: Diagnosis not present

## 2016-03-20 DIAGNOSIS — N259 Disorder resulting from impaired renal tubular function, unspecified: Secondary | ICD-10-CM | POA: Diagnosis not present

## 2016-03-20 DIAGNOSIS — Z7901 Long term (current) use of anticoagulants: Secondary | ICD-10-CM | POA: Diagnosis not present

## 2016-03-20 DIAGNOSIS — I5032 Chronic diastolic (congestive) heart failure: Secondary | ICD-10-CM | POA: Diagnosis not present

## 2016-03-20 DIAGNOSIS — I272 Other secondary pulmonary hypertension: Secondary | ICD-10-CM | POA: Diagnosis not present

## 2016-03-20 DIAGNOSIS — I110 Hypertensive heart disease with heart failure: Secondary | ICD-10-CM | POA: Diagnosis not present

## 2016-03-20 DIAGNOSIS — M150 Primary generalized (osteo)arthritis: Secondary | ICD-10-CM | POA: Diagnosis not present

## 2016-03-20 DIAGNOSIS — I281 Aneurysm of pulmonary artery: Secondary | ICD-10-CM | POA: Diagnosis not present

## 2016-03-20 DIAGNOSIS — Z9981 Dependence on supplemental oxygen: Secondary | ICD-10-CM | POA: Diagnosis not present

## 2016-03-21 DIAGNOSIS — I359 Nonrheumatic aortic valve disorder, unspecified: Secondary | ICD-10-CM | POA: Diagnosis not present

## 2016-03-21 DIAGNOSIS — I5032 Chronic diastolic (congestive) heart failure: Secondary | ICD-10-CM | POA: Diagnosis not present

## 2016-03-21 DIAGNOSIS — M150 Primary generalized (osteo)arthritis: Secondary | ICD-10-CM | POA: Diagnosis not present

## 2016-03-21 DIAGNOSIS — I272 Other secondary pulmonary hypertension: Secondary | ICD-10-CM | POA: Diagnosis not present

## 2016-03-21 DIAGNOSIS — N259 Disorder resulting from impaired renal tubular function, unspecified: Secondary | ICD-10-CM | POA: Diagnosis not present

## 2016-03-21 DIAGNOSIS — I481 Persistent atrial fibrillation: Secondary | ICD-10-CM | POA: Diagnosis not present

## 2016-03-21 DIAGNOSIS — I281 Aneurysm of pulmonary artery: Secondary | ICD-10-CM | POA: Diagnosis not present

## 2016-03-21 DIAGNOSIS — M109 Gout, unspecified: Secondary | ICD-10-CM | POA: Diagnosis not present

## 2016-03-21 DIAGNOSIS — H409 Unspecified glaucoma: Secondary | ICD-10-CM | POA: Diagnosis not present

## 2016-03-21 DIAGNOSIS — Z9981 Dependence on supplemental oxygen: Secondary | ICD-10-CM | POA: Diagnosis not present

## 2016-03-21 DIAGNOSIS — Z7901 Long term (current) use of anticoagulants: Secondary | ICD-10-CM | POA: Diagnosis not present

## 2016-03-21 DIAGNOSIS — H542 Low vision, both eyes: Secondary | ICD-10-CM | POA: Diagnosis not present

## 2016-03-21 DIAGNOSIS — I110 Hypertensive heart disease with heart failure: Secondary | ICD-10-CM | POA: Diagnosis not present

## 2016-03-23 DIAGNOSIS — M109 Gout, unspecified: Secondary | ICD-10-CM | POA: Diagnosis not present

## 2016-03-23 DIAGNOSIS — N259 Disorder resulting from impaired renal tubular function, unspecified: Secondary | ICD-10-CM | POA: Diagnosis not present

## 2016-03-23 DIAGNOSIS — I281 Aneurysm of pulmonary artery: Secondary | ICD-10-CM | POA: Diagnosis not present

## 2016-03-23 DIAGNOSIS — M150 Primary generalized (osteo)arthritis: Secondary | ICD-10-CM | POA: Diagnosis not present

## 2016-03-23 DIAGNOSIS — H542 Low vision, both eyes: Secondary | ICD-10-CM | POA: Diagnosis not present

## 2016-03-23 DIAGNOSIS — I110 Hypertensive heart disease with heart failure: Secondary | ICD-10-CM | POA: Diagnosis not present

## 2016-03-23 DIAGNOSIS — I359 Nonrheumatic aortic valve disorder, unspecified: Secondary | ICD-10-CM | POA: Diagnosis not present

## 2016-03-23 DIAGNOSIS — Z7901 Long term (current) use of anticoagulants: Secondary | ICD-10-CM | POA: Diagnosis not present

## 2016-03-23 DIAGNOSIS — I481 Persistent atrial fibrillation: Secondary | ICD-10-CM | POA: Diagnosis not present

## 2016-03-23 DIAGNOSIS — H409 Unspecified glaucoma: Secondary | ICD-10-CM | POA: Diagnosis not present

## 2016-03-23 DIAGNOSIS — I272 Other secondary pulmonary hypertension: Secondary | ICD-10-CM | POA: Diagnosis not present

## 2016-03-23 DIAGNOSIS — Z9981 Dependence on supplemental oxygen: Secondary | ICD-10-CM | POA: Diagnosis not present

## 2016-03-23 DIAGNOSIS — I5032 Chronic diastolic (congestive) heart failure: Secondary | ICD-10-CM | POA: Diagnosis not present

## 2016-03-24 DIAGNOSIS — H542 Low vision, both eyes: Secondary | ICD-10-CM | POA: Diagnosis not present

## 2016-03-24 DIAGNOSIS — Z9981 Dependence on supplemental oxygen: Secondary | ICD-10-CM | POA: Diagnosis not present

## 2016-03-24 DIAGNOSIS — N259 Disorder resulting from impaired renal tubular function, unspecified: Secondary | ICD-10-CM | POA: Diagnosis not present

## 2016-03-24 DIAGNOSIS — I481 Persistent atrial fibrillation: Secondary | ICD-10-CM | POA: Diagnosis not present

## 2016-03-24 DIAGNOSIS — I281 Aneurysm of pulmonary artery: Secondary | ICD-10-CM | POA: Diagnosis not present

## 2016-03-24 DIAGNOSIS — I110 Hypertensive heart disease with heart failure: Secondary | ICD-10-CM | POA: Diagnosis not present

## 2016-03-24 DIAGNOSIS — I359 Nonrheumatic aortic valve disorder, unspecified: Secondary | ICD-10-CM | POA: Diagnosis not present

## 2016-03-24 DIAGNOSIS — M109 Gout, unspecified: Secondary | ICD-10-CM | POA: Diagnosis not present

## 2016-03-24 DIAGNOSIS — I5032 Chronic diastolic (congestive) heart failure: Secondary | ICD-10-CM | POA: Diagnosis not present

## 2016-03-24 DIAGNOSIS — Z7901 Long term (current) use of anticoagulants: Secondary | ICD-10-CM | POA: Diagnosis not present

## 2016-03-24 DIAGNOSIS — I272 Other secondary pulmonary hypertension: Secondary | ICD-10-CM | POA: Diagnosis not present

## 2016-03-24 DIAGNOSIS — M150 Primary generalized (osteo)arthritis: Secondary | ICD-10-CM | POA: Diagnosis not present

## 2016-03-24 DIAGNOSIS — H409 Unspecified glaucoma: Secondary | ICD-10-CM | POA: Diagnosis not present

## 2016-03-25 ENCOUNTER — Other Ambulatory Visit: Payer: Self-pay | Admitting: Family

## 2016-03-27 DIAGNOSIS — I5032 Chronic diastolic (congestive) heart failure: Secondary | ICD-10-CM | POA: Diagnosis not present

## 2016-03-27 DIAGNOSIS — Z9981 Dependence on supplemental oxygen: Secondary | ICD-10-CM | POA: Diagnosis not present

## 2016-03-27 DIAGNOSIS — I359 Nonrheumatic aortic valve disorder, unspecified: Secondary | ICD-10-CM | POA: Diagnosis not present

## 2016-03-27 DIAGNOSIS — Z7901 Long term (current) use of anticoagulants: Secondary | ICD-10-CM | POA: Diagnosis not present

## 2016-03-27 DIAGNOSIS — I481 Persistent atrial fibrillation: Secondary | ICD-10-CM | POA: Diagnosis not present

## 2016-03-27 DIAGNOSIS — N259 Disorder resulting from impaired renal tubular function, unspecified: Secondary | ICD-10-CM | POA: Diagnosis not present

## 2016-03-27 DIAGNOSIS — H409 Unspecified glaucoma: Secondary | ICD-10-CM | POA: Diagnosis not present

## 2016-03-27 DIAGNOSIS — H542 Low vision, both eyes: Secondary | ICD-10-CM | POA: Diagnosis not present

## 2016-03-27 DIAGNOSIS — M150 Primary generalized (osteo)arthritis: Secondary | ICD-10-CM | POA: Diagnosis not present

## 2016-03-27 DIAGNOSIS — M109 Gout, unspecified: Secondary | ICD-10-CM | POA: Diagnosis not present

## 2016-03-27 DIAGNOSIS — I272 Other secondary pulmonary hypertension: Secondary | ICD-10-CM | POA: Diagnosis not present

## 2016-03-27 DIAGNOSIS — I110 Hypertensive heart disease with heart failure: Secondary | ICD-10-CM | POA: Diagnosis not present

## 2016-03-27 DIAGNOSIS — I281 Aneurysm of pulmonary artery: Secondary | ICD-10-CM | POA: Diagnosis not present

## 2016-03-28 ENCOUNTER — Ambulatory Visit (INDEPENDENT_AMBULATORY_CARE_PROVIDER_SITE_OTHER): Payer: Medicare Other | Admitting: *Deleted

## 2016-03-28 DIAGNOSIS — I4891 Unspecified atrial fibrillation: Secondary | ICD-10-CM | POA: Diagnosis not present

## 2016-03-28 DIAGNOSIS — M150 Primary generalized (osteo)arthritis: Secondary | ICD-10-CM | POA: Diagnosis not present

## 2016-03-28 DIAGNOSIS — H542 Low vision, both eyes: Secondary | ICD-10-CM | POA: Diagnosis not present

## 2016-03-28 DIAGNOSIS — Z7901 Long term (current) use of anticoagulants: Secondary | ICD-10-CM | POA: Diagnosis not present

## 2016-03-28 DIAGNOSIS — N259 Disorder resulting from impaired renal tubular function, unspecified: Secondary | ICD-10-CM | POA: Diagnosis not present

## 2016-03-28 DIAGNOSIS — Z9981 Dependence on supplemental oxygen: Secondary | ICD-10-CM | POA: Diagnosis not present

## 2016-03-28 DIAGNOSIS — I272 Other secondary pulmonary hypertension: Secondary | ICD-10-CM | POA: Diagnosis not present

## 2016-03-28 DIAGNOSIS — M109 Gout, unspecified: Secondary | ICD-10-CM | POA: Diagnosis not present

## 2016-03-28 DIAGNOSIS — I481 Persistent atrial fibrillation: Secondary | ICD-10-CM | POA: Diagnosis not present

## 2016-03-28 DIAGNOSIS — I6350 Cerebral infarction due to unspecified occlusion or stenosis of unspecified cerebral artery: Secondary | ICD-10-CM

## 2016-03-28 DIAGNOSIS — I281 Aneurysm of pulmonary artery: Secondary | ICD-10-CM | POA: Diagnosis not present

## 2016-03-28 DIAGNOSIS — I359 Nonrheumatic aortic valve disorder, unspecified: Secondary | ICD-10-CM | POA: Diagnosis not present

## 2016-03-28 DIAGNOSIS — I110 Hypertensive heart disease with heart failure: Secondary | ICD-10-CM | POA: Diagnosis not present

## 2016-03-28 DIAGNOSIS — I5032 Chronic diastolic (congestive) heart failure: Secondary | ICD-10-CM | POA: Diagnosis not present

## 2016-03-28 DIAGNOSIS — H409 Unspecified glaucoma: Secondary | ICD-10-CM | POA: Diagnosis not present

## 2016-03-28 LAB — POCT INR: INR: 3.4

## 2016-03-30 DIAGNOSIS — M150 Primary generalized (osteo)arthritis: Secondary | ICD-10-CM | POA: Diagnosis not present

## 2016-03-30 DIAGNOSIS — M109 Gout, unspecified: Secondary | ICD-10-CM | POA: Diagnosis not present

## 2016-03-30 DIAGNOSIS — I5032 Chronic diastolic (congestive) heart failure: Secondary | ICD-10-CM | POA: Diagnosis not present

## 2016-03-30 DIAGNOSIS — I481 Persistent atrial fibrillation: Secondary | ICD-10-CM | POA: Diagnosis not present

## 2016-03-30 DIAGNOSIS — H542 Low vision, both eyes: Secondary | ICD-10-CM | POA: Diagnosis not present

## 2016-03-30 DIAGNOSIS — I281 Aneurysm of pulmonary artery: Secondary | ICD-10-CM | POA: Diagnosis not present

## 2016-03-30 DIAGNOSIS — Z9981 Dependence on supplemental oxygen: Secondary | ICD-10-CM | POA: Diagnosis not present

## 2016-03-30 DIAGNOSIS — Z7901 Long term (current) use of anticoagulants: Secondary | ICD-10-CM | POA: Diagnosis not present

## 2016-03-30 DIAGNOSIS — I110 Hypertensive heart disease with heart failure: Secondary | ICD-10-CM | POA: Diagnosis not present

## 2016-03-30 DIAGNOSIS — H409 Unspecified glaucoma: Secondary | ICD-10-CM | POA: Diagnosis not present

## 2016-03-30 DIAGNOSIS — I272 Other secondary pulmonary hypertension: Secondary | ICD-10-CM | POA: Diagnosis not present

## 2016-03-30 DIAGNOSIS — N259 Disorder resulting from impaired renal tubular function, unspecified: Secondary | ICD-10-CM | POA: Diagnosis not present

## 2016-03-30 DIAGNOSIS — I359 Nonrheumatic aortic valve disorder, unspecified: Secondary | ICD-10-CM | POA: Diagnosis not present

## 2016-03-31 DIAGNOSIS — H409 Unspecified glaucoma: Secondary | ICD-10-CM | POA: Diagnosis not present

## 2016-03-31 DIAGNOSIS — I359 Nonrheumatic aortic valve disorder, unspecified: Secondary | ICD-10-CM | POA: Diagnosis not present

## 2016-03-31 DIAGNOSIS — I5032 Chronic diastolic (congestive) heart failure: Secondary | ICD-10-CM | POA: Diagnosis not present

## 2016-03-31 DIAGNOSIS — I272 Other secondary pulmonary hypertension: Secondary | ICD-10-CM | POA: Diagnosis not present

## 2016-03-31 DIAGNOSIS — Z9981 Dependence on supplemental oxygen: Secondary | ICD-10-CM | POA: Diagnosis not present

## 2016-03-31 DIAGNOSIS — Z7901 Long term (current) use of anticoagulants: Secondary | ICD-10-CM | POA: Diagnosis not present

## 2016-03-31 DIAGNOSIS — I110 Hypertensive heart disease with heart failure: Secondary | ICD-10-CM | POA: Diagnosis not present

## 2016-03-31 DIAGNOSIS — I481 Persistent atrial fibrillation: Secondary | ICD-10-CM | POA: Diagnosis not present

## 2016-03-31 DIAGNOSIS — H542 Low vision, both eyes: Secondary | ICD-10-CM | POA: Diagnosis not present

## 2016-03-31 DIAGNOSIS — I281 Aneurysm of pulmonary artery: Secondary | ICD-10-CM | POA: Diagnosis not present

## 2016-03-31 DIAGNOSIS — M150 Primary generalized (osteo)arthritis: Secondary | ICD-10-CM | POA: Diagnosis not present

## 2016-03-31 DIAGNOSIS — M109 Gout, unspecified: Secondary | ICD-10-CM | POA: Diagnosis not present

## 2016-03-31 DIAGNOSIS — N259 Disorder resulting from impaired renal tubular function, unspecified: Secondary | ICD-10-CM | POA: Diagnosis not present

## 2016-04-03 ENCOUNTER — Telehealth: Payer: Self-pay | Admitting: Pulmonary Disease

## 2016-04-03 NOTE — Telephone Encounter (Signed)
Spoke with Gracie from [facility], requesting an order to continue PT and home health 2X weekly for the next 4 weeks.  SN please advise.  Thanks!

## 2016-04-04 DIAGNOSIS — M6281 Muscle weakness (generalized): Secondary | ICD-10-CM | POA: Diagnosis not present

## 2016-04-04 DIAGNOSIS — I281 Aneurysm of pulmonary artery: Secondary | ICD-10-CM | POA: Diagnosis not present

## 2016-04-04 DIAGNOSIS — I481 Persistent atrial fibrillation: Secondary | ICD-10-CM | POA: Diagnosis not present

## 2016-04-04 DIAGNOSIS — I110 Hypertensive heart disease with heart failure: Secondary | ICD-10-CM | POA: Diagnosis not present

## 2016-04-04 DIAGNOSIS — I359 Nonrheumatic aortic valve disorder, unspecified: Secondary | ICD-10-CM | POA: Diagnosis not present

## 2016-04-04 DIAGNOSIS — M150 Primary generalized (osteo)arthritis: Secondary | ICD-10-CM | POA: Diagnosis not present

## 2016-04-04 DIAGNOSIS — H542 Low vision, both eyes: Secondary | ICD-10-CM | POA: Diagnosis not present

## 2016-04-04 DIAGNOSIS — Z7901 Long term (current) use of anticoagulants: Secondary | ICD-10-CM | POA: Diagnosis not present

## 2016-04-04 DIAGNOSIS — I272 Other secondary pulmonary hypertension: Secondary | ICD-10-CM | POA: Diagnosis not present

## 2016-04-04 DIAGNOSIS — M109 Gout, unspecified: Secondary | ICD-10-CM | POA: Diagnosis not present

## 2016-04-04 DIAGNOSIS — Z9981 Dependence on supplemental oxygen: Secondary | ICD-10-CM | POA: Diagnosis not present

## 2016-04-04 DIAGNOSIS — H409 Unspecified glaucoma: Secondary | ICD-10-CM | POA: Diagnosis not present

## 2016-04-04 DIAGNOSIS — I5032 Chronic diastolic (congestive) heart failure: Secondary | ICD-10-CM | POA: Diagnosis not present

## 2016-04-04 NOTE — Telephone Encounter (Signed)
Called and spoke with [name] [name] and she is aware of ok for the PT and the home health aid.  Nothing further is needed.

## 2016-04-06 DIAGNOSIS — Z7901 Long term (current) use of anticoagulants: Secondary | ICD-10-CM | POA: Diagnosis not present

## 2016-04-06 DIAGNOSIS — M109 Gout, unspecified: Secondary | ICD-10-CM | POA: Diagnosis not present

## 2016-04-06 DIAGNOSIS — I281 Aneurysm of pulmonary artery: Secondary | ICD-10-CM | POA: Diagnosis not present

## 2016-04-06 DIAGNOSIS — M6281 Muscle weakness (generalized): Secondary | ICD-10-CM | POA: Diagnosis not present

## 2016-04-06 DIAGNOSIS — I272 Other secondary pulmonary hypertension: Secondary | ICD-10-CM | POA: Diagnosis not present

## 2016-04-06 DIAGNOSIS — I359 Nonrheumatic aortic valve disorder, unspecified: Secondary | ICD-10-CM | POA: Diagnosis not present

## 2016-04-06 DIAGNOSIS — Z9981 Dependence on supplemental oxygen: Secondary | ICD-10-CM | POA: Diagnosis not present

## 2016-04-06 DIAGNOSIS — I110 Hypertensive heart disease with heart failure: Secondary | ICD-10-CM | POA: Diagnosis not present

## 2016-04-06 DIAGNOSIS — M150 Primary generalized (osteo)arthritis: Secondary | ICD-10-CM | POA: Diagnosis not present

## 2016-04-06 DIAGNOSIS — H409 Unspecified glaucoma: Secondary | ICD-10-CM | POA: Diagnosis not present

## 2016-04-06 DIAGNOSIS — I5032 Chronic diastolic (congestive) heart failure: Secondary | ICD-10-CM | POA: Diagnosis not present

## 2016-04-06 DIAGNOSIS — H542 Low vision, both eyes: Secondary | ICD-10-CM | POA: Diagnosis not present

## 2016-04-06 DIAGNOSIS — I481 Persistent atrial fibrillation: Secondary | ICD-10-CM | POA: Diagnosis not present

## 2016-04-11 ENCOUNTER — Other Ambulatory Visit: Payer: Self-pay | Admitting: Family

## 2016-04-11 ENCOUNTER — Ambulatory Visit (INDEPENDENT_AMBULATORY_CARE_PROVIDER_SITE_OTHER): Payer: Medicare Other

## 2016-04-11 DIAGNOSIS — I4891 Unspecified atrial fibrillation: Secondary | ICD-10-CM | POA: Diagnosis not present

## 2016-04-11 DIAGNOSIS — I6350 Cerebral infarction due to unspecified occlusion or stenosis of unspecified cerebral artery: Secondary | ICD-10-CM

## 2016-04-11 DIAGNOSIS — I359 Nonrheumatic aortic valve disorder, unspecified: Secondary | ICD-10-CM | POA: Diagnosis not present

## 2016-04-11 DIAGNOSIS — I481 Persistent atrial fibrillation: Secondary | ICD-10-CM | POA: Diagnosis not present

## 2016-04-11 DIAGNOSIS — I110 Hypertensive heart disease with heart failure: Secondary | ICD-10-CM | POA: Diagnosis not present

## 2016-04-11 DIAGNOSIS — M109 Gout, unspecified: Secondary | ICD-10-CM | POA: Diagnosis not present

## 2016-04-11 DIAGNOSIS — H542 Low vision, both eyes: Secondary | ICD-10-CM | POA: Diagnosis not present

## 2016-04-11 DIAGNOSIS — H409 Unspecified glaucoma: Secondary | ICD-10-CM | POA: Diagnosis not present

## 2016-04-11 DIAGNOSIS — I281 Aneurysm of pulmonary artery: Secondary | ICD-10-CM | POA: Diagnosis not present

## 2016-04-11 DIAGNOSIS — Z9981 Dependence on supplemental oxygen: Secondary | ICD-10-CM | POA: Diagnosis not present

## 2016-04-11 DIAGNOSIS — Z7901 Long term (current) use of anticoagulants: Secondary | ICD-10-CM

## 2016-04-11 DIAGNOSIS — M6281 Muscle weakness (generalized): Secondary | ICD-10-CM | POA: Diagnosis not present

## 2016-04-11 DIAGNOSIS — M150 Primary generalized (osteo)arthritis: Secondary | ICD-10-CM | POA: Diagnosis not present

## 2016-04-11 DIAGNOSIS — I272 Other secondary pulmonary hypertension: Secondary | ICD-10-CM | POA: Diagnosis not present

## 2016-04-11 DIAGNOSIS — I5032 Chronic diastolic (congestive) heart failure: Secondary | ICD-10-CM | POA: Diagnosis not present

## 2016-04-11 LAB — POCT INR: INR: 2.8

## 2016-04-12 DIAGNOSIS — I5032 Chronic diastolic (congestive) heart failure: Secondary | ICD-10-CM | POA: Diagnosis not present

## 2016-04-12 DIAGNOSIS — Z9981 Dependence on supplemental oxygen: Secondary | ICD-10-CM | POA: Diagnosis not present

## 2016-04-12 DIAGNOSIS — I481 Persistent atrial fibrillation: Secondary | ICD-10-CM | POA: Diagnosis not present

## 2016-04-12 DIAGNOSIS — M109 Gout, unspecified: Secondary | ICD-10-CM | POA: Diagnosis not present

## 2016-04-12 DIAGNOSIS — M6281 Muscle weakness (generalized): Secondary | ICD-10-CM | POA: Diagnosis not present

## 2016-04-12 DIAGNOSIS — I272 Other secondary pulmonary hypertension: Secondary | ICD-10-CM | POA: Diagnosis not present

## 2016-04-12 DIAGNOSIS — H409 Unspecified glaucoma: Secondary | ICD-10-CM | POA: Diagnosis not present

## 2016-04-12 DIAGNOSIS — I110 Hypertensive heart disease with heart failure: Secondary | ICD-10-CM | POA: Diagnosis not present

## 2016-04-12 DIAGNOSIS — M150 Primary generalized (osteo)arthritis: Secondary | ICD-10-CM | POA: Diagnosis not present

## 2016-04-12 DIAGNOSIS — I281 Aneurysm of pulmonary artery: Secondary | ICD-10-CM | POA: Diagnosis not present

## 2016-04-12 DIAGNOSIS — Z7901 Long term (current) use of anticoagulants: Secondary | ICD-10-CM | POA: Diagnosis not present

## 2016-04-12 DIAGNOSIS — H542 Low vision, both eyes: Secondary | ICD-10-CM | POA: Diagnosis not present

## 2016-04-12 DIAGNOSIS — I359 Nonrheumatic aortic valve disorder, unspecified: Secondary | ICD-10-CM | POA: Diagnosis not present

## 2016-04-13 DIAGNOSIS — I5032 Chronic diastolic (congestive) heart failure: Secondary | ICD-10-CM | POA: Diagnosis not present

## 2016-04-13 DIAGNOSIS — I272 Other secondary pulmonary hypertension: Secondary | ICD-10-CM | POA: Diagnosis not present

## 2016-04-13 DIAGNOSIS — I281 Aneurysm of pulmonary artery: Secondary | ICD-10-CM | POA: Diagnosis not present

## 2016-04-13 DIAGNOSIS — M6281 Muscle weakness (generalized): Secondary | ICD-10-CM | POA: Diagnosis not present

## 2016-04-13 DIAGNOSIS — H409 Unspecified glaucoma: Secondary | ICD-10-CM | POA: Diagnosis not present

## 2016-04-13 DIAGNOSIS — I359 Nonrheumatic aortic valve disorder, unspecified: Secondary | ICD-10-CM | POA: Diagnosis not present

## 2016-04-13 DIAGNOSIS — M150 Primary generalized (osteo)arthritis: Secondary | ICD-10-CM | POA: Diagnosis not present

## 2016-04-13 DIAGNOSIS — Z9981 Dependence on supplemental oxygen: Secondary | ICD-10-CM | POA: Diagnosis not present

## 2016-04-13 DIAGNOSIS — H542 Low vision, both eyes: Secondary | ICD-10-CM | POA: Diagnosis not present

## 2016-04-13 DIAGNOSIS — I481 Persistent atrial fibrillation: Secondary | ICD-10-CM | POA: Diagnosis not present

## 2016-04-13 DIAGNOSIS — I110 Hypertensive heart disease with heart failure: Secondary | ICD-10-CM | POA: Diagnosis not present

## 2016-04-13 DIAGNOSIS — M109 Gout, unspecified: Secondary | ICD-10-CM | POA: Diagnosis not present

## 2016-04-13 DIAGNOSIS — Z7901 Long term (current) use of anticoagulants: Secondary | ICD-10-CM | POA: Diagnosis not present

## 2016-04-14 DIAGNOSIS — Z7901 Long term (current) use of anticoagulants: Secondary | ICD-10-CM | POA: Diagnosis not present

## 2016-04-14 DIAGNOSIS — H542 Low vision, both eyes: Secondary | ICD-10-CM | POA: Diagnosis not present

## 2016-04-14 DIAGNOSIS — I272 Other secondary pulmonary hypertension: Secondary | ICD-10-CM | POA: Diagnosis not present

## 2016-04-14 DIAGNOSIS — H409 Unspecified glaucoma: Secondary | ICD-10-CM | POA: Diagnosis not present

## 2016-04-14 DIAGNOSIS — I5032 Chronic diastolic (congestive) heart failure: Secondary | ICD-10-CM | POA: Diagnosis not present

## 2016-04-14 DIAGNOSIS — Z9981 Dependence on supplemental oxygen: Secondary | ICD-10-CM | POA: Diagnosis not present

## 2016-04-14 DIAGNOSIS — M6281 Muscle weakness (generalized): Secondary | ICD-10-CM | POA: Diagnosis not present

## 2016-04-14 DIAGNOSIS — I481 Persistent atrial fibrillation: Secondary | ICD-10-CM | POA: Diagnosis not present

## 2016-04-14 DIAGNOSIS — M150 Primary generalized (osteo)arthritis: Secondary | ICD-10-CM | POA: Diagnosis not present

## 2016-04-14 DIAGNOSIS — I110 Hypertensive heart disease with heart failure: Secondary | ICD-10-CM | POA: Diagnosis not present

## 2016-04-14 DIAGNOSIS — M109 Gout, unspecified: Secondary | ICD-10-CM | POA: Diagnosis not present

## 2016-04-14 DIAGNOSIS — I359 Nonrheumatic aortic valve disorder, unspecified: Secondary | ICD-10-CM | POA: Diagnosis not present

## 2016-04-14 DIAGNOSIS — I281 Aneurysm of pulmonary artery: Secondary | ICD-10-CM | POA: Diagnosis not present

## 2016-04-15 DIAGNOSIS — I509 Heart failure, unspecified: Secondary | ICD-10-CM | POA: Diagnosis not present

## 2016-04-16 ENCOUNTER — Other Ambulatory Visit: Payer: Self-pay | Admitting: Family

## 2016-04-17 ENCOUNTER — Ambulatory Visit (INDEPENDENT_AMBULATORY_CARE_PROVIDER_SITE_OTHER): Payer: Medicare Other | Admitting: Pulmonary Disease

## 2016-04-17 ENCOUNTER — Encounter: Payer: Self-pay | Admitting: Pulmonary Disease

## 2016-04-17 VITALS — BP 108/78 | HR 95 | Ht 71.0 in | Wt 186.6 lb

## 2016-04-17 DIAGNOSIS — I110 Hypertensive heart disease with heart failure: Secondary | ICD-10-CM | POA: Diagnosis not present

## 2016-04-17 DIAGNOSIS — H409 Unspecified glaucoma: Secondary | ICD-10-CM | POA: Diagnosis not present

## 2016-04-17 DIAGNOSIS — M159 Polyosteoarthritis, unspecified: Secondary | ICD-10-CM

## 2016-04-17 DIAGNOSIS — I5032 Chronic diastolic (congestive) heart failure: Secondary | ICD-10-CM | POA: Diagnosis not present

## 2016-04-17 DIAGNOSIS — I272 Other secondary pulmonary hypertension: Secondary | ICD-10-CM

## 2016-04-17 DIAGNOSIS — I10 Essential (primary) hypertension: Secondary | ICD-10-CM

## 2016-04-17 DIAGNOSIS — R0600 Dyspnea, unspecified: Secondary | ICD-10-CM | POA: Diagnosis not present

## 2016-04-17 DIAGNOSIS — Z23 Encounter for immunization: Secondary | ICD-10-CM | POA: Diagnosis not present

## 2016-04-17 DIAGNOSIS — N259 Disorder resulting from impaired renal tubular function, unspecified: Secondary | ICD-10-CM | POA: Diagnosis not present

## 2016-04-17 DIAGNOSIS — I4819 Other persistent atrial fibrillation: Secondary | ICD-10-CM

## 2016-04-17 DIAGNOSIS — I359 Nonrheumatic aortic valve disorder, unspecified: Secondary | ICD-10-CM | POA: Diagnosis not present

## 2016-04-17 DIAGNOSIS — Z9981 Dependence on supplemental oxygen: Secondary | ICD-10-CM | POA: Diagnosis not present

## 2016-04-17 DIAGNOSIS — I281 Aneurysm of pulmonary artery: Secondary | ICD-10-CM

## 2016-04-17 DIAGNOSIS — M109 Gout, unspecified: Secondary | ICD-10-CM | POA: Diagnosis not present

## 2016-04-17 DIAGNOSIS — M6281 Muscle weakness (generalized): Secondary | ICD-10-CM | POA: Diagnosis not present

## 2016-04-17 DIAGNOSIS — I481 Persistent atrial fibrillation: Secondary | ICD-10-CM | POA: Diagnosis not present

## 2016-04-17 DIAGNOSIS — M150 Primary generalized (osteo)arthritis: Secondary | ICD-10-CM

## 2016-04-17 DIAGNOSIS — Z7901 Long term (current) use of anticoagulants: Secondary | ICD-10-CM | POA: Diagnosis not present

## 2016-04-17 DIAGNOSIS — H542 Low vision, both eyes: Secondary | ICD-10-CM | POA: Diagnosis not present

## 2016-04-17 NOTE — Progress Notes (Signed)
Subjective:    Patient ID: [name] [name], male    DOB: 1926-04-22, 82 y.o.   MRN: [id number]  HPI 82 y/o BM here for a follow up visit... he has multiple medical problems including HBP, CAD, AS, AFib, & CHF followed by DrHochrein & the CHFclinic;  known PulmHTN & PA aneurysm conservatively managed by DrGearhardt;  Hyperchol;  RI & BPH;  DJD & Gout... ~  SEE PREV EPIC NOTES FOR OLDER DATA >>   ~  July 16, 2012:  73moROV & [name] [name] reports worsening vision due to his Glaucoma- on eye drops & followed closely by Ophthalmology;  BP was recently elev at DrTannenbaum's office & we decided to increase his doses of Clonidine & Metoprolol meds...  We reviewed the following medical problems during today's office visit>>     HBP> on MetoprololER100-1/2 daily, CalanSR120, Micardis80, Clonidine0.2-1/2Bid, Demadex20-3/d, Epleronone25, K20-3/d; BP=144/92 & he has sl HA; we decided to incr his Metopto 1061md & the Clonidine0.2 to 1 tab Bid...    CHF> followed by DrHochrein for Cards- w/ severe Diastolic CHF, normal C's by cath in 2005, & normal LV systolic function w/ EFBD=53%notes DOE- no change.    AS> w/ severely calcif AoV leaflets & mod AS w/ 2525mean gradient (peak=39); pt desired conservative Rx, DrHochrein's notes are reviewed...    AFib> on Coumadin via CC; stable AFib on Coumadin w/ rate control...    Pulm Art aneurysm & PulmHTN> O2sat=94% on RA at rest & he has home O2 but just uses it "prn";  3 cm PA aneurysm followed by DrGearhardt without change x yrs...    CHOL> on Lip20; last FLP 6/12 showed TChol 162, TG 95, HDL 46, LDL 97; he doesn't seem to remember to come fasting for f/u labs...    Renal Insuffic, BPH> on Flomax0.4; baseline Creat= 1.5-1.6 range; followed by DrTannenbaum & managed conservatively...    DJD, Gout> on Tramadol, Allopurinol100, VitD2000; stable w/o specific complaints at present... We reviewed prob list, meds, xrays and labs> see below for updates >> he had the 2013 Flu  vaccine in Oct...   NOTE> pt did not go to the lab for his BMet & BNP as requested...  ADDENDUM: DrHochrein ordered CDopplers 12/13> mild heterogeneous plaque on right & mixed irreg plaque on left, 0-39% bilat ICA stenoses & antegrade vertebral flow; f/u 44yr13yr  ~  January 14, 2013:  63mo 79mo& Keyshun is stable- no new complaints or concerns; he has a sm lipoma on the right post neck area (pointed out by the HouseLevi Strausse for [employer]);[facility] reviewed the following medical problems during today's office visit >>     HBP> on MetoprololER100, CalanSR120, Micardis80, Clonidine0.2Bid, Demadex20-3/d, Epleronone25, K20-3/d; BP=130/80 & he is asked to monitor BP at home more closely; he denies CP, palpit, dizzy, ch in SOB/DOE, edema...    CHF> followed by DrHochrein for Cards- w/ severe Diastolic CHF, normal C's by cath in 2005, & normal LV systolic function w/ EF=75GD=92%n 1/14- stable, notes DOE- no change, pt refused f/u 2DEcho, rec conservative Rx...    AS> w/ severely calcif AoV leaflets & mod AS w/ 25mm 83m gradient (peak=39); last 2DEcho 10/12- pt desired conservative Rx, DrHochrein's notes are reviewed...    AFib> on Coumadin via CC; stable AFib on Coumadin w/ rate control...    Pulm Art aneurysm & PulmHTN> O2sat=94% on RA at rest & he has home O2 but just uses it "prn";  3cm PA aneurysm followed  by DrGearhardt without change x yrs...    CHOL> on Lip20; last FLP 6/12 showed TChol 162, TG 95, HDL 46, LDL 97; asked to ret for f/u FASTING labs    Renal Insuffic, BPH> on Flomax0.4; baseline Creat= 1.4-1.6 range; followed by DrTannenbaum & managed conservatively, last seen 10/13- note reviewed...    DJD, Gout> on Tramadol, Allopurinol100, VitD2000; stable w/o specific complaints at present... We reviewed prob list, meds, xrays and labs> see below for updates >>   CXR 6/14 showed borderline cardiomeg, extremely tort Ao, PA dilatation is the same (no change), elev left hemidiaph, scarring at bases,  NAD...  LABS 6/14:  Pending...  ~  July 15, 2013:  [name] & [name] [name] is stable, here w/ his daighter who confirms; as noted he is blind in left eye w/ poor vision on right as well (can't read or drive, can see some TV from a distance), hx Glaucoma Rx by DrBond; he has not been exercising much- has a lCivil Service fast streamernamed [name] [name] & I suggested he get out & walk the dog w/ family help...  He has some minor somatic complaints- notes "ridges, wrinkles" on his scalp that seem to come & go, notes mild arthritis pain in back, VI/ edema in legs; we offered reassurance, discussed Aleve, Tylenol, no salt, elevation, support hose & continue current meds...    HBP> on MetoprololER100, CalanSR120, Micardis80, Clonidine0.2Bid, Demadex20-3/d, Epleronone25, K20-3/d; BP=132/78 & he denies CP, palpit, dizzy, ch in SOB/DOE, edema...    CHF> followed by DrHochrein for Cards- w/ severe Diastolic CHF, normal C's by cath in 2005, & normal LV systolic function w/ EEX=51% seen 1/14- stable, notes DOE- no change, pt refused f/u 2DEcho, rec conservative Rx...    AS> w/ severely calcif AoV leaflets & mod AS w/ 257mmean gradient (peak=39); last 2DEcho 10/12- pt desired conservative Rx, DrHochrein's notes are reviewed...    AFib> on Coumadin via CC; stable AFib [place] Cou[place]    Pulm Art aneurysm & PulmHTN> O2sat=98% on RA at rest today & he has home O2 but just uses it "prn";  3cm PA aneurysm followed by DrGearhardt without change x yrs...    CHOL> on Lip20; last FLP 6/14 showed TChol 150, TG 89, HDL 51, LDL 81... Continue same rx...    Renal Insuffic, BPH> on Flomax0.4; baseline Creat= 1.4-1.6 range; followed by DrTannenbaum & managed conservatively, last seen 10/13- note reviewed...    DJD, Gout> on Allopurinol100, VitD2000, Aleve/ Tylenol; stable w/o specific complaints at present...  ~  January 08, 2014:  [name] & Mang reports interval eval by DerPayton Mccallumr rash on scalp (looks like Seb  Dermatitis) and w/ Podiatry- he indicates improved...     HBP> on MetoprololER100, CalanSR120, Micardis80, Clonidine0.2Bid, Demadex20-3/d, Epleronone25, K20-3/d; BP=132/84 & he denies CP, palpit, dizzy, ch in SOB/DOE, edema...    CHF> followed by DrHochrein for Cards- w/ severe Diastolic CHF, normal C's by cath in 2005, & normal LV systolic function w/ EF=ZG=01%een 2/15- stable, notes DOE- no change, pt refused f/u 2DEcho, rec conservative Rx...    AS> w/ severely calcif AoV leaflets & mod AS w/ 3m19man gradient (peak=39); last 2DEcho 10/12- pt desired conservative Rx, DrHochrein's notes are reviewed...    AFib> on Coumadin via CC; stable AFib on Coumadin w/ rate control from [place] wants to continue as is...    Pulm Art aneurysm & PulmHTN> O2sat=95% on RA at rest today & he  has home O2 but just uses it "prn";  3cm PA aneurysm followed by DrGearhardt without change x yrs...    CHOL> on Lip20; FLP 6/15 shows TChol 147, TG 82, HDL 55, LDL 76... Stable, continue same rx...    Renal Insuffic, BPH> on Flomax0.4; baseline Creat= 1.4-1.6 range; followed by DrTannenbaum & managed conservatively, last seen 10/13- note reviewed...    DJD, Gout> on Allopurinol100, VitD2000, Aleve/ Tylenol; stable w/o specific complaints at present... We reviewed prob list, meds, xrays and labs> see below for updates >>   CXR 6/15 showed similar prominent PA segment, mild cardiomeg & enlarged desc thor Ao, clear lungs w/ elev left hemidiaph (stable), DJD in Tspine...   LABS 6/15:  FLP- at goals on Lip20;  Chems- ok w/ BS=121 on diet, Cr=1.6 stable RI;  CBC- wnl;  TSH=2.80;  Uric=6.2 on allopurinol100;  BNP=511  ~  November 16, 2015:  87moROV & Trevyn has received his PCP f/u w/ GTerri Piedra and continues to f/u w/ DrHochrein for Cards;  He is now 82y/o> He presents today c/o mucus in his lungs- notes sl cough, small amt yellow sput, no hemoptysis and incr SOB- all x several wks he says;  Denies f/c/s, no CP, & he  doesn't know what brought this on? He has Home O2 but only uses it prn- occas at night, occas days if SOB, does not use it w/ activ;  He has NOT been exercising "just walking in the house" & he notes DOE w/ ADLs "for some time now";  He has a lot of back pain, his glaucoma is worse w/ decr vision, & he recently saw Urology- on Flomax... We reviewed the following medical problems during today's office visit >> Note: pt requested further eval for his dyspnea which is actually a rather chronic problem...    Dyspnea> this is clearly multifactorial- 82y/o, sedentary/ deconditioned, diastolicCHF, modAS, AFib, PulmHTN; he has Home O2 but only uses it "prn"    HBP> on MetoprololER100, CalanSR120, Micardis80, Clonidine0.2Bid, Demadex20-3/d, Epleronone25, K20-3/d; BP=112/82 & he denies CP, palpit, dizzy, edema but has SOB/DOE as above...    CHF> followed by DrHochrein for Cards- w/ severe Diastolic CHF, normal C's by cath in 2005, & normal LV systolic function w/ ESE=83% seen 1/17- stable, notes DOE- no change, pt refused f/u 2DEcho, wants conservative Rx...    AS> w/ severely calcif AoV leaflets & mod AS w/ 267mmean gradient (peak=39); last 2DEcho 10/12- pt desired conservative Rx, DrHochrein's notes are reviewed...    AFib> on Coumadin via CC; stable AFib on Coumadin [place]/ [place] continue as is...    Pulm Art aneurysm & PulmHTN> O2sat=93% on RA at rest today & he has home O2 but just uses it "prn";  3cm PA aneurysm followed by DrGearhardt without change x yrs...    CHOL> on Lip20; FLP last 6/15 showed TChol 147, TG 82, HDL 55, LDL 76... Stable, continue same rx...    Renal Insuffic, BPH> on Flomax0.4; baseline Creat= 1.5-1.7 range; followed by DrTannenbaum & managed conservatively, no recent notes avail to review...    DJD, Gout> on Allopurinol100, VitD2000, Aleve/ Tylenol; stable w/o specific complaints at present... EXAM shows Afeb, VSS, O2sat=93% on RA;  HEENT- neg,  mallampati2;  Chest- clear w/o w/r/r;  Heart- irreg AFib, Gr2/6SEM w/o r/g;  Abd- soft, nontender, neg;  Ext- neg w/o c/c/e;  Neuro- no focal deficits...  CXR 11/16/15>  elev left hemidiaph, markedly  tort Ao, right PA dilatation, sl incr markings, NAD...  Spirometry 11/16/15>  FVC=1.34 (37%), FEV1=0.95 (36%), %1sec=71%, mid-flows are reduced at 23% predicted;  This is c/w severe pulm restriction and superimposed small airways dis.  Ambulatory Oximetry 11/16/15>  O2sat=100% on RA at rest w/ pulse=75/min; He ambulated 1 Lap only & stopped due to fatigue, leg & back pain w/ lowest O2sat=92% w/ pulse 120/min  LABS 11/16/15>  Chems- ok x BUN=38, Cr=1.75;  CBC- ok w/ Hg=13.6;  TSH=2.58;  BNP=610;  Sed=41  2DEcho 12/06/15>  ModLVH, norm LVF w/ EF=60-65% & norm wall motion, AoV w/ thickened calcif leaflets & modAS, severe LAdil (74m) & RAdil, mildly reduced RVF, severeTR, mod pulmHTN w/ PAsys=64... IMP/PLAN>>  CRenleyis 82y/o w/ mult chronic medical issues- modAS, mod pulmHTN & reduced RVF;  I told him that the main therapeutic intervention at this point is to convince him to use his O2 at 2L/min continuously 24/7 (think of it as a medication);  Concentrate on good deep breaths & consider IS; use Mucinex600- 1to2Bid for the congestion  ~  January 13, 2016:  234moOV & ChSchuyleras been wearing his O2 regularly at night & more often during the day but still not 24/7 as recommended;  His [place]CP is [place]Terri [place] is 8892/o w/ AS, diastolicCHF, AFib, pulm art aneurysm & pulmHTN on a complex medical regimen-- Coumadin, ToprolXL100, VerapamilER120, Micardis80, Catapress0.2Bid, Demadex20-3/d, Eplerenone25, K20-3/d and I wonder if he's taking all this regularly;  He is here w/ his daughter today & her main issue is to request HOME HEALTH help as she says he qualifies for 35H/wk- OK...     We reviewed above studies-- CHF w/ BNP=610 but BUB=38 & Cr=1.75 therefore not much wiggle room w/ his diuretics => we will  refer to CHF clinic for their input... EXAM shows Afeb, VSS, O2sat=92% on RA;  HEENT- neg, mallampati2;  Chest- clear w/o w/r/r;  Heart- irreg AFib, Gr2/6SEM w/o r/g;  Abd- soft, nontender, neg;  Ext- neg w/o c/c/e;  Neuro- no focal deficits... IMP/PLAN>>  We discussed referral to the CHF clinic, DrElmirafor his input regarding meds & optimization...  ~  April 17, 2016:  44m64moV & ChaChavis improved- having refused the CHF clinic he ret to PCP office w/ VI, stasis changes, ?mosquito bite then soaked feet in chlorox!  Rx w/ Doxy, no salt, elevation, ACE wrap, etc... He notes that edema is resolved & he is using his oxygen more often now, plus home PT exercises + walking his dalmation/lab mix "[name] [name]"...     He remains on O2 at 2L/min more often- supposed to be 24/7...    On Demadex20-2Qam&1Qpm + Eplerenone 6m22m K30- 3/d;  Edema is down & wt= 187#...     BP controlled w/ this + MetopER100, VerapER120, Micardis80, Clonidine0.2Bid; BP= 110/78 & he denies CP, palpit, ch in SOB/ edema/ etc... EXAM shows Afeb, VSS, O2sat=96% on O2 at 2L;  HEENT- neg, mallampati2;  Chest- clear w/o w/r/r;  Heart- irreg AFib, Gr2/6SEM w/o r/g;  Abd- soft, nontender, neg;  Ext- neg w/o c/c/ tr edema;  Neuro- no focal deficits... IMP/PLAN>>  CharNakaiimproved, continue current meds the same + his home oxygen at 2L/min;  Given 2017 FLU vaccine today 7 encouraged regular exercise program... we plan rov 60mo.10mo       Problem List:  OPHTHALMOLOGY - s/p cataract surg & followed by DrBond for worsening Glaucoma & poor vision  on gtts daily...  PULMONARY HYPERTENSION (ICD-416.8) - on home oxygen 2L/min "Prn"... ~  Prev 2DEcho w/ est PAsys= 64... he has not had a right heart cath... ~  Repeat 2DEcho 10/12 showed mod LVH, norm sys function w/ EF=55-60% & norm wall motion, Gr2DD, severely calcif AoV leaflets w/ mod AS & 34m mean gradient (peak=39), mild MR, severe LAdil & RAdil, RV sys function normal, modTR, PAsys=43... ~   6/13:  O2sat today is 95% on RA at rest;  CXR 6/13 showed mod cardiomeg unchanged, ectasia & calcif in Ao, enlarged right pulm art (no change), elev left hemidiaph w/ bibasilar atx, NAD... ~  6/14:  O2sat today is 94% on RA at rest;  CXR 6/14 showed borderline cardiomeg, extremely tort Ao, PA dilatation is the same (no change), elev left hemidiaph, scarring at bases, NAD... ~  6/15:  O2sat today is 95% on RA at rest;  CXR 6/15 showed similar prominent PA segment, mild cardiomeg & enlarged desc thor Ao, clear lungs w/ elev left hemidiaph (stable), DJD in Tspine.  HYPERTENSION (ICD-401.9) -  ~  on METOPROLOL ER 1085m 1/2 tab daily,  VERAPAMIL 24064m,  MICARDIS 77m59m  CLONIDINE 0.2mg-74m2 Bid,  DEMADEX 20mg-39m & 1pm,  EPLERENONE 25mg/d52mK20- 3/d; he was prev on Minoxidil  but this discontinued by Cardiology... ~  6/10: DrHochrein added back the Clonidine & asked him, again, to monitor BP at home. ~  12/10:  they requested trial to decr K20's from 6/d required due to his Demedex... suggest adding ALDACTONE 25mg/d 34mcr K20 to 4/d w/ f/u labs... ~  6/11:  Aldactone caused gynecomastia & was switched to EPLERENONE 25mg/d b51mryParker w/ some improvment in side effect. ~  6/12:  BP controlled & labs sl improved w/ the Demadex decr to 3/d... ~  12/12:  BP= 126/72 today and not checking at home, tolerating meds well; denies HA, visual changes, CP, palipit, dizziness, syncope; he has chr DOE without change and mild edema... ~  6/13:  BP= 120/72 & he remains stable w/o CP, palpit, +SOB w/o change, tr edema... ~  12/13: on MetoprololER100-1/2 daily, CalanSR120, Micardis80, Clonidine0.2-1/2Bid, Demadex20-3/d, Epleronone25, K20-3/d; BP=144/92 & he has sl HA; we decided to incr his Metopto 100mg/d & 42mClonidine0.2 to 1 tab Bid. ~  6/14: on MetoprololER100, CalanSR120, Micardis80, Clonidine0.2Bid, Demadex20-3/d, Epleronone25, K20-3/d; BP=130/80 & he is asked to monitor BP at home more closely; he denies  CP, palpit, dizzy, ch in SOB/DOE, edema. ~  12/14: on MetoprololER100, CalanSR120, Micardis80, Clonidine0.2Bid, Demadex20-3/d, Epleronone25, K20-3/d; BP=132/78 & he denies CP, palpit, dizzy, ch in SOB/DOE, edema. ~  6/15: on MetoprololER100, CalanSR120, Micardis80, Clonidine0.2Bid, Demadex20-3/d, Epleronone25, K20-3/d; BP=132/84 & he remains asymptomatic but sedentary...  CHF (ICD-428.0) - see meds above... followed in the CHF Clinic by DrHochrein;  he has severe Diastolic CHF, w/ normal C's by cath in 2005, & normal LV systolic function w/ EF=75%... KV=42%DOE- no change. ~  labs 6/09 showed BNP= 761 ~  labs 9/09 showed BNP= 486 ~  labs 12/09 showed BNP = 568 ~  labs 6/10 showed BNP= 421 ~  labs 12/10 showed BNP= 577... adding SPIRONOLACTONE 25mg/d. ~ 18ms 1/11 showed BNP= 395... later ch to EPLERENONE 25mg/d. ~  34m 12/11 showed BNP= 535 ~  Labs 6/12 showed BNP= 524 (on Demedex3/d + Eplerenone25mg/d). ~  36m 12/12 showed BNP= 371 ~  4/13:  He had f/u DrHochrein> Mod AS, chr AFib, DD, etc; stable, no progressive symptoms &  pt preferred conserv Rx; they discussed poss TAVR in future... ~  Labs 6/13 showed BNP= 381 (note: BUN=30 Creat=1.5) ~  1/14:  He had f/u DrHochrein> mult cardiovasc problems, doing well, no CP etc; rec salt & fluid restriction, pt declined f/u 2DEcho, on Coumadin & rate control, continue conservative management... ~  2/15:  He had Cards f/u w/ DrHochrein> stable, notes DOE- no change, pt refused f/u 2DEcho, rec conservative Rx. ~  Stable on diuretic therapy, no changes made...  AORTIC STENOSIS (ICD-424.1) - moderate by 2DEcho & followed by DrHochrein... ~  repeat 2DEcho 1/09 hosp = similiar mod AS... ~  Repeat 2DEcho 1/12 showed mild LVH, norm sys function w/ EF=55-60% & no regional wall motion abn, mod AS, dilated LA&RA, modTR, PAsys=50... ~  Repeat 2DEcho 10/12 showed mod LVH, norm sys function w/ EF=55-60% & norm wall motion, Gr2DD, severely calcif AoV leaflets w/ mod  AS & 67m mean gradient (peak=39), mild MR, severe LAdil & RAdil, RV sys function normal, modTR, PAsys=43... ~  Pt has refused f/u 2DEcho...  ATRIAL FIBRILLATION (ICD-427.31) - on COUMADIN & followed in the Coumadin Clinic. ~  EKG 4/13 showed AFib, rate82, LAD, NSSTTWA...  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 ~  CXR 1/09 showed unchanged right PA aneurysm, stable cardiomeg, elevated left hemidiaph w/ atx. ~  CXR 6/10 showed no change, Ao tortuous and calcif, NAD. ~  CXR 12/11 showed stable elv left hemidiaph, bibasilar atx/ scarring, cardiomeg, ectatic ao, right PA enlargement. ~  CXR 6/13 showed mod cardiomeg unchanged, ectasia & calcif in Ao, enlarged right pulm art (no change), elev left hemidiaph w/ bibasilar atx, NAD..Marland Kitchen ~  CXR 6/14 showed borderline cardiomeg, tortAo, PA dilatation is the same (no change), elev left hemidiaph, scarring at bases, NAD. ~  CXR 6/15 showed similar prominent PA segment, mild cardiomeg & enlarged desc thor Ao, clear lungs w/ elev left hemidiaph (stable), DJD in Tspine.  ASPVD >> on Coumadin for his AFib... ~  DrHochrein ordered CDopplers 12/13> mild heterogeneous plaque on right & mixed irreg plaque on left, 0-39% bilat ICA stenoses & antegrade vertebral flow; f/u 115yr.  HYPERCHOLESTEROLEMIA (ICD-272.0) - now on LIPITOR 2055m (prev Simva40 changed due to concomitant Verap). ~  FLPAugusta09 on Simva40 showed TChol 95, TG 39, HDL 36, LDL 51 ~  FLP 12/09 on Simva40 showed TChol 167, TG 68, HDL 64, LDL 90 ~  FLP 1/11 on Simva40 showed TChol 153, TG 101, HDL 50, LDL 83... later ch to Lip[place]  FLPWapello12 on Lip20 showed TChol 162, TG 95, HDL 46, LDL 97 ~  6/13: not fasting for FLP today... ~  6/14: FLP on Lip20 showed TChol 150, TG 89, HDL 51, LDL 81... ~  FLP 6/15 on Lip20 showed TChol 147, TG 82, HDL 55, LDL 76...  HEMORRHOIDS (ICD-455.6)  RENAL INSUFFICIENCY (ICD-588.9)  ~  Creat=1.4 - 1.6  during 1/09 hosp... ~  labs 6/09 & 9/09 showed Creat= 1.3 ~  labs 12/09 showed BUN= 20, Creat= 1.3, K= 4.0 ~  labs 6/10 showed BUN= 24, Creat= 1.3, K= 3.8 ~  labs 12/10 showed BUN= 12, Creat= 1.2, K= 3.6 (on 6 K20/d) ~  labs 4/11 showed BUN= 18, Creat= 1.4, K= 4.0 (on 4 KCl/d) ~  labs 12/11 showed BUN= 32, Creat= 1.8, K= 4.3 ~  Labs 6/12 showed BUN= 27, creat= 1.6, K= 4.1 ~  Labs 12/12 showed BUN=21, Creat=1.6, K=3.9 ~  Labs 6/13 showed BUN= 30, Creat= 1.5, K= 4.5 ~  Labs 6/14 showed BUN= 26, Cr= 1.5, K= 3.5 (encouraged to take the KCl 1mq- 3/d)... ~  Labs 6/15 showed BUN= 25, Cr= 1.6, K= 3.9  BENIGN PROSTATIC HYPERTROPHY, HX OF (ICD-V13.8) - followed by DrTannenbaum & taking FLOMAX 0.498md... last seen 5/09 and his note is reviewed... ~  labs 12/09 showed PSA= 1.42 ~  labs 2/11 showed PSA= 1.65 ~  Labs 6/13 showed PSA= 1.58 ~  10/13: he had Urology f/u DrTannenbaum> BPH w/ BOO, nocturia, mild Uincont; he didn't want any intervention & no changes made to his regimen...  CVA (ICD-434.91) - Stroke in 1990 in the setting of HBP...  DEGENERATIVE JOINT DISEASE, GENERALIZED (ICD-715.00) - he uses OTC pain meds & TRAMADOL 5090mrn...  GOUT (ICD-274.9) - on ALLOPURINOL 100m37m  & now off the Colchicine... ~  labs 10/08 showed Uric= 10.3 ~  labs 12/09 showed Uric= 7.7... hMarland Kitchen wants to continue same meds. ~  labs 12/11 showed Uric = 6.4 ~  Labs 6/15 showed Uric= 6.2  INGUINAL HERNIA (ICD-550.90)  Health Maintenance - he receives the yearly seasonal Flu vaccines... f/u PNEUMOVAX at his request 2010...   Past Surgical History:  Procedure Laterality Date  . APPENDECTOMY    . CATARACT EXTRACTION    . cosmetic eye surgery    . INGUINAL HERNIA REPAIR      Outpatient Encounter Prescriptions as of 04/17/2016  Medication Sig Dispense Refill  . allopurinol (ZYLOPRIM) 100 MG tablet TAKE 1 TABLET BY MOUTH DAILY 30 tablet 0  . atorvastatin (LIPITOR) 20 MG tablet TAKE 1 TABLET BY MOUTH DAILY 30  tablet 8  . bimatoprost (LUMIGAN) 0.03 % ophthalmic solution Place 1 drop into both eyes at bedtime.      . brinzolamide (AZOPT) 1 % ophthalmic suspension Place 1 drop into both eyes 2 (two) times daily.      . Cholecalciferol (VITAMIN D) 2000 UNITS CAPS Take 1 capsule by mouth daily.      . cloNIDine (CATAPRES) 0.2 MG tablet TAKE 1 TABLET BY MOUTH TWICE DAILY 180 tablet 0  . doxycycline (VIBRA-TABS) 100 MG tablet Take 1 tablet (100 mg total) by mouth 2 (two) times daily. 20 tablet 0  . eplerenone (INSPRA) 25 MG tablet TAKE 1 TABLET BY MOUTH EVERY DAY 90 tablet 0  . LUMIGAN 0.01 % SOLN INT 1 GTT IN OU Q NIGHT  11  . meclizine (ANTIVERT) 25 MG tablet Take 1 tablet (25 mg total) by mouth 3 (three) times daily as needed. 50 tablet 5  . metoprolol succinate (TOPROL-XL) 100 MG 24 hr tablet Take 1 tablet (100 mg total) by mouth daily. Take with or immediately following a meal. 30 tablet 5  . mupirocin ointment (BACTROBAN) 2 % Place 1 application into the nose 2 (two) times daily. 22 g 0  . pilocarpine (PILOCAR) 4 % ophthalmic solution 1 drop into left eye every 4 hrs as needed    . potassium chloride SA (K-DUR,KLOR-CON) 20 MEQ tablet TAKE 3 TABLETS BY MOUTH DAILY 270 tablet 1  . Tamsulosin HCl (FLOMAX) 0.4 MG CAPS Take 0.4 mg by mouth daily.      . teMarland Kitchenmisartan (MICARDIS) 80 MG tablet TAKE 1 TABLET BY MOUTH DAILY 90 tablet 0  . telmisartan (MICARDIS) 80 MG tablet TAKE 1 TABLET BY MOUTH DAILY 90 tablet 0  . torsemide (DEMADEX) 20 MG tablet TAKE 2 TABLETS BY MOUTH IN THE MORNING AND 1 TABLET IN THE EVENING 270  tablet 0  . verapamil (CALAN-SR) 120 MG CR tablet TAKE 1 TABLET BY MOUTH EVERY NIGHT AT BEDTIME 30 tablet 8  . warfarin (COUMADIN) 5 MG tablet Take 1 tablet by mouth daily or as directed by coumadin clinic 90 tablet 1  . [DISCONTINUED] allopurinol (ZYLOPRIM) 100 MG tablet TAKE 1 TABLET BY MOUTH DAILY 30 tablet 0  . [DISCONTINUED] eplerenone (INSPRA) 25 MG tablet TAKE 1 TABLET BY MOUTH EVERY DAY 90  tablet 0   No facility-administered encounter medications on file as of 04/17/2016.     Allergies  Allergen Reactions  . Ibuprofen     REACTION: causes low blood pressure    Current Medications, Allergies, Past Medical History, Past Surgical History, Family History, and Social History were reviewed in [employer] record.    Review of Systems        See HPI - all other systems neg except as noted... The patient complains of decreased hearing, dyspnea on exertion, peripheral edema, and difficulty walking.  The patient denies anorexia, fever, weight loss, weight gain, vision loss, hoarseness, chest pain, syncope, prolonged cough, headaches, hemoptysis, abdominal pain, melena, hematochezia, severe indigestion/heartburn, hematuria, incontinence, muscle weakness, suspicious skin lesions, transient blindness, depression, unusual weight change, abnormal bleeding, enlarged lymph nodes, and angioedema.   Objective:   Physical Exam     WD, WN, 82 y/o BM in NAD... GENERAL:  Alert & oriented; pleasant & cooperative... HEENT:  [place]/AT, EOM-wnl, PERRLA, EACs-clear, TMs-wnl, NOSE-clear, THROAT-clear & wnl. NECK:  Supple w/ fairROM; no JVD; sl decr carotid impulses w/ transmit murmur; no thyromegaly or nodules palpated; no lymphadenopathy. CHEST:  Clear to P & A; without wheezes/ rales/ or rhonchi heard... HEART:  sl irreg, gr 2/6 AS murmur at base, without rubs or gallops apprec... ABDOMEN:  Soft & nontender; normal bowel sounds; no organomegaly or masses detected. EXT: without deformities, mild arthritic changes; no varicose veins/ +venous insuffic/ tr edema. NEURO:  CN's intact; no focal neuro deficits... DERM:  No lesions noted; no rash etc...  RADIOLOGY DATA:  Reviewed in the EPIC EMR & discussed w/ the patient...  LABORATORY DATA:  Reviewed in the EPIC EMR & discussed w/ the patient...   Assessment & Plan:    HBP>  Adeq control on his extensive med regimen; we  reviewed meds w/ family- they need to supervise & be sure he takes everything regularly...  CHF>  Stable on current meds; renal w/ Creat=1.75 stable as well...  AS>  Mod AS on 2DEcho & followed by DrHochrein.  AFib>  He remains on Coumadin followed in the [facility]...  PA art aneurysm & Pulm HTN>  Stable on current med regimen & BP control...  CHOL>  FLP looks good on Lip20...  Renal Insuffic>  Creat improved to 1.5-1.8 & stable on current meds...  DJD/ Gout>  Stable as well, c/o some rib pain as noted; we discussed rest/ heat/ rib binder & Tramadol...  Other medical problems as noted...   Patient's Medications  New Prescriptions   No medications on file  Previous Medications   ALLOPURINOL (ZYLOPRIM) 100 MG TABLET    TAKE 1 TABLET BY MOUTH DAILY   ATORVASTATIN (LIPITOR) 20 MG TABLET    TAKE 1 TABLET BY MOUTH DAILY   BIMATOPROST (LUMIGAN) 0.03 % OPHTHALMIC SOLUTION    Place 1 drop into both eyes at bedtime.     BRINZOLAMIDE (AZOPT) 1 % OPHTHALMIC SUSPENSION    Place 1 drop into both eyes 2 (two) times daily.  CHOLECALCIFEROL (VITAMIN D) 2000 UNITS CAPS    Take 1 capsule by mouth daily.     CLONIDINE (CATAPRES) 0.2 MG TABLET    TAKE 1 TABLET BY MOUTH TWICE DAILY   DOXYCYCLINE (VIBRA-TABS) 100 MG TABLET    Take 1 tablet (100 mg total) by mouth 2 (two) times daily.   EPLERENONE (INSPRA) 25 MG TABLET    TAKE 1 TABLET BY MOUTH EVERY DAY   LUMIGAN 0.01 % SOLN    INT 1 GTT IN OU Q NIGHT   MECLIZINE (ANTIVERT) 25 MG TABLET    Take 1 tablet (25 mg total) by mouth 3 (three) times daily as needed.   METOPROLOL SUCCINATE (TOPROL-XL) 100 MG 24 HR TABLET    Take 1 tablet (100 mg total) by mouth daily. Take with or immediately following a meal.   MUPIROCIN OINTMENT (BACTROBAN) 2 %    Place 1 application into the nose 2 (two) times daily.   PILOCARPINE (PILOCAR) 4 % OPHTHALMIC SOLUTION    1 drop into left eye every 4 hrs as needed   POTASSIUM CHLORIDE SA (K-DUR,KLOR-CON) 20 MEQ TABLET    TAKE 3  TABLETS BY MOUTH DAILY   TAMSULOSIN HCL (FLOMAX) 0.4 MG CAPS    Take 0.4 mg by mouth daily.     TELMISARTAN (MICARDIS) 80 MG TABLET    TAKE 1 TABLET BY MOUTH DAILY   TELMISARTAN (MICARDIS) 80 MG TABLET    TAKE 1 TABLET BY MOUTH DAILY   TORSEMIDE (DEMADEX) 20 MG TABLET    TAKE 2 TABLETS BY MOUTH IN THE MORNING AND 1 TABLET IN THE EVENING   VERAPAMIL (CALAN-SR) 120 MG CR TABLET    TAKE 1 TABLET BY MOUTH EVERY NIGHT AT BEDTIME   WARFARIN (COUMADIN) 5 MG TABLET    Take 1 tablet by mouth daily or as directed by coumadin clinic  Modified Medications   No medications on file  Discontinued Medications   No medications on file

## 2016-04-17 NOTE — Patient Instructions (Addendum)
Today we updated your med list in our EPIC system...    Continue your current medications the same...  Today we gave you the 2017 FLU vaccine...   Stay as active as possible & use your oxygen as we discussed...  Call for any questions...  Let's plan a follow up visit in 13mo, sooner if needed for problems.Marland KitchenMarland Kitchen

## 2016-04-18 DIAGNOSIS — Z9981 Dependence on supplemental oxygen: Secondary | ICD-10-CM | POA: Diagnosis not present

## 2016-04-18 DIAGNOSIS — M109 Gout, unspecified: Secondary | ICD-10-CM | POA: Diagnosis not present

## 2016-04-18 DIAGNOSIS — I110 Hypertensive heart disease with heart failure: Secondary | ICD-10-CM | POA: Diagnosis not present

## 2016-04-18 DIAGNOSIS — M150 Primary generalized (osteo)arthritis: Secondary | ICD-10-CM | POA: Diagnosis not present

## 2016-04-18 DIAGNOSIS — M6281 Muscle weakness (generalized): Secondary | ICD-10-CM | POA: Diagnosis not present

## 2016-04-18 DIAGNOSIS — I281 Aneurysm of pulmonary artery: Secondary | ICD-10-CM | POA: Diagnosis not present

## 2016-04-18 DIAGNOSIS — I272 Other secondary pulmonary hypertension: Secondary | ICD-10-CM | POA: Diagnosis not present

## 2016-04-18 DIAGNOSIS — I481 Persistent atrial fibrillation: Secondary | ICD-10-CM | POA: Diagnosis not present

## 2016-04-18 DIAGNOSIS — H409 Unspecified glaucoma: Secondary | ICD-10-CM | POA: Diagnosis not present

## 2016-04-18 DIAGNOSIS — I5032 Chronic diastolic (congestive) heart failure: Secondary | ICD-10-CM | POA: Diagnosis not present

## 2016-04-18 DIAGNOSIS — H542 Low vision, both eyes: Secondary | ICD-10-CM | POA: Diagnosis not present

## 2016-04-18 DIAGNOSIS — I359 Nonrheumatic aortic valve disorder, unspecified: Secondary | ICD-10-CM | POA: Diagnosis not present

## 2016-04-18 DIAGNOSIS — Z7901 Long term (current) use of anticoagulants: Secondary | ICD-10-CM | POA: Diagnosis not present

## 2016-04-19 DIAGNOSIS — I509 Heart failure, unspecified: Secondary | ICD-10-CM | POA: Diagnosis not present

## 2016-04-20 DIAGNOSIS — I281 Aneurysm of pulmonary artery: Secondary | ICD-10-CM | POA: Diagnosis not present

## 2016-04-20 DIAGNOSIS — M109 Gout, unspecified: Secondary | ICD-10-CM | POA: Diagnosis not present

## 2016-04-20 DIAGNOSIS — I272 Other secondary pulmonary hypertension: Secondary | ICD-10-CM | POA: Diagnosis not present

## 2016-04-20 DIAGNOSIS — H409 Unspecified glaucoma: Secondary | ICD-10-CM | POA: Diagnosis not present

## 2016-04-20 DIAGNOSIS — Z7901 Long term (current) use of anticoagulants: Secondary | ICD-10-CM | POA: Diagnosis not present

## 2016-04-20 DIAGNOSIS — Z9981 Dependence on supplemental oxygen: Secondary | ICD-10-CM | POA: Diagnosis not present

## 2016-04-20 DIAGNOSIS — M6281 Muscle weakness (generalized): Secondary | ICD-10-CM | POA: Diagnosis not present

## 2016-04-20 DIAGNOSIS — H542 Low vision, both eyes: Secondary | ICD-10-CM | POA: Diagnosis not present

## 2016-04-20 DIAGNOSIS — M150 Primary generalized (osteo)arthritis: Secondary | ICD-10-CM | POA: Diagnosis not present

## 2016-04-20 DIAGNOSIS — I359 Nonrheumatic aortic valve disorder, unspecified: Secondary | ICD-10-CM | POA: Diagnosis not present

## 2016-04-20 DIAGNOSIS — I481 Persistent atrial fibrillation: Secondary | ICD-10-CM | POA: Diagnosis not present

## 2016-04-20 DIAGNOSIS — I5032 Chronic diastolic (congestive) heart failure: Secondary | ICD-10-CM | POA: Diagnosis not present

## 2016-04-20 DIAGNOSIS — I110 Hypertensive heart disease with heart failure: Secondary | ICD-10-CM | POA: Diagnosis not present

## 2016-04-24 DIAGNOSIS — I110 Hypertensive heart disease with heart failure: Secondary | ICD-10-CM | POA: Diagnosis not present

## 2016-04-24 DIAGNOSIS — H409 Unspecified glaucoma: Secondary | ICD-10-CM | POA: Diagnosis not present

## 2016-04-24 DIAGNOSIS — I5032 Chronic diastolic (congestive) heart failure: Secondary | ICD-10-CM | POA: Diagnosis not present

## 2016-04-24 DIAGNOSIS — M109 Gout, unspecified: Secondary | ICD-10-CM | POA: Diagnosis not present

## 2016-04-24 DIAGNOSIS — I281 Aneurysm of pulmonary artery: Secondary | ICD-10-CM | POA: Diagnosis not present

## 2016-04-24 DIAGNOSIS — I359 Nonrheumatic aortic valve disorder, unspecified: Secondary | ICD-10-CM | POA: Diagnosis not present

## 2016-04-24 DIAGNOSIS — I481 Persistent atrial fibrillation: Secondary | ICD-10-CM | POA: Diagnosis not present

## 2016-04-24 DIAGNOSIS — M150 Primary generalized (osteo)arthritis: Secondary | ICD-10-CM | POA: Diagnosis not present

## 2016-04-24 DIAGNOSIS — Z9981 Dependence on supplemental oxygen: Secondary | ICD-10-CM | POA: Diagnosis not present

## 2016-04-24 DIAGNOSIS — H542 Low vision, both eyes: Secondary | ICD-10-CM | POA: Diagnosis not present

## 2016-04-24 DIAGNOSIS — M6281 Muscle weakness (generalized): Secondary | ICD-10-CM | POA: Diagnosis not present

## 2016-04-24 DIAGNOSIS — Z7901 Long term (current) use of anticoagulants: Secondary | ICD-10-CM | POA: Diagnosis not present

## 2016-04-24 DIAGNOSIS — I272 Other secondary pulmonary hypertension: Secondary | ICD-10-CM | POA: Diagnosis not present

## 2016-04-25 DIAGNOSIS — I272 Other secondary pulmonary hypertension: Secondary | ICD-10-CM | POA: Diagnosis not present

## 2016-04-25 DIAGNOSIS — I5032 Chronic diastolic (congestive) heart failure: Secondary | ICD-10-CM | POA: Diagnosis not present

## 2016-04-25 DIAGNOSIS — Z9981 Dependence on supplemental oxygen: Secondary | ICD-10-CM | POA: Diagnosis not present

## 2016-04-25 DIAGNOSIS — I359 Nonrheumatic aortic valve disorder, unspecified: Secondary | ICD-10-CM | POA: Diagnosis not present

## 2016-04-25 DIAGNOSIS — I110 Hypertensive heart disease with heart failure: Secondary | ICD-10-CM | POA: Diagnosis not present

## 2016-04-25 DIAGNOSIS — M109 Gout, unspecified: Secondary | ICD-10-CM | POA: Diagnosis not present

## 2016-04-25 DIAGNOSIS — Z7901 Long term (current) use of anticoagulants: Secondary | ICD-10-CM | POA: Diagnosis not present

## 2016-04-25 DIAGNOSIS — H409 Unspecified glaucoma: Secondary | ICD-10-CM | POA: Diagnosis not present

## 2016-04-25 DIAGNOSIS — M150 Primary generalized (osteo)arthritis: Secondary | ICD-10-CM | POA: Diagnosis not present

## 2016-04-25 DIAGNOSIS — M6281 Muscle weakness (generalized): Secondary | ICD-10-CM | POA: Diagnosis not present

## 2016-04-25 DIAGNOSIS — I281 Aneurysm of pulmonary artery: Secondary | ICD-10-CM | POA: Diagnosis not present

## 2016-04-25 DIAGNOSIS — H542 Low vision, both eyes: Secondary | ICD-10-CM | POA: Diagnosis not present

## 2016-04-25 DIAGNOSIS — I481 Persistent atrial fibrillation: Secondary | ICD-10-CM | POA: Diagnosis not present

## 2016-04-27 DIAGNOSIS — H542 Low vision, both eyes: Secondary | ICD-10-CM | POA: Diagnosis not present

## 2016-04-27 DIAGNOSIS — M109 Gout, unspecified: Secondary | ICD-10-CM | POA: Diagnosis not present

## 2016-04-27 DIAGNOSIS — M150 Primary generalized (osteo)arthritis: Secondary | ICD-10-CM | POA: Diagnosis not present

## 2016-04-27 DIAGNOSIS — Z7901 Long term (current) use of anticoagulants: Secondary | ICD-10-CM | POA: Diagnosis not present

## 2016-04-27 DIAGNOSIS — I110 Hypertensive heart disease with heart failure: Secondary | ICD-10-CM | POA: Diagnosis not present

## 2016-04-27 DIAGNOSIS — Z9981 Dependence on supplemental oxygen: Secondary | ICD-10-CM | POA: Diagnosis not present

## 2016-04-27 DIAGNOSIS — H409 Unspecified glaucoma: Secondary | ICD-10-CM | POA: Diagnosis not present

## 2016-04-27 DIAGNOSIS — I281 Aneurysm of pulmonary artery: Secondary | ICD-10-CM | POA: Diagnosis not present

## 2016-04-27 DIAGNOSIS — I359 Nonrheumatic aortic valve disorder, unspecified: Secondary | ICD-10-CM | POA: Diagnosis not present

## 2016-04-27 DIAGNOSIS — I5032 Chronic diastolic (congestive) heart failure: Secondary | ICD-10-CM | POA: Diagnosis not present

## 2016-04-27 DIAGNOSIS — M6281 Muscle weakness (generalized): Secondary | ICD-10-CM | POA: Diagnosis not present

## 2016-04-27 DIAGNOSIS — I481 Persistent atrial fibrillation: Secondary | ICD-10-CM | POA: Diagnosis not present

## 2016-04-27 DIAGNOSIS — I272 Other secondary pulmonary hypertension: Secondary | ICD-10-CM | POA: Diagnosis not present

## 2016-05-01 ENCOUNTER — Ambulatory Visit (INDEPENDENT_AMBULATORY_CARE_PROVIDER_SITE_OTHER): Payer: Medicare Other | Admitting: *Deleted

## 2016-05-01 DIAGNOSIS — Z7901 Long term (current) use of anticoagulants: Secondary | ICD-10-CM | POA: Diagnosis not present

## 2016-05-01 DIAGNOSIS — I4891 Unspecified atrial fibrillation: Secondary | ICD-10-CM | POA: Diagnosis not present

## 2016-05-01 DIAGNOSIS — I6350 Cerebral infarction due to unspecified occlusion or stenosis of unspecified cerebral artery: Secondary | ICD-10-CM | POA: Diagnosis not present

## 2016-05-01 LAB — POCT INR: INR: 2.2

## 2016-05-15 ENCOUNTER — Other Ambulatory Visit: Payer: Self-pay | Admitting: Family

## 2016-05-22 ENCOUNTER — Other Ambulatory Visit: Payer: Self-pay | Admitting: Family

## 2016-06-05 ENCOUNTER — Other Ambulatory Visit: Payer: Self-pay | Admitting: Pulmonary Disease

## 2016-06-05 ENCOUNTER — Other Ambulatory Visit: Payer: Self-pay | Admitting: Family

## 2016-06-06 ENCOUNTER — Encounter: Payer: Self-pay | Admitting: Podiatry

## 2016-06-06 ENCOUNTER — Ambulatory Visit (INDEPENDENT_AMBULATORY_CARE_PROVIDER_SITE_OTHER): Payer: Medicare Other | Admitting: Podiatry

## 2016-06-06 VITALS — Ht 71.0 in | Wt 186.0 lb

## 2016-06-06 DIAGNOSIS — B351 Tinea unguium: Secondary | ICD-10-CM

## 2016-06-06 DIAGNOSIS — M79606 Pain in leg, unspecified: Secondary | ICD-10-CM

## 2016-06-06 NOTE — Progress Notes (Signed)
Patient ID: [name] [name], male   DOB: [birth year], 82 y.o.   MRN: [id number]  Complaint:  Visit Type: Patient returns to my office for continued preventative foot care services. Complaint: Patient states" my nails have grown long and thick and become painful to walk and wear shoes"  The patient presents for preventative foot care services. No changes to ROS  Podiatric Exam: Vascular: dorsalis pedis and posterior tibial pulses are palpable bilateral. Capillary return is immediate. Temperature gradient is WNL. Skin turgor WNL  Sensorium: Normal Semmes Weinstein monofilament test. Normal tactile sensation bilaterally. Nail Exam: Pt has thick disfigured discolored nails with subungual debris noted bilateral entire nail hallux through fifth toenails Ulcer Exam: There is no evidence of ulcer or pre-ulcerative changes or infection. Orthopedic Exam: Muscle tone and strength are WNL. No limitations in general ROM. No crepitus or effusions noted. Foot type and digits show no abnormalities. Bony prominences are unremarkable. Skin: No Porokeratosis. No infection or ulcers  Diagnosis:  Onychomycosis, , Pain in right toe, pain in left toes  Treatment & Plan Procedures and Treatment: Consent by patient was obtained for treatment procedures. The patient understood the discussion of treatment and procedures well. All questions were answered thoroughly reviewed. Debridement of mycotic and hypertrophic toenails, 1 through 5 bilateral and clearing of subungual debris. No ulceration, no infection noted.  Return Visit-Office Procedure: Patient instructed to return to the office for a follow up visit 3 months for continued evaluation and treatment.   Gardiner Barefoot DPM

## 2016-06-09 ENCOUNTER — Ambulatory Visit (INDEPENDENT_AMBULATORY_CARE_PROVIDER_SITE_OTHER): Payer: Medicare Other | Admitting: Pharmacist

## 2016-06-09 DIAGNOSIS — I6350 Cerebral infarction due to unspecified occlusion or stenosis of unspecified cerebral artery: Secondary | ICD-10-CM | POA: Diagnosis not present

## 2016-06-09 DIAGNOSIS — I4891 Unspecified atrial fibrillation: Secondary | ICD-10-CM

## 2016-06-09 DIAGNOSIS — Z7901 Long term (current) use of anticoagulants: Secondary | ICD-10-CM | POA: Diagnosis not present

## 2016-06-09 LAB — POCT INR: INR: 1.8

## 2016-06-13 ENCOUNTER — Other Ambulatory Visit: Payer: Self-pay | Admitting: Family

## 2016-07-04 ENCOUNTER — Ambulatory Visit (INDEPENDENT_AMBULATORY_CARE_PROVIDER_SITE_OTHER): Payer: Medicare Other

## 2016-07-04 DIAGNOSIS — I4891 Unspecified atrial fibrillation: Secondary | ICD-10-CM | POA: Diagnosis not present

## 2016-07-04 DIAGNOSIS — Z7901 Long term (current) use of anticoagulants: Secondary | ICD-10-CM

## 2016-07-04 DIAGNOSIS — I6350 Cerebral infarction due to unspecified occlusion or stenosis of unspecified cerebral artery: Secondary | ICD-10-CM | POA: Diagnosis not present

## 2016-07-04 LAB — POCT INR: INR: 1.7

## 2016-07-17 ENCOUNTER — Other Ambulatory Visit: Payer: Self-pay | Admitting: Family

## 2016-07-18 ENCOUNTER — Ambulatory Visit (INDEPENDENT_AMBULATORY_CARE_PROVIDER_SITE_OTHER): Payer: Medicare Other | Admitting: *Deleted

## 2016-07-18 DIAGNOSIS — I6350 Cerebral infarction due to unspecified occlusion or stenosis of unspecified cerebral artery: Secondary | ICD-10-CM

## 2016-07-18 DIAGNOSIS — I4891 Unspecified atrial fibrillation: Secondary | ICD-10-CM | POA: Diagnosis not present

## 2016-07-18 DIAGNOSIS — Z7901 Long term (current) use of anticoagulants: Secondary | ICD-10-CM | POA: Diagnosis not present

## 2016-07-18 LAB — POCT INR: INR: 2

## 2016-08-01 ENCOUNTER — Other Ambulatory Visit: Payer: Self-pay | Admitting: Cardiology

## 2016-08-01 NOTE — Telephone Encounter (Signed)
REFILL 

## 2016-08-07 ENCOUNTER — Other Ambulatory Visit: Payer: Self-pay | Admitting: Family

## 2016-08-13 ENCOUNTER — Other Ambulatory Visit: Payer: Self-pay | Admitting: Cardiology

## 2016-08-14 ENCOUNTER — Ambulatory Visit (INDEPENDENT_AMBULATORY_CARE_PROVIDER_SITE_OTHER): Payer: Medicare Other | Admitting: *Deleted

## 2016-08-14 DIAGNOSIS — I4891 Unspecified atrial fibrillation: Secondary | ICD-10-CM

## 2016-08-14 DIAGNOSIS — I6350 Cerebral infarction due to unspecified occlusion or stenosis of unspecified cerebral artery: Secondary | ICD-10-CM | POA: Diagnosis not present

## 2016-08-14 DIAGNOSIS — Z7901 Long term (current) use of anticoagulants: Secondary | ICD-10-CM | POA: Diagnosis not present

## 2016-08-14 LAB — POCT INR: INR: 2.5

## 2016-08-14 NOTE — Telephone Encounter (Signed)
Rx(s) sent to pharmacy electronically.  

## 2016-08-19 NOTE — Progress Notes (Signed)
HPI The patient presents as followup of his multiple cardiovascular problems. He has been doing OK. He was supposed to wear his oxygen all the time he only wears it at night.  The patient denies any new symptoms such as chest discomfort, neck or arm discomfort. There has been no new shortness of breath, PND or orthopnea. There have been no reported palpitations, presyncope or syncope.  He gets around slowly.    Allergies  Allergen Reactions  . Ibuprofen     REACTION: causes low blood pressure    Current Outpatient Prescriptions  Medication Sig Dispense Refill  . allopurinol (ZYLOPRIM) 100 MG tablet TAKE 1 TABLET(100 MG) BY MOUTH DAILY 30 tablet 0  . atorvastatin (LIPITOR) 20 MG tablet TAKE 1 TABLET BY MOUTH DAILY 30 tablet 8  . bimatoprost (LUMIGAN) 0.03 % ophthalmic solution Place 1 drop into both eyes at bedtime.      . brinzolamide (AZOPT) 1 % ophthalmic suspension Place 1 drop into both eyes 2 (two) times daily.      . Cholecalciferol (VITAMIN D) 2000 UNITS CAPS Take 1 capsule by mouth daily.      . cloNIDine (CATAPRES) 0.2 MG tablet TAKE 1 TABLET BY MOUTH TWICE DAILY 180 tablet 0  . doxycycline (VIBRA-TABS) 100 MG tablet Take 1 tablet (100 mg total) by mouth 2 (two) times daily. 20 tablet 0  . eplerenone (INSPRA) 25 MG tablet TAKE 1 TABLET BY MOUTH EVERY DAY 90 tablet 0  . LUMIGAN 0.01 % SOLN INT 1 GTT IN OU Q NIGHT  11  . meclizine (ANTIVERT) 25 MG tablet Take 1 tablet (25 mg total) by mouth 3 (three) times daily as needed. 50 tablet 5  . metoprolol succinate (TOPROL-XL) 100 MG 24 hr tablet Take 1 tablet (100 mg total) by mouth daily. Take with or immediately following a meal. 30 tablet 5  . mupirocin ointment (BACTROBAN) 2 % Place 1 application into the nose 2 (two) times daily. 22 g 0  . pilocarpine (PILOCAR) 4 % ophthalmic solution 1 drop into left eye every 4 hrs as needed    . potassium chloride SA (K-DUR,KLOR-CON) 20 MEQ tablet TAKE 3 TABLETS BY MOUTH DAILY 270 tablet 0  .  Tamsulosin HCl (FLOMAX) 0.4 MG CAPS Take 0.4 mg by mouth daily.      Marland Kitchen telmisartan (MICARDIS) 80 MG tablet TAKE 1 TABLET BY MOUTH DAILY 90 tablet 0  . torsemide (DEMADEX) 20 MG tablet TAKE 2 TABLETS BY MOUTH IN THE MORNING AND 1 TABLET IN THE EVENING 270 tablet 0  . verapamil (CALAN-SR) 120 MG CR tablet TAKE 1 TABLET BY MOUTH EVERY NIGHT AT BEDTIME 30 tablet 0  . warfarin (COUMADIN) 5 MG tablet Take 1 tablet by mouth daily or as directed by coumadin clinic 90 tablet 1   No current facility-administered medications for this visit.     Past Medical History:  Diagnosis Date  . Aneurysm of pulmonary artery ([place])   . Aortic stenosis   . Atrial fibrillation ([place])   . BPH (benign prostatic hypertrophy)   . Congestive heart failure, unspecified   . CVA (cerebral infarction)   . DJD (degenerative joint disease)   . Generalized osteoarthrosis, unspecified site   . Gout, unspecified   . Pulmonary hypertension   . Pure hypercholesterolemia   . Renal insufficiency   . Unspecified cerebral artery occlusion with cerebral infarction   . Unspecified essential hypertension   . Unspecified hemorrhoids without mention of complication     Past  Surgical History:  Procedure Laterality Date  . APPENDECTOMY    . CATARACT EXTRACTION    . cosmetic eye surgery    . INGUINAL HERNIA REPAIR      ROS:    As stated in the HPI and negative for all other systems.  PHYSICAL EXAM BP 114/80   Pulse 79   Ht 5\' 11"  (1.803 m)   Wt 187 lb (84.8 kg)   BMI 26.08 kg/m  GENERAL:  Well appearing HEENT:  Pupils equal round and reactive, fundi not visualized, oral mucosa unremarkable, upper denture NECK:  No jugular venous distention, waveform within normal limits, carotid upstroke brisk and symmetric, transmitted murmur vs bruit, no thyromegaly LUNGS:  Clear to auscultation bilaterally BACK:  No CVA tenderness CHEST:  Unremarkable HEART:  PMI not displaced or sustained,S1 and S2 within normal limits, no S3, no  clicks, no rubs, apical mid peaking systolic murmur, irregular ABD:  Flat, positive bowel sounds normal in frequency in pitch, no bruits, no rebound, no guarding, no midline pulsatile mass, no hepatomegaly, no splenomegaly EXT:  2 plus pulses throughout, no edema, no cyanosis no clubbing   EKG:  Atrial fibrillation, left axis deviation, rate 79, lateral T wave inversions, no change from previous. 08/21/2016  ASSESSMENT AND PLAN  AORTIC STENOSIS -  The patient has declined followup echo in the past. He wants conservative management. He has no new symptoms. No change in therapy is indicated.  ATRIAL FIBRILLATION -  The patient tolerates this rhythm and rate control and anticoagulation. He has no desire to switch to one of the newer agents.  I will check a CBC and BMET today.  Mr. TAJUAN BREITBACH has a CHA2DS2 - VASc score of 6.  HYPERTENSION -  The blood pressure is at target. No change in medications is indicated. We will continue with therapeutic lifestyle changes (TLC).  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

## 2016-08-21 ENCOUNTER — Encounter: Payer: Self-pay | Admitting: Cardiology

## 2016-08-21 ENCOUNTER — Ambulatory Visit (INDEPENDENT_AMBULATORY_CARE_PROVIDER_SITE_OTHER): Payer: Medicare Other | Admitting: Cardiology

## 2016-08-21 VITALS — BP 114/80 | HR 79 | Ht 71.0 in | Wt 187.0 lb

## 2016-08-21 DIAGNOSIS — I272 Pulmonary hypertension, unspecified: Secondary | ICD-10-CM

## 2016-08-21 DIAGNOSIS — I482 Chronic atrial fibrillation: Secondary | ICD-10-CM | POA: Diagnosis not present

## 2016-08-21 DIAGNOSIS — I4821 Permanent atrial fibrillation: Secondary | ICD-10-CM

## 2016-08-21 NOTE — Patient Instructions (Signed)

## 2016-08-28 ENCOUNTER — Other Ambulatory Visit: Payer: Self-pay | Admitting: Cardiology

## 2016-08-28 ENCOUNTER — Other Ambulatory Visit: Payer: Self-pay | Admitting: Family

## 2016-08-30 ENCOUNTER — Other Ambulatory Visit: Payer: Self-pay | Admitting: Pulmonary Disease

## 2016-09-05 ENCOUNTER — Other Ambulatory Visit: Payer: Self-pay | Admitting: Pulmonary Disease

## 2016-09-05 ENCOUNTER — Other Ambulatory Visit: Payer: Self-pay | Admitting: Family

## 2016-09-06 NOTE — Telephone Encounter (Signed)
SN no longer PCP, pt is now under the care of Dr Elna Breslow.  Rx denied and deferred to PCP

## 2016-09-11 ENCOUNTER — Other Ambulatory Visit: Payer: Self-pay | Admitting: Cardiology

## 2016-09-11 ENCOUNTER — Other Ambulatory Visit: Payer: Self-pay | Admitting: Pulmonary Disease

## 2016-09-11 ENCOUNTER — Other Ambulatory Visit: Payer: Self-pay | Admitting: Family

## 2016-09-12 ENCOUNTER — Encounter: Payer: Self-pay | Admitting: Sports Medicine

## 2016-09-12 ENCOUNTER — Ambulatory Visit (INDEPENDENT_AMBULATORY_CARE_PROVIDER_SITE_OTHER): Payer: Medicare Other

## 2016-09-12 ENCOUNTER — Ambulatory Visit (INDEPENDENT_AMBULATORY_CARE_PROVIDER_SITE_OTHER): Payer: Medicare Other | Admitting: Sports Medicine

## 2016-09-12 DIAGNOSIS — I739 Peripheral vascular disease, unspecified: Secondary | ICD-10-CM

## 2016-09-12 DIAGNOSIS — M79606 Pain in leg, unspecified: Secondary | ICD-10-CM | POA: Diagnosis not present

## 2016-09-12 DIAGNOSIS — I6350 Cerebral infarction due to unspecified occlusion or stenosis of unspecified cerebral artery: Secondary | ICD-10-CM

## 2016-09-12 DIAGNOSIS — B351 Tinea unguium: Secondary | ICD-10-CM | POA: Diagnosis not present

## 2016-09-12 DIAGNOSIS — Z7901 Long term (current) use of anticoagulants: Secondary | ICD-10-CM

## 2016-09-12 DIAGNOSIS — I4891 Unspecified atrial fibrillation: Secondary | ICD-10-CM | POA: Diagnosis not present

## 2016-09-12 LAB — POCT INR: INR: 2.7

## 2016-09-12 NOTE — Progress Notes (Signed)
Subjective: [name] [name] is a 83 y.o. male patient seen today in office with complaint of painful thickened and elongated toenails; unable to trim. Patient denies any changes with medical history since last visit; on coumadin. Patient is assisted by daughter. Patient has no other pedal complaints at this time.   Patient Active Problem List   Diagnosis Date Noted  . Dyspnea 11/16/2015  . Lipoma of neck 06/15/2015  . Back pain 06/15/2015  . Lightheadedness 03/04/2015  . Glaucoma 01/14/2013  . Long term current use of anticoagulant 09/26/2010  . CHRONIC CORONARY ISCHEMIA 02/01/2010  . GYNECOMASTIA 11/15/2009  . DIZZINESS 07/13/2009  . Pulmonary hypertension ([place]) 09/25/2007  . GOUT 07/25/2007  . ANEURYSM OF PULMONARY ARTERY 07/25/2007  . Aortic valve disorder 07/25/2007  . ATRIAL FIBRILLATION 07/25/2007  . HEMORRHOIDS 07/25/2007  . BENIGN PROSTATIC HYPERTROPHY, HX OF 07/25/2007  . INGUINAL HERNIA 07/24/2007  . HYPERCHOLESTEROLEMIA 06/01/2007  . Essential hypertension 06/01/2007  . Congestive heart failure ([place]) 06/01/2007  . CVA 06/01/2007  . Disorder resulting from impaired renal function 06/01/2007  . Osteoarthritis 06/01/2007    Current Outpatient Prescriptions on File Prior to Visit  Medication Sig Dispense Refill  . allopurinol (ZYLOPRIM) 100 MG tablet TAKE 1 TABLET(100 MG) BY MOUTH DAILY 30 tablet 0  . atorvastatin (LIPITOR) 20 MG tablet TAKE 1 TABLET BY MOUTH DAILY 30 tablet 0  . bimatoprost (LUMIGAN) 0.03 % ophthalmic solution Place 1 drop into both eyes at bedtime.      . brinzolamide (AZOPT) 1 % ophthalmic suspension Place 1 drop into both eyes 2 (two) times daily.      . Cholecalciferol (VITAMIN D) 2000 UNITS CAPS Take 1 capsule by mouth daily.      . cloNIDine (CATAPRES) 0.2 MG tablet TAKE 1 TABLET BY MOUTH TWICE DAILY 180 tablet 0  . doxycycline (VIBRA-TABS) 100 MG tablet Take 1 tablet (100 mg total) by mouth 2 (two) times daily. 20 tablet 0  . eplerenone  (INSPRA) 25 MG tablet TAKE 1 TABLET BY MOUTH EVERY DAY 90 tablet 0  . LUMIGAN 0.01 % SOLN INT 1 GTT IN OU Q NIGHT  11  . meclizine (ANTIVERT) 25 MG tablet Take 1 tablet (25 mg total) by mouth 3 (three) times daily as needed. 50 tablet 5  . metoprolol succinate (TOPROL-XL) 100 MG 24 hr tablet Take 1 tablet (100 mg total) by mouth daily. Take with or immediately following a meal. 30 tablet 5  . mupirocin ointment (BACTROBAN) 2 % Place 1 application into the nose 2 (two) times daily. 22 g 0  . pilocarpine (PILOCAR) 4 % ophthalmic solution 1 drop into left eye every 4 hrs as needed    . potassium chloride SA (K-DUR,KLOR-CON) 20 MEQ tablet TAKE 3 TABLETS BY MOUTH DAILY 270 tablet 0  . Tamsulosin HCl (FLOMAX) 0.4 MG CAPS Take 0.4 mg by mouth daily.      Marland Kitchen telmisartan (MICARDIS) 80 MG tablet TAKE 1 TABLET BY MOUTH DAILY 90 tablet 0  . torsemide (DEMADEX) 20 MG tablet TAKE 2 TABLETS BY MOUTH IN THE MORNING AND 1 TABLET IN THE EVENING 270 tablet 0  . torsemide (DEMADEX) 20 MG tablet TAKE 2 TABLETS BY MOUTH EVERY MORNING AND TAKE 1 TABLET BY MOUTH EVERY EVENING 180 tablet 0  . verapamil (CALAN-SR) 120 MG CR tablet TAKE 1 TABLET BY MOUTH EVERY NIGHT AT BEDTIME 30 tablet 0  . warfarin (COUMADIN) 5 MG tablet Take 1/2 to 1 tablet daily or AS directed by coumadin clinic  60 tablet 1   No current facility-administered medications on file prior to visit.     Allergies  Allergen Reactions  . Ibuprofen     REACTION: causes low blood pressure    Objective: Physical Exam  General: Well developed, nourished, no acute distress, awake, alert and oriented x 3  Vascular: Dorsalis pedis artery 1/4 bilateral, Posterior tibial artery 1/4 bilateral, skin temperature warm to warm proximal to distal bilateral lower extremities, mild varicosities, trace edema bilateral, decreased pedal hair present bilateral.  Neurological: Gross sensation present via light touch bilateral.   Dermatological: Skin is warm, dry, and  supple bilateral, Nails 1-10 are tender, long, thick, and discolored with mild subungal debris, no webspace macerations present bilateral, no open lesions present bilateral, no callus/corns/hyperkeratotic tissue present bilateral. No signs of infection bilateral.  Musculoskeletal: Mild asymptomatic hammertoe boney deformities noted bilateral. Muscular strength within normal limits without painon range of motion. No pain with calf compression bilateral.  Assessment and Plan:  Problem List Items Addressed This Visit    None    Visit Diagnoses    Onychomycosis    -  Primary   Pain of lower extremity, unspecified laterality       PVD (peripheral vascular disease) ([place])         -Examined patient.  -Discussed treatment options for painful mycotic nails. -Mechanically debrided and reduced mycotic nails with sterile nail nipper and dremel nail file without incident. -Recommend elevation to assist with edema control  -Patient to return in 3 months for follow up evaluation or sooner if symptoms worsen.  Landis Martins, DPM

## 2016-09-13 ENCOUNTER — Other Ambulatory Visit: Payer: Self-pay | Admitting: Cardiology

## 2016-10-02 ENCOUNTER — Other Ambulatory Visit: Payer: Self-pay | Admitting: Cardiology

## 2016-10-02 ENCOUNTER — Other Ambulatory Visit: Payer: Self-pay | Admitting: Family

## 2016-10-09 ENCOUNTER — Other Ambulatory Visit: Payer: Self-pay | Admitting: Family

## 2016-10-09 ENCOUNTER — Other Ambulatory Visit: Payer: Self-pay | Admitting: Pulmonary Disease

## 2016-10-09 ENCOUNTER — Other Ambulatory Visit: Payer: Self-pay | Admitting: Cardiology

## 2016-10-09 NOTE — Telephone Encounter (Signed)
Rx has been sent to the pharmacy electronically. ° °

## 2016-10-17 ENCOUNTER — Ambulatory Visit: Payer: Medicare Other | Admitting: Pulmonary Disease

## 2016-10-18 ENCOUNTER — Other Ambulatory Visit: Payer: Self-pay | Admitting: Cardiology

## 2016-10-21 ENCOUNTER — Other Ambulatory Visit: Payer: Self-pay | Admitting: Cardiology

## 2016-10-24 ENCOUNTER — Ambulatory Visit (INDEPENDENT_AMBULATORY_CARE_PROVIDER_SITE_OTHER): Payer: Medicare Other | Admitting: Pharmacist

## 2016-10-24 DIAGNOSIS — I6350 Cerebral infarction due to unspecified occlusion or stenosis of unspecified cerebral artery: Secondary | ICD-10-CM | POA: Diagnosis not present

## 2016-10-24 DIAGNOSIS — I4891 Unspecified atrial fibrillation: Secondary | ICD-10-CM

## 2016-10-24 DIAGNOSIS — Z7901 Long term (current) use of anticoagulants: Secondary | ICD-10-CM | POA: Diagnosis not present

## 2016-10-24 LAB — POCT INR: INR: 3.2

## 2016-10-24 NOTE — Telephone Encounter (Signed)
Rx(s) sent to pharmacy electronically.  

## 2016-10-25 ENCOUNTER — Other Ambulatory Visit: Payer: Self-pay

## 2016-10-26 ENCOUNTER — Other Ambulatory Visit: Payer: Self-pay | Admitting: Family

## 2016-10-27 ENCOUNTER — Other Ambulatory Visit: Payer: Self-pay | Admitting: *Deleted

## 2016-10-27 MED ORDER — TELMISARTAN 80 MG PO TABS: 80.0000 mg | ORAL_TABLET | Freq: Every day | ORAL | 3 refills | Status: DC

## 2016-10-27 NOTE — Telephone Encounter (Signed)
REFILL 

## 2016-10-30 ENCOUNTER — Other Ambulatory Visit: Payer: Self-pay | Admitting: Pulmonary Disease

## 2016-11-03 ENCOUNTER — Other Ambulatory Visit: Payer: Self-pay | Admitting: Pulmonary Disease

## 2016-11-09 ENCOUNTER — Other Ambulatory Visit: Payer: Self-pay | Admitting: Pulmonary Disease

## 2016-11-09 MED ORDER — ALLOPURINOL 100 MG PO TABS: ORAL_TABLET | ORAL | 2 refills | Status: DC

## 2016-11-10 ENCOUNTER — Encounter: Payer: Self-pay | Admitting: Family

## 2016-11-10 ENCOUNTER — Ambulatory Visit (INDEPENDENT_AMBULATORY_CARE_PROVIDER_SITE_OTHER): Payer: Medicare Other | Admitting: Family

## 2016-11-10 ENCOUNTER — Other Ambulatory Visit (INDEPENDENT_AMBULATORY_CARE_PROVIDER_SITE_OTHER): Payer: Medicare Other

## 2016-11-10 VITALS — BP 122/80 | HR 56 | Temp 97.7°F | Resp 16 | Ht 71.0 in | Wt 184.0 lb

## 2016-11-10 DIAGNOSIS — Z125 Encounter for screening for malignant neoplasm of prostate: Secondary | ICD-10-CM

## 2016-11-10 DIAGNOSIS — I10 Essential (primary) hypertension: Secondary | ICD-10-CM | POA: Diagnosis not present

## 2016-11-10 DIAGNOSIS — I482 Chronic atrial fibrillation: Secondary | ICD-10-CM

## 2016-11-10 DIAGNOSIS — E7800 Pure hypercholesterolemia, unspecified: Secondary | ICD-10-CM

## 2016-11-10 DIAGNOSIS — I5032 Chronic diastolic (congestive) heart failure: Secondary | ICD-10-CM | POA: Diagnosis not present

## 2016-11-10 DIAGNOSIS — Z0000 Encounter for general adult medical examination without abnormal findings: Secondary | ICD-10-CM | POA: Diagnosis not present

## 2016-11-10 DIAGNOSIS — I4821 Permanent atrial fibrillation: Secondary | ICD-10-CM

## 2016-11-10 LAB — COMPREHENSIVE METABOLIC PANEL
ALK PHOS: 127 U/L — AB (ref 39–117)
ALT: 8 U/L (ref 0–53)
AST: 13 U/L (ref 0–37)
Albumin: 4.1 g/dL (ref 3.5–5.2)
BILIRUBIN TOTAL: 0.7 mg/dL (ref 0.2–1.2)
BUN: 38 mg/dL — ABNORMAL HIGH (ref 6–23)
CALCIUM: 9.4 mg/dL (ref 8.4–10.5)
CHLORIDE: 108 meq/L (ref 96–112)
CO2: 30 meq/L (ref 19–32)
Creatinine, Ser: 1.73 mg/dL — ABNORMAL HIGH (ref 0.40–1.50)
GFR: 48.04 mL/min — AB (ref >60.00)
GLUCOSE: 116 mg/dL — AB (ref 70–99)
POTASSIUM: 4.2 meq/L (ref 3.5–5.1)
Sodium: 143 mEq/L (ref 135–145)
Total Protein: 7.1 g/dL (ref 6.0–8.3)

## 2016-11-10 LAB — LIPID PANEL
CHOLESTEROL: 126 mg/dL (ref 0–200)
HDL: 45.7 mg/dL (ref >39.00)
LDL Cholesterol: 62 mg/dL (ref 0–99)
NONHDL: 79.97
Total CHOL/HDL Ratio: 3
Triglycerides: 89 mg/dL (ref 0.0–149.0)
VLDL: 17.8 mg/dL (ref 0.0–40.0)

## 2016-11-10 LAB — CBC
HEMATOCRIT: 43.1 % (ref 39.0–52.0)
HEMOGLOBIN: 13.9 g/dL (ref 13.0–17.0)
MCHC: 32.3 g/dL (ref 30.0–36.0)
MCV: 101.8 fl — AB (ref 78.0–100.0)
PLATELETS: 203 10*3/uL (ref 150.0–400.0)
RBC: 4.24 Mil/uL (ref 4.22–5.81)
RDW: 16.5 % — AB (ref 11.5–15.5)
WBC: 6.7 10*3/uL (ref 4.0–10.5)

## 2016-11-10 LAB — PSA, MEDICARE: PSA: 1.08 ng/ml (ref 0.10–4.00)

## 2016-11-10 LAB — HEMOGLOBIN A1C: Hgb A1c MFr Bld: 6.6 % — ABNORMAL HIGH (ref 4.6–6.5)

## 2016-11-10 NOTE — Assessment & Plan Note (Signed)
Blood pressures below goal 140/90 with current medication regimen and no adverse side effects. Encouraged to continue iron blood pressure at home and follow low-sodium diet. Continue to monitor.

## 2016-11-10 NOTE — Progress Notes (Signed)
Subjective:    Patient ID: [name] [name], male    DOB: 08-09-1925, 84 y.o.   MRN: [id number]  Chief Complaint  Patient presents with  . Medicare Wellness    HPI:  [name] [name] is a 83 y.o. male who presents today for a Medicare Annual Wellness/Physical exam. Daughter is present for today's visit.    1) Health Maintenance -   Diet - Averaging about 2 meals per day consisting of a regular diet; Caffeine intake 1-2 cups every once in a while.   Exercise - Occasional exercise  2) Preventative Exams / Immunizations:  Dental -- Due for exam  Vision -- Up to date   Health Maintenance  Topic Date Due  . INFLUENZA VACCINE  03/07/2017  . TETANUS/TDAP  11/01/2021  . PNA vac Low Risk Adult  Completed     Immunization History  Administered Date(s) Administered  . H1N1 07/08/2008  . Influenza Split 06/16/2011, 05/08/2012  . Influenza Whole 05/20/2008, 06/08/2009, 07/11/2010  . Influenza, High Dose Seasonal PF 06/15/2015  . Influenza,inj,Quad PF,36+ Mos 05/05/2013, 04/29/2014, 04/17/2016  . Pneumococcal Conjugate-13 05/19/2014  . Pneumococcal Polysaccharide-23 07/19/2009  . Tdap 11/02/2011    RISK FACTORS  Tobacco History  Smoking Status  . Former Smoker  . Years: 3.00  . Types: Cigarettes, Cigars  . Quit date: 12/05/2003  Smokeless Tobacco  . Never Used    Comment: does not smoke cigars anymore     Cardiac risk factors: advanced age (older than 26 for men, 57 for women), dyslipidemia, hypertension and male gender.  Depression Screen  Depression screen [facility] 2/9 11/10/2016  Decreased Interest 0  Down, Depressed, Hopeless 0  PHQ - 2 Score 0  Some recent data might be hidden     Activities of Daily Living In your present state of health, do you have any difficulty performing the following activities?:  Driving? Not currently driving  Managing money?  Managed by daughter  Feeding yourself? No Getting from bed to chair? No Climbing a flight of  stairs? No Preparing food and eating?: No Bathing or showering? No Getting dressed: No Getting to the toilet? No Using the toilet: No Moving around from place to place: No In the past year have you fallen or had a near fall?:No   Home Safety Has smoke detector and wears seat belts. No excess sun exposure. Are there smokers in your home (other than you)?  No Do you feel safe at home?  Yes  Hearing Difficulties: No Do you often ask people to speak up or repeat themselves? No Do you experience ringing or noises in your ears? No  Do you have difficulty understanding soft or whispered voices? No    Cognitive Testing  Alert? Yes   Normal Appearance? Yes  Oriented to person? Yes  Place? Yes   Time? Yes  Recall of three objects?  Yes  Can perform simple calculations? Yes  Displays appropriate judgment? Yes  Can read the correct time from a watch face? Yes  Do you feel that you have a problem with memory? No  Do you often misplace items? No   Advanced Directives have been discussed with the patient? Yes   Current Physicians/Providers and Suppliers  1. Terri Piedra, FNP - Internal Medicine 2. Landis Martins, DPM - Podiatry 3. Minus Breeding, MD - Cardiology 4. Teressa Lower, MD - Pulmonology 5. Barbaraann Rondo, MD - Urology 6. Dr. Sharlene Dory, MD - Opthalmology   Indicate any recent Medical Services you may have  received from other than [facility] providers in the past year (date may be approximate).  All answers were reviewed with the patient and necessary referrals were made:  Mauricio Po, [place]   11/10/2016    Allergies  Allergen Reactions  . Ibuprofen     REACTION: causes low blood pressure     Outpatient Medications Prior to Visit  Medication Sig Dispense Refill  . allopurinol (ZYLOPRIM) 100 MG tablet TAKE 1 TABLET(100 MG) BY MOUTH DAILY 30 tablet 2  . atorvastatin (LIPITOR) 20 MG tablet TAKE 1 TABLET BY MOUTH DAILY 30 tablet 11  . brinzolamide (AZOPT) 1 % ophthalmic  suspension Place 1 drop into both eyes 2 (two) times daily.      . Cholecalciferol (VITAMIN D) 2000 UNITS CAPS Take 1 capsule by mouth daily.      . cloNIDine (CATAPRES) 0.2 MG tablet TAKE 1 TABLET BY MOUTH TWICE DAILY 180 tablet 0  . doxycycline (VIBRA-TABS) 100 MG tablet Take 1 tablet (100 mg total) by mouth 2 (two) times daily. 20 tablet 0  . eplerenone (INSPRA) 25 MG tablet TAKE 1 TABLET BY MOUTH EVERY DAY 90 tablet 3  . LUMIGAN 0.01 % SOLN INT 1 GTT IN OU Q NIGHT  11  . meclizine (ANTIVERT) 25 MG tablet Take 1 tablet (25 mg total) by mouth 3 (three) times daily as needed. 50 tablet 5  . metoprolol succinate (TOPROL-XL) 100 MG 24 hr tablet TAKE 1 TABLET BY MOUTH DAILY WITH OR IMMEDIATELY FOLLOWING A MEAL 90 tablet 3  . pilocarpine (PILOCAR) 4 % ophthalmic solution 1 drop into left eye every 4 hrs as needed    . potassium chloride SA (K-DUR,KLOR-CON) 20 MEQ tablet TAKE 3 TABLETS BY MOUTH DAILY 270 tablet 3  . Tamsulosin HCl (FLOMAX) 0.4 MG CAPS Take 0.4 mg by mouth daily.      Marland Kitchen telmisartan (MICARDIS) 80 MG tablet Take 1 tablet (80 mg total) by mouth daily. 90 tablet 3  . torsemide (DEMADEX) 20 MG tablet TAKE 2 TABLETS BY MOUTH IN THE MORNING AND 1 TABLET IN THE EVENING 270 tablet 0  . torsemide (DEMADEX) 20 MG tablet TAKE 2 TABLETS BY MOUTH EVERY MORNING AND TAKE 1 TABLET BY MOUTH EVERY EVENING 180 tablet 0  . verapamil (CALAN-SR) 120 MG CR tablet TAKE 1 TABLET BY MOUTH EVERY NIGHT AT BEDTIME 30 tablet 11  . warfarin (COUMADIN) 5 MG tablet Take 1/2 to 1 tablet daily or AS directed by coumadin clinic 60 tablet 1  . bimatoprost (LUMIGAN) 0.03 % ophthalmic solution Place 1 drop into both eyes at bedtime.      . mupirocin ointment (BACTROBAN) 2 % Place 1 application into the nose 2 (two) times daily. 22 g 0   No facility-administered medications prior to visit.      Past Medical History:  Diagnosis Date  . 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 Aortic stenosis   . Atrial fibrillation ([place])    . BPH (benign prostatic hypertrophy)   . Congestive heart failure, unspecified   . CVA (cerebral infarction)   . DJD (degenerative joint disease)   . Generalized osteoarthrosis, unspecified site   . Gout, unspecified   . Pulmonary hypertension   . Pure hypercholesterolemia   . Renal insufficiency   . Unspecified cerebral artery occlusion with cerebral infarction   . Unspecified essential hypertension   . Unspecified hemorrhoids without mention of complication      Past Surgical History:  Procedure Laterality Date  . APPENDECTOMY    .  CATARACT EXTRACTION    . cosmetic eye surgery    . INGUINAL HERNIA REPAIR       History reviewed. No pertinent family history.   Social History   Social History  . Marital status: Married    Spouse name: N/A  . Number of children: 5  . Years of education: 10   Occupational History  . Retired    Social History Main Topics  . Smoking status: Former Smoker    Years: 3.00    Types: Cigarettes, Cigars    Quit date: 12/05/2003  . Smokeless tobacco: Never Used     Comment: does not smoke cigars anymore  . Alcohol use Not on file     Comment: Very rarely  . Drug use: No  . Sexual activity: Not on file   Other Topics Concern  . Not on file   Social History Narrative   Fun: Stay home; sitting on the porch   Denies religious beliefs effecting health care     Review of Systems  Constitutional: Denies fever, chills, fatigue, or significant weight gain/loss. HENT: Head: Denies headache or neck pain Ears: Denies changes in hearing, ringing in ears, earache, drainage Nose: Denies discharge, stuffiness, itching, nosebleed, sinus pain Throat: Denies sore throat, hoarseness, dry mouth, sores, thrush Eyes: Denies loss/changes in vision, pain, redness, blurry/double vision, flashing lights Cardiovascular: Denies chest pain/discomfort, tightness, palpitations, shortness of breath with activity, difficulty lying down, swelling, sudden  awakening with shortness of breath Respiratory: Denies shortness of breath, cough, sputum production, wheezing Gastrointestinal: Denies dysphasia, heartburn, change in appetite, nausea, change in bowel habits, rectal bleeding, constipation, diarrhea, yellow skin or eyes Genitourinary: Denies frequency, urgency, burning/pain, blood in urine, incontinence, change in urinary strength. Musculoskeletal: Denies muscle/joint pain, stiffness, back pain, redness or swelling of joints, trauma Skin: Denies rashes, lumps, itching, dryness, color changes, or hair/nail changes Neurological: Denies dizziness, fainting, seizures, weakness, numbness, tingling, tremor Psychiatric - Denies nervousness, stress, depression or memory loss Endocrine: Denies heat or cold intolerance, sweating, frequent urination, excessive thirst, changes in appetite Hematologic: Denies ease of bruising or bleeding    Objective:     BP 122/80 (BP Location: Left Arm, Patient Position: Sitting, Cuff Size: Normal)   Pulse (!) 56   Temp 97.7 F (36.5 C) (Oral)   Resp 16   Ht 5\' 11"  (1.803 m)   Wt 184 lb (83.5 kg)   SpO2 91%   BMI 25.66 kg/m  Nursing note and vital signs reviewed.  Physical Exam  Constitutional: He is oriented to person, place, and time. He appears well-developed and well-nourished.  HENT:  Head: Normocephalic.  Right Ear: Hearing, tympanic membrane, external ear and ear canal normal.  Left Ear: Hearing, tympanic membrane, external ear and ear canal normal.  Nose: Nose normal.  Mouth/Throat: Uvula is midline, oropharynx is clear and moist and mucous membranes are normal.  Eyes: Conjunctivae and EOM are normal. Pupils are equal, round, and reactive to light.  Neck: Neck supple. No JVD present. No tracheal deviation present. No thyromegaly present.  Cardiovascular: Normal rate, regular rhythm, normal heart sounds and intact distal pulses.   Pulmonary/Chest: Effort normal and breath sounds normal.  Abdominal:  Soft. Bowel sounds are normal. He exhibits no distension and no mass. There is no tenderness. There is no rebound and no guarding.  Musculoskeletal: Normal range of motion. He exhibits no edema or tenderness.  Lymphadenopathy:    He has no cervical adenopathy.  Neurological: He is alert and oriented to person,  place, and time. He has normal reflexes. No cranial nerve deficit. He exhibits normal muscle tone. Coordination normal.  Skin: Skin is warm and dry.  Psychiatric: He has a normal mood and affect. His behavior is normal. Judgment and thought content normal.       Assessment & Plan:   During the course of the visit the patient was educated and counseled about appropriate screening and preventive services including:    Pneumococcal vaccine   Influenza vaccine  Prostate cancer screening  Diabetes screening  Glaucoma screening  Nutrition counseling   Diet review for nutrition referral? Yes ____  Not Indicated _X___   Patient Instructions (the written plan) was given to the patient.  Medicare Attestation I have personally reviewed: The patient's medical and social history Their use of alcohol, tobacco or illicit drugs Their current medications and supplements The patient's functional ability including ADLs,fall risks, home safety risks, cognitive, and hearing and visual impairment Diet and physical activities Evidence for depression or mood disorders  The patient's weight, height, BMI,  have been recorded in the chart.  I have made referrals, counseling, and provided education to the patient based on review of the above and I have provided the patient with a written personalized care plan for preventive services.     Problem List Items Addressed This Visit      Cardiovascular and Mediastinum   Congestive heart failure (HCC) (Chronic)    Appears euvolemic with no current symptoms of exacerbation. Most recent echocardiogram shows ejection fraction of 60-65% with no  abnormal wall motion. Continue current dosage of torsemide. Maintained on metoprolol for reduction of risk of sudden cardiac death. Maintained on telmisartan. Continue to monitor.       Essential hypertension    Blood pressures below goal 140/90 with current medication regimen and no adverse side effects. Encouraged to continue iron blood pressure at home and follow low-sodium diet. Continue to monitor.      Relevant Orders   CBC (Completed)   Comprehensive metabolic panel (Completed)   ATRIAL FIBRILLATION    Atrial fibrillation noted on exam and rate controlled with no evidence of RVR. Anticoagulated with warfarin with no nuisance bleeding. Continue to monitor.         Other   HYPERCHOLESTEROLEMIA    Currently maintained on atorvastatin. Obtain lipid profile. Continue current dosage of atorvastatin pending lipid profile results.      Relevant Orders   Lipid panel (Completed)   Medicare annual wellness visit, subsequent - Primary    Reviewed and updated patient's medical, surgical, family and social history. Medications and allergies were also reviewed. Basic screenings for depression, activities of daily living, hearing, cognition and safety were performed. Provider list was updated and health plan was provided to the patient.       Routine adult health maintenance    1) Anticipatory Guidance: Discussed importance of wearing a seatbelt while driving and not texting while driving; changing batteries in smoke detector at least once annually; wearing suntan lotion when outside; eating a balanced and moderate diet; getting physical activity at least 30 minutes per day.  2) Immunizations / Screenings / Labs:  All immunizations are up-to-date per recommendations. Obtain PSA for prostate cancer screening. Due for a dental exam encouraged to be completed independently. All other screenings are up-to-date per recommendations. Obtain CBC, CMET, and lipid profile.    Overall well exam with  multiple comorbidities contributing to risks for cardiovascular disease. Chronic conditions appear adequately managed with no exacerbations. Encouraged  continued nutritional intake that is moderate, balance, and varied. Recommend increasing physical activity to 1-2 times weekly with step goal between 7 and 10,000 steps daily as tolerated. Continue other healthy lifestyle behaviors and choices. Follow-up prevention exam in 1 year. Follow-up office visit for chronic conditions.      Relevant Orders   Hemoglobin A1c (Completed)    Other Visit Diagnoses    Screening for prostate cancer       Relevant Orders   PSA, Medicare (Completed)       I have discontinued Mr. Vasco bimatoprost and mupirocin ointment. I am also having him maintain his tamsulosin, Vitamin D, brinzolamide, pilocarpine, meclizine, LUMIGAN, doxycycline, torsemide, warfarin, cloNIDine, metoprolol succinate, atorvastatin, verapamil, potassium chloride SA, eplerenone, telmisartan, torsemide, and allopurinol.   Follow-up: Return in about 6 months (around 05/12/2017), or if symptoms worsen or fail to improve.   Mauricio Po, FNP

## 2016-11-10 NOTE — Assessment & Plan Note (Signed)
Reviewed and updated patient's medical, surgical, family and social history. Medications and allergies were also reviewed. Basic screenings for depression, activities of daily living, hearing, cognition and safety were performed. Provider list was updated and health plan was provided to the patient.  

## 2016-11-10 NOTE — Assessment & Plan Note (Signed)
Appears euvolemic with no current symptoms of exacerbation. Most recent echocardiogram shows ejection fraction of 60-65% with no abnormal wall motion. Continue current dosage of torsemide. Maintained on metoprolol for reduction of risk of sudden cardiac death. Maintained on telmisartan. Continue to monitor.

## 2016-11-10 NOTE — Assessment & Plan Note (Signed)
1) Anticipatory Guidance: Discussed importance of wearing a seatbelt while driving and not texting while driving; changing batteries in smoke detector at least once annually; wearing suntan lotion when outside; eating a balanced and moderate diet; getting physical activity at least 30 minutes per day.  2) Immunizations / Screenings / Labs:  All immunizations are up-to-date per recommendations. Obtain PSA for prostate cancer screening. Due for a dental exam encouraged to be completed independently. All other screenings are up-to-date per recommendations. Obtain CBC, CMET, and lipid profile.    Overall well exam with multiple comorbidities contributing to risks for cardiovascular disease. Chronic conditions appear adequately managed with no exacerbations. Encouraged continued nutritional intake that is moderate, balance, and varied. Recommend increasing physical activity to 1-2 times weekly with step goal between 7 and 10,000 steps daily as tolerated. Continue other healthy lifestyle behaviors and choices. Follow-up prevention exam in 1 year. Follow-up office visit for chronic conditions.

## 2016-11-10 NOTE — Patient Instructions (Addendum)
Thank you for choosing [facility].  SUMMARY AND INSTRUCTIONS:  Medication:  Keep taking medication as prescribed.  Labs:  Please stop by the lab on the lower level of the building for your blood work. Your results will be released to [place] (or called to you) after review, usually within 72 hours after test completion. If any changes need to be made, you will be notified at that same time.  1.) The lab is open from 7:30am to 5:30 pm Monday-Friday 2.) No appointment is necessary 3.) Fasting (if needed) is 6-8 hours after food and drink; black coffee and water are okay   Follow up:  If your symptoms worsen or fail to improve, please contact our office for further instruction, or in case of emergency go directly to the emergency room at the closest medical facility.   Health Maintenance  Topic Date Due  . INFLUENZA VACCINE  03/07/2017  . TETANUS/TDAP  11/01/2021  . PNA vac Low Risk Adult  Completed    Health Maintenance, Male A healthy lifestyle and preventive care is important for your health and wellness. Ask your health care provider about what schedule of regular examinations is right for you. What should I know about weight and diet?  Eat a Healthy Diet  Eat plenty of vegetables, fruits, whole grains, low-fat dairy products, and lean protein.  Do not eat a lot of foods high in solid fats, added sugars, or salt. Maintain a Healthy Weight  Regular exercise can help you achieve or maintain a healthy weight. You should:  Do at least 150 minutes of exercise each week. The exercise should increase your heart rate and make you sweat (moderate-intensity exercise).  Do strength-training exercises at least twice a week. Watch Your Levels of Cholesterol and Blood Lipids  Have your blood tested for lipids and cholesterol every 5 years starting at 81 years of age. If you are at high risk for heart disease, you should start having your blood tested when you are 81 years old.  You may need to have your cholesterol levels checked more often if:  Your lipid or cholesterol levels are high.  You are older than 81 years of age.  You are at high risk for heart disease. What should I know about cancer screening? Many types of cancers can be detected early and may often be prevented. Lung Cancer  You should be screened every year for lung cancer if:  You are a current smoker who has smoked for at least 30 years.  You are a former smoker who has quit within the past 15 years.  Talk to your health care provider about your screening options, when you should start screening, and how often you should be screened. Colorectal Cancer  Routine colorectal cancer screening usually begins at 81 years of age and should be repeated every 5-10 years until you are 81 years old. You may need to be screened more often if early forms of precancerous polyps or small growths are found. Your health care provider may recommend screening at an earlier age if you have risk factors for colon cancer.  Your health care provider may recommend using home test kits to check for hidden blood in the stool.  A small camera at the end of a tube can be used to examine your colon (sigmoidoscopy or colonoscopy). This checks for the earliest forms of colorectal cancer. Prostate and Testicular Cancer  Depending on your age and overall health, your health care provider may do certain  tests to screen for prostate and testicular cancer.  Talk to your health care provider about any symptoms or concerns you have about testicular or prostate cancer. Skin Cancer  Check your skin from head to toe regularly.  Tell your health care provider about any new moles or changes in moles, especially if:  There is a change in a mole's size, shape, or color.  You have a mole that is larger than a pencil eraser.  Always use sunscreen. Apply sunscreen liberally and repeat throughout the day.  Protect yourself by  wearing long sleeves, pants, a wide-brimmed hat, and sunglasses when outside. What should I know about heart disease, diabetes, and high blood pressure?  If you are 30-25 years of age, have your blood pressure checked every 3-5 years. If you are 69 years of age or older, have your blood pressure checked every year. You should have your blood pressure measured twice-once when you are at a hospital or clinic, and once when you are not at a hospital or clinic. Record the average of the two measurements. To check your blood pressure when you are not at a hospital or clinic, you can use:  An automated blood pressure machine at a pharmacy.  A home blood pressure monitor.  Talk to your health care provider about your target blood pressure.  If you are between 53-65 years old, ask your health care provider if you should take aspirin to prevent heart disease.  Have regular diabetes screenings by checking your fasting blood sugar level.  If you are at a normal weight and have a low risk for diabetes, have this test once every three years after the age of 84.  If you are overweight and have a high risk for diabetes, consider being tested at a younger age or more often.  A one-time screening for abdominal aortic aneurysm (AAA) by ultrasound is recommended for men aged 74-75 years who are current or former smokers. What should I know about preventing infection? Hepatitis B  If you have a higher risk for hepatitis B, you should be screened for this virus. Talk with your health care provider to find out if you are at risk for hepatitis B infection. Hepatitis C  Blood testing is recommended for:  Everyone born from [birth year] through 1963.  Anyone with known risk factors for hepatitis C. Sexually Transmitted Diseases (STDs)  You should be screened each year for STDs including gonorrhea and chlamydia if:  You are sexually active and are younger than 81 years of age.  You are older than 81 years of age  and your health care provider tells you that you are at risk for this type of infection.  Your sexual activity has changed since you were last screened and you are at an increased risk for chlamydia or gonorrhea. Ask your health care provider if you are at risk.  Talk with your health care provider about whether you are at high risk of being infected with HIV. Your health care provider may recommend a prescription medicine to help prevent HIV infection. What else can I do?  Schedule regular health, dental, and eye exams.  Stay current with your vaccines (immunizations).  Do not use any tobacco products, such as cigarettes, chewing tobacco, and e-cigarettes. If you need help quitting, ask your health care provider.  Limit alcohol intake to no more than 2 drinks per day. One drink equals 12 ounces of beer, 5 ounces of wine, or 1 ounces of hard liquor.  Do not use street drugs.  Do not share needles.  Ask your health care provider for help if you need support or information about quitting drugs.  Tell your health care provider if you often feel depressed.  Tell your health care provider if you have ever been abused or do not feel safe at home. This information is not intended to replace advice given to you by your health care provider. Make sure you discuss any questions you have with your health care provider. Document Released: 01/20/2008 Document Revised: 03/22/2016 Document Reviewed: 04/27/2015 Elsevier Interactive Patient Education  2017 [employer].

## 2016-11-10 NOTE — Assessment & Plan Note (Signed)
Atrial fibrillation noted on exam and rate controlled with no evidence of RVR. Anticoagulated with warfarin with no nuisance bleeding. Continue to monitor.

## 2016-11-10 NOTE — Assessment & Plan Note (Signed)
Currently maintained on atorvastatin. Obtain lipid profile. Continue current dosage of atorvastatin pending lipid profile results.

## 2016-11-13 ENCOUNTER — Other Ambulatory Visit: Payer: Self-pay | Admitting: Pulmonary Disease

## 2016-12-02 ENCOUNTER — Other Ambulatory Visit: Payer: Self-pay | Admitting: Pulmonary Disease

## 2016-12-12 ENCOUNTER — Ambulatory Visit (INDEPENDENT_AMBULATORY_CARE_PROVIDER_SITE_OTHER): Payer: Medicare Other | Admitting: *Deleted

## 2016-12-12 ENCOUNTER — Ambulatory Visit (INDEPENDENT_AMBULATORY_CARE_PROVIDER_SITE_OTHER): Payer: Medicare Other | Admitting: Podiatry

## 2016-12-12 ENCOUNTER — Encounter: Payer: Self-pay | Admitting: Podiatry

## 2016-12-12 DIAGNOSIS — I4821 Permanent atrial fibrillation: Secondary | ICD-10-CM

## 2016-12-12 DIAGNOSIS — B351 Tinea unguium: Secondary | ICD-10-CM

## 2016-12-12 DIAGNOSIS — I4891 Unspecified atrial fibrillation: Secondary | ICD-10-CM

## 2016-12-12 DIAGNOSIS — I482 Chronic atrial fibrillation: Secondary | ICD-10-CM | POA: Diagnosis not present

## 2016-12-12 DIAGNOSIS — Z7901 Long term (current) use of anticoagulants: Secondary | ICD-10-CM

## 2016-12-12 DIAGNOSIS — M79606 Pain in leg, unspecified: Secondary | ICD-10-CM

## 2016-12-12 DIAGNOSIS — I739 Peripheral vascular disease, unspecified: Secondary | ICD-10-CM

## 2016-12-12 DIAGNOSIS — I6350 Cerebral infarction due to unspecified occlusion or stenosis of unspecified cerebral artery: Secondary | ICD-10-CM | POA: Diagnosis not present

## 2016-12-12 LAB — POCT INR: INR: 3.4

## 2016-12-12 NOTE — Progress Notes (Signed)
Patient ID: [name] [name], male   DOB: 04/20/1926, 83 y.o.   MRN: [id number]  Complaint:  Visit Type: Patient returns to my office for continued preventative foot care services. Complaint: Patient states" my nails have grown long and thick and become painful to walk and wear shoes"  The patient presents for preventative foot care services. No changes to ROS  Podiatric Exam: Vascular: dorsalis pedis and posterior tibial pulses are palpable bilateral. Capillary return is immediate. Temperature gradient is WNL. Skin turgor WNL  Sensorium: Normal Semmes Weinstein monofilament test. Normal tactile sensation bilaterally. Nail Exam: Pt has thick disfigured discolored nails with subungual debris noted bilateral entire nail hallux through fifth toenails Ulcer Exam: There is no evidence of ulcer or pre-ulcerative changes or infection. Orthopedic Exam: Muscle tone and strength are WNL. No limitations in general ROM. No crepitus or effusions noted. Foot type and digits show no abnormalities. Bony prominences are unremarkable. Skin: No Porokeratosis. No infection or ulcers  Diagnosis:  Onychomycosis, , Pain in right toe, pain in left toes  Treatment & Plan Procedures and Treatment: Consent by patient was obtained for treatment procedures. The patient understood the discussion of treatment and procedures well. All questions were answered thoroughly reviewed. Debridement of mycotic and hypertrophic toenails, 1 through 5 bilateral and clearing of subungual debris. No ulceration, no infection noted.  Return Visit-Office Procedure: Patient instructed to return to the office for a follow up visit 3 months for continued evaluation and treatment.   Lorraina Spring DPM 

## 2016-12-14 ENCOUNTER — Telehealth: Payer: Self-pay | Admitting: Family

## 2016-12-14 NOTE — Telephone Encounter (Signed)
Please have patient schedule an office visit as he has not been seen for this issue.

## 2016-12-14 NOTE — Telephone Encounter (Signed)
Pt would like a call back. He is still having phlegm in his throat. Would like a rx if possible  [facility] on [address]

## 2016-12-19 ENCOUNTER — Ambulatory Visit: Payer: Medicare Other | Admitting: Family

## 2016-12-26 ENCOUNTER — Ambulatory Visit (INDEPENDENT_AMBULATORY_CARE_PROVIDER_SITE_OTHER): Payer: Medicare Other

## 2016-12-26 DIAGNOSIS — I6350 Cerebral infarction due to unspecified occlusion or stenosis of unspecified cerebral artery: Secondary | ICD-10-CM

## 2016-12-26 DIAGNOSIS — I4891 Unspecified atrial fibrillation: Secondary | ICD-10-CM | POA: Diagnosis not present

## 2016-12-26 DIAGNOSIS — Z7901 Long term (current) use of anticoagulants: Secondary | ICD-10-CM | POA: Diagnosis not present

## 2016-12-26 LAB — POCT INR: INR: 1.9

## 2017-01-23 ENCOUNTER — Ambulatory Visit (INDEPENDENT_AMBULATORY_CARE_PROVIDER_SITE_OTHER): Payer: Medicare Other | Admitting: *Deleted

## 2017-01-23 DIAGNOSIS — I4891 Unspecified atrial fibrillation: Secondary | ICD-10-CM | POA: Diagnosis not present

## 2017-01-23 DIAGNOSIS — I6350 Cerebral infarction due to unspecified occlusion or stenosis of unspecified cerebral artery: Secondary | ICD-10-CM

## 2017-01-23 DIAGNOSIS — Z7901 Long term (current) use of anticoagulants: Secondary | ICD-10-CM | POA: Diagnosis not present

## 2017-01-23 LAB — POCT INR: INR: 2.7

## 2017-02-05 ENCOUNTER — Other Ambulatory Visit: Payer: Self-pay | Admitting: Pulmonary Disease

## 2017-02-27 ENCOUNTER — Ambulatory Visit (INDEPENDENT_AMBULATORY_CARE_PROVIDER_SITE_OTHER): Payer: Medicare Other

## 2017-02-27 DIAGNOSIS — I6350 Cerebral infarction due to unspecified occlusion or stenosis of unspecified cerebral artery: Secondary | ICD-10-CM | POA: Diagnosis not present

## 2017-02-27 DIAGNOSIS — Z7901 Long term (current) use of anticoagulants: Secondary | ICD-10-CM

## 2017-02-27 DIAGNOSIS — I4891 Unspecified atrial fibrillation: Secondary | ICD-10-CM

## 2017-02-27 LAB — POCT INR: INR: 2.4

## 2017-03-05 ENCOUNTER — Other Ambulatory Visit: Payer: Self-pay | Admitting: Pulmonary Disease

## 2017-03-08 ENCOUNTER — Other Ambulatory Visit: Payer: Self-pay | Admitting: Family

## 2017-03-08 ENCOUNTER — Other Ambulatory Visit: Payer: Self-pay | Admitting: Pulmonary Disease

## 2017-03-08 NOTE — Telephone Encounter (Signed)
Rx request of alluprinol 100mg , #30 tabs w/ 0 RF. Pt is currently on warfarin. Pt last seen 04/17/16.  SN, please advise if it is okay for this med to be filled.

## 2017-03-19 ENCOUNTER — Other Ambulatory Visit: Payer: Self-pay | Admitting: Family

## 2017-03-19 ENCOUNTER — Telehealth: Payer: Self-pay | Admitting: Pulmonary Disease

## 2017-03-19 MED ORDER — PREDNISONE 5 MG (21) PO TBPK: 5.0000 mg | ORAL_TABLET | ORAL | 0 refills | Status: DC

## 2017-03-19 MED ORDER — DOXYCYCLINE HYCLATE 100 MG PO TABS: 100.0000 mg | ORAL_TABLET | Freq: Two times a day (BID) | ORAL | 0 refills | Status: DC

## 2017-03-19 NOTE — Telephone Encounter (Signed)
Per SN---  Change to doxy 100 mg  #14  1 po bid

## 2017-03-19 NOTE — Telephone Encounter (Signed)
Per SN----  zpak pred dosepak  And will need ROV for recheck.  Thanks

## 2017-03-19 NOTE — Telephone Encounter (Signed)
Drug to drug interactions was noted for Doxycycline as well. Posing a risk for hypoprothrombinemic effects.   Dr. Lenna Gilford, please advise. Thanks.

## 2017-03-19 NOTE — Telephone Encounter (Signed)
Drug to drug interaction with Zpak and Coumdain.   Dr. Lenna Gilford, please advise.

## 2017-03-19 NOTE — Telephone Encounter (Signed)
Per SN---  No antibiotics approved for use with coumadin.  He has had doxy in the past with out any issues.  His choice---can do the doxy or can come in for visit.    Spoke with the pts wife and she is aware to contact the coumadin clinic to make them aware that he will be on abx and pred so they can monitor him more closely.  She will do this and the abx and pred have been sent to the pharmacy.

## 2017-03-19 NOTE — Telephone Encounter (Signed)
Called and spoke to pt. Pt c/o increase in SOB, chest tightness, non prod cough x 3 -4 days. Pt denies chest congestion, f/c/s. Pt states he is not taking anything OTC to help with s/s. Pt is requesting recs from SN.   Dr. Lenna Gilford, please advise. Thanks.   Allergies  Allergen Reactions  . Ibuprofen     REACTION: causes low blood pressure    Current Outpatient Prescriptions on File Prior to Visit  Medication Sig Dispense Refill  . allopurinol (ZYLOPRIM) 100 MG tablet TAKE 1 TABLET(100 MG) BY MOUTH DAILY 30 tablet 0  . atorvastatin (LIPITOR) 20 MG tablet TAKE 1 TABLET BY MOUTH DAILY 30 tablet 11  . brinzolamide (AZOPT) 1 % ophthalmic suspension Place 1 drop into both eyes 2 (two) times daily.      . Cholecalciferol (VITAMIN D) 2000 UNITS CAPS Take 1 capsule by mouth daily.      . cloNIDine (CATAPRES) 0.2 MG tablet TAKE 1 TABLET BY MOUTH TWICE DAILY 180 tablet 0  . cloNIDine (CATAPRES) 0.2 MG tablet TAKE 1 TABLET(0.2 MG) BY MOUTH TWICE DAILY 180 tablet 0  . eplerenone (INSPRA) 25 MG tablet TAKE 1 TABLET BY MOUTH EVERY DAY 90 tablet 3  . finasteride (PROSCAR) 5 MG tablet     . LUMIGAN 0.01 % SOLN INT 1 GTT IN OU Q NIGHT  11  . meclizine (ANTIVERT) 25 MG tablet Take 1 tablet (25 mg total) by mouth 3 (three) times daily as needed. 50 tablet 5  . metoprolol succinate (TOPROL-XL) 100 MG 24 hr tablet TAKE 1 TABLET BY MOUTH DAILY WITH OR IMMEDIATELY FOLLOWING A MEAL 90 tablet 3  . pilocarpine (PILOCAR) 4 % ophthalmic solution 1 drop into left eye every 4 hrs as needed    . potassium chloride SA (K-DUR,KLOR-CON) 20 MEQ tablet TAKE 3 TABLETS BY MOUTH DAILY 270 tablet 3  . Tamsulosin HCl (FLOMAX) 0.4 MG CAPS Take 0.4 mg by mouth daily.      Marland Kitchen telmisartan (MICARDIS) 80 MG tablet Take 1 tablet (80 mg total) by mouth daily. 90 tablet 3  . torsemide (DEMADEX) 20 MG tablet TAKE 2 TABLETS BY MOUTH IN THE MORNING AND 1 TABLET IN THE EVENING 270 tablet 0  . torsemide (DEMADEX) 20 MG tablet TAKE 2 TABLETS BY  MOUTH EVERY MORNING AND TAKE 1 TABLET BY MOUTH EVERY EVENING 180 tablet 0  . verapamil (CALAN-SR) 120 MG CR tablet TAKE 1 TABLET BY MOUTH EVERY NIGHT AT BEDTIME 30 tablet 11  . warfarin (COUMADIN) 5 MG tablet Take 1/2 to 1 tablet daily or AS directed by coumadin clinic 60 tablet 1   No current facility-administered medications on file prior to visit.

## 2017-03-20 ENCOUNTER — Ambulatory Visit: Payer: Medicare Other | Admitting: Podiatry

## 2017-03-22 ENCOUNTER — Other Ambulatory Visit (INDEPENDENT_AMBULATORY_CARE_PROVIDER_SITE_OTHER): Payer: Medicare Other

## 2017-03-22 ENCOUNTER — Ambulatory Visit (INDEPENDENT_AMBULATORY_CARE_PROVIDER_SITE_OTHER)
Admission: RE | Admit: 2017-03-22 | Discharge: 2017-03-22 | Disposition: A | Discharge status: Home / Self Care | Payer: Medicare Other | Source: Ambulatory Visit | Attending: Pulmonary Disease | Admitting: Pulmonary Disease

## 2017-03-22 ENCOUNTER — Ambulatory Visit (INDEPENDENT_AMBULATORY_CARE_PROVIDER_SITE_OTHER): Payer: Medicare Other | Admitting: Pulmonary Disease

## 2017-03-22 VITALS — BP 128/72 | HR 76 | Temp 98.1°F | Ht 71.0 in | Wt 178.0 lb

## 2017-03-22 DIAGNOSIS — R7309 Other abnormal glucose: Secondary | ICD-10-CM | POA: Diagnosis not present

## 2017-03-22 DIAGNOSIS — I281 Aneurysm of pulmonary artery: Secondary | ICD-10-CM

## 2017-03-22 DIAGNOSIS — I272 Pulmonary hypertension, unspecified: Secondary | ICD-10-CM

## 2017-03-22 DIAGNOSIS — M150 Primary generalized (osteo)arthritis: Secondary | ICD-10-CM

## 2017-03-22 DIAGNOSIS — F419 Anxiety disorder, unspecified: Secondary | ICD-10-CM

## 2017-03-22 DIAGNOSIS — R0600 Dyspnea, unspecified: Secondary | ICD-10-CM

## 2017-03-22 DIAGNOSIS — N259 Disorder resulting from impaired renal tubular function, unspecified: Secondary | ICD-10-CM

## 2017-03-22 DIAGNOSIS — I5032 Chronic diastolic (congestive) heart failure: Secondary | ICD-10-CM

## 2017-03-22 DIAGNOSIS — I482 Chronic atrial fibrillation: Secondary | ICD-10-CM

## 2017-03-22 DIAGNOSIS — I10 Essential (primary) hypertension: Secondary | ICD-10-CM | POA: Diagnosis not present

## 2017-03-22 DIAGNOSIS — I4821 Permanent atrial fibrillation: Secondary | ICD-10-CM

## 2017-03-22 DIAGNOSIS — M159 Polyosteoarthritis, unspecified: Secondary | ICD-10-CM

## 2017-03-22 DIAGNOSIS — I359 Nonrheumatic aortic valve disorder, unspecified: Secondary | ICD-10-CM | POA: Diagnosis not present

## 2017-03-22 LAB — COMPREHENSIVE METABOLIC PANEL
ALBUMIN: 4.3 g/dL (ref 3.5–5.2)
ALT: 13 U/L (ref 0–53)
AST: 19 U/L (ref 0–37)
Alkaline Phosphatase: 103 U/L (ref 39–117)
BUN: 30 mg/dL — AB (ref 6–23)
CHLORIDE: 98 meq/L (ref 96–112)
CO2: 31 mEq/L (ref 19–32)
Calcium: 10.2 mg/dL (ref 8.4–10.5)
Creatinine, Ser: 1.49 mg/dL (ref 0.40–1.50)
GFR: 57.03 mL/min — AB (ref >60.00)
Glucose, Bld: 117 mg/dL — ABNORMAL HIGH (ref 70–99)
POTASSIUM: 4.2 meq/L (ref 3.5–5.1)
SODIUM: 137 meq/L (ref 135–145)
Total Bilirubin: 1.2 mg/dL (ref 0.2–1.2)
Total Protein: 7.5 g/dL (ref 6.0–8.3)

## 2017-03-22 LAB — CBC WITH DIFFERENTIAL/PLATELET
BASOS ABS: 0 10*3/uL (ref 0.0–0.1)
Basophils Relative: 0.3 % (ref 0.0–3.0)
EOS ABS: 0 10*3/uL (ref 0.0–0.7)
Eosinophils Relative: 0 % (ref 0.0–5.0)
HCT: 45.4 % (ref 39.0–52.0)
Hemoglobin: 14.5 g/dL (ref 13.0–17.0)
LYMPHS ABS: 0.9 10*3/uL (ref 0.7–4.0)
Lymphocytes Relative: 9.1 % — ABNORMAL LOW (ref 12.0–46.0)
MCHC: 32.1 g/dL (ref 30.0–36.0)
MCV: 103.7 fl — AB (ref 78.0–100.0)
Monocytes Absolute: 0.5 10*3/uL (ref 0.1–1.0)
Monocytes Relative: 5.5 % (ref 3.0–12.0)
NEUTROS ABS: 8.2 10*3/uL — AB (ref 1.4–7.7)
PLATELETS: 220 10*3/uL (ref 150.0–400.0)
RBC: 4.37 Mil/uL (ref 4.22–5.81)
RDW: 17.2 % — ABNORMAL HIGH (ref 11.5–15.5)
WBC: 9.6 10*3/uL (ref 4.0–10.5)

## 2017-03-22 LAB — BRAIN NATRIURETIC PEPTIDE: Pro B Natriuretic peptide (BNP): 1195 pg/mL — ABNORMAL HIGH (ref 0.0–100.0)

## 2017-03-22 LAB — HEMOGLOBIN A1C: HEMOGLOBIN A1C: 6.1 % (ref 4.6–6.5)

## 2017-03-22 LAB — TSH: TSH: 2.37 u[IU]/mL (ref 0.35–4.50)

## 2017-03-22 NOTE — Patient Instructions (Signed)
Today we updated your med list in our EPIC system...    Continue your current medications the same for now...  Be sure to wear your oxygen at 2L/min flow rate all the time- 24/7...  We decided to recheck your current situation w/ a CXR, blood work, and a 2DEchocardiogram...    We will contact you w/ the results when available...   Based on these results we should have a better idea about any options that may be available to you (to treat your condition)...  You need to be more active-- try some CHAIR exercises at home (with your oxygen on)...  Call for any questions or if we can be of service in any way.Marland KitchenMarland Kitchen

## 2017-03-23 ENCOUNTER — Encounter: Payer: Self-pay | Admitting: Pulmonary Disease

## 2017-03-23 NOTE — Progress Notes (Signed)
Subjective:    Patient ID: [name] [name], male    DOB: 08-03-1926, 83 y.o.   MRN: [id number]  HPI 83 y/o BM here for a follow up visit... he has multiple medical problems including HBP, CAD, AS, AFib, & CHF followed by DrHochrein & the CHFclinic;  known PulmHTN & PA aneurysm conservatively managed by DrGearhardt;  Hyperchol;  RI & BPH;  DJD & Gout... ~  SEE PREV EPIC NOTES FOR OLDER DATA >>   ~  July 16, 2012:  23moROV & [name] reports worsening vision due to his Glaucoma- on eye drops & followed closely by Ophthalmology;  BP was recently elev at DrTannenbaum's office & we decided to increase his doses of Clonidine & Metoprolol meds...  We reviewed the following medical problems during today's office visit>>     HBP> on MetoprololER100-1/2 daily, CalanSR120, Micardis80, Clonidine0.2-1/2Bid, Demadex20-3/d, Epleronone25, K20-3/d; BP=144/92 & he has sl HA; we decided to incr his Metopto 1058md & the Clonidine0.2 to 1 tab Bid...    CHF> followed by DrHochrein for Cards- w/ severe Diastolic CHF, normal C's by cath in 2005, & normal LV systolic function w/ EFDU=20%notes DOE- no change.    AS> w/ severely calcif AoV leaflets & mod AS w/ 2549mean gradient (peak=39); pt desired conservative Rx, DrHochrein's notes are reviewed...    AFib> on Coumadin via CC; stable AFib on Coumadin w/ rate control...    Pulm Art aneurysm & PulmHTN> O2sat=94% on RA at rest & he has home O2 but just uses it "prn";  3 cm PA aneurysm followed by DrGearhardt without change x yrs...    CHOL> on Lip20; last FLP 6/12 showed TChol 162, TG 95, HDL 46, LDL 97; he doesn't seem to remember to come fasting for f/u labs...    Renal Insuffic, BPH> on Flomax0.4; baseline Creat= 1.5-1.6 range; followed by DrTannenbaum & managed conservatively...    DJD, Gout> on Tramadol, Allopurinol100, VitD2000; stable w/o specific complaints at present... We reviewed prob list, meds, xrays and labs> see below for updates >> he had the 2013 Flu  vaccine in Oct...   NOTE> pt did not go to the lab for his BMet & BNP as requested...  ADDENDUM: DrHochrein ordered CDopplers 12/13> mild heterogeneous plaque on right & mixed irreg plaque on left, 0-39% bilat ICA stenoses & antegrade vertebral flow; f/u 51yr28yr  ~  January 14, 2013:  34mo 29mo& [name] [name] is stable- no new complaints or concerns; he has a sm lipoma on the right post neck area (pointed out by the HouseLevi Strausse for [employer]);[facility] reviewed the following medical problems during today's office visit >>     HBP> on MetoprololER100, CalanSR120, Micardis80, Clonidine0.2Bid, Demadex20-3/d, Epleronone25, K20-3/d; BP=130/80 & he is asked to monitor BP at home more closely; he denies CP, palpit, dizzy, [name] in SOB/DOE, edema...    CHF> followed by DrHochrein for Cards- w/ severe Diastolic CHF, normal C's by cath in 2005, & normal LV systolic function w/ EF=75UR=42%n 1/14- stable, notes DOE- no change, pt refused f/u 2DEcho, rec conservative Rx...    AS> w/ severely calcif AoV leaflets & mod AS w/ 25mm 58m gradient (peak=39); last 2DEcho 10/12- pt desired conservative Rx, DrHochrein's notes are reviewed...    AFib> on Coumadin via CC; stable AFib on Coumadin w/ rate control...    Pulm Art aneurysm & PulmHTN> O2sat=94% on RA at rest & he has home O2 but just uses it "prn";  3cm PA aneurysm followed  by DrGearhardt without change x yrs...    CHOL> on Lip20; last FLP 6/12 showed TChol 162, TG 95, HDL 46, LDL 97; asked to ret for f/u FASTING labs    Renal Insuffic, BPH> on Flomax0.4; baseline Creat= 1.4-1.6 range; followed by DrTannenbaum & managed conservatively, last seen 10/13- note reviewed...    DJD, Gout> on Tramadol, Allopurinol100, VitD2000; stable w/o specific complaints at present... We reviewed prob list, meds, xrays and labs> see below for updates >>   CXR 6/14 showed borderline cardiomeg, extremely tort Ao, PA dilatation is the same (no change), elev left hemidiaph, scarring at bases,  NAD...  LABS 6/14:  Pending...  ~  July 15, 2013:  45moROV & [name] [name] is stable, here w/ his daighter who confirms; as noted he is blind in left eye w/ poor vision on right as well (can't read or drive, can see some TV from a distance), hx Glaucoma Rx by DrBond; he has not been exercising much- has a lCivil Service fast streamernamed [name] [name] & I suggested he get out & walk the dog w/ family help...  He has some minor somatic complaints- notes "ridges, wrinkles" on his scalp that seem to come & go, notes mild arthritis pain in back, VI/ edema in legs; we offered reassurance, discussed Aleve, Tylenol, no salt, elevation, support hose & continue current meds...    HBP> on MetoprololER100, CalanSR120, Micardis80, Clonidine0.2Bid, Demadex20-3/d, Epleronone25, K20-3/d; BP=132/78 & he denies CP, palpit, dizzy, [name] in SOB/DOE, edema...    CHF> followed by DrHochrein for Cards- w/ severe Diastolic CHF, normal C's by cath in 2005, & normal LV systolic function w/ EQQ=76% seen 1/14- stable, notes DOE- no change, pt refused f/u 2DEcho, rec conservative Rx...    AS> w/ severely calcif AoV leaflets & mod AS w/ 2653mmean gradient (peak=39); last 2DEcho 10/12- pt desired conservative Rx, DrHochrein's notes are reviewed...    AFib> on Coumadin via CC; stable AFib on Coumadin w/ rate control from [place].    Pulm Art aneurysm & PulmHTN> O2sat=98% on RA at rest today & he has home O2 but just uses it "prn";  3cm PA aneurysm followed by DrGearhardt without change x yrs...    CHOL> on Lip20; last FLP 6/14 showed TChol 150, TG 89, HDL 51, LDL 81... Continue same rx...    Renal Insuffic, BPH> on Flomax0.4; baseline Creat= 1.4-1.6 range; followed by DrTannenbaum & managed conservatively, last seen 10/13- note reviewed...    DJD, Gout> on Allopurinol100, VitD2000, Aleve/ Tylenol; stable w/o specific complaints at present...  ~  January 08, 2014:  53m9moV & [name] [name] reports interval eval by [name] Mccallumr rash on scalp (looks like Seb  Dermatitis) and w/ Podiatry- he indicates improved...     HBP> on MetoprololER100, CalanSR120, Micardis80, Clonidine0.2Bid, Demadex20-3/d, Epleronone25, K20-3/d; BP=132/84 & he denies CP, palpit, dizzy, [name] in SOB/DOE, edema...    CHF> followed by DrHochrein for Cards- w/ severe Diastolic CHF, normal C's by cath in 2005, & normal LV systolic function w/ EF=PP=50%een 2/15- stable, notes DOE- no change, pt refused f/u 2DEcho, rec conservative Rx...    AS> w/ severely calcif AoV leaflets & mod AS w/ 26m52man gradient (peak=39); last 2DEcho 10/12- pt desired conservative Rx, DrHochrein's notes are reviewed...    AFib> on Coumadin via CC; stable AFib on Coumadin w/ [place] from [place] wants to continue as is...    Pulm Art aneurysm & PulmHTN> O2sat=95% on RA at rest today & he  has home O2 but just uses it "prn";  3cm PA aneurysm followed by DrGearhardt without change x yrs...    CHOL> on Lip20; FLP 6/15 shows TChol 147, TG 82, HDL 55, LDL 76... Stable, continue same rx...    Renal Insuffic, BPH> on Flomax0.4; baseline Creat= 1.4-1.6 range; followed by DrTannenbaum & managed conservatively, last seen 10/13- note reviewed...    DJD, Gout> on Allopurinol100, VitD2000, Aleve/ Tylenol; stable w/o specific complaints at present... We reviewed prob list, meds, xrays and labs> see below for updates >>   CXR 6/15 showed similar prominent PA segment, mild cardiomeg & enlarged desc thor Ao, clear lungs w/ elev left hemidiaph (stable), DJD in Tspine...   LABS 6/15:  FLP- at goals on Lip20;  Chems- ok w/ BS=121 on diet, Cr=1.6 stable RI;  CBC- wnl;  TSH=2.80;  Uric=6.2 on allopurinol100;  BNP=511  ~  November 16, 2015:  29moROV & [name] [name] has received his PCP f/u w/ GTerri Piedra and continues to f/u w/ DrHochrein for Cards;  He is now 83y/o> He presents today c/o mucus in his lungs- notes sl cough, small amt yellow sput, no hemoptysis and incr SOB- all x several wks he says;  Denies f/c/s, no CP, & he  doesn't know what brought this on? He has Home O2 but only uses it prn- occas at night, occas days if SOB, does not use it w/ activ;  He has NOT been exercising "just walking in the house" & he notes DOE w/ ADLs "for some time now";  He has a lot of back pain, his glaucoma is worse w/ decr vision, & he recently saw Urology- on Flomax... We reviewed the following medical problems during today's office visit >> Note: pt requested further eval for his dyspnea which is actually a rather chronic problem...    Dyspnea> this is clearly multifactorial- 83y/o, sedentary/ deconditioned, diastolicCHF, modAS, AFib, PulmHTN; he has Home O2 but only uses it "prn"    HBP> on MetoprololER100, CalanSR120, Micardis80, Clonidine0.2Bid, Demadex20-3/d, Epleronone25, K20-3/d; BP=112/82 & he denies CP, palpit, dizzy, edema but has SOB/DOE as above...    CHF> followed by DrHochrein for Cards- w/ severe Diastolic CHF, normal C's by cath in 2005, & normal LV systolic function w/ EAJ=28% seen 1/17- stable, notes DOE- no change, pt refused f/u 2DEcho, wants conservative Rx...    AS> w/ severely calcif AoV leaflets & mod AS w/ 254mmean gradient (peak=39); last 2DEcho 10/12- pt desired conservative Rx, DrHochrein's notes are reviewed...    AFib> on Coumadin via CC; stable AFib on Coumadin w/ rate control from [place] wants to continue as is...    Pulm Art aneurysm & PulmHTN> O2sat=93% on RA at rest today & he has home O2 but just uses it "prn";  3cm PA aneurysm followed by DrGearhardt without change x yrs...    CHOL> on Lip20; FLP last 6/15 showed TChol 147, TG 82, HDL 55, LDL 76... Stable, continue same rx...    Renal Insuffic, BPH> on Flomax0.4; baseline Creat= 1.5-1.7 range; followed by DrTannenbaum & managed conservatively, no recent notes avail to review...    DJD, Gout> on Allopurinol100, VitD2000, Aleve/ Tylenol; stable w/o specific complaints at present... EXAM shows Afeb, VSS, O2sat=93% on RA;  HEENT- neg,  mallampati2;  Chest- clear w/o w/r/r;  Heart- irreg AFib, Gr2/6SEM w/o r/g;  Abd- soft, nontender, neg;  Ext- neg w/o c/c/e;  Neuro- no focal deficits...  CXR 11/16/15>  elev left hemidiaph, markedly  tort Ao, right PA dilatation, sl incr markings, NAD...  Spirometry 11/16/15>  FVC=1.34 (37%), FEV1=0.95 (36%), %1sec=71%, mid-flows are reduced at 23% predicted;  This is c/w severe pulm restriction and superimposed small airways dis.  Ambulatory Oximetry 11/16/15>  O2sat=100% on RA at rest w/ pulse=75/min; He ambulated 1 Lap only & stopped due to fatigue, leg & back pain w/ lowest O2sat=92% w/ pulse 120/min  LABS 11/16/15>  Chems- ok x BUN=38, Cr=1.75;  CBC- ok w/ Hg=13.6;  TSH=2.58;  BNP=610;  Sed=41  2DEcho 12/06/15>  ModLVH, norm LVF w/ EF=60-65% & norm wall motion, AoV w/ thickened calcif leaflets & modAS, severe LAdil (64m) & RAdil, mildly reduced RVF, severeTR, mod pulmHTN w/ PAsys=64... IMP/PLAN>>  CMasaois 83y/o w/ mult chronic medical issues- modAS, mod pulmHTN & reduced RVF;  I told him that the main therapeutic intervention at this point is to convince him to use his O2 at 2L/min continuously 24/7 (think of it as a medication);  Concentrate on good deep breaths & consider IS; use Mucinex600- 1to2Bid for the congestion  ~  January 13, 2016:  254moOV & ChJahlonas been wearing his O2 regularly at night & more often during the day but still not 24/7 as recommended;  [place] 8869[place] w/ AS, diastolicCHF, AFib, pulm art aneurysm & pulmHTN on a complex medical regimen-- Coumadin, ToprolXL100, VerapamilER120, Micardis80, Catapress0.2Bid, Demadex20-3/d, Eplerenone25, K20-3/d and I wonder if he's taking all this regularly;  He is here w/ his daughter today & her main issue is to request HOME HEALTH help as she says he qualifies for 35H/wk- OK...     We reviewed above studies-- CHF w/ BNP=610 but BUB=38 & Cr=1.75 therefore not much wiggle room w/ his diuretics => we will  refer to CHF clinic for their input... EXAM shows Afeb, VSS, O2sat=92% on RA;  HEENT- neg, mallampati2;  Chest- clear w/o w/r/r;  Heart- irreg AFib, Gr2/6SEM w/o r/g;  Abd- soft, nontender, neg;  Ext- neg w/o c/c/e;  Neuro- no focal deficits... IMP/PLAN>>  We discussed referral to the CHF clinic, DrParadisefor his input regarding meds & optimization...  ~  April 17, 2016:  29m102moV & ChaDylann improved- having refused the CHF clinic he ret to PCP office w/ VI, stasis changes, ?mosquito bite then soaked feet in chlorox!  Rx w/ Doxy, no salt, elevation, ACE wrap, etc... He notes that edema is resolved & he is using his oxygen more often now, plus home PT exercises + walking his dalmation/lab mix "[name] [name]"...     He remains on O2 at 2L/min more often- supposed to be 24/7...    On Demadex20-2Qam&1Qpm + Eplerenone 66m51m K30- 3/d;  Edema is down & wt= 187#...     BP controlled w/ this + MetopER100, VerapER120, Micardis80, Clonidine0.2Bid; BP= 110/78 & he denies CP, palpit, [name] in SOB/ edema/ etc... EXAM shows Afeb, VSS, O2sat=96% on O2 at 2L;  HEENT- neg, mallampati2;  Chest- clear w/o w/r/r;  Heart- irreg AFib, Gr2/6SEM w/o r/g;  Abd- soft, nontender, neg;  Ext- neg w/o c/c/ tr edema;  Neuro- no focal deficits... IMP/PLAN>>  CharSinaiimproved, continue current meds the same + his home oxygen at 2L/min;  Given 2017 FLU vaccine today 7 encouraged regular exercise program... we plan rov 33mo.829mo  March 22, 2017:  3mo 69mo [name] [name] (89 [id number]/[phone] w/ his daugh [phone] called 3d ago c/o incr  SOB, chest tightness, nonproductive cough x 3-4d, w/o CP/ congestion/ f/c/s;  We called in Doxy & Pred dosepak (he is on Coumadin for AFib & followed in the coumadin clinic);  He has several other minor somatic complaints that he mentions to me (his PCP is [name] [name]) including a small cyst on his post neck & ?seborrheic rash which is really rather minor in degree on exam...  His main  medical issue has been his Aortic Stenosis, followed for Cards by DrHochrein, last 2DEcho was 12/2015 showing modLVH, norm LVF w/ EF=60-65% & norm wall motion, AoV w/ thickened calcif leaflets & modAS, severe LAdil (42m) & RAdil, mildly reduced RVF, severeTR, mod pulmHTN w/ PAsys=64...   EPIC review over the last yr >>    Seen by Oprometrist- DrHensel 12/15/16> for f/u of his open angle Glaucoma & legally blind, prev laser surg DrGigengack, on 4 drops & note reviewed...     Seen by Podiatry- DrMayer 12/12/16> for onychomycosis follow up, note reviewed...    [facility] reviewed, Medicare wellness exam...    Seen 11/07/16 by DLucinda Dellfor Urology> BPH w/ LUTS, some incontinence, nocturia; on Flomax0.4 & Finasteride 544md added...    He last saw DrHochrein 08/21/16> followed for HBP, diastCHF, AS, AFib, and PulmHTN w/ PA aneurysm; denies any new symptoms, DrHochrein notes that ChElbaeclined f/u Echo in the past & opted for conservative management; tolerates rate control meds and Coumadin anticoagulation; rec f/u 1y66yr We reviewed the following medical problems during today's office visit >>     Dyspnea> this is clearly multifactorial- [age]o, sedentary/deconditioned, mult cardiac issues- diastolicCHF, AS, AFib, PulmHTN; he has Home O2 using it Qhs but only prn days...    HBP> on MetoprololER100, CalanSR120, Micardis80, Clonidine0.2Bid, Demadex20-3/d, Epleronone25, K20-3/d; BP=128/72 & he denies CP, palpit, dizzy, edema but has SOB/DOE as above...    CHF> followed by DrHochrein for Cards- w/ severe DiastolicCHF, normal C's by cath in 2005, & normal LV systolic function w/ EF=WC=37%een 1/17- stable, notes incr SOB/DOE & agrees w/ further eval at this point (f/u 2DEcho etc).    AS> w/ severely calcif AoV leaflets & mod AS w/ 76m64man gradient (peak=63) on 2DEcho 12/2015; now c/o incr SOB & f/u 2DEcho ordered...    AFib> on Coumadin via CC; stable AFib on Coumadin w/ rate control from  [place] wants to continue as is...    Pulm Art aneurysm & PulmHTN> O2sat=90% on RA at rest today & he has home O2 but just uses it Qhs & prn days;  3cm PA aneurysm followed by DrGearhardt without change x yrs...    CHOL> on Lip20; FLP last 4/18 showed TChol 126, TG 89, HDL 46, LDL 62`... Stable, continue same rx...    Renal Insuffic, BPH> on Flomax0.4 & Finasteride 5mg;4mseline Creat= 1.5-1.7 range; followed by DrTannenbaum & managed conservatively, no recent notes avail to review...    DJD, Gout> on Allopurinol100, VitD2000, Aleve/ Tylenol; stable w/o specific complaints at present... EXAM shows Afeb, VSS, O2sat=90% on RA;  HEENT- neg, mallampati2;  Chest- clear w/o w/r/r;  Heart- irreg AFib, Gr3/6SEM w/o r/g;  Abd- soft, nontender, neg;  Ext- neg w/o c/c/ tr edema;  Neuro- no focal deficits...  CXR 03/22/17 (independently reviewed by me in the PACS system) showed stable mild cardiomeg, aortic atherosclerosis & tortuosity, mild vasc congestion & sm effusions, elev left hemidiaph- NAD...   LABS 03/22/17>  Chems- ok w/ BS=117, K=4.2, Cr=1.49;  BNP=1195;  A1c=6.1;  CBC- ok w/ Hg=14.5 w/ mcv=104;  TSH=2.37;  (NOTE: we incr Demadex20-2Bid for now & he'll need B12 level on return)  2DEcho 03/27/17 showed modLVH, hyperdynamic LVF w/ EF=75-80%, no regional wall motion abn; AoV severely thickened/ calcified w/ modAS (mean grad28, peak grad 47); mod LA dil (3m), severe RA dil, RV mod dil & mod reduced sys function; PAsys=53mg... IMP/PLAN>>  Some of ChDhruvaincr SOB is related to fluid overload (BNP=1195), hx severe diastolicCHF, he is on DeLXBWIOM35-5/H2am&1pm) + Inspra2515m & K20-3/d- so we decided to increase to 4 Demadex taking 2Bid;  He is asked to wear his O2 all day and night- 24/7;  He also wants to know if anything else can be done for his heart condition & dyspnea- [name] eval... Note: >50% of this 17m8mov was spent in counseling &  coordination of care...           Problem List:  OPHTHALMOLOGY - s/p cataract surg & followed by DrBond for worsening Glaucoma & poor vision on gtts daily...  PULMONARY HYPERTENSION (ICD-416.8) - on home oxygen 2L/min "Prn"... ~  Prev 2DEcho w/ est PAsys= 64... he has not had a right heart cath... ~  Repeat 2DEcho 10/12 showed mod LVH, norm sys function w/ EF=55-60% & norm wall motion, Gr2DD, severely calcif AoV leaflets w/ mod AS & 25mm35mn gradient (peak=39), mild MR, severe LAdil & RAdil, RV sys function normal, modTR, PAsys=43... ~  6/13:  O2sat today is 95% on RA at rest;  CXR 6/13 showed mod cardiomeg unchanged, ectasia & calcif in Ao, enlarged right pulm art (no change), elev left hemidiaph w/ bibasilar atx, NAD... ~  6/14:  O2sat today is 94% on RA at rest;  CXR 6/14 showed borderline cardiomeg, extremely tort Ao, PA dilatation is the same (no change), elev left hemidiaph, scarring at bases, NAD... ~  6/15:  O2sat today is 95% on RA at rest;  CXR 6/15 showed similar prominent PA segment, mild cardiomeg & enlarged desc thor Ao, clear lungs w/ elev left hemidiaph (stable), DJD in Tspine.  HYPERTENSION (ICD-401.9) -  ~  on METOPROLOL ER 100mg-68m tab daily,  VERAPAMIL 240mg/d62mICARDIS 80mg/d,33mONIDINE 0.2mg- 1/288md,  DEMADEX 20mg- 2am67mpm,  EPLERENONE 25mg/d, & 70m 3/d; he was prev on Minoxidil  but this discontinued by Cardiology... ~  6/10: DrHochrein added back the Clonidine & asked him, again, to monitor BP at home. ~  12/10:  they requested trial to decr K20's from 6/d required due to his Demedex... suggest adding ALDACTONE 25mg/d & de46m20 to 4/d w/ f/u labs... ~  6/11:  Aldactone caused gynecomastia & was switched to EPLERENONE 25mg/d by Ma97mrker w/ some improvment in side effect. ~  6/12:  BP controlled & labs sl improved w/ the Demadex decr to 3/d... ~  12/12:  BP= 126/72 today and not checking at home, tolerating meds well; denies HA, visual changes, CP, palipit,  dizziness, syncope; he has chr DOE without change and mild edema... ~  6/13:  BP= 120/72 & he remains stable w/o CP, palpit, +SOB w/o change, tr edema... ~  12/13: on MetoprololER100-1/2 daily, CalanSR120, Micardis80, Clonidine0.2-1/2Bid, Demadex20-3/d, Epleronone25, K20-3/d; BP=144/92 & he has sl HA; we decided to incr his Metopto 100mg/d & the 64midine0.2 to 1 tab Bid. ~  6/14: on MetoprololER100, CalanSR120, Micardis80, Clonidine0.2Bid, Demadex20-3/d, Epleronone25, K20-3/d; BP=130/80 & he is asked to monitor BP at  home more closely; he denies CP, palpit, dizzy, [name] in SOB/DOE, edema. ~  12/14: on MetoprololER100, CalanSR120, Micardis80, Clonidine0.2Bid, Demadex20-3/d, Epleronone25, K20-3/d; BP=132/78 & he denies CP, palpit, dizzy, [name] in SOB/DOE, edema. ~  6/15: on MetoprololER100, CalanSR120, Micardis80, Clonidine0.2Bid, Demadex20-3/d, Epleronone25, K20-3/d; BP=132/84 & he remains asymptomatic but sedentary...  CHF (ICD-428.0) - see meds above... followed in the CHF Clinic by DrHochrein;  he has severe Diastolic CHF, w/ normal C's by cath in 2005, & normal LV systolic function w/ BU=38%... notes DOE- no change. ~  labs 6/09 showed BNP= 761 ~  labs 9/09 showed BNP= 486 ~  labs 12/09 showed BNP = 568 ~  labs 6/10 showed BNP= 421 ~  labs 12/10 showed BNP= 577... adding SPIRONOLACTONE 32m/d. ~  labs 1/11 showed BNP= 395... later [name] to EPLERENONE 268md. ~  labs 12/11 showed BNP= 535 ~  Labs 6/12 showed BNP= 524 (on Demedex3/d + Eplerenone2545m). ~  Labs 12/12 showed BNP= 371 ~  4/13:  He had f/u DrHochrein> Mod AS, chr AFib, DD, etc; stable, no progressive symptoms & pt preferred conserv Rx; they discussed poss [name] [name] in future... ~  Labs 6/13 showed BNP= 381 (note: BUN=30 Creat=1.5) ~  1/14:  He had f/u DrHochrein> mult cardiovasc problems, doing well, no CP etc; rec salt & fluid restriction, pt declined f/u 2DEcho, on Coumadin & rate control, continue conservative management... ~  2/15:  He had  Cards f/u w/ DrHochrein> stable, notes DOE- no change, pt refused f/u 2DEcho, rec conservative Rx. ~  Stable on diuretic therapy, no changes made...  AORTIC STENOSIS (ICD-424.1) - moderate by 2DEcho & followed by DrHochrein... ~  repeat 2DEcho 1/09 hosp = similiar mod AS... ~  Repeat 2DEcho 1/12 showed mild LVH, norm sys function w/ EF=55-60% & no regional wall motion abn, mod AS, dilated LA&RA, modTR, PAsys=50... ~  Repeat 2DEcho 10/12 showed mod LVH, norm sys function w/ EF=55-60% & norm wall motion, Gr2DD, severely calcif AoV leaflets w/ mod AS & 45m34man gradient (peak=39), mild MR, severe LAdil & RAdil, RV sys function normal, modTR, PAsys=43... ~  Pt has refused f/u 2DEcho...  ATRIAL FIBRILLATION (ICD-427.31) - on COUMADIN & followed in the Coumadin Clinic. ~  EKG 4/13 showed AFib, rate82, LAD, NSSTTWA...  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 ~  CXR 1/09 showed unchanged right PA aneurysm, stable cardiomeg, elevated left hemidiaph w/ atx. ~  CXR 6/10 showed no change, Ao tortuous and calcif, NAD. ~  CXR 12/11 showed stable elv left hemidiaph, bibasilar atx/ scarring, cardiomeg, ectatic ao, right PA enlargement. ~  CXR 6/13 showed mod cardiomeg unchanged, ectasia & calcif in Ao, enlarged right pulm art (no change), elev left hemidiaph w/ bibasilar atx, NAD... ~Marland Kitchen CXR 6/14 showed borderline cardiomeg, tortAo, PA dilatation is the same (no change), elev left hemidiaph, scarring at bases, NAD. ~  CXR 6/15 showed similar prominent PA segment, mild cardiomeg & enlarged desc thor Ao, clear lungs w/ elev left hemidiaph (stable), DJD in Tspine.  ASPVD >> on Coumadin for his AFib... ~  DrHochrein ordered CDopplers 12/13> mild heterogeneous plaque on right & mixed irreg plaque on left, 0-39% bilat ICA stenoses & antegrade vertebral flow; f/u 68yr.18yrHYPERCHOLESTEROLEMIA (ICD-272.0) - now on LIPITOR 20mg/5mrev Simva40 changed due  to concomitant Verap). ~  FLP 1/[place] Simva40 showed TChol 95, TG 39, HDL 36, LDL 51 ~  FLP 12/09 on Simva40 showed TChol  167, TG 68, HDL 64, LDL 90 ~  FLP 1/11 on Simva40 showed TChol 153, TG 101, HDL 50, LDL 83... later [name] to [place]. ~  [place] 6/12 on Lip20 showed TChol 162, TG 95, HDL 46, LDL 97 ~  6/13: not fasting for FLP today... ~  6/14: FLP on Lip20 showed TChol 150, TG 89, HDL 51, LDL 81... ~  FLP 6/15 on Lip20 showed TChol 147, TG 82, HDL 55, LDL 76...  HEMORRHOIDS (ICD-455.6)  RENAL INSUFFICIENCY (ICD-588.9)  ~  Creat=1.4 - 1.6 during 1/09 hosp... ~  labs 6/09 & 9/09 showed Creat= 1.3 ~  labs 12/09 showed BUN= 20, Creat= 1.3, K= 4.0 ~  labs 6/10 showed BUN= 24, Creat= 1.3, K= 3.8 ~  labs 12/10 showed BUN= 12, Creat= 1.2, K= 3.6 (on 6 K20/d) ~  labs 4/11 showed BUN= 18, Creat= 1.4, K= 4.0 (on 4 KCl/d) ~  labs 12/11 showed BUN= 32, Creat= 1.8, K= 4.3 ~  Labs 6/12 showed BUN= 27, creat= 1.6, K= 4.1 ~  Labs 12/12 showed BUN=21, Creat=1.6, K=3.9 ~  Labs 6/13 showed BUN= 30, Creat= 1.5, K= 4.5 ~  Labs 6/14 showed BUN= 26, Cr= 1.5, K= 3.5 (encouraged to take the KCl 56mq- 3/d)... ~  Labs 6/15 showed BUN= 25, Cr= 1.6, K= 3.9  BENIGN PROSTATIC HYPERTROPHY, HX OF (ICD-V13.8) - followed by DrTannenbaum & taking FLOMAX 0.451md... last seen 5/09 and his note is reviewed... ~  labs 12/09 showed PSA= 1.42 ~  labs 2/11 showed PSA= 1.65 ~  Labs 6/13 showed PSA= 1.58 ~  10/13: he had Urology f/u DrTannenbaum> BPH w/ BOO, nocturia, mild Uincont; he didn't want any intervention & no changes made to his regimen...  CVA (ICD-434.91) - Stroke in 1990 in the setting of HBP...  DEGENERATIVE JOINT DISEASE, GENERALIZED (ICD-715.00) - he uses OTC pain meds & TRAMADOL [id number]...  GOUT (ICD-274.9) - on ALLOPURINOL 100m27m  & now off the Colchicine... ~  labs 10/08 showed Uric= 10.3 ~  labs 12/09 showed Uric= 7.7... hMarland Kitchen wants to continue same meds. ~  labs 12/11 showed Uric = 6.4 ~  Labs 6/15 showed  Uric= 6.2  INGUINAL HERNIA (ICD-550.90)  Health Maintenance - he receives the yearly seasonal Flu vaccines... f/u PNEUMOVAX at his request 2010...   Past Surgical History:  Procedure Laterality Date  . APPENDECTOMY    . CATARACT EXTRACTION    . cosmetic eye surgery    . INGUINAL HERNIA REPAIR      Outpatient Encounter Prescriptions as of 03/22/2017  Medication Sig  . allopurinol (ZYLOPRIM) 100 MG tablet TAKE 1 TABLET(100 MG) BY MOUTH DAILY  . atorvastatin (LIPITOR) 20 MG tablet TAKE 1 TABLET BY MOUTH DAILY  . brinzolamide (AZOPT) 1 % ophthalmic suspension Place 1 drop into both eyes 2 (two) times daily.    . Cholecalciferol (VITAMIN D) 2000 UNITS CAPS Take 1 capsule by mouth daily.    . cloNIDine (CATAPRES) 0.2 MG tablet TAKE 1 TABLET BY MOUTH TWICE DAILY  . cloNIDine (CATAPRES) 0.2 MG tablet TAKE 1 TABLET(0.2 MG) BY MOUTH TWICE DAILY  . doxycycline (VIBRA-TABS) 100 MG tablet Take 1 tablet (100 mg total) by mouth 2 (two) times daily.  . epMarland Kitchenerenone (INSPRA) 25 MG tablet TAKE 1 TABLET BY MOUTH EVERY DAY  . LUMIGAN 0.01 % SOLN INT 1 GTT IN OU Q NIGHT  . meclizine (ANTIVERT) 25 MG tablet Take 1 tablet (25 mg total) by mouth 3 (three) times daily as needed.  . metoprolol  succinate (TOPROL-XL) 100 MG 24 hr tablet TAKE 1 TABLET BY MOUTH DAILY WITH OR IMMEDIATELY FOLLOWING A MEAL  . pilocarpine (PILOCAR) 4 % ophthalmic solution 1 drop into left eye every 4 hrs as needed  . potassium chloride SA (K-DUR,KLOR-CON) 20 MEQ tablet TAKE 3 TABLETS BY MOUTH DAILY  . predniSONE (STERAPRED UNI-PAK 21 TAB) 5 MG (21) TBPK tablet Take 1 tablet (5 mg total) by mouth as directed.  . Tamsulosin HCl (FLOMAX) 0.4 MG CAPS Take 0.4 mg by mouth daily.    Marland Kitchen telmisartan (MICARDIS) 80 MG tablet Take 1 tablet (80 mg total) by mouth daily.  Marland Kitchen torsemide (DEMADEX) 20 MG tablet TAKE 2 TABLETS BY MOUTH IN THE MORNING AND 1 TABLET IN THE EVENING  . torsemide (DEMADEX) 20 MG tablet TAKE 2 TABLETS BY MOUTH EVERY MORNING AND  TAKE 1 TABLET BY MOUTH EVERY EVENING  . verapamil (CALAN-SR) 120 MG CR tablet TAKE 1 TABLET BY MOUTH EVERY NIGHT AT BEDTIME  . warfarin (COUMADIN) 5 MG tablet Take 1/2 to 1 tablet daily or AS directed by coumadin clinic  . [DISCONTINUED] finasteride (PROSCAR) 5 MG tablet    No facility-administered encounter medications on file as of 03/22/2017.     Allergies  Allergen Reactions  . Ibuprofen     REACTION: causes low blood pressure    Current Medications, Allergies, Past Medical History, Past Surgical History, Family History, and Social History were reviewed in [employer] record.    Review of Systems        See HPI - all other systems neg except as noted... The patient complains of decreased hearing, dyspnea on exertion, peripheral edema, and difficulty walking.  The patient denies anorexia, fever, weight loss, weight gain, vision loss, hoarseness, chest pain, syncope, prolonged cough, headaches, hemoptysis, abdominal pain, melena, hematochezia, severe indigestion/heartburn, hematuria, incontinence, muscle weakness, suspicious skin lesions, transient blindness, depression, unusual weight change, abnormal bleeding, enlarged lymph nodes, and angioedema.   Objective:   Physical Exam     WD, WN, 83 y/o BM in NAD... GENERAL:  Alert & oriented; pleasant & cooperative... HEENT:  [place]/AT, EOM-wnl, PERRLA, EACs-clear, TMs-wnl, NOSE-clear, THROAT-clear & wnl. NECK:  Supple w/ fairROM; no JVD; sl decr carotid impulses w/ transmit murmur; no thyromegaly or nodules palpated; no lymphadenopathy. CHEST:  Clear to P & A; without wheezes/ rales/ or rhonchi heard... HEART:  sl irreg, gr 2-3/6 AS murmur at base, without rubs or gallops apprec... ABDOMEN:  Soft & nontender; normal bowel sounds; no organomegaly or masses detected. EXT: without deformities, mild arthritic changes; no varicose veins/ +venous insuffic/ tr edema. NEURO:  CN's intact; no focal neuro deficits... DERM:  No  lesions noted; no rash etc...  RADIOLOGY DATA:  Reviewed in the EPIC EMR & discussed w/ the patient...  LABORATORY DATA:  Reviewed in the EPIC EMR & discussed w/ the patient...   Assessment & Plan:    HBP>  Adeq control on his extensive med regimen; we reviewed meds w/ family- they need to supervise & be sure he takes everything regularly...  CHF>  Stable on current meds; renal w/ Creat=1.75 stable as well...  AS>  Mod AS on 2DEcho & followed by DrHochrein. 03/22/17:   Some of Derion' incr SOB is related to fluid overload (BNP=1195), hx severe diastolicCHF, he is on LXBWIOM35-5/H (2am&1pm) + Inspra77m/d & K20-3/d- so we decided to increase to 4 Demadex taking 2Bid;  He is asked to wear his O2 all day and night- 24/7;  He also  wants to know if anything else can be done for his heart condition & dyspnea- we will refer to [name] for his eval...  AFib>  He remains on Coumadin followed in the [facility]...  PA art aneurysm & Pulm HTN>  Stable on current med regimen & BP control...  CHOL>  FLP looks good on Lip20...  Renal Insuffic>  Creat improved to 1.5-1.8 & stable on current meds...  DJD/ Gout>  Stable as well, c/o some rib pain as noted; we discussed rest/ heat/ rib binder & Tramadol...  Other medical problems as noted...   Patient's Medications  New Prescriptions   No medications on file  Previous Medications   ALLOPURINOL (ZYLOPRIM) 100 MG TABLET    TAKE 1 TABLET(100 MG) BY MOUTH DAILY   ATORVASTATIN (LIPITOR) 20 MG TABLET    TAKE 1 TABLET BY MOUTH DAILY   BRINZOLAMIDE (AZOPT) 1 % OPHTHALMIC SUSPENSION    Place 1 drop into both eyes 2 (two) times daily.     CHOLECALCIFEROL (VITAMIN D) 2000 UNITS CAPS    Take 1 capsule by mouth daily.     CLONIDINE (CATAPRES) 0.2 MG TABLET    TAKE 1 TABLET BY MOUTH TWICE DAILY   CLONIDINE (CATAPRES) 0.2 MG TABLET    TAKE 1 TABLET(0.2 MG) BY MOUTH TWICE DAILY   DOXYCYCLINE (VIBRA-TABS) 100 MG TABLET    Take 1 tablet (100 mg  total) by mouth 2 (two) times daily.   EPLERENONE (INSPRA) 25 MG TABLET    TAKE 1 TABLET BY MOUTH EVERY DAY   LUMIGAN 0.01 % SOLN    INT 1 GTT IN OU Q NIGHT   MECLIZINE (ANTIVERT) 25 MG TABLET    Take 1 tablet (25 mg total) by mouth 3 (three) times daily as needed.   METOPROLOL SUCCINATE (TOPROL-XL) 100 MG 24 HR TABLET    TAKE 1 TABLET BY MOUTH DAILY WITH OR IMMEDIATELY FOLLOWING A MEAL   PILOCARPINE (PILOCAR) 4 % OPHTHALMIC SOLUTION    1 drop into left eye every 4 hrs as needed   POTASSIUM CHLORIDE SA (K-DUR,KLOR-CON) 20 MEQ TABLET    TAKE 3 TABLETS BY MOUTH DAILY   PREDNISONE (STERAPRED UNI-PAK 21 TAB) 5 MG (21) TBPK TABLET    Take 1 tablet (5 mg total) by mouth as directed.   TAMSULOSIN HCL (FLOMAX) 0.4 MG CAPS    Take 0.4 mg by mouth daily.     TELMISARTAN (MICARDIS) 80 MG TABLET    Take 1 tablet (80 mg total) by mouth daily.   TORSEMIDE (DEMADEX) 20 MG TABLET    TAKE 2 TABLETS BY MOUTH IN THE MORNING AND 1 TABLET IN THE EVENING   TORSEMIDE (DEMADEX) 20 MG TABLET    TAKE 2 TABLETS BY MOUTH EVERY MORNING AND TAKE 1 TABLET BY MOUTH EVERY EVENING   VERAPAMIL (CALAN-SR) 120 MG CR TABLET    TAKE 1 TABLET BY MOUTH EVERY NIGHT AT BEDTIME   WARFARIN (COUMADIN) 5 MG TABLET    Take 1/2 to 1 tablet daily or AS directed by coumadin clinic  Modified Medications   No medications on file  Discontinued Medications   FINASTERIDE (PROSCAR) 5 MG TABLET

## 2017-03-26 ENCOUNTER — Other Ambulatory Visit: Payer: Self-pay | Admitting: Cardiology

## 2017-03-27 ENCOUNTER — Other Ambulatory Visit: Payer: Self-pay

## 2017-03-27 ENCOUNTER — Ambulatory Visit (HOSPITAL_COMMUNITY): Payer: Medicare Other | Attending: Pulmonary Disease

## 2017-03-27 ENCOUNTER — Ambulatory Visit (INDEPENDENT_AMBULATORY_CARE_PROVIDER_SITE_OTHER): Payer: Medicare Other | Admitting: *Deleted

## 2017-03-27 DIAGNOSIS — Z7901 Long term (current) use of anticoagulants: Secondary | ICD-10-CM | POA: Diagnosis not present

## 2017-03-27 DIAGNOSIS — I6350 Cerebral infarction due to unspecified occlusion or stenosis of unspecified cerebral artery: Secondary | ICD-10-CM | POA: Diagnosis not present

## 2017-03-27 DIAGNOSIS — I420 Dilated cardiomyopathy: Secondary | ICD-10-CM | POA: Diagnosis not present

## 2017-03-27 DIAGNOSIS — I080 Rheumatic disorders of both mitral and aortic valves: Secondary | ICD-10-CM | POA: Insufficient documentation

## 2017-03-27 DIAGNOSIS — I4891 Unspecified atrial fibrillation: Secondary | ICD-10-CM

## 2017-03-27 DIAGNOSIS — I359 Nonrheumatic aortic valve disorder, unspecified: Secondary | ICD-10-CM | POA: Diagnosis not present

## 2017-03-27 LAB — POCT INR: INR: 3.2

## 2017-03-27 MED ORDER — PERFLUTREN LIPID MICROSPHERE
1.0000 mL | INTRAVENOUS | Status: AC | PRN
  Administered 2017-03-27: 1 mL via INTRAVENOUS

## 2017-03-30 ENCOUNTER — Other Ambulatory Visit: Payer: Self-pay | Admitting: Pulmonary Disease

## 2017-03-30 DIAGNOSIS — I350 Nonrheumatic aortic (valve) stenosis: Secondary | ICD-10-CM

## 2017-04-09 ENCOUNTER — Other Ambulatory Visit: Payer: Self-pay | Admitting: Family

## 2017-04-12 ENCOUNTER — Telehealth: Payer: Self-pay | Admitting: *Deleted

## 2017-04-12 NOTE — Telephone Encounter (Signed)
Received TAVR referral from Dr. Lenna Gilford. I placed call to pt to schedule this appointment.  Left message to call office.

## 2017-04-12 NOTE — Telephone Encounter (Signed)
I spoke with pt's daughter [name]) and scheduled pt to see Dr. Burt Knack on 04/26/17 at 11:40

## 2017-04-26 ENCOUNTER — Ambulatory Visit (INDEPENDENT_AMBULATORY_CARE_PROVIDER_SITE_OTHER): Payer: Medicare Other | Admitting: Cardiovascular Disease

## 2017-04-26 ENCOUNTER — Encounter: Payer: Self-pay | Admitting: Cardiovascular Disease

## 2017-04-26 VITALS — BP 142/88 | HR 73 | Ht 71.0 in | Wt 177.4 lb

## 2017-04-26 DIAGNOSIS — I350 Nonrheumatic aortic (valve) stenosis: Secondary | ICD-10-CM

## 2017-04-26 MED ORDER — TORSEMIDE 20 MG PO TABS
20.0000 mg | ORAL_TABLET | Freq: Two times a day (BID) | ORAL | 11 refills | Status: DC
Start: 2017-04-26 — End: 2017-04-26

## 2017-04-26 MED ORDER — TORSEMIDE 20 MG PO TABS: 40.0000 mg | ORAL_TABLET | Freq: Two times a day (BID) | ORAL | 11 refills | Status: DC

## 2017-04-26 NOTE — Progress Notes (Signed)
Cardiology Office Note Date:  05/03/2017   ID:  OHM DENTLER, DOB Jan 27, 1926, MRN [id number]  PCP:  [place] [facility], FNP  Cardiologist:  Sherren Mocha, MD    Chief Complaint  Patient presents with  . TAVR consult     History of Present Illness: [name] is a 83 y.o. male who presents for evaluation of aortic stenosis, referred by Dr Lenna Gilford.   The patient has been followed by Dr Percival Spanish for several years for multiple cardiovascular problems. He has wanted conservative management as his health has declined and has not wanted to pursue aggressive treatment of his aortic stenosis.   Recently saw Dr Lenna Gilford with increasing shortness of breath. Medications were adjusted for his pulmonary disease and diuretics were increased. His breathing has improved since these adjustments were made. He is referred to explore the possibility of TAVR for treatment of his aortic stenosis and heart failure.   The patient lives independently. He is very sedentary. He hasn't driven in several years ago because of vision loss. Today he is in a wheelchair. He's on oxygen now all of the time. He is short of breath with low-level activity such as walking short distances inside of his home. He denies chest pain, lightheadedness, or syncope. He complains of generalized weakness. He's been maintained on long-term warfarin and denies any bleeding complications.   He worked at the [employer], retired in 2005. He lives alone. He has 5 children. His daughter who is here with him today is his primary caregiver.    Past Medical History:  Diagnosis Date  . Aneurysm of pulmonary artery ([place])   . Aortic stenosis   . Atrial fibrillation ([place])   . BPH (benign prostatic hypertrophy)   . Congestive heart failure, unspecified   . CVA (cerebral infarction)   . DJD (degenerative joint disease)   . Generalized osteoarthrosis, unspecified site   . Gout, unspecified   . Pulmonary hypertension ([place])   .  Pure hypercholesterolemia   . Renal insufficiency   . Unspecified cerebral artery occlusion with cerebral infarction   . Unspecified essential hypertension   . Unspecified hemorrhoids without mention of complication     Past Surgical History:  Procedure Laterality Date  . APPENDECTOMY    . CATARACT EXTRACTION    . cosmetic eye surgery    . INGUINAL HERNIA REPAIR      Current Outpatient Prescriptions  Medication Sig Dispense Refill  . allopurinol (ZYLOPRIM) 100 MG tablet TAKE 1 TABLET(100 MG) BY MOUTH DAILY 30 tablet 0  . atorvastatin (LIPITOR) 20 MG tablet TAKE 1 TABLET BY MOUTH DAILY 30 tablet 11  . bimatoprost (LUMIGAN) 0.01 % SOLN Place 1 drop into both eyes at bedtime.    . brinzolamide (AZOPT) 1 % ophthalmic suspension Place 1 drop into both eyes 2 (two) times daily.      . Cholecalciferol (VITAMIN D) 2000 UNITS CAPS Take 1 capsule by mouth daily.      . cloNIDine (CATAPRES) 0.2 MG tablet TAKE 1 TABLET BY MOUTH TWICE DAILY 180 tablet 0  . eplerenone (INSPRA) 25 MG tablet TAKE 1 TABLET BY MOUTH EVERY DAY 90 tablet 3  . meclizine (ANTIVERT) 25 MG tablet Take 25 mg by mouth 3 (three) times daily as needed for dizziness.    . metoprolol succinate (TOPROL-XL) 100 MG 24 hr tablet TAKE 1 TABLET BY MOUTH DAILY WITH OR IMMEDIATELY FOLLOWING A MEAL 90 tablet 3  . pilocarpine (PILOCAR) 4 % ophthalmic solution Place 1  drop into the left eye 2 (two) times daily.     . potassium chloride SA (K-DUR,KLOR-CON) 20 MEQ tablet TAKE 3 TABLETS BY MOUTH DAILY 270 tablet 3  . Tamsulosin HCl (FLOMAX) 0.4 MG CAPS Take 0.4 mg by mouth daily.      Marland Kitchen telmisartan (MICARDIS) 80 MG tablet Take 1 tablet (80 mg total) by mouth daily. 90 tablet 3  . torsemide (DEMADEX) 20 MG tablet Take 2 tablets (40 mg total) by mouth 2 (two) times daily. 120 tablet 11  . verapamil (CALAN-SR) 120 MG CR tablet TAKE 1 TABLET BY MOUTH EVERY NIGHT AT BEDTIME 30 tablet 11  . warfarin (COUMADIN) 5 MG tablet TAKE 1/2 TO 1 TABLET BY  MOUTH DAILY AS DIRECTED BY COUMADIN CLINIC 60 tablet 1   No current facility-administered medications for this visit.     Allergies:   Ibuprofen   Social History:  The patient  reports that he quit smoking about 13 years ago. His smoking use included Cigarettes and Cigars. He quit after 3.00 years of use. He has never used smokeless tobacco. He reports that he does not use drugs.   Family History:  The patient's family hx is negative for premature CAD   ROS:  Please see the history of present illness.  Otherwise, review of systems is positive for orthopnea, diarrhea, back pain, fatigue, irregular heart beats.  All other systems are reviewed and negative.    PHYSICAL EXAM: VS:  BP (!) 142/88   Pulse 73   Ht 5\' 11"  (1.803 m)   Wt 80.5 kg (177 lb 6.4 oz)   BMI 24.74 kg/m  , BMI Body mass index is 24.74 kg/m. GEN: Elderly, frail-appearing male, in no acute distress  HEENT: normal  Neck: no JVD, no masses. bilateral carotid bruits Cardiac: irregular with 3/6 harsh mid-peaking systolic murmur at the RUSB               Respiratory:  clear to auscultation bilaterally, normal work of breathing GI: soft, nontender, nondistended, + BS MS: no deformity or atrophy  Ext: trace bilateral pretibial edema Skin: warm and dry, no rash Neuro:  Strength and sensation are intact Psych: euthymic mood, full affect  EKG:  EKG is ordered today. The ekg ordered today shows atrial fibrillation 73 bpm, LAFB  Recent Labs: 03/22/2017: ALT 13; BUN 30; Creatinine, Ser 1.49; Hemoglobin 14.5; Platelets 220.0; Potassium 4.2; Pro B Natriuretic peptide (BNP) 1,195.0; Sodium 137; TSH 2.37   Lipid Panel     Component Value Date/Time   CHOL 126 11/10/2016 1457   TRIG 89.0 11/10/2016 1457   HDL 45.70 11/10/2016 1457   CHOLHDL 3 11/10/2016 1457   VLDL 17.8 11/10/2016 1457   LDLCALC 62 11/10/2016 1457      Wt Readings from Last 3 Encounters:  04/26/17 80.5 kg (177 lb 6.4 oz)  03/22/17 80.7 kg (178 lb)    11/10/16 83.5 kg (184 lb)     Cardiac Studies Reviewed: 2D Echo 03-27-2017: Study Conclusions  - Procedure narrative: Transthoracic echocardiography. Image   quality was adequate. Intravenous contrast (Definity) was   administered. - Left ventricle: The cavity size was normal. Wall thickness was   increased in a pattern of moderate LVH. Systolic function was   hyperdynamic. The estimated ejection fraction was in the range of   75% to 80%. Wall motion was normal; there were no regional wall   motion abnormalities. - Aortic valve: Moderately to severely calcified annulus. Severely   thickened, severely calcified  leaflets. There was moderate   stenosis. Mean gradient (S): 28 mm Hg. Peak gradient (S): 47 mm   Hg. - Mitral valve: Mildly calcified annulus. - Left atrium: The atrium was moderately dilated. - Right ventricle: The cavity size was moderately dilated. Systolic   function was mildly to moderately reduced. - Right atrium: The atrium was severely dilated. - Pulmonary arteries: Systolic pressure was moderately increased.   PA peak pressure: 54 mm Hg (S).  ------------------------------------------------------------------- Labs, prior tests, procedures, and surgery: Transthoracic echocardiography (12/06/2015).     EF was 60% and PA pressure was 64 (systolic).  ------------------------------------------------------------------- Study data:  Comparison was made to the study of 12/06/2015.  Study status:  Routine.  Procedure:  The patient reported no pain pre or post test. Transthoracic echocardiography. Image quality was adequate. Intravenous contrast (Definity) was administered.  Study completion:  There were no complications. Echocardiography.  M-mode, complete 2D, spectral Doppler, and color Doppler.  Birthdate:  Patient birthdate: 13-Feb-1926.  Age:  Patient is 83 yr old.  Sex:  Gender: male.    BMI: 24.8 kg/m^2.  Blood pressure:     128/72  Patient status:  Outpatient.   Study date: Study date: 03/27/2017. Study time: 01:02 PM.  Location:  [facility]  -------------------------------------------------------------------  ------------------------------------------------------------------- Left ventricle:  The cavity size was normal. Wall thickness was increased in a pattern of moderate LVH. Systolic function was hyperdynamic. The estimated ejection fraction was in the range of 75% to 80%. Wall motion was normal; there were no regional wall motion abnormalities. The study was not technically sufficient to allow evaluation of LV diastolic dysfunction due to atrial fibrillation.  ------------------------------------------------------------------- Aortic valve:   Moderately to severely calcified annulus. Severely thickened, severely calcified leaflets.  Doppler:   There was moderate stenosis.      VTI ratio of LVOT to aortic valve: 0.21. Valve area (VTI): 0.6 cm^2. Indexed valve area (VTI): 0.3 cm^2/m^2. Peak velocity ratio of LVOT to aortic valve: 0.2. Valve area (Vmax): 0.57 cm^2. Indexed valve area (Vmax): 0.28 cm^2/m^2. Mean velocity ratio of LVOT to aortic valve: 0.2. Valve area (Vmean): 0.56 cm^2. Indexed valve area (Vmean): 0.28 cm^2/m^2.    Mean gradient (S): 28 mm Hg. Peak gradient (S): 47 mm Hg.  ------------------------------------------------------------------- Aorta:  Ascending aorta: The ascending aorta was mildly dilated.  ------------------------------------------------------------------- Mitral valve:   Mildly calcified annulus.  Doppler:  There was trivial regurgitation.  ------------------------------------------------------------------- Left atrium:  The atrium was moderately dilated.  ------------------------------------------------------------------- Right ventricle:  The cavity size was moderately dilated. Systolic function was mildly to moderately  reduced.  ------------------------------------------------------------------- Pulmonic valve:    The valve appears to be grossly normal. Doppler:  There was no significant regurgitation.  ------------------------------------------------------------------- Tricuspid valve:   The valve appears to be grossly normal. Doppler:  There was mild regurgitation.  ------------------------------------------------------------------- Pulmonary artery:   Systolic pressure was moderately increased.  ------------------------------------------------------------------- Right atrium:  The atrium was severely dilated.  ------------------------------------------------------------------- Pericardium:  There was no pericardial effusion.  ------------------------------------------------------------------- Systemic veins: Inferior vena cava: The vessel was dilated. The respirophasic diameter changes were in the normal range (>= 50%), consistent with elevated central venous pressure.  ASSESSMENT AND PLAN: 83 yo male with multiple comorbid medical problems who appears to have moderate-severe aortic stenosis with NYHA functional class 3 symptoms of chronic diastolic heart failure.   I have reviewed the natural history of aortic stenosis with the patient and their family members  who are present today. We have discussed the limitations of  medical therapy and the poor prognosis associated with symptomatic aortic stenosis. We have reviewed potential treatment options, including palliative medical therapy, conventional surgical aortic valve replacement, and transcatheter aortic valve replacement. We discussed treatment options in the context of the patient's specific comorbid medical conditions.   I've reviewed the patient's echo images and hemodynamic/doppler data from his echocardiogram. He has marked biatrial enlargement from permanent atrial fibrillation. LV function is hyperdynamic with a very small LV  cavity. The aortic valve is calcified and restricted with doppler data suggestive of moderate aortic stenosis (mean gradient < 40 mmHg). The patient may have severe, paradoxical low-flow, low-gradient, aortic stenosis (mean gradient < 40 with normal LVEF) as he has severe LVH, small LV cavity, and a stroke volume index < 35.   Considering his advanced age and poor functional status on home O2, with pulmonary hypertension and RV dysfunction, stage 3 CKD, permanent AF, and his previous desire to avoid extensive workup/invasive treatment, I think it is best to manage him conservatively. I'm not convinced that proceeding with TAVR workup (cardiac cath, CT's of the heart and chest/abd/pelvis, etc) would be in his best interest. I suspect the risk of workup and treatment outweighs the potential benefit of TAVR in this elderly, frail gentleman. This is discussed at length with the patient and his daughter who understand and agree with plans for continued medical therapy.   They report improvement in his breathing when he temporarily increased his torsemide to 40 mg BID recently. I advised them to continue this dose and have a BMET in a few weeks. He will continue to follow with Dr Lenna Gilford and Dr Percival Spanish as planned.   Current medicines are reviewed with the patient today.  The patient does not have concerns regarding medicines.  Labs/ tests ordered today include:   Orders Placed This Encounter  Procedures  . Basic metabolic panel  . EKG 12-Lead    Disposition:   FU prn  Signed, Sherren Mocha, MD  05/03/2017 7:39 AM    [facility] [address], [place], [place]  [address] Phone: [phone]; Fax: [phone]

## 2017-04-26 NOTE — Patient Instructions (Addendum)
Medication Instructions:  1) INCREASE TORSEMIDE to 40 mg TWICE DAILY  Labwork: Your provider recommends that you return for lab work in [place].  Testing/Procedures: None  Follow-Up: Your provider recommends that you schedule a follow-up appointment in 3 MONTHS with Dr. Percival Spanish.  Your provider recommends that you schedule a follow-up appointment AS NEEDED with Dr. Burt Knack.  Any Other Special Instructions Will Be Listed Below (If Applicable).     If you need a refill on your cardiac medications before your next appointment, please call your pharmacy.

## 2017-05-01 ENCOUNTER — Ambulatory Visit (INDEPENDENT_AMBULATORY_CARE_PROVIDER_SITE_OTHER): Payer: Medicare Other | Admitting: Podiatry

## 2017-05-01 ENCOUNTER — Encounter: Payer: Self-pay | Admitting: Podiatry

## 2017-05-01 ENCOUNTER — Ambulatory Visit (INDEPENDENT_AMBULATORY_CARE_PROVIDER_SITE_OTHER): Payer: Medicare Other | Admitting: *Deleted

## 2017-05-01 DIAGNOSIS — I6350 Cerebral infarction due to unspecified occlusion or stenosis of unspecified cerebral artery: Secondary | ICD-10-CM | POA: Diagnosis not present

## 2017-05-01 DIAGNOSIS — B351 Tinea unguium: Secondary | ICD-10-CM

## 2017-05-01 DIAGNOSIS — I4891 Unspecified atrial fibrillation: Secondary | ICD-10-CM | POA: Diagnosis not present

## 2017-05-01 DIAGNOSIS — Z7901 Long term (current) use of anticoagulants: Secondary | ICD-10-CM | POA: Diagnosis not present

## 2017-05-01 DIAGNOSIS — I739 Peripheral vascular disease, unspecified: Secondary | ICD-10-CM

## 2017-05-01 LAB — POCT INR: INR: 2.7

## 2017-05-01 NOTE — Progress Notes (Signed)
Patient ID: BUCK MCAFFEE, male   DOB: 1925-08-28, 83 y.o.   MRN: [id number]  Complaint:  Visit Type: Patient returns to my office for continued preventative foot care services. Complaint: Patient states" my nails have grown long and thick and become painful to walk and wear shoes"  The patient presents for preventative foot care services. No changes to ROS  Podiatric Exam: Vascular: dorsalis pedis and posterior tibial pulses are palpable bilateral. Capillary return is immediate. Temperature gradient is WNL. Skin turgor WNL  Sensorium: Normal Semmes Weinstein monofilament test. Normal tactile sensation bilaterally. Nail Exam: Pt has thick disfigured discolored nails with subungual debris noted bilateral entire nail hallux through fifth toenails Ulcer Exam: There is no evidence of ulcer or pre-ulcerative changes or infection. Orthopedic Exam: Muscle tone and strength are WNL. No limitations in general ROM. No crepitus or effusions noted. Foot type and digits show no abnormalities. Bony prominences are unremarkable. Skin: No Porokeratosis. No infection or ulcers  Diagnosis:  Onychomycosis, , Pain in right toe, pain in left toes  Treatment & Plan Procedures and Treatment: Consent by patient was obtained for treatment procedures. The patient understood the discussion of treatment and procedures well. All questions were answered thoroughly reviewed. Debridement of mycotic and hypertrophic toenails, 1 through 5 bilateral and clearing of subungual debris. No ulceration, no infection noted.  Return Visit-Office Procedure: Patient instructed to return to the office for a follow up visit 3 months for continued evaluation and treatment.   Gardiner Barefoot DPM

## 2017-05-10 ENCOUNTER — Telehealth: Payer: Self-pay | Admitting: Internal Medicine

## 2017-05-10 ENCOUNTER — Telehealth: Payer: Self-pay | Admitting: Cardiology

## 2017-05-10 NOTE — Telephone Encounter (Signed)
Advised flu vaccines only done with visit but patient could go to PCP or pharmacy for vaccine

## 2017-05-10 NOTE — Telephone Encounter (Signed)
Pt's daughter called stating that the received the letter regarding [name] [name] leaving. She requested that he transfer to an MD if possible. Would you be able to see him as a new pt?

## 2017-05-10 NOTE — Telephone Encounter (Signed)
New Message  Pt daughter would like to know if pt can get flu shot on 10/9 with lab work. Please call back to discuss

## 2017-05-14 ENCOUNTER — Other Ambulatory Visit: Payer: Self-pay | Admitting: Family

## 2017-05-15 ENCOUNTER — Ambulatory Visit (INDEPENDENT_AMBULATORY_CARE_PROVIDER_SITE_OTHER): Payer: Medicare Other

## 2017-05-15 ENCOUNTER — Other Ambulatory Visit: Payer: Medicare Other

## 2017-05-15 DIAGNOSIS — Z23 Encounter for immunization: Secondary | ICD-10-CM

## 2017-05-15 DIAGNOSIS — I350 Nonrheumatic aortic (valve) stenosis: Secondary | ICD-10-CM

## 2017-05-16 LAB — BASIC METABOLIC PANEL
BUN/Creatinine Ratio: 21 (ref 10–24)
BUN: 38 mg/dL — AB (ref 8–27)
CALCIUM: 9.4 mg/dL (ref 8.6–10.2)
CO2: 25 mmol/L (ref 20–29)
Chloride: 102 mmol/L (ref 96–106)
Creatinine, Ser: 1.81 mg/dL — ABNORMAL HIGH (ref 0.76–1.27)
GFR, EST AFRICAN AMERICAN: 37 mL/min/{1.73_m2} — AB (ref >59)
GFR, EST NON AFRICAN AMERICAN: 32 mL/min/{1.73_m2} — AB (ref >59)
Glucose: 108 mg/dL — ABNORMAL HIGH (ref 65–99)
Potassium: 4.1 mmol/L (ref 3.5–5.2)
Sodium: 145 mmol/L — ABNORMAL HIGH (ref 134–144)

## 2017-06-05 ENCOUNTER — Ambulatory Visit (INDEPENDENT_AMBULATORY_CARE_PROVIDER_SITE_OTHER): Payer: Medicare Other | Admitting: Pharmacist

## 2017-06-05 DIAGNOSIS — I6350 Cerebral infarction due to unspecified occlusion or stenosis of unspecified cerebral artery: Secondary | ICD-10-CM | POA: Diagnosis not present

## 2017-06-05 DIAGNOSIS — Z7901 Long term (current) use of anticoagulants: Secondary | ICD-10-CM | POA: Diagnosis not present

## 2017-06-05 DIAGNOSIS — I4891 Unspecified atrial fibrillation: Secondary | ICD-10-CM | POA: Diagnosis not present

## 2017-06-05 LAB — POCT INR: INR: 2.6

## 2017-06-08 ENCOUNTER — Other Ambulatory Visit: Payer: Self-pay | Admitting: *Deleted

## 2017-06-08 MED ORDER — CLONIDINE HCL 0.2 MG PO TABS: 0.2000 mg | ORAL_TABLET | Freq: Two times a day (BID) | ORAL | 0 refills | Status: DC

## 2017-06-18 ENCOUNTER — Other Ambulatory Visit: Payer: Self-pay

## 2017-06-18 MED ORDER — ALLOPURINOL 100 MG PO TABS: ORAL_TABLET | ORAL | 0 refills | Status: DC

## 2017-06-26 IMAGING — DX DG CHEST 2V
2 series · 2 of 2 positions shown · non-contrast
Comparison: None.

CLINICAL DATA: Cough, congestion

EXAM:
CHEST  2 VIEW

[chest pa]
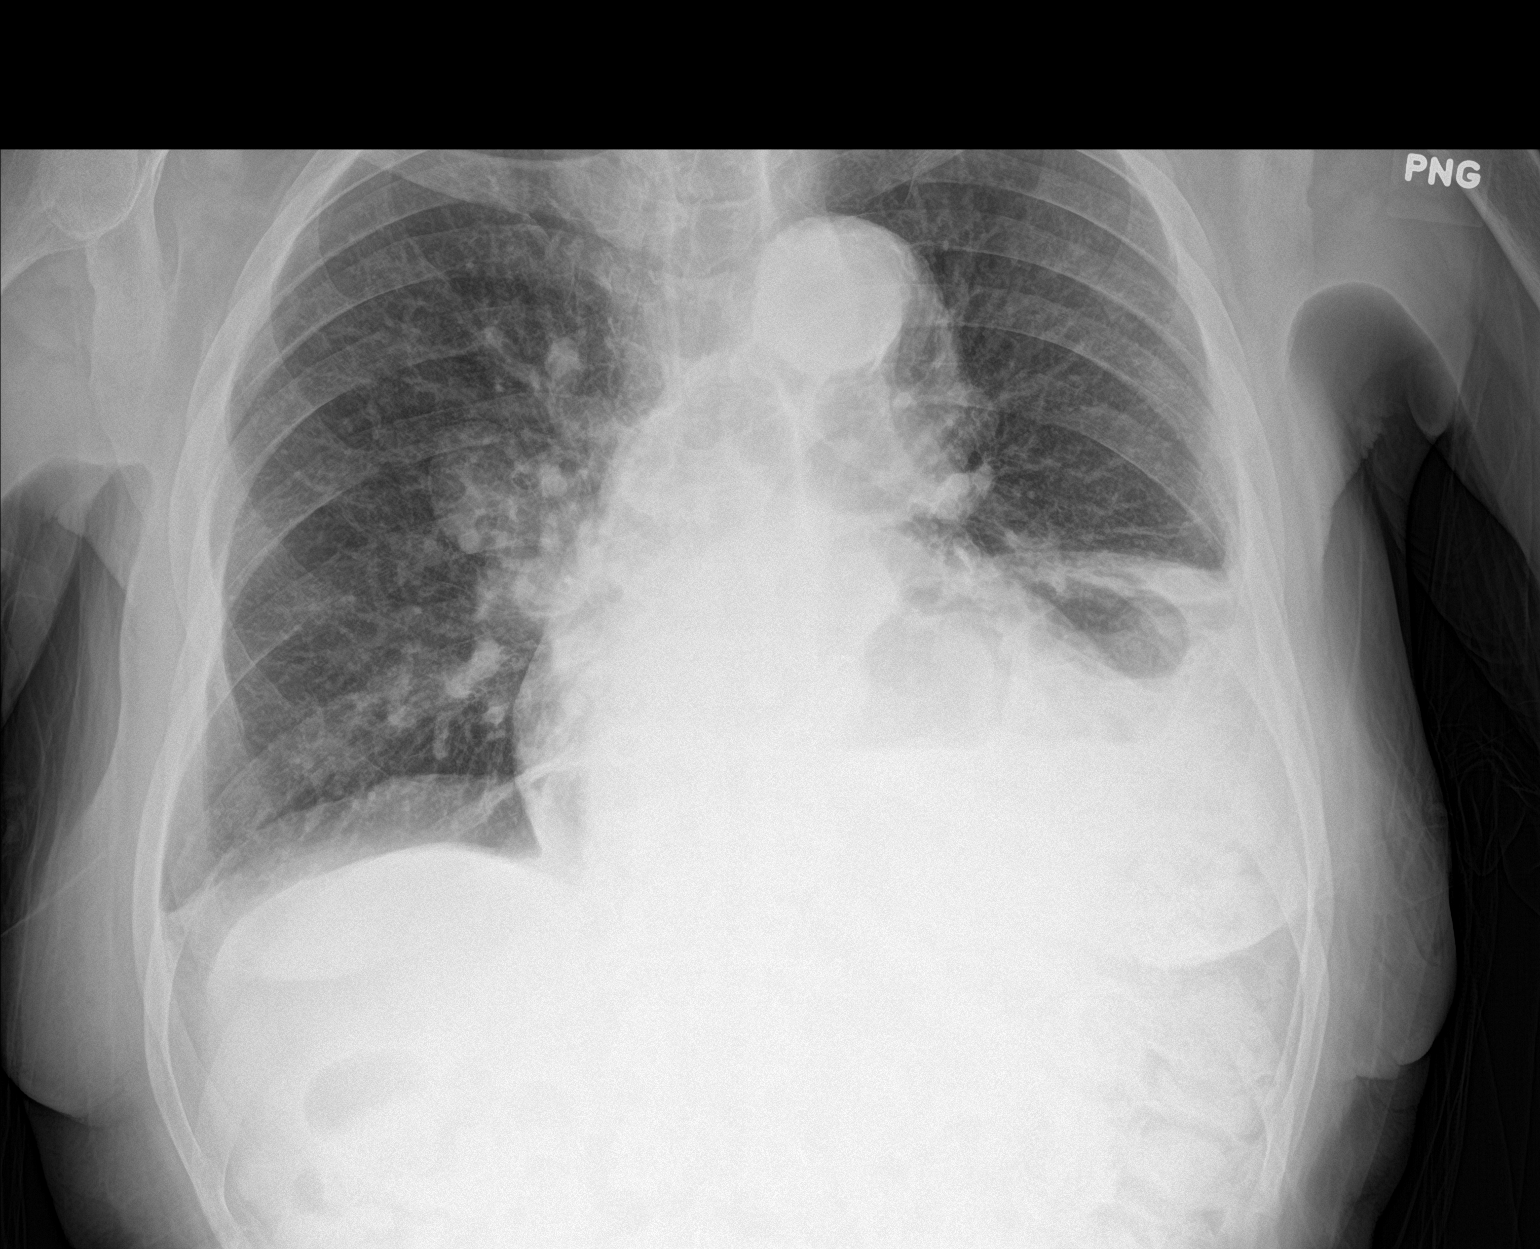

[chest lat]
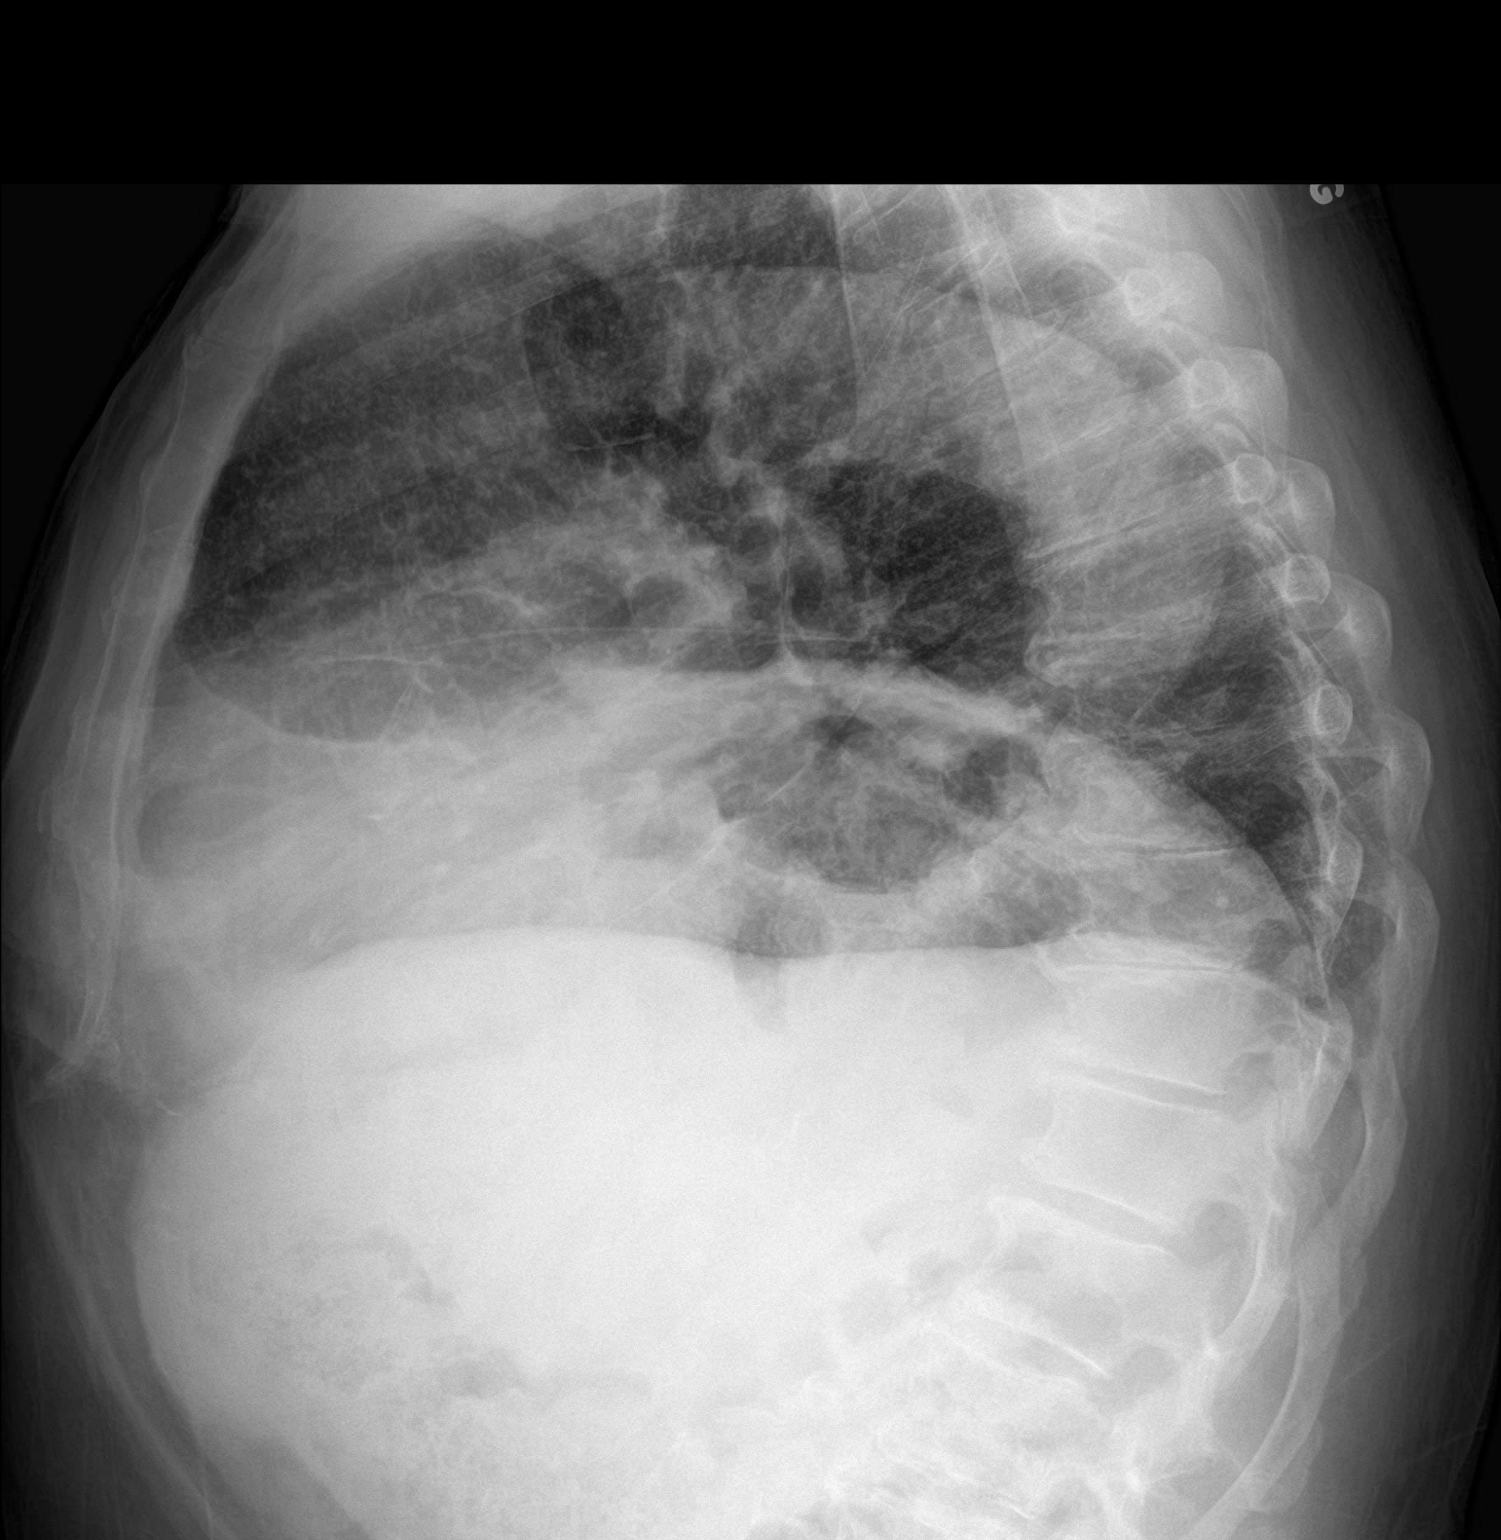

[2 of 2 positions shown; findings below may reference images not displayed]

FINDINGS: There is elevation of the left diaphragm. There is no focal
parenchymal opacity. There is no pleural effusion or pneumothorax.
The heart and mediastinal contours are unremarkable.

The osseous structures are unremarkable.
IMPRESSION: No active cardiopulmonary disease.

## 2017-07-13 ENCOUNTER — Telehealth: Payer: Self-pay | Admitting: Nurse Practitioner

## 2017-07-13 NOTE — Telephone Encounter (Signed)
Copied from [place] [phone] [id number]. Topic: Quick Communication - Rx Refill/Question >> Jul 13, 2017  3:15 PM Vernona Rieger wrote: Has the patient contacted their pharmacy? Yes & said no refills. Transferring care from [place] to [place]. Appt made for 09/17/17    Preferred Pharmacy (with phone number or street name): [facility] [place]   Agent: Please be advised that RX refills may take up to 3 business days. We ask that you follow-up with your pharmacy. REFILL CLONIDINE & ALLOPURINOL

## 2017-07-18 ENCOUNTER — Other Ambulatory Visit: Payer: Self-pay | Admitting: *Deleted

## 2017-07-18 MED ORDER — ALLOPURINOL 100 MG PO TABS: ORAL_TABLET | ORAL | 1 refills | Status: DC

## 2017-07-18 MED ORDER — CLONIDINE HCL 0.2 MG PO TABS: 0.2000 mg | ORAL_TABLET | Freq: Two times a day (BID) | ORAL | 1 refills | Status: DC

## 2017-07-20 ENCOUNTER — Encounter: Payer: Self-pay | Admitting: Cardiology

## 2017-07-23 NOTE — Progress Notes (Signed)
HPI The patient presents as followup of his multiple cardiovascular problems.  He has AS and has wanted conservative therapy.  He was referred to Dr. Burt Knack who also suggested that he was not a TAVR candidate and should have medical therapy given his multiple medical problems and advanced age.  Since that time he is done well.  He gets around with his walker in the house but he is fairly limited with his age and joint problems.  He wears his oxygen as needed.  Periodically his saturations will fall below 90 but comes up quickly with oxygen.  Describing any PND or orthopnea.  Not having any new palpitations, presyncope or syncope.  Is not having any chest pain.  Allergies  Allergen Reactions  . Ibuprofen     REACTION: causes low blood pressure    Current Outpatient Medications  Medication Sig Dispense Refill  . allopurinol (ZYLOPRIM) 100 MG tablet TAKE 1 TABLET(100 MG) BY MOUTH DAILY. Needs to establish with new PCP for more refills. 30 tablet 1  . atorvastatin (LIPITOR) 20 MG tablet TAKE 1 TABLET BY MOUTH DAILY 30 tablet 11  . bimatoprost (LUMIGAN) 0.01 % SOLN Place 1 drop into both eyes at bedtime.    . brinzolamide (AZOPT) 1 % ophthalmic suspension Place 1 drop into both eyes 2 (two) times daily.      . Cholecalciferol (VITAMIN D) 2000 UNITS CAPS Take 1 capsule by mouth daily.      . cloNIDine (CATAPRES) 0.2 MG tablet Take 1 tablet (0.2 mg total) by mouth 2 (two) times daily. Must make appt w/new provider for future refills 60 tablet 1  . eplerenone (INSPRA) 25 MG tablet TAKE 1 TABLET BY MOUTH EVERY DAY 90 tablet 3  . meclizine (ANTIVERT) 25 MG tablet Take 25 mg by mouth 3 (three) times daily as needed for dizziness.    . metoprolol succinate (TOPROL-XL) 100 MG 24 hr tablet TAKE 1 TABLET BY MOUTH DAILY WITH OR IMMEDIATELY FOLLOWING A MEAL 90 tablet 3  . pilocarpine (PILOCAR) 4 % ophthalmic solution Place 1 drop into the left eye 2 (two) times daily.     . potassium chloride SA  (K-DUR,KLOR-CON) 20 MEQ tablet TAKE 3 TABLETS BY MOUTH DAILY 270 tablet 3  . Tamsulosin HCl (FLOMAX) 0.4 MG CAPS Take 0.4 mg by mouth daily.      Marland Kitchen telmisartan (MICARDIS) 80 MG tablet Take 1 tablet (80 mg total) by mouth daily. 90 tablet 3  . torsemide (DEMADEX) 20 MG tablet Take 2 tablets (40 mg total) by mouth 2 (two) times daily. 120 tablet 11  . verapamil (CALAN-SR) 120 MG CR tablet TAKE 1 TABLET BY MOUTH EVERY NIGHT AT BEDTIME 30 tablet 11  . warfarin (COUMADIN) 5 MG tablet TAKE 1/2 TO 1 TABLET BY MOUTH DAILY AS DIRECTED BY COUMADIN CLINIC 60 tablet 1   No current facility-administered medications for this visit.     Past Medical History:  Diagnosis Date  . 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 Aortic stenosis   . Atrial fibrillation ([place])   . BPH (benign prostatic hypertrophy)   . Congestive heart failure, unspecified   . CVA (cerebral infarction)   . DJD (degenerative joint disease)   . Generalized osteoarthrosis, unspecified site   . Gout, unspecified   . Pulmonary hypertension ([place])   . Pure hypercholesterolemia   . Renal insufficiency   . Unspecified cerebral artery occlusion with cerebral infarction   . Unspecified essential hypertension   . Unspecified hemorrhoids  without mention of complication     Past Surgical History:  Procedure Laterality Date  . APPENDECTOMY    . CATARACT EXTRACTION    . cosmetic eye surgery    . INGUINAL HERNIA REPAIR      ROS:    As stated in the HPI and negative for all other systems.  PHYSICAL EXAM BP (!) 136/91   Pulse 69   Ht 5\' 11"  (1.803 m)   Wt 176 lb (79.8 kg)   BMI 24.55 kg/m   PHYSICAL EXAM GEN:  No distress NECK:  No jugular venous distention at 90 degrees, waveform within normal limits, carotid upstroke brisk and symmetric, no bruits, no thyromegaly LYMPHATICS:  No cervical adenopathy LUNGS:  Clear to auscultation bilaterally BACK:  No CVA tenderness CHEST:  Unremarkable HEART:  S1 and S2 within normal limits, no  S3, no S4, no clicks, no rubs, 3 out of 6 apical systolic murmur late peaking and radiating slightly at the aortic outflow tract, no diastolic murmurs ABD:  Positive bowel sounds normal in frequency in pitch, no bruits, no rebound, no guarding, unable to assess midline mass or bruit with the patient seated. EXT:  2 plus pulses throughout, no edema, no cyanosis no clubbing SKIN:  No rashes no nodules NEURO:  Cranial nerves II through XII grossly intact, motor grossly intact throughout PSYCH:  Cognitively intact, oriented to person place and time    EKG: NA   ASSESSMENT AND PLAN  AORTIC STENOSIS -  The patient will be managed conservatively.  Tolerating meds as listed though I will check a basic metabolic profile today to make sure he is on a reasonable dose of diuretic without exacerbating his known.    ATRIAL FIBRILLATION -  The patient tolerates this rhythm with rate control and anticoagulation. He has no desire to switch to one of the newer agents.  Mr. DRAYDON CLAIRMONT has a CHA2DS2 - VASc score of 6.    HYPERTENSION -  The blood pressure is OK.  No change in therapy.   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    CKD - I will check a BMET.

## 2017-07-26 ENCOUNTER — Encounter: Payer: Self-pay | Admitting: Cardiology

## 2017-07-26 ENCOUNTER — Ambulatory Visit (INDEPENDENT_AMBULATORY_CARE_PROVIDER_SITE_OTHER): Payer: Medicare Other | Admitting: Pharmacist Clinician (PhC)/ Clinical Pharmacy Specialist

## 2017-07-26 ENCOUNTER — Ambulatory Visit: Payer: Medicare Other | Admitting: Cardiology

## 2017-07-26 VITALS — BP 136/91 | HR 69 | Ht 71.0 in | Wt 176.0 lb

## 2017-07-26 DIAGNOSIS — Z7901 Long term (current) use of anticoagulants: Secondary | ICD-10-CM

## 2017-07-26 DIAGNOSIS — I350 Nonrheumatic aortic (valve) stenosis: Secondary | ICD-10-CM | POA: Diagnosis not present

## 2017-07-26 DIAGNOSIS — I6350 Cerebral infarction due to unspecified occlusion or stenosis of unspecified cerebral artery: Secondary | ICD-10-CM

## 2017-07-26 DIAGNOSIS — I5032 Chronic diastolic (congestive) heart failure: Secondary | ICD-10-CM

## 2017-07-26 DIAGNOSIS — I4891 Unspecified atrial fibrillation: Secondary | ICD-10-CM

## 2017-07-26 LAB — POCT INR: INR: 2.6

## 2017-07-26 NOTE — Patient Instructions (Signed)
Medication Instructions:  Your physician recommends that you continue on your current medications as directed. Please refer to the Current Medication list given to you today.  Labwork: Your physician recommends that you return for lab work in: BMET-TODAY  Testing/Procedures: None   Follow-Up: Your physician wants you to follow-up in: [place]. You will receive a reminder letter in the mail two months in advance. If you don't receive a letter, please call our office to schedule the follow-up appointment.  Any Other Special Instructions Will Be Listed Below (If Applicable).  If you need a refill on your cardiac medications before your next appointment, please call your pharmacy.

## 2017-07-27 LAB — BASIC METABOLIC PANEL
BUN/Creatinine Ratio: 22 (ref 10–24)
BUN: 45 mg/dL — AB (ref 8–27)
CALCIUM: 9.4 mg/dL (ref 8.6–10.2)
CHLORIDE: 104 mmol/L (ref 96–106)
CO2: 25 mmol/L (ref 20–29)
Creatinine, Ser: 2.04 mg/dL — ABNORMAL HIGH (ref 0.76–1.27)
GFR calc non Af Amer: 28 mL/min/{1.73_m2} — ABNORMAL LOW (ref >59)
GFR, EST AFRICAN AMERICAN: 32 mL/min/{1.73_m2} — AB (ref >59)
Glucose: 100 mg/dL — ABNORMAL HIGH (ref 65–99)
Potassium: 4.8 mmol/L (ref 3.5–5.2)
Sodium: 142 mmol/L (ref 134–144)

## 2017-08-01 ENCOUNTER — Ambulatory Visit: Payer: Medicare Other | Admitting: Podiatry

## 2017-08-01 DIAGNOSIS — B351 Tinea unguium: Secondary | ICD-10-CM

## 2017-08-01 DIAGNOSIS — D689 Coagulation defect, unspecified: Secondary | ICD-10-CM

## 2017-08-01 NOTE — Progress Notes (Signed)
Patient ID: [name] [name], male   DOB: [birth year], 83 y.o.   MRN: [id number]  Complaint:  Visit Type: Patient returns to my office for continued preventative foot care services. Complaint: Patient states" my nails have grown long and thick and become painful to walk and wear shoes"  The patient presents for preventative foot care services. No changes to ROS.  Patient is taking coumadin.  Podiatric Exam: Vascular: dorsalis pedis and posterior tibial pulses are palpable bilateral. Capillary return is immediate. Temperature gradient is WNL. Skin turgor WNL  Sensorium: Normal Semmes Weinstein monofilament test. Normal tactile sensation bilaterally. Nail Exam: Pt has thick disfigured discolored nails with subungual debris noted bilateral entire nail hallux through fifth toenails Ulcer Exam: There is no evidence of ulcer or pre-ulcerative changes or infection. Orthopedic Exam: Muscle tone and strength are WNL. No limitations in general ROM. No crepitus or effusions noted. Foot type and digits show no abnormalities. Bony prominences are unremarkable. Skin: No Porokeratosis. No infection or ulcers  Diagnosis:  Onychomycosis, , Pain in right toe, pain in left toes  Treatment & Plan Procedures and Treatment: Consent by patient was obtained for treatment procedures. The patient understood the discussion of treatment and procedures well. All questions were answered thoroughly reviewed. Debridement of mycotic and hypertrophic toenails, 1 through 5 bilateral and clearing of subungual debris. No ulceration, no infection noted.  Return Visit-Office Procedure: Patient instructed to return to the office for a follow up visit 3 months for continued evaluation and treatment.   Gardiner Barefoot DPM

## 2017-08-02 ENCOUNTER — Telehealth: Payer: Self-pay | Admitting: *Deleted

## 2017-08-02 DIAGNOSIS — Z79899 Other long term (current) drug therapy: Secondary | ICD-10-CM

## 2017-08-02 NOTE — Telephone Encounter (Signed)
BMP ordered

## 2017-08-02 NOTE — Telephone Encounter (Signed)
-----   Message from Minus Breeding, MD sent at 07/27/2017  7:44 AM EST ----- Creat is up.  I would like to have another BMET next week to make sure it is not trending up further.  Needs current level of diuretic.

## 2017-08-15 LAB — BASIC METABOLIC PANEL
BUN/Creatinine Ratio: 22 (ref 10–24)
BUN: 43 mg/dL — ABNORMAL HIGH (ref 8–27)
CALCIUM: 9.6 mg/dL (ref 8.6–10.2)
CO2: 22 mmol/L (ref 20–29)
CREATININE: 1.92 mg/dL — AB (ref 0.76–1.27)
Chloride: 105 mmol/L (ref 96–106)
GFR calc Af Amer: 35 mL/min/{1.73_m2} — ABNORMAL LOW (ref >59)
GFR, EST NON AFRICAN AMERICAN: 30 mL/min/{1.73_m2} — AB (ref >59)
GLUCOSE: 90 mg/dL (ref 65–99)
POTASSIUM: 4.5 mmol/L (ref 3.5–5.2)
Sodium: 143 mmol/L (ref 134–144)

## 2017-08-29 ENCOUNTER — Other Ambulatory Visit: Payer: Self-pay

## 2017-08-29 MED ORDER — CLONIDINE HCL 0.2 MG PO TABS: 0.2000 mg | ORAL_TABLET | Freq: Two times a day (BID) | ORAL | 0 refills | Status: DC

## 2017-09-04 ENCOUNTER — Other Ambulatory Visit: Payer: Self-pay | Admitting: Cardiology

## 2017-09-04 NOTE — Telephone Encounter (Signed)
Rx(s) sent to pharmacy electronically.  

## 2017-09-05 ENCOUNTER — Telehealth: Payer: Self-pay | Admitting: Nurse Practitioner

## 2017-09-05 DIAGNOSIS — N289 Disorder of kidney and ureter, unspecified: Secondary | ICD-10-CM

## 2017-09-05 NOTE — Telephone Encounter (Signed)
It looks like [name] [name] is establishing with me on 2/11. His heart doctor forwarded me recent lab work that showed his kindey function is decreased. He should see a kidney doctor for this. I have placed a referral to nephrology for him.

## 2017-09-05 NOTE — Telephone Encounter (Signed)
-----   Message from Fidel [name], RN sent at 08/17/2017  1:52 PM EST ----- [facility]

## 2017-09-06 NOTE — Telephone Encounter (Signed)
Pts daughter is aware of response below.

## 2017-09-07 ENCOUNTER — Telehealth: Payer: Self-pay | Admitting: Pulmonary Disease

## 2017-09-07 MED ORDER — NEOMYCIN-POLYMYXIN-HC 1 % OT SOLN: 3.0000 [drp] | Freq: Three times a day (TID) | OTIC | 0 refills | Status: DC

## 2017-09-07 NOTE — Telephone Encounter (Signed)
Called pt who stated he has been having pain in his right ear x3 days now and is wanting to know if anything can be sent in to help with the pain.  Dr. Lenna Gilford, please advise. Thanks!

## 2017-09-07 NOTE — Telephone Encounter (Signed)
Pt called and given recommendations.

## 2017-09-10 NOTE — Telephone Encounter (Signed)
Per SN: cortisporin 3 drops into right ear TID  Nothing further needed.

## 2017-09-11 ENCOUNTER — Ambulatory Visit (INDEPENDENT_AMBULATORY_CARE_PROVIDER_SITE_OTHER): Payer: Medicare Other | Admitting: *Deleted

## 2017-09-11 DIAGNOSIS — I4891 Unspecified atrial fibrillation: Secondary | ICD-10-CM

## 2017-09-11 DIAGNOSIS — Z7901 Long term (current) use of anticoagulants: Secondary | ICD-10-CM | POA: Diagnosis not present

## 2017-09-11 DIAGNOSIS — I6350 Cerebral infarction due to unspecified occlusion or stenosis of unspecified cerebral artery: Secondary | ICD-10-CM | POA: Diagnosis not present

## 2017-09-11 DIAGNOSIS — Z5181 Encounter for therapeutic drug level monitoring: Secondary | ICD-10-CM | POA: Diagnosis not present

## 2017-09-11 LAB — POCT INR: INR: 3.9

## 2017-09-11 NOTE — Patient Instructions (Signed)
Description   Do not take coumadin today Feb 5th then  continue taking  same dosage 1/2 tablet daily except 1 tablet  on Mondays and Fridays. Recheck in 2 weeks.  Do extra serving of greens today and tomorrow

## 2017-09-17 ENCOUNTER — Ambulatory Visit (INDEPENDENT_AMBULATORY_CARE_PROVIDER_SITE_OTHER): Payer: Medicare Other | Admitting: Nurse Practitioner

## 2017-09-17 ENCOUNTER — Encounter: Payer: Self-pay | Admitting: Nurse Practitioner

## 2017-09-17 ENCOUNTER — Telehealth: Payer: Self-pay

## 2017-09-17 VITALS — BP 116/74 | HR 82 | Temp 97.7°F | Resp 16 | Ht 71.0 in | Wt 180.0 lb

## 2017-09-17 DIAGNOSIS — H9201 Otalgia, right ear: Secondary | ICD-10-CM | POA: Diagnosis not present

## 2017-09-17 DIAGNOSIS — H6121 Impacted cerumen, right ear: Secondary | ICD-10-CM | POA: Diagnosis not present

## 2017-09-17 NOTE — Telephone Encounter (Signed)
Pt was with [name] [name]. Would like to see a MD. He is seeing Hollie Beach today but him and his daughter did not realize she was a NP. Would you be ok to accept him as a new patient? Please advise

## 2017-09-17 NOTE — Patient Instructions (Addendum)
Please follow up for worsening symptoms or if your symptoms dont get better.  It was nice to meet you. Thanks for letting me take care of you today :)  Otitis Externa Otitis externa is an infection of the outer ear canal. The outer ear canal is the area between the outside of the ear and the eardrum. Otitis externa is sometimes called "swimmer's ear." Follow these instructions at home:  If you were given antibiotic ear drops, use them as told by your doctor. Do not stop using them even if your condition gets better.  Take over-the-counter and prescription medicines only as told by your doctor.  Keep all follow-up visits as told by your doctor. This is important. How is this prevented?  Keep your ear dry. Use the corner of a towel to dry your ear after you swim or bathe.  Try not to scratch or put things in your ear. Doing these things makes it easier for germs to grow in your ear.  Avoid swimming in lakes, dirty water, or pools that may not have the right amount of a chemical called chlorine.  Consider making ear drops and putting 3 or 4 drops in each ear after you swim. Ask your doctor about how you can make ear drops. Contact a doctor if:  You have a fever.  After 3 days your ear is still red, swollen, or painful.  After 3 days you still have pus coming from your ear.  Your redness, swelling, or pain gets worse.  You have a really bad headache.  You have redness, swelling, pain, or tenderness behind your ear. This information is not intended to replace advice given to you by your health care provider. Make sure you discuss any questions you have with your health care provider. Document Released: 01/10/2008 Document Revised: 08/19/2015 Document Reviewed: 05/03/2015 Elsevier Interactive Patient Education  2018 [place], Adult The ears produce a substance called earwax that helps keep bacteria out of the ear and protects the skin in the ear canal.  Occasionally, earwax can build up in the ear and cause discomfort or hearing loss. What increases the risk? This condition is more likely to develop in people who:  Are male.  Are elderly.  Naturally produce more earwax.  Clean their ears often with cotton swabs.  Use earplugs often.  Use in-ear headphones often.  Wear hearing aids.  Have narrow ear canals.  Have earwax that is overly thick or sticky.  Have eczema.  Are dehydrated.  Have excess hair in the ear canal.  What are the signs or symptoms? Symptoms of this condition include:  Reduced or muffled hearing.  A feeling of fullness in the ear or feeling that the ear is plugged.  Fluid coming from the ear.  Ear pain.  Ear itch.  Ringing in the ear.  Coughing.  An obvious piece of earwax that can be seen inside the ear canal.  How is this diagnosed? This condition may be diagnosed based on:  Your symptoms.  Your medical history.  An ear exam. During the exam, your health care provider will look into your ear with an instrument called an otoscope.  You may have tests, including a hearing test. How is this treated? This condition may be treated by:  Using ear drops to soften the earwax.  Having the earwax removed by a health care provider. The health care provider may: ? Flush the ear with water. ? Use an instrument that has  a loop on the end (curette). ? Use a suction device.  Surgery to remove the wax buildup. This may be done in severe cases.  Follow these instructions at home:  Take over-the-counter and prescription medicines only as told by your health care provider.  Do not put any objects, including cotton swabs, into your ear. You can clean the opening of your ear canal with a washcloth or facial tissue.  Follow instructions from your health care provider about cleaning your ears. Do not over-clean your ears.  Drink enough fluid to keep your urine clear or pale yellow. This will  help to thin the earwax.  Keep all follow-up visits as told by your health care provider. If earwax builds up in your ears often or if you use hearing aids, consider seeing your health care provider for routine, preventive ear cleanings. Ask your health care provider how often you should schedule your cleanings.  If you have hearing aids, clean them according to instructions from the manufacturer and your health care provider. Contact a health care provider if:  You have ear pain.  You develop a fever.  You have blood, pus, or other fluid coming from your ear.  You have hearing loss.  You have ringing in your ears that does not go away.  Your symptoms do not improve with treatment.  You feel like the room is spinning (vertigo). Summary  Earwax can build up in the ear and cause discomfort or hearing loss.  The most common symptoms of this condition include reduced or muffled hearing and a feeling of fullness in the ear or feeling that the ear is plugged.  This condition may be diagnosed based on your symptoms, your medical history, and an ear exam.  This condition may be treated by using ear drops to soften the earwax or by having the earwax removed by a health care provider.  Do not put any objects, including cotton swabs, into your ear. You can clean the opening of your ear canal with a washcloth or facial tissue. This information is not intended to replace advice given to you by your health care provider. Make sure you discuss any questions you have with your health care provider. Document Released: 08/31/2004 Document Revised: 10/04/2016 Document Reviewed: 10/04/2016 Elsevier Interactive Patient Education  [employer].

## 2017-09-17 NOTE — Progress Notes (Signed)
Name: [name] [name]   MRN: [id number]    DOB: 1926/07/01   Date:09/17/2017       Progress Note  Subjective  Chief Complaint [name] [name] is a 84 yo male who is coming in today to establish care with me. He is coming in with his daughter. His daughter actually says she would prefer for her father to see an MD and asks if an MD will be able to follow her father. She requests that I only see him today for an ear problem.  HPI  Ear pain- This is a new problem. He began to complain of the ear pain about 1 week ago. Dr [name] [name], his pulmonologist, sent him a prescription for coritsoprin drops on 2/1 which he has been using with improvement in the pain but he continues to have decreased hearing. He says the ear "sounds muffled." He says the ear has been draining wax, but denies bleeding or watery drainage. He denies fevers, chills, dizziness, headaches, vision changes, nasal congestion, nausea, vomiting.   Patient Active Problem List   Diagnosis Date Noted  . Medicare annual wellness visit, subsequent 11/10/2016  . Routine adult health maintenance 11/10/2016  . Dyspnea 11/16/2015  . Lipoma of neck 06/15/2015  . Back pain 06/15/2015  . Lightheadedness 03/04/2015  . Glaucoma 01/14/2013  . Long term current use of anticoagulant 09/26/2010  . CHRONIC CORONARY ISCHEMIA 02/01/2010  . GYNECOMASTIA 11/15/2009  . DIZZINESS 07/13/2009  . Pulmonary hypertension ([place]) 09/25/2007  . GOUT 07/25/2007  . ANEURYSM OF PULMONARY ARTERY 07/25/2007  . Aortic valve disorder 07/25/2007  . ATRIAL FIBRILLATION 07/25/2007  . HEMORRHOIDS 07/25/2007  . BENIGN PROSTATIC HYPERTROPHY, HX OF 07/25/2007  . INGUINAL HERNIA 07/24/2007  . HYPERCHOLESTEROLEMIA 06/01/2007  . Essential hypertension 06/01/2007  . Congestive heart failure ([place]) 06/01/2007  . CVA 06/01/2007  . Disorder resulting from impaired renal function 06/01/2007  . Osteoarthritis 06/01/2007    Past Surgical History:  Procedure Laterality Date  .  APPENDECTOMY    . CATARACT EXTRACTION    . cosmetic eye surgery    . INGUINAL HERNIA REPAIR      History reviewed. No pertinent family history.  Social History   Socioeconomic History  . Marital status: Married    Spouse name: Not on file  . Number of children: 5  . Years of education: 10  . Highest education level: Not on file  Social Needs  . Financial resource strain: Not on file  . Food insecurity - worry: Not on file  . Food insecurity - inability: Not on file  . Transportation needs - medical: Not on file  . Transportation needs - non-medical: Not on file  Occupational History  . Occupation: Retired  Tobacco Use  . Smoking status: Former Smoker    Years: 3.00    Types: Cigarettes, Cigars    Last attempt to quit: 12/05/2003    Years since quitting: 13.7  . Smokeless tobacco: Never Used  . Tobacco comment: does not smoke cigars anymore  Substance and Sexual Activity  . Alcohol use: Not on file    Comment: Very rarely  . Drug use: No  . Sexual activity: Not on file  Other Topics Concern  . Not on file  Social History Narrative   [name] [name]: Stay home; sitting on the porch   Denies religious beliefs effecting health care     Current Outpatient Medications:  .  allopurinol (ZYLOPRIM) 100 MG tablet, TAKE 1 TABLET(100 MG) BY MOUTH DAILY. Needs to establish with  new PCP for more refills., Disp: 30 tablet, Rfl: 1 .  atorvastatin (LIPITOR) 20 MG tablet, TAKE 1 TABLET BY MOUTH DAILY, Disp: 30 tablet, Rfl: 11 .  bimatoprost (LUMIGAN) 0.01 % SOLN, Place 1 drop into both eyes at bedtime., Disp: , Rfl:  .  brinzolamide (AZOPT) 1 % ophthalmic suspension, Place 1 drop into both eyes 2 (two) times daily.  , Disp: , Rfl:  .  Cholecalciferol (VITAMIN D) 2000 UNITS CAPS, Take 1 capsule by mouth daily.  , Disp: , Rfl:  .  cloNIDine (CATAPRES) 0.2 MG tablet, Take 1 tablet (0.2 mg total) by mouth 2 (two) times daily. Must make appt w/new provider for future refills, Disp: 60 tablet, Rfl:  0 .  eplerenone (INSPRA) 25 MG tablet, TAKE 1 TABLET BY MOUTH EVERY DAY, Disp: 90 tablet, Rfl: 3 .  meclizine (ANTIVERT) 25 MG tablet, Take 25 mg by mouth 3 (three) times daily as needed for dizziness., Disp: , Rfl:  .  metoprolol succinate (TOPROL-XL) 100 MG 24 hr tablet, TAKE 1 TABLET BY MOUTH DAILY WITH OR IMMEDIATELY FOLLOWING A MEAL, Disp: 90 tablet, Rfl: 3 .  NEOMYCIN-POLYMYXIN-HYDROCORTISONE (CORTISPORIN) 1 % SOLN OTIC solution, Place 3 drops into the right ear 3 (three) times daily., Disp: 10 mL, Rfl: 0 .  pilocarpine (PILOCAR) 4 % ophthalmic solution, Place 1 drop into the left eye 2 (two) times daily. , Disp: , Rfl:  .  potassium chloride SA (K-DUR,KLOR-CON) 20 MEQ tablet, TAKE 3 TABLETS BY MOUTH DAILY, Disp: 270 tablet, Rfl: 3 .  Tamsulosin HCl (FLOMAX) 0.4 MG CAPS, Take 0.4 mg by mouth daily.  , Disp: , Rfl:  .  telmisartan (MICARDIS) 80 MG tablet, Take 1 tablet (80 mg total) by mouth daily., Disp: 90 tablet, Rfl: 3 .  torsemide (DEMADEX) 20 MG tablet, Take 2 tablets (40 mg total) by mouth 2 (two) times daily., Disp: 120 tablet, Rfl: 11 .  verapamil (CALAN-SR) 120 MG CR tablet, TAKE 1 TABLET BY MOUTH EVERY NIGHT AT BEDTIME, Disp: 30 tablet, Rfl: 11 .  warfarin (COUMADIN) 5 MG tablet, TAKE 1/2 TO 1 TABLET BY MOUTH DAILY AS DIRECTED BY COUMADIN CLINIC, Disp: 60 tablet, Rfl: 1  Allergies  Allergen Reactions  . Ibuprofen     REACTION: causes low blood pressure     ROS See HPI  Objective  Vitals:   09/17/17 1355  BP: 116/74  Pulse: 82  Resp: 16  Temp: 97.7 F (36.5 C)  TempSrc: Oral  SpO2: 91%  Weight: 180 lb (81.6 kg)  Height: 5\' 11"  (1.803 m)   Body mass index is 25.1 kg/m.  Physical Exam Vital signs reviewed. Constitutional: Patient appears well-developed and well-nourished. No distress.  HENT: Head: Normocephalic and atraumatic. Ears: Left TM without erythyma or effusion, scant cerumen; Right ear canal erythematous with cerumen impaction cleared by irrigation;  Nose: Nose normal. Mouth/Throat: Oropharynx is clear and moist. No oropharyngeal exudate.  Eyes: Conjunctivae are normal.  No scleral icterus.  Neck: Normal range of motion. Neck supple.  Cardiovascular: Normal rate, regular rhythm. Distal pulses intact. Pulmonary/Chest: Effort normal. No respiratory distress. Neurological: He is alert and oriented. Coordination, balance, speech and gait are normal.  Skin: Skin is warm and dry. No rash noted. No erythema.  Psychiatric: Patient has a normal mood and affect. behavior is normal. Judgment and thought content normal.   Assessment & Plan Daughter requesting transfer to another MD in clinic today. I evaluated the patient for an acute complaint  -Reviewed Health Maintenance:  up to date  Impacted cerumen of right ear, Right ear pain Irrigation in clinic today with removal of ear wax, patient tolerated well Instructed to continue cortisporin drops and follow up for persistent/worsening symptoms -See AVS for education provided to patient

## 2017-09-17 NOTE — Telephone Encounter (Signed)
Okay with me 

## 2017-09-17 NOTE — Telephone Encounter (Signed)
Ok with me 

## 2017-09-18 ENCOUNTER — Telehealth: Payer: Self-pay | Admitting: Nurse Practitioner

## 2017-09-18 MED ORDER — ALLOPURINOL 100 MG PO TABS: ORAL_TABLET | ORAL | 2 refills | Status: DC

## 2017-09-18 NOTE — Telephone Encounter (Signed)
Reviewed chart pt is up-to-date sent refills to [facility].../lmb  

## 2017-09-18 NOTE — Telephone Encounter (Signed)
LVM letting pt know that Dr. Jenny Reichmann has accepted him as a new pt and to set up an [place] appointment with Dr. Jenny Reichmann at his convenience.

## 2017-09-18 NOTE — Telephone Encounter (Signed)
Copied from [place]. Topic: Quick Communication - Rx Refill/Question >> Sep 18, 2017 11:12 AM Margot Ables wrote: Medication: allopurinol (ZYLOPRIM) 100 MG tablet - pt has a few days left Has the patient contacted their pharmacy? Yes.  It was declined but pt saw provider 09/17/17 (Agent: If no, request that the patient contact the pharmacy for the refill.) Preferred Pharmacy (with phone number or street name): [facility] [place], [place] [phone] (Phone) [phone] (Fax)  Agent: Please be advised that RX refills may take up to 3 business days. We ask that you follow-up with your pharmacy.

## 2017-09-19 ENCOUNTER — Telehealth: Payer: Self-pay | Admitting: Nurse Practitioner

## 2017-09-19 NOTE — Telephone Encounter (Signed)
Sent referral to [facility] and Dr. Moshe Cipro suggests the pt stop taking micardis/telmisartan

## 2017-09-21 NOTE — Telephone Encounter (Signed)
Dr. Jenny Reichmann, I wanted you to see this as FYI. This patient is transferring to you and does not want to see me anymore. Thanks, [employer]

## 2017-09-25 ENCOUNTER — Ambulatory Visit (INDEPENDENT_AMBULATORY_CARE_PROVIDER_SITE_OTHER): Payer: Medicare Other | Admitting: Pharmacist

## 2017-09-25 DIAGNOSIS — I6350 Cerebral infarction due to unspecified occlusion or stenosis of unspecified cerebral artery: Secondary | ICD-10-CM | POA: Diagnosis not present

## 2017-09-25 DIAGNOSIS — Z7901 Long term (current) use of anticoagulants: Secondary | ICD-10-CM | POA: Diagnosis not present

## 2017-09-25 DIAGNOSIS — I4891 Unspecified atrial fibrillation: Secondary | ICD-10-CM

## 2017-09-25 LAB — POCT INR: INR: 3.2

## 2017-09-25 NOTE — Patient Instructions (Signed)
Description   Do not take coumadin today then continue taking same dosage 1/2 tablet daily except 1 tablet  on Mondays and Fridays. Recheck in 2 weeks.

## 2017-09-29 ENCOUNTER — Other Ambulatory Visit: Payer: Self-pay | Admitting: Cardiology

## 2017-10-01 ENCOUNTER — Other Ambulatory Visit: Payer: Self-pay

## 2017-10-01 MED ORDER — TELMISARTAN 80 MG PO TABS: 80.0000 mg | ORAL_TABLET | Freq: Every day | ORAL | 3 refills | Status: DC

## 2017-10-02 ENCOUNTER — Ambulatory Visit (INDEPENDENT_AMBULATORY_CARE_PROVIDER_SITE_OTHER): Payer: Medicare Other | Admitting: Internal Medicine

## 2017-10-02 ENCOUNTER — Other Ambulatory Visit (INDEPENDENT_AMBULATORY_CARE_PROVIDER_SITE_OTHER): Payer: Medicare Other

## 2017-10-02 ENCOUNTER — Other Ambulatory Visit: Payer: Self-pay

## 2017-10-02 ENCOUNTER — Encounter: Payer: Self-pay | Admitting: Internal Medicine

## 2017-10-02 VITALS — BP 112/76 | HR 85 | Temp 97.6°F | Ht 71.0 in | Wt 179.0 lb

## 2017-10-02 DIAGNOSIS — R739 Hyperglycemia, unspecified: Secondary | ICD-10-CM

## 2017-10-02 DIAGNOSIS — N183 Chronic kidney disease, stage 3 unspecified: Secondary | ICD-10-CM

## 2017-10-02 DIAGNOSIS — Z0000 Encounter for general adult medical examination without abnormal findings: Secondary | ICD-10-CM

## 2017-10-02 DIAGNOSIS — Z0001 Encounter for general adult medical examination with abnormal findings: Secondary | ICD-10-CM | POA: Diagnosis not present

## 2017-10-02 DIAGNOSIS — H6121 Impacted cerumen, right ear: Secondary | ICD-10-CM | POA: Diagnosis not present

## 2017-10-02 LAB — CBC WITH DIFFERENTIAL/PLATELET
BASOS PCT: 0.9 % (ref 0.0–3.0)
Basophils Absolute: 0.1 10*3/uL (ref 0.0–0.1)
EOS PCT: 2.4 % (ref 0.0–5.0)
Eosinophils Absolute: 0.2 10*3/uL (ref 0.0–0.7)
HEMATOCRIT: 43.8 % (ref 39.0–52.0)
HEMOGLOBIN: 14.2 g/dL (ref 13.0–17.0)
LYMPHS PCT: 16.7 % (ref 12.0–46.0)
Lymphs Abs: 1.2 10*3/uL (ref 0.7–4.0)
MCHC: 32.5 g/dL (ref 30.0–36.0)
MCV: 102.8 fl — ABNORMAL HIGH (ref 78.0–100.0)
Monocytes Absolute: 0.5 10*3/uL (ref 0.1–1.0)
Monocytes Relative: 6.9 % (ref 3.0–12.0)
NEUTROS ABS: 5.2 10*3/uL (ref 1.4–7.7)
Neutrophils Relative %: 73.1 % (ref 43.0–77.0)
PLATELETS: 203 10*3/uL (ref 150.0–400.0)
RBC: 4.26 Mil/uL (ref 4.22–5.81)
RDW: 16.7 % — AB (ref 11.5–15.5)
WBC: 7.1 10*3/uL (ref 4.0–10.5)

## 2017-10-02 LAB — LIPID PANEL
CHOLESTEROL: 141 mg/dL (ref 0–200)
HDL: 55 mg/dL (ref >39.00)
LDL Cholesterol: 71 mg/dL (ref 0–99)
NonHDL: 85.59
TRIGLYCERIDES: 71 mg/dL (ref 0.0–149.0)
Total CHOL/HDL Ratio: 3
VLDL: 14.2 mg/dL (ref 0.0–40.0)

## 2017-10-02 LAB — HEPATIC FUNCTION PANEL
ALBUMIN: 4.1 g/dL (ref 3.5–5.2)
ALT: 9 U/L (ref 0–53)
AST: 14 U/L (ref 0–37)
Alkaline Phosphatase: 153 U/L — ABNORMAL HIGH (ref 39–117)
Bilirubin, Direct: 0.2 mg/dL (ref 0.0–0.3)
TOTAL PROTEIN: 7.9 g/dL (ref 6.0–8.3)
Total Bilirubin: 0.8 mg/dL (ref 0.2–1.2)

## 2017-10-02 LAB — BASIC METABOLIC PANEL
BUN: 41 mg/dL — AB (ref 6–23)
CHLORIDE: 102 meq/L (ref 96–112)
CO2: 27 mEq/L (ref 19–32)
Calcium: 10.2 mg/dL (ref 8.4–10.5)
Creatinine, Ser: 2.05 mg/dL — ABNORMAL HIGH (ref 0.40–1.50)
GFR: 39.42 mL/min — AB (ref >60.00)
Glucose, Bld: 109 mg/dL — ABNORMAL HIGH (ref 70–99)
POTASSIUM: 4.3 meq/L (ref 3.5–5.1)
SODIUM: 140 meq/L (ref 135–145)

## 2017-10-02 LAB — TSH: TSH: 3.96 u[IU]/mL (ref 0.35–4.50)

## 2017-10-02 LAB — HEMOGLOBIN A1C: HEMOGLOBIN A1C: 6.5 % (ref 4.6–6.5)

## 2017-10-02 MED ORDER — CLONIDINE HCL 0.2 MG PO TABS: 0.2000 mg | ORAL_TABLET | Freq: Two times a day (BID) | ORAL | 0 refills | Status: DC

## 2017-10-02 NOTE — Assessment & Plan Note (Signed)
stable overall by history and exam, recent data reviewed with pt, and pt to continue medical treatment as before,  to f/u any worsening symptoms or concerns, for f/u lab today 

## 2017-10-02 NOTE — Assessment & Plan Note (Signed)

## 2017-10-02 NOTE — Assessment & Plan Note (Signed)
stable overall by history and exam, recent data reviewed with pt, and pt to continue medical treatment as before,  to f/u any worsening symptoms or concerns Lab Results  Component Value Date   HGBA1C 6.5 10/02/2017

## 2017-10-02 NOTE — Patient Instructions (Addendum)
Your right ear was irrigated of wax today  Please continue all other medications as before, and refills have been done if requested.  Please have the pharmacy call with any other refills you may need.  Please continue your efforts at being more active, low cholesterol diet, and weight control.  You are otherwise up to date with prevention measures today.  Please keep your appointments with your specialists as you may have planned  Please go to the LAB in the Basement (turn left off the elevator) for the tests to be done today  You will be contacted by phone if any changes need to be made immediately.  Otherwise, you will receive a letter about your results with an explanation, but please check with MyChart first.  Please remember to sign up for MyChart if you have not done so, as this will be important to you in the future with finding out test results, communicating by private email, and scheduling acute appointments online when needed.  Please return in 6 months, or sooner if needed

## 2017-10-02 NOTE — Progress Notes (Signed)
Subjective:    Patient ID: [name] [name], male    DOB: 18-Nov-1925, 84 y.o.   MRN: [id number]  HPI  Here with daughter for yearly f/u;  Overall doing ok;  Pt denies Chest pain, worsening SOB, DOE, wheezing, orthopnea, PND, worsening LE edema, palpitations, dizziness or syncope.  Pt denies neurological change such as new headache, facial or extremity weakness.  Pt denies polydipsia, polyuria, or low sugar symptoms. Pt states overall good compliance with treatment and medications, good tolerability, and has been trying to follow appropriate diet.  Pt denies worsening depressive symptoms, suicidal ideation or panic. No fever, night sweats, wt loss, loss of appetite, or other constitutional symptoms.  Pt states good ability with ADL's, has low fall risk, home safety reviewed and adequate, no other significant changes in vision but has recent right hearing loss - ? wax, and not active with exercise.  Lives alone but doing ok with residual right low vision (100% left vision loss). Walks with cane.  Has ongoing Left ear , nose and scalp seborrhea, has OTC Nizoral.  No other new complaints or interval hx Past Medical History:  Diagnosis Date  . 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 Aortic stenosis   . Atrial fibrillation ([place])   . BPH (benign prostatic hypertrophy)   . Congestive heart failure, unspecified   . CVA (cerebral infarction)   . DJD (degenerative joint disease)   . Generalized osteoarthrosis, unspecified site   . Gout, unspecified   . Pulmonary hypertension ([place])   . Pure hypercholesterolemia   . Renal insufficiency   . Unspecified cerebral artery occlusion with cerebral infarction   . Unspecified essential hypertension   . Unspecified hemorrhoids without mention of complication    Past Surgical History:  Procedure Laterality Date  . APPENDECTOMY    . CATARACT EXTRACTION    . cosmetic eye surgery    . INGUINAL HERNIA REPAIR      reports that he quit smoking about 13 years ago.  His smoking use included cigarettes and cigars. He quit after 3.00 years of use. he has never used smokeless tobacco. He reports that he does not use drugs. His alcohol history is not on file. family history is not on file. Allergies  Allergen Reactions  . Ibuprofen     REACTION: causes low blood pressure   Current Outpatient Medications on File Prior to Visit  Medication Sig Dispense Refill  . allopurinol (ZYLOPRIM) 100 MG tablet TAKE 1 TABLET(100 MG) BY MOUTH DAILY. 30 tablet 2  . atorvastatin (LIPITOR) 20 MG tablet TAKE 1 TABLET BY MOUTH DAILY 30 tablet 11  . bimatoprost (LUMIGAN) 0.01 % SOLN Place 1 drop into both eyes at bedtime.    . brinzolamide (AZOPT) 1 % ophthalmic suspension Place 1 drop into both eyes 2 (two) times daily.      . Cholecalciferol (VITAMIN D) 2000 UNITS CAPS Take 1 capsule by mouth daily.      Marland Kitchen eplerenone (INSPRA) 25 MG tablet TAKE 1 TABLET BY MOUTH EVERY DAY 90 tablet 3  . meclizine (ANTIVERT) 25 MG tablet Take 25 mg by mouth 3 (three) times daily as needed for dizziness.    . metoprolol succinate (TOPROL-XL) 100 MG 24 hr tablet TAKE 1 TABLET BY MOUTH DAILY WITH OR IMMEDIATELY FOLLOWING A MEAL 90 tablet 3  . NEOMYCIN-POLYMYXIN-HYDROCORTISONE (CORTISPORIN) 1 % SOLN OTIC solution Place 3 drops into the right ear 3 (three) times daily. 10 mL 0  . pilocarpine (PILOCAR) 4 % ophthalmic  solution Place 1 drop into the left eye 2 (two) times daily.     . potassium chloride SA (K-DUR,KLOR-CON) 20 MEQ tablet TAKE 3 TABLETS BY MOUTH DAILY 270 tablet 2  . Tamsulosin HCl (FLOMAX) 0.4 MG CAPS Take 0.4 mg by mouth daily.      Marland Kitchen telmisartan (MICARDIS) 80 MG tablet Take 1 tablet (80 mg total) by mouth daily. 90 tablet 3  . torsemide (DEMADEX) 20 MG tablet Take 2 tablets (40 mg total) by mouth 2 (two) times daily. 120 tablet 11  . verapamil (CALAN-SR) 120 MG CR tablet TAKE 1 TABLET BY MOUTH EVERY NIGHT AT BEDTIME 30 tablet 11  . warfarin (COUMADIN) 5 MG tablet TAKE 1/2 TO 1 TABLET BY  MOUTH DAILY AS DIRECTED BY COUMADIN CLINIC 60 tablet 1   No current facility-administered medications on file prior to visit.    Review of Systems Constitutional: Negative for other unusual diaphoresis, sweats, appetite or weight changes HENT: Negative for other worsening hearing loss, ear pain, facial swelling, mouth sores or neck stiffness.   Eyes: Negative for other worsening pain, redness or other visual disturbance.  Respiratory: Negative for other stridor or swelling Cardiovascular: Negative for other palpitations or other chest pain  Gastrointestinal: Negative for worsening diarrhea or loose stools, blood in stool, distention or other pain Genitourinary: Negative for hematuria, flank pain or other change in urine volume.  Musculoskeletal: Negative for myalgias or other joint swelling.  Skin: Negative for other color change, or other wound or worsening drainage.  Neurological: Negative for other syncope or numbness. Hematological: Negative for other adenopathy or swelling Psychiatric/Behavioral: Negative for hallucinations, other worsening agitation, SI, self-injury, or new decreased concentration All other system neg per pt    Objective:   Physical Exam BP 112/76   Pulse 85   Temp 97.6 F (36.4 C) (Oral)   Ht 5\' 11"  (1.803 m)   Wt 179 lb (81.2 kg)   SpO2 95%   BMI 24.97 kg/m  VS noted,  Constitutional: Pt is oriented to person, place, and time. Appears well-developed and well-nourished, in no significant distress and comfortable Head: Normocephalic and atraumatic  Eyes: Conjunctivae and EOM are normal. Pupils are equal, round, and reactive to light Right Ear: External ear normal without discharge, right wax impaction resolved with irrigation and hearing improved Left Ear: External ear normal without discharge Nose: Nose without discharge or deformity Mouth/Throat: Oropharynx is without other ulcerations and moist  Neck: Normal range of motion. Neck supple. No JVD present.  No tracheal deviation present or significant neck LA or mass Cardiovascular: Normal rate, regular rhythm, normal heart sounds and intact distal pulses.   Pulmonary/Chest: WOB normal and breath sounds without rales or wheezing  Abdominal: Soft. Bowel sounds are normal. NT. No HSM  Musculoskeletal: Normal range of motion. Exhibits no edema Lymphadenopathy: Has no other cervical adenopathy.  Neurological: Pt is alert and oriented to person, place, and time. Pt has normal reflexes. No cranial nerve deficit. Motor grossly intact, Gait intact Skin: Skin is warm and dry. No rash noted or new ulcerations Psychiatric:  Has normal mood and affect. Behavior is normal without agitation No other exam findings       Assessment & Plan:

## 2017-10-05 ENCOUNTER — Other Ambulatory Visit: Payer: Self-pay | Admitting: Cardiology

## 2017-10-09 ENCOUNTER — Ambulatory Visit (INDEPENDENT_AMBULATORY_CARE_PROVIDER_SITE_OTHER): Payer: Medicare Other | Admitting: *Deleted

## 2017-10-09 ENCOUNTER — Other Ambulatory Visit: Payer: Self-pay | Admitting: *Deleted

## 2017-10-09 DIAGNOSIS — I4891 Unspecified atrial fibrillation: Secondary | ICD-10-CM | POA: Diagnosis not present

## 2017-10-09 DIAGNOSIS — Z7901 Long term (current) use of anticoagulants: Secondary | ICD-10-CM | POA: Diagnosis not present

## 2017-10-09 DIAGNOSIS — I6350 Cerebral infarction due to unspecified occlusion or stenosis of unspecified cerebral artery: Secondary | ICD-10-CM

## 2017-10-09 LAB — POCT INR: INR: 2.3

## 2017-10-09 MED ORDER — ATORVASTATIN CALCIUM 20 MG PO TABS: 20.0000 mg | ORAL_TABLET | Freq: Every day | ORAL | 5 refills | Status: DC

## 2017-10-09 NOTE — Patient Instructions (Signed)
Description   Continue taking same dosage 1/2 tablet daily except 1 tablet on Mondays and Fridays. Recheck in 3 weeks.

## 2017-10-22 ENCOUNTER — Telehealth: Payer: Self-pay | Admitting: Nurse Practitioner

## 2017-10-22 NOTE — Telephone Encounter (Signed)
Called and LVM to inform patient he will need to call back and set up an appointment.   JUST AN FYI!

## 2017-10-22 NOTE — Telephone Encounter (Signed)
Copied from [place] [phone]. Topic: Quick Communication - See Telephone Encounter >> Oct 22, 2017 11:17 AM Cleaster Corin, NT wrote: CRM for notification. See Telephone encounter for:   10/22/17. Pt. Calling to see if he can get steroid patch.  [facility] [address], [place] [address] [place] [address] Phone: [phone] Fax: [phone]

## 2017-10-22 NOTE — Telephone Encounter (Signed)
Pt will need OV to be evaluated for rx steroid patches. pls call pt make next available appt../lm,b

## 2017-10-27 ENCOUNTER — Other Ambulatory Visit: Payer: Self-pay | Admitting: Cardiology

## 2017-10-29 ENCOUNTER — Other Ambulatory Visit: Payer: Self-pay

## 2017-10-29 MED ORDER — EPLERENONE 25 MG PO TABS: 25.0000 mg | ORAL_TABLET | Freq: Every day | ORAL | 2 refills | Status: DC

## 2017-10-30 ENCOUNTER — Encounter: Payer: Self-pay | Admitting: Podiatry

## 2017-10-30 ENCOUNTER — Ambulatory Visit: Payer: Medicare Other | Admitting: Podiatry

## 2017-10-30 ENCOUNTER — Ambulatory Visit (INDEPENDENT_AMBULATORY_CARE_PROVIDER_SITE_OTHER): Payer: Medicare Other | Admitting: *Deleted

## 2017-10-30 DIAGNOSIS — I4891 Unspecified atrial fibrillation: Secondary | ICD-10-CM

## 2017-10-30 DIAGNOSIS — I739 Peripheral vascular disease, unspecified: Secondary | ICD-10-CM | POA: Diagnosis not present

## 2017-10-30 DIAGNOSIS — I6350 Cerebral infarction due to unspecified occlusion or stenosis of unspecified cerebral artery: Secondary | ICD-10-CM | POA: Diagnosis not present

## 2017-10-30 DIAGNOSIS — D689 Coagulation defect, unspecified: Secondary | ICD-10-CM

## 2017-10-30 DIAGNOSIS — B351 Tinea unguium: Secondary | ICD-10-CM

## 2017-10-30 DIAGNOSIS — Z7901 Long term (current) use of anticoagulants: Secondary | ICD-10-CM

## 2017-10-30 LAB — POCT INR: INR: 2.3

## 2017-10-30 NOTE — Progress Notes (Signed)
Patient ID: [name] [name], male   DOB: 01/04/1926, 84 y.o.   MRN: [id number]  Complaint:  Visit Type: Patient returns to my office for continued preventative foot care services. Complaint: Patient states" my nails have grown long and thick and become painful to walk and wear shoes"  The patient presents for preventative foot care services. No changes to ROS.  Patient is taking coumadin.  Podiatric Exam: Vascular: dorsalis pedis and posterior tibial pulses are palpable bilateral. Capillary return is immediate. Temperature gradient is WNL. Skin turgor WNL  Sensorium: Normal Semmes Weinstein monofilament test. Normal tactile sensation bilaterally. Nail Exam: Pt has thick disfigured discolored nails with subungual debris noted bilateral entire nail hallux through fifth toenails Ulcer Exam: There is no evidence of ulcer or pre-ulcerative changes or infection. Orthopedic Exam: Muscle tone and strength are WNL. No limitations in general ROM. No crepitus or effusions noted. Foot type and digits show no abnormalities. Bony prominences are unremarkable. Skin: No Porokeratosis. No infection or ulcers  Diagnosis:  Onychomycosis, , Pain in right toe, pain in left toes  Treatment & Plan Procedures and Treatment: Consent by patient was obtained for treatment procedures. The patient understood the discussion of treatment and procedures well. All questions were answered thoroughly reviewed. Debridement of mycotic and hypertrophic toenails, 1 through 5 bilateral and clearing of subungual debris. No ulceration, no infection noted.  Return Visit-Office Procedure: Patient instructed to return to the office for a follow up visit 3 months for continued evaluation and treatment.   Jeanine Caven DPM 

## 2017-10-30 NOTE — Patient Instructions (Signed)
Description   Continue taking same dosage 1/2 tablet daily except 1 tablet on Mondays and Fridays. Recheck in 4 weeks.

## 2017-11-04 ENCOUNTER — Other Ambulatory Visit: Payer: Self-pay | Admitting: Cardiology

## 2017-11-05 ENCOUNTER — Other Ambulatory Visit: Payer: Self-pay | Admitting: Cardiology

## 2017-11-05 NOTE — Telephone Encounter (Signed)
REFILL 

## 2017-11-05 NOTE — Telephone Encounter (Signed)
Rx request sent to pharmacy.  

## 2017-11-12 ENCOUNTER — Other Ambulatory Visit: Payer: Self-pay | Admitting: Cardiology

## 2017-12-11 ENCOUNTER — Ambulatory Visit (INDEPENDENT_AMBULATORY_CARE_PROVIDER_SITE_OTHER): Payer: Medicare Other | Admitting: *Deleted

## 2017-12-11 DIAGNOSIS — I4891 Unspecified atrial fibrillation: Secondary | ICD-10-CM

## 2017-12-11 DIAGNOSIS — Z7901 Long term (current) use of anticoagulants: Secondary | ICD-10-CM | POA: Diagnosis not present

## 2017-12-11 DIAGNOSIS — I6350 Cerebral infarction due to unspecified occlusion or stenosis of unspecified cerebral artery: Secondary | ICD-10-CM | POA: Diagnosis not present

## 2017-12-11 LAB — POCT INR: INR: 2.1

## 2017-12-11 NOTE — Patient Instructions (Signed)
Description   Continue taking same dosage 1/2 tablet daily except 1 tablet on Mondays and Fridays. Recheck in 4 weeks.

## 2017-12-13 ENCOUNTER — Other Ambulatory Visit: Payer: Self-pay

## 2017-12-13 MED ORDER — ALLOPURINOL 100 MG PO TABS: ORAL_TABLET | ORAL | 2 refills | Status: DC

## 2017-12-28 ENCOUNTER — Other Ambulatory Visit: Payer: Self-pay | Admitting: Nephrology

## 2017-12-28 DIAGNOSIS — N184 Chronic kidney disease, stage 4 (severe): Secondary | ICD-10-CM

## 2018-01-01 ENCOUNTER — Other Ambulatory Visit: Payer: Self-pay

## 2018-01-01 MED ORDER — CLONIDINE HCL 0.2 MG PO TABS: 0.2000 mg | ORAL_TABLET | Freq: Two times a day (BID) | ORAL | 0 refills | Status: DC

## 2018-01-08 ENCOUNTER — Ambulatory Visit (INDEPENDENT_AMBULATORY_CARE_PROVIDER_SITE_OTHER): Payer: Medicare Other | Admitting: *Deleted

## 2018-01-08 DIAGNOSIS — I4891 Unspecified atrial fibrillation: Secondary | ICD-10-CM

## 2018-01-08 DIAGNOSIS — I6350 Cerebral infarction due to unspecified occlusion or stenosis of unspecified cerebral artery: Secondary | ICD-10-CM | POA: Diagnosis not present

## 2018-01-08 DIAGNOSIS — Z7901 Long term (current) use of anticoagulants: Secondary | ICD-10-CM

## 2018-01-08 LAB — POCT INR: INR: 2.3 (ref 2.0–3.0)

## 2018-01-08 NOTE — Patient Instructions (Signed)
Description   Continue taking same dosage 1/2 tablet daily except 1 tablet on Mondays and Fridays. Recheck in 6 weeks. Call us with medication changes or concerns # [phone].

## 2018-01-14 NOTE — Progress Notes (Signed)
HPI The patient presents as followup of his multiple cardiovascular problems.  He has AS and has wanted conservative therapy.  He was referred to Dr. Burt Knack who also suggested that he was not a TAVR candidate and should have medical therapy given his multiple medical problems and advanced age.  He returns for follow up.  He is doing very slowly at home.  He uses a walker.  He has oxygen which she wears most of the time.  He does get very short of breath with any physical activity.  He is not describing resting PND or orthopnea.  Is not really noticing palpitations, presyncope or syncope.  He has had no new chest pain.  He is increasingly weak and frail.  He does very little activity around the house.  His daughter takes care of him.    Allergies  Allergen Reactions  . Ibuprofen     REACTION: causes low blood pressure    Current Outpatient Medications  Medication Sig Dispense Refill  . allopurinol (ZYLOPRIM) 100 MG tablet TAKE 1 TABLET(100 MG) BY MOUTH DAILY. 30 tablet 2  . atorvastatin (LIPITOR) 20 MG tablet Take 1 tablet (20 mg total) by mouth daily. 30 tablet 5  . bimatoprost (LUMIGAN) 0.01 % SOLN Place 1 drop into both eyes at bedtime.    . brinzolamide (AZOPT) 1 % ophthalmic suspension Place 1 drop into both eyes 2 (two) times daily.      . Cholecalciferol (VITAMIN D) 2000 UNITS CAPS Take 1 capsule by mouth daily.      . cloNIDine (CATAPRES) 0.2 MG tablet Take 1 tablet (0.2 mg total) by mouth 2 (two) times daily. (Patient taking differently: Take 0.2 mg by mouth 2 (two) times daily. Taking 1/2 tablet twice a day) 180 tablet 0  . eplerenone (INSPRA) 25 MG tablet Take 1 tablet (25 mg total) by mouth daily. 90 tablet 2  . meclizine (ANTIVERT) 25 MG tablet Take 25 mg by mouth 3 (three) times daily as needed for dizziness.    . metoprolol succinate (TOPROL-XL) 100 MG 24 hr tablet TAKE 1 TABLET BY MOUTH DAILY WITH OR IMMEDIATELY FOLLOWING A MEAL 90 tablet 3  .  NEOMYCIN-POLYMYXIN-HYDROCORTISONE (CORTISPORIN) 1 % SOLN OTIC solution Place 3 drops into the right ear 3 (three) times daily. 10 mL 0  . pilocarpine (PILOCAR) 4 % ophthalmic solution Place 1 drop into the left eye 2 (two) times daily.     . potassium chloride SA (K-DUR,KLOR-CON) 20 MEQ tablet TAKE 3 TABLETS BY MOUTH DAILY (Patient taking differently: TAKE 2 TABLETS BY MOUTH DAILY) 270 tablet 2  . Tamsulosin HCl (FLOMAX) 0.4 MG CAPS Take 0.4 mg by mouth daily.      Marland Kitchen telmisartan (MICARDIS) 80 MG tablet Take 1 tablet (80 mg total) by mouth daily. 90 tablet 3  . torsemide (DEMADEX) 20 MG tablet Take 2 tablets (40 mg total) by mouth 2 (two) times daily. 120 tablet 11  . verapamil (CALAN-SR) 120 MG CR tablet TAKE 1 TABLET(120 MG) BY MOUTH AT BEDTIME 90 tablet 1  . warfarin (COUMADIN) 5 MG tablet TAKE 1/2 TO 1 TABLET BY MOUTH DAILY AS DIRECTED BY COUMADIN CLINIC 60 tablet 1   No current facility-administered medications for this visit.     Past Medical History:  Diagnosis Date  . 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 Aortic stenosis   . Atrial fibrillation ([place])   . BPH (benign prostatic hypertrophy)   . Congestive heart failure, unspecified   . CVA (  cerebral infarction)   . DJD (degenerative joint disease)   . Generalized osteoarthrosis, unspecified site   . Gout, unspecified   . Pulmonary hypertension ([place])   . Pure hypercholesterolemia   . Renal insufficiency   . Unspecified cerebral artery occlusion with cerebral infarction   . Unspecified essential hypertension   . Unspecified hemorrhoids without mention of complication     Past Surgical History:  Procedure Laterality Date  . APPENDECTOMY    . CATARACT EXTRACTION    . cosmetic eye surgery    . INGUINAL HERNIA REPAIR      ROS:   Positive for itching skin. Otherwise as stated in the HPI and negative for all other systems.  PHYSICAL EXAM BP (!) 151/92   Pulse 72   Wt 170 lb 9.6 oz (77.4 kg)   BMI 23.79 kg/m   GENERAL:   Very frail appearing NECK:  No jugular venous distention at 90 degrees, waveform within normal limits, carotid upstroke brisk and symmetric, no bruits, no thyromegaly LUNGS:  Clear to auscultation bilaterally BACK:  No CVA tenderness CHEST:  Unremarkable HEART:  S1 and S2 within normal limits, no S3, no clicks, no rubs, 3 of 6 apical systolic murmur radiating up the aortic outflow tract, no diastolic murmurs, irregular ABD:  Positive bowel sounds normal in frequency in pitch, no bruits, no rebound, no guarding, unable to assess midline mass or bruit with the patient seated. EXT:  2 plus pulses throughout, no edema, no cyanosis no clubbing     EKG:  NA   ASSESSMENT AND PLAN  AORTIC STENOSIS -  The patient will be managed conservatively.  I do not hear a lot of fluid today.  Regardless I do not think he tolerate higher diuretics.  At this point is comfort measures.  No further imaging is indicated.  ATRIAL FIBRILLATION -    Mr. [name] [name] has a CHA2DS2 - VASc score of 6.  The patient  tolerates this rhythm and rate control and anticoagulation. We will continue with the meds as listed.  HYPERTENSION -  The blood pressure is OK.  No change in therapy.   PULMONARY HYPERTENSION -  No change in therapy.   CKD - This is follow by Biagio Borg, MD.  He is actually having a renal ultrasound tomorrow.

## 2018-01-15 ENCOUNTER — Ambulatory Visit: Payer: Medicare Other | Admitting: Cardiology

## 2018-01-15 ENCOUNTER — Encounter: Payer: Self-pay | Admitting: Cardiology

## 2018-01-15 VITALS — BP 151/92 | HR 72 | Wt 170.6 lb

## 2018-01-15 DIAGNOSIS — I350 Nonrheumatic aortic (valve) stenosis: Secondary | ICD-10-CM

## 2018-01-15 DIAGNOSIS — I482 Chronic atrial fibrillation, unspecified: Secondary | ICD-10-CM

## 2018-01-15 DIAGNOSIS — I272 Pulmonary hypertension, unspecified: Secondary | ICD-10-CM | POA: Diagnosis not present

## 2018-01-15 NOTE — Patient Instructions (Signed)
Medication Instructions:  Continue current medications  If you need a refill on your cardiac medications before your next appointment, please call your pharmacy.  Labwork: None Ordered   Testing/Procedures: None Ordered  Follow-Up: Your physician wants you to follow-up in: 1 Year. You should receive a reminder letter in the mail two months in advance. If you do not receive a letter, please call our office [phone].    Thank you for choosing [facility]!!      

## 2018-01-17 ENCOUNTER — Telehealth: Payer: Self-pay | Admitting: *Deleted

## 2018-01-17 NOTE — Telephone Encounter (Signed)
Order faxed to [facility] for wheelchair and walker.

## 2018-01-18 ENCOUNTER — Other Ambulatory Visit: Payer: Medicare Other

## 2018-01-18 ENCOUNTER — Ambulatory Visit
Admission: RE | Admit: 2018-01-18 | Discharge: 2018-01-18 | Disposition: A | Discharge status: Home / Self Care | Payer: Medicare Other | Source: Ambulatory Visit | Attending: Nephrology | Admitting: Nephrology

## 2018-01-18 DIAGNOSIS — N184 Chronic kidney disease, stage 4 (severe): Secondary | ICD-10-CM

## 2018-01-29 ENCOUNTER — Ambulatory Visit (INDEPENDENT_AMBULATORY_CARE_PROVIDER_SITE_OTHER): Payer: Medicare Other | Admitting: Podiatry

## 2018-01-29 ENCOUNTER — Encounter: Payer: Self-pay | Admitting: Podiatry

## 2018-01-29 DIAGNOSIS — D689 Coagulation defect, unspecified: Secondary | ICD-10-CM

## 2018-01-29 DIAGNOSIS — I739 Peripheral vascular disease, unspecified: Secondary | ICD-10-CM

## 2018-01-29 DIAGNOSIS — B351 Tinea unguium: Secondary | ICD-10-CM

## 2018-01-29 NOTE — Progress Notes (Signed)
Patient ID: [name] [name], male   DOB: [birth year], 84 y.o.   MRN: [id number]  Complaint:  Visit Type: Patient returns to my office for continued preventative foot care services. Complaint: Patient states" my nails have grown long and thick and become painful to walk and wear shoes"  The patient presents for preventative foot care services. No changes to ROS.  Patient is taking coumadin.  Podiatric Exam: Vascular: dorsalis pedis and posterior tibial pulses are palpable bilateral. Capillary return is immediate. Temperature gradient is WNL. Skin turgor WNL  Sensorium: Normal Semmes Weinstein monofilament test. Normal tactile sensation bilaterally. Nail Exam: Pt has thick disfigured discolored nails with subungual debris noted bilateral entire nail hallux through fifth toenails Ulcer Exam: There is no evidence of ulcer or pre-ulcerative changes or infection. Orthopedic Exam: Muscle tone and strength are WNL. No limitations in general ROM. No crepitus or effusions noted. Foot type and digits show no abnormalities. Bony prominences are unremarkable. Skin: No Porokeratosis. No infection or ulcers  Diagnosis:  Onychomycosis, , Pain in right toe, pain in left toes  Treatment & Plan Procedures and Treatment: Consent by patient was obtained for treatment procedures. The patient understood the discussion of treatment and procedures well. All questions were answered thoroughly reviewed. Debridement of mycotic and hypertrophic toenails, 1 through 5 bilateral and clearing of subungual debris. No ulceration, no infection noted.  Return Visit-Office Procedure: Patient instructed to return to the office for a follow up visit 3 months for continued evaluation and treatment.   Gardiner Barefoot DPM

## 2018-02-06 ENCOUNTER — Telehealth: Payer: Self-pay | Admitting: Pulmonary Disease

## 2018-02-06 NOTE — Telephone Encounter (Signed)
Called and spoke to pt.  Pt is requesting Rx for POC, as his current tanks are too heavy. It does not appear that SN originally prescribed oxygen. DME is [facility] per pt.  Pt has been scheduled for past due 4 week f/u on 02/12/18 at 12:00.  SN please advise if okay to eval for POC. Thanks  Current Outpatient Medications on File Prior to Visit  Medication Sig Dispense Refill  . allopurinol (ZYLOPRIM) 100 MG tablet TAKE 1 TABLET(100 MG) BY MOUTH DAILY. 30 tablet 2  . atorvastatin (LIPITOR) 20 MG tablet Take 1 tablet (20 mg total) by mouth daily. 30 tablet 5  . bimatoprost (LUMIGAN) 0.01 % SOLN Place 1 drop into both eyes at bedtime.    . brinzolamide (AZOPT) 1 % ophthalmic suspension Place 1 drop into both eyes 2 (two) times daily.      . Cholecalciferol (VITAMIN D) 2000 UNITS CAPS Take 1 capsule by mouth daily.      . cloNIDine (CATAPRES) 0.2 MG tablet Take 1 tablet (0.2 mg total) by mouth 2 (two) times daily. (Patient taking differently: Take 0.2 mg by mouth 2 (two) times daily. Taking 1/2 tablet twice a day) 180 tablet 0  . eplerenone (INSPRA) 25 MG tablet Take 1 tablet (25 mg total) by mouth daily. 90 tablet 2  . meclizine (ANTIVERT) 25 MG tablet Take 25 mg by mouth 3 (three) times daily as needed for dizziness.    . metoprolol succinate (TOPROL-XL) 100 MG 24 hr tablet TAKE 1 TABLET BY MOUTH DAILY WITH OR IMMEDIATELY FOLLOWING A MEAL 90 tablet 3  . NEOMYCIN-POLYMYXIN-HYDROCORTISONE (CORTISPORIN) 1 % SOLN OTIC solution Place 3 drops into the right ear 3 (three) times daily. 10 mL 0  . pilocarpine (PILOCAR) 4 % ophthalmic solution Place 1 drop into the left eye 2 (two) times daily.     . potassium chloride SA (K-DUR,KLOR-CON) 20 MEQ tablet TAKE 3 TABLETS BY MOUTH DAILY (Patient taking differently: TAKE 2 TABLETS BY MOUTH DAILY) 270 tablet 2  . Tamsulosin HCl (FLOMAX) 0.4 MG CAPS Take 0.4 mg by mouth daily.      Marland Kitchen telmisartan (MICARDIS) 80 MG tablet Take 1 tablet (80 mg total) by mouth daily. 90  tablet 3  . torsemide (DEMADEX) 20 MG tablet Take 2 tablets (40 mg total) by mouth 2 (two) times daily. 120 tablet 11  . verapamil (CALAN-SR) 120 MG CR tablet TAKE 1 TABLET(120 MG) BY MOUTH AT BEDTIME 90 tablet 1  . warfarin (COUMADIN) 5 MG tablet TAKE 1/2 TO 1 TABLET BY MOUTH DAILY AS DIRECTED BY COUMADIN CLINIC 60 tablet 1   No current facility-administered medications on file prior to visit.     Allergies  Allergen Reactions  . Ibuprofen     REACTION: causes low blood pressure

## 2018-02-06 NOTE — Telephone Encounter (Signed)
Per SN- we will discuss at [place], 02/12/18 at 1200.  Called and spoke with Patient. Explained that will try a POC qualifying walk at next OV. Patient stated understanding. Nothing further at this time.

## 2018-02-12 ENCOUNTER — Encounter: Payer: Self-pay | Admitting: Pulmonary Disease

## 2018-02-12 ENCOUNTER — Ambulatory Visit: Payer: Medicare Other | Admitting: Pulmonary Disease

## 2018-02-12 VITALS — BP 124/74 | HR 87 | Temp 98.1°F | Ht 71.0 in | Wt 168.8 lb

## 2018-02-12 DIAGNOSIS — I281 Aneurysm of pulmonary artery: Secondary | ICD-10-CM

## 2018-02-12 DIAGNOSIS — I10 Essential (primary) hypertension: Secondary | ICD-10-CM | POA: Diagnosis not present

## 2018-02-12 DIAGNOSIS — R0600 Dyspnea, unspecified: Secondary | ICD-10-CM | POA: Diagnosis not present

## 2018-02-12 DIAGNOSIS — M150 Primary generalized (osteo)arthritis: Secondary | ICD-10-CM

## 2018-02-12 DIAGNOSIS — I481 Persistent atrial fibrillation: Secondary | ICD-10-CM

## 2018-02-12 DIAGNOSIS — M159 Polyosteoarthritis, unspecified: Secondary | ICD-10-CM

## 2018-02-12 DIAGNOSIS — I359 Nonrheumatic aortic valve disorder, unspecified: Secondary | ICD-10-CM

## 2018-02-12 DIAGNOSIS — I272 Pulmonary hypertension, unspecified: Secondary | ICD-10-CM

## 2018-02-12 DIAGNOSIS — I4819 Other persistent atrial fibrillation: Secondary | ICD-10-CM

## 2018-02-12 DIAGNOSIS — N259 Disorder resulting from impaired renal tubular function, unspecified: Secondary | ICD-10-CM

## 2018-02-12 DIAGNOSIS — I5032 Chronic diastolic (congestive) heart failure: Secondary | ICD-10-CM

## 2018-02-12 NOTE — Patient Instructions (Signed)
Today we updated your med list in our EPIC system...    Continue your current medications the same...  Today we attempted an ambulatory Oximetry test to see if you might qualify for a portable oxygen concentrator...    You were able to walk ~1 lap in office & your Oxygen dropped to 90%  (it didn't drop low enough to qualify for the POC).  Continue w/ your Home oxygen at ~2 liters/min flow at night & as needed during the day- just as you have been doing at home...  We will order a new rolling walker that will allow you to stand up while ambulating   Call for any questions or if I can be of service in any way.Marland KitchenMarland Kitchen

## 2018-02-13 ENCOUNTER — Encounter: Payer: Self-pay | Admitting: Pulmonary Disease

## 2018-02-13 NOTE — Progress Notes (Signed)
Subjective:    Patient ID: [name] [name], male    DOB: [birth year], 84 y.o.   MRN: [id number]  HPI 84 y/o BM here for a follow up visit... he has multiple medical problems including HBP, CAD, AS, AFib, & CHF followed by DrHochrein & the CHFclinic;  known PulmHTN & PA aneurysm conservatively managed by DrGearhardt;  Hyperchol;  RI & BPH;  DJD & Gout... ~  SEE PREV EPIC NOTES FOR OLDER DATA >>     NOTE> pt did not go to the lab for his BMet & BNP as requested...  ADDENDUM: DrHochrein ordered CDopplers 12/13> mild heterogeneous plaque on right & mixed irreg plaque on left, 0-39% bilat ICA stenoses & antegrade vertebral flow; f/u 86yr..   CXR 6/14 showed borderline cardiomeg, extremely tort Ao, PA dilatation is the same (no change), elev left hemidiaph, scarring at bases, NAD...  LABS 6/14:  Pending...  CXR 6/15 showed similar prominent PA segment, mild cardiomeg & enlarged desc thor Ao, clear lungs w/ elev left hemidiaph (stable), DJD in Tspine...   LABS 6/15:  FLP- at goals on Lip20;  Chems- ok w/ BS=121 on diet, Cr=1.6 stable RI;  CBC- wnl;  TSH=2.80;  Uric=6.2 on allopurinol100;  BNP=511  CXR 11/16/15>  elev left hemidiaph, markedly tort Ao, right PA dilatation, sl incr markings, NAD...  Spirometry 11/16/15>  FVC=1.34 (37%), FEV1=0.95 (36%), %1sec=71%, mid-flows are reduced at 23% predicted;  This is c/w severe pulm restriction and superimposed small airways dis.  Ambulatory Oximetry 11/16/15>  O2sat=100% on RA at rest w/ pulse=75/min; He ambulated 1 Lap only & stopped due to fatigue, leg & back pain w/ lowest O2sat=92% w/ pulse 120/min  LABS 11/16/15>  Chems- ok x BUN=38, Cr=1.75;  CBC- ok w/ Hg=13.6;  TSH=2.58;  BNP=610;  Sed=41  2DEcho 12/06/15>  ModLVH, norm LVF w/ EF=60-65% & norm wall motion, AoV w/ thickened calcif leaflets & modAS, severe LAdil (440m & RAdil, mildly reduced RVF, severeTR, mod pulmHTN w/ PAsys=64...   ~  January 13, 2016:  76m61moV & [name] has been wearing his O2  regularly at night & more often during the day but still not 24/7 as recommended;  His PCP is GreTerri PiedraarPine [place] is 89 84o w/ AS, diastolicCHF, AFib, pulm art aneurysm & pulmHTN on a complex medical regimen-- Coumadin, ToprolXL100, VerapamilER120, Micardis80, Catapress0.2Bid, Demadex20-3/d, Eplerenone25, K20-3/d and I wonder if he's taking all this regularly;  He is here w/ his daughter today & her main issue is to request HOME HEALTH help as she says he qualifies for 35H/wk- OK...     We reviewed above studies-- CHF w/ BNP=610 but BUB=38 & Cr=1.75 therefore not much wiggle room w/ his diuretics => we will refer to CHF clinic for their input... EXAM shows Afeb, VSS, O2sat=92% on RA;  HEENT- neg, mallampati2;  Chest- clear w/o w/r/r;  Heart- irreg AFib, Gr2/6SEM w/o r/g;  Abd- soft, nontender, neg;  Ext- neg w/o c/c/e;  Neuro- no focal deficits... IMP/PLAN>>  We discussed referral to the CHF clinic, DrMWingateor his input regarding meds & optimization...  ~  April 17, 2016:  20mo31mo & CharSeanpatrickimproved- having refused the CHF clinic he ret to PCP office w/ VI, stasis changes, ?mosquito bite then soaked feet in chlorox!  Rx w/ Doxy, no salt, elevation, ACE wrap, etc... He notes that edema is resolved & he is using his oxygen more often now, plus home PT exercises + walking his dalmation/lab mix "[name]"...  He remains on O2 at 2L/min more often- supposed to be 24/7...    On Demadex20-2Qam&1Qpm + Eplerenone 23mQd, K30- 3/d;  Edema is down & wt= 187#...     BP controlled w/ this + MetopER100, VerapER120, Micardis80, Clonidine0.2Bid; BP= 110/78 & he denies CP, palpit, ch in SOB/ edema/ etc... EXAM shows Afeb, VSS, O2sat=96% on O2 at 2L;  HEENT- neg, mallampati2;  Chest- clear w/o w/r/r;  Heart- irreg AFib, Gr2/6SEM w/o r/g;  Abd- soft, nontender, neg;  Ext- neg w/o c/c/ tr edema;  Neuro- no focal deficits... IMP/PLAN>>  CSenicais improved, continue current meds the same + his home  oxygen at 2L/min;  Given 2017 FLU vaccine today 7 encouraged regular exercise program... we plan rov 673mo ~  March 22, 2017:  1164moV & [name] ([phone]) returns w/ his dauLarkfield-[phone] He called 3d ago c/o incr SOB, chest tightness, nonproductive cough x 3-4d, w/o CP/ congestion/ f/c/s;  We called in Doxy & Pred dosepak (he is on Coumadin for AFib & followed in the coumadin clinic);  He has several other minor somatic complaints that he mentions to me (his PCP is [name]) including a small cyst on his post neck & ?seborrheic rash which is really rather minor in degree on exam...  His main medical issue has been his Aortic Stenosis, followed for Cards by DrHochrein, last 2DEcho was 12/2015 showing modLVH, norm LVF w/ EF=60-65% & norm wall motion, AoV w/ thickened calcif leaflets & modAS, severe LAdil (14m76m RAdil, mildly reduced RVF, severeTR, mod pulmHTN w/ PAsys=64...   EPIC review over the last yr >>    Seen by Oprometrist- DrHensel 12/15/16> for f/u of his open angle Glaucoma & legally blind, prev laser surg DrGigengack, on 4 drops & note reviewed...     Seen by Podiatry- DrMayer 12/12/16> for onychomycosis follow up, note reviewed...    Seen last by PCP-[facility]/18> note reviewed, Medicare wellness exam...    Seen 11/07/16 by DrTaLucinda Dell [facility]> BPH w/ LUTS, some incontinence, nocturia; on Flomax0.4 & Finasteride 5mg/3mdded...    He last saw DrHochrein 08/21/16> followed for HBP, diastCHF, AS, AFib, and PulmHTN w/ PA aneurysm; denies any new symptoms, DrHochrein notes that [name] f/u Echo in the past & opted for conservative management; tolerates rate control meds and Coumadin anticoagulation; rec f/u 78yr..60yr reviewed the following medical problems during today's office visit >>     Ophthalmology- Glaucoma>  he is blind in left eye w/ poor vision on right as well (can't read or drive, can see some TV from a distance), hx Glaucoma Rx by DrBond on mult  drops...    Dyspnea> this is clearly multifactorial- 89 y/o40sedentary/deconditioned, mult cardiac issues- diastolicCHF, AS, AFib, PulmHTN; he has Home O2 using it Qhs but only prn days...    HBP> on MetoprololER100, CalanSR120, Micardis80, Clonidine0.2Bid, Demadex20-3/d, Epleronone25, K20-3/d; BP=128/72 & he denies CP, palpit, dizzy, edema but has SOB/DOE as above...    CHF> followed by DrHochrein for Cards- w/ severe DiastolicCHF, normal C's by cath in 2005, & normal LV systolic function w/ EF=75%EL=38% 1/17- stable, notes incr SOB/DOE & agrees w/ further eval at this point (f/u 2DEcho etc).    AS> w/ severely calcif AoV leaflets & mod AS w/ 28mm m19mgradient (peak=63) on 2DEcho 12/2015; now c/o incr SOB & f/u 2DEcho ordered...    AFib> on Coumadin via CC; stable AFib on Coumadin w/ rate control from [place] [facility]  he wants to continue as is...    Pulm Art aneurysm & PulmHTN> O2sat=90% on RA at rest today & he has home O2 but just uses it Qhs & prn days;  3cm PA aneurysm followed by DrGearhardt without change x yrs...    CHOL> on Lip20; FLP last 4/18 showed TChol 126, TG 89, HDL 46, LDL 62`... Stable, continue same rx...    Renal Insuffic, BPH> on Flomax0.4 & Finasteride '5mg'$ ; baseline Creat= 1.5-1.7 range; followed by DrTannenbaum & managed conservatively, no recent notes avail to review...    DJD, Gout> on Allopurinol100, VitD2000, Aleve/ Tylenol; stable w/o specific complaints at present... EXAM shows Afeb, VSS, O2sat=90% on RA;  HEENT- neg, mallampati2;  Chest- clear w/o w/r/r;  Heart- irreg AFib, Gr3/6SEM w/o r/g;  Abd- soft, nontender, neg;  Ext- neg w/o c/c/ tr edema;  Neuro- no focal deficits...  CXR 03/22/17 (independently reviewed by me in the PACS system) showed stable mild cardiomeg, aortic atherosclerosis & tortuosity, mild vasc congestion & sm effusions, elev left hemidiaph- NAD...   LABS 03/22/17>  Chems- ok w/ BS=117, K=4.2, Cr=1.49;  BNP=1195;  A1c=6.1;  CBC- ok w/ Hg=14.5 w/ mcv=104;   TSH=2.37;  (NOTE: we incr Demadex20-2Bid for now & he'll need B12 level on return)  2DEcho 03/27/17 showed modLVH, hyperdynamic LVF w/ EF=75-80%, no regional wall motion abn; AoV severely thickened/ calcified w/ modAS (mean grad28, peak grad 47); mod LA dil (40m), severe RA dil, RV mod dil & mod reduced sys function; PAsys=559mg... IMP/PLAN>>  Some of ChKolbeincr SOB is related to fluid overload (BNP=1195), hx severe diastolicCHF, he is on DeUVOZDGU44-0/H2am&1pm) + Inspra'25mg'$ /d & K20-3/d- so we decided to increase to 4 Demadex taking 2Bid;  He is asked to wear his O2 all day and night- 24/7;  He also wants to know if anything else can be done for his heart condition & dyspnea- [facility]...   ~  February 12, 2018:  1141moV & Keontae is attended by his PCP- DrJohn, [facility]- DrDeterding>  He has chr multifactorial dyspnea, HBP, chr diastolic CHF, aortic stenosis (deemed NOT a cand for TAVR), AFib, pulm art aneurysm and PulmHTN, along w/ severe glaucoma, renal insuffic, DJD/ Gout/ 89 23o w/ marked debilitation...                Problem List:  OPHTHALMOLOGY - s/p cataract surg & followed by DrBond for worsening Glaucoma & poor vision on gtts daily...  PULMONARY HYPERTENSION (ICD-416.8) - on home oxygen 2L/min "Prn"... ~  Prev 2DEcho w/ est PAsys= 64... he has not had a right heart cath... ~  Repeat 2DEcho 10/12 showed mod LVH, norm sys function w/ EF=55-60% & norm wall motion, Gr2DD, severely calcif AoV leaflets w/ mod AS & 47m6man gradient (peak=39), mild MR, severe LAdil & RAdil, RV sys function normal, modTR, PAsys=43... ~  6/13:  O2sat today is 95% on RA at rest;  CXR 6/13 showed mod cardiomeg unchanged, ectasia & calcif in Ao, enlarged right pulm art (no change), elev left hemidiaph w/ bibasilar atx, NAD... ~  6/14:  O2sat today is 94% on RA at rest;  CXR 6/14 showed borderline cardiomeg, extremely tort Ao, PA  dilatation is the same (no change), elev left hemidiaph, scarring at bases, NAD... ~  6/15:  O2sat today is 95% on RA at rest;  CXR 6/15 showed similar prominent PA segment, mild cardiomeg & enlarged desc thor  Ao, clear lungs w/ elev left hemidiaph (stable), DJD in Tspine.  HYPERTENSION (ICD-401.9) -  ~  on METOPROLOL ER 13m- 1/2 tab daily,  VERAPAMIL 2436md,  MICARDIS 8024m,  CLONIDINE 0.2mg32m/2 Bid,  DEMADEX 20mg48mm & 1pm,  EPLERENONE 25mg/14m K20- 3/d; he was prev on Minoxidil  but this discontinued by Cardiology... ~  6/10: DrHochrein added back the Clonidine & asked him, again, to monitor BP at home. ~  12/10:  they requested trial to decr K20's from 6/d required due to his Demedex... suggest adding ALDACTONE 25mg/d32mecr K20 to 4/d w/ f/u labs... ~  6/11:  Aldactone caused gynecomastia & was switched to EPLERENONE 25mg/d 90[name] w/ some improvment in side effect. ~  6/12:  BP controlled & labs sl improved w/ the Demadex decr to 3/d... ~  12/12:  BP= 126/72 today and not checking at home, tolerating meds well; denies HA, visual changes, CP, palipit, dizziness, syncope; he has chr DOE without change and mild edema... ~  6/13:  BP= 120/72 & he remains stable w/o CP, palpit, +SOB w/o change, tr edema... ~  12/13: on MetoprololER100-1/2 daily, CalanSR120, Micardis80, Clonidine0.2-1/2Bid, Demadex20-3/d, Epleronone25, K20-3/d; BP=144/92 & he has sl HA; we decided to incr his Metopto 100mg/d &61m Clonidine0.2 to 1 tab Bid. ~  6/14: on MetoprololER100, CalanSR120, Micardis80, Clonidine0.2Bid, Demadex20-3/d, Epleronone25, K20-3/d; BP=130/80 & he is asked to monitor BP at home more closely; he denies CP, palpit, dizzy, ch in SOB/DOE, edema. ~  12/14: on MetoprololER100, CalanSR120, Micardis80, Clonidine0.2Bid, Demadex20-3/d, Epleronone25, K20-3/d; BP=132/78 & he denies CP, palpit, dizzy, ch in SOB/DOE, edema. ~  6/15: on MetoprololER100, CalanSR120, Micardis80, Clonidine0.2Bid,  Demadex20-3/d, Epleronone25, K20-3/d; BP=132/84 & he remains asymptomatic but sedentary...  CHF (ICD-428.0) - see meds above... followed in the CHF Clinic by DrHochrein;  he has severe Diastolic CHF, w/ normal C's by cath in 2005, & normal LV systolic function w/ EF=75%...QZ=00% DOE- no change. ~  labs 6/09 showed BNP= 761 ~  labs 9/09 showed BNP= 486 ~  labs 12/09 showed BNP = 568 ~  labs 6/10 showed BNP= 421 ~  labs 12/10 showed BNP= 577... adding SPIRONOLACTONE 25mg/d. ~23mbs 1/11 showed BNP= 395... later ch to EPLERENONE 25mg/d. ~ 36ms 12/11 showed BNP= 535 ~  Labs 6/12 showed BNP= 524 (on Demedex3/d + Eplerenone25mg/d). ~ 17ms 12/12 showed BNP= 371 ~  4/13:  He had f/u DrHochrein> Mod AS, chr AFib, DD, etc; stable, no progressive symptoms & pt preferred conserv Rx; they discussed poss TAVR in future... ~  Labs 6/13 showed BNP= 381 (note: BUN=30 Creat=1.5) ~  1/14:  He had f/u DrHochrein> mult cardiovasc problems, doing well, no CP etc; rec salt & fluid restriction, pt declined f/u 2DEcho, on Coumadin & rate control, continue conservative management... ~  2/15:  He had Cards f/u w/ DrHochrein> stable, notes DOE- no change, pt refused f/u 2DEcho, rec conservative Rx. ~  Stable on diuretic therapy, no changes made...  AORTIC STENOSIS (ICD-424.1) - moderate by 2DEcho & followed by DrHochrein... ~  repeat 2DEcho 1/09 hosp = similiar mod AS... ~  Repeat 2DEcho 1/12 showed mild LVH, norm sys function w/ EF=55-60% & no regional wall motion abn, mod AS, dilated LA&RA, modTR, PAsys=50... ~  Repeat 2DEcho 10/12 showed mod LVH, norm sys function w/ EF=55-60% & norm wall motion, Gr2DD, severely calcif AoV leaflets w/ mod AS & 25mm mean gr68mnt (peak=39), mild MR, severe LAdil & RAdil, RV sys function  normal, modTR, PAsys=43... ~  Pt has refused f/u 2DEcho...  ATRIAL FIBRILLATION (ICD-427.31) - on COUMADIN & followed in the Coumadin Clinic. ~  EKG 4/13 showed AFib, rate82, LAD,  NSSTTWA...  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 ~  CXR 1/09 showed unchanged right PA aneurysm, stable cardiomeg, elevated left hemidiaph w/ atx. ~  CXR 6/10 showed no change, Ao tortuous and calcif, NAD. ~  CXR 12/11 showed stable elv left hemidiaph, bibasilar atx/ scarring, cardiomeg, ectatic ao, right PA enlargement. ~  CXR 6/13 showed mod cardiomeg unchanged, ectasia & calcif in Ao, enlarged right pulm art (no change), elev left hemidiaph w/ bibasilar atx, NAD.Marland Kitchen. ~  CXR 6/14 showed borderline cardiomeg, tortAo, PA dilatation is the same (no change), elev left hemidiaph, scarring at bases, NAD. ~  CXR 6/15 showed similar prominent PA segment, mild cardiomeg & enlarged desc thor Ao, clear lungs w/ elev left hemidiaph (stable), DJD in Tspine.  ASPVD >> on Coumadin for his AFib... ~  DrHochrein ordered CDopplers 12/13> mild heterogeneous plaque on right & mixed irreg plaque on left, 0-39% bilat ICA stenoses & antegrade vertebral flow; f/u 73yr..  HYPERCHOLESTEROLEMIA (ICD-272.0) - now on LIPITOR 214md (prev Simva40 changed due to concomitant Verap). ~  FLLumber[place]/09 on Simva40 showed TChol 95, TG 39, HDL 36, LDL 51 ~  FLP 12/09 on Simva40 showed TChol 167, TG 68, HDL 64, LDL 90 ~  FLP 1/11 on Simva40 showed TChol 153, TG 101, HDL 50, LDL 83... later ch to LiMcHenry~  FLWamic/12 on Lip20 showed TChol 162, TG 95, HDL 46, LDL 97 ~  6/13: not fasting for FLP today... ~  6/14: FLP on Lip20 showed TChol 150, TG 89, HDL 51, LDL 81... ~  FLP 6/15 on Lip20 showed TChol 147, TG 82, HDL 55, LDL 76...  HEMORRHOIDS (ICD-455.6)  RENAL INSUFFICIENCY (ICD-588.9)  ~  Creat=1.4 - 1.6 during 1/09 hosp... ~  labs 6/09 & 9/09 showed Creat= 1.3 ~  labs 12/09 showed BUN= 20, Creat= 1.3, K= 4.0 ~  labs 6/10 showed BUN= 24, Creat= 1.3, K= 3.8 ~  labs 12/10 showed BUN= 12, Creat= 1.2, K= 3.6 (on 6 K20/d) ~  labs 4/11 showed BUN= 18, Creat= 1.4, K= 4.0  (on 4 KCl/d) ~  labs 12/11 showed BUN= 32, Creat= 1.8, K= 4.3 ~  Labs 6/12 showed BUN= 27, creat= 1.6, K= 4.1 ~  Labs 12/12 showed BUN=21, Creat=1.6, K=3.9 ~  Labs 6/13 showed BUN= 30, Creat= 1.5, K= 4.5 ~  Labs 6/14 showed BUN= 26, Cr= 1.5, K= 3.5 (encouraged to take the KCl 2016m 3/d)... ~  Labs 6/15 showed BUN= 25, Cr= 1.6, K= 3.9  BENIGN PROSTATIC HYPERTROPHY, HX OF (ICD-V13.8) - followed by DrTannenbaum & taking FLOMAX 0.4mg47m.. last seen 5/09 and his note is reviewed... ~  labs 12/09 showed PSA= 1.42 ~  labs 2/11 showed PSA= 1.65 ~  Labs 6/13 showed PSA= 1.58 ~  10/13: he had Urology f/u DrTannenbaum> BPH w/ BOO, nocturia, mild Uincont; he didn't want any intervention & no changes made to his regimen...  CVA (ICD-434.91) - Stroke in 1990 in the setting of HBP...  DEGENERATIVE JOINT DISEASE, GENERALIZED (ICD-715.00) - he uses OTC pain meds & TRAMADOL 50mg48m...  GOUT (ICD-274.9) - on ALLOPURINOL 100mg/55m& now off the Colchicine... ~  labs 10/08 showed Uric= 10.3 ~  labs 12/09 showed Uric= 7.7... he Marland Kitchenants to continue same meds. ~  labs 12/11 showed Uric = 6.4 ~  Labs 6/15 showed Uric= 6.2  INGUINAL HERNIA (ICD-550.90)  Health Maintenance - he receives the yearly seasonal Flu vaccines... f/u PNEUMOVAX at his request 2010...   Past Surgical History:  Procedure Laterality Date  . APPENDECTOMY    . CATARACT EXTRACTION    . cosmetic eye surgery    . INGUINAL HERNIA REPAIR      Outpatient Encounter Medications as of 02/12/2018  Medication Sig  . allopurinol (ZYLOPRIM) 100 MG tablet TAKE 1 TABLET(100 MG) BY MOUTH DAILY.  Marland Kitchen atorvastatin (LIPITOR) 20 MG tablet Take 1 tablet (20 mg total) by mouth daily.  . bimatoprost (LUMIGAN) 0.01 % SOLN Place 1 drop into both eyes at bedtime.  . brinzolamide (AZOPT) 1 % ophthalmic suspension Place 1 drop into both eyes 2 (two) times daily.    . Calcifediol ER (RAYALDEE) 30 MCG CPCR Take 30 mcg by mouth daily.  Marland Kitchen eplerenone (INSPRA) 25 MG  tablet Take 1 tablet (25 mg total) by mouth daily.  . meclizine (ANTIVERT) 25 MG tablet Take 25 mg by mouth 3 (three) times daily as needed for dizziness.  . metoprolol succinate (TOPROL-XL) 100 MG 24 hr tablet TAKE 1 TABLET BY MOUTH DAILY WITH OR IMMEDIATELY FOLLOWING A MEAL  . pilocarpine (PILOCAR) 4 % ophthalmic solution Place 1 drop into the left eye 2 (two) times daily.   . potassium chloride SA (K-DUR,KLOR-CON) 20 MEQ tablet TAKE 3 TABLETS BY MOUTH DAILY (Patient taking differently: TAKE 2 TABLETS BY MOUTH DAILY)  . Tamsulosin HCl (FLOMAX) 0.4 MG CAPS Take 0.4 mg by mouth daily.    Marland Kitchen telmisartan (MICARDIS) 80 MG tablet Take 1 tablet (80 mg total) by mouth daily.  Marland Kitchen torsemide (DEMADEX) 20 MG tablet Take 2 tablets (40 mg total) by mouth 2 (two) times daily.  . verapamil (CALAN-SR) 120 MG CR tablet TAKE 1 TABLET(120 MG) BY MOUTH AT BEDTIME  . warfarin (COUMADIN) 5 MG tablet TAKE 1/2 TO 1 TABLET BY MOUTH DAILY AS DIRECTED BY COUMADIN CLINIC  . [DISCONTINUED] Cholecalciferol (VITAMIN D) 2000 UNITS CAPS Take 1 capsule by mouth daily.    . [DISCONTINUED] cloNIDine (CATAPRES) 0.2 MG tablet Take 1 tablet (0.2 mg total) by mouth 2 (two) times daily. (Patient taking differently: Take 0.2 mg by mouth 2 (two) times daily. Taking 1/2 tablet twice a day)  . [DISCONTINUED] NEOMYCIN-POLYMYXIN-HYDROCORTISONE (CORTISPORIN) 1 % SOLN OTIC solution Place 3 drops into the right ear 3 (three) times daily.   No facility-administered encounter medications on file as of 02/12/2018.     Allergies  Allergen Reactions  . Ibuprofen     REACTION: causes low blood pressure    Immunization History  Administered Date(s) Administered  . H1N1 07/08/2008  . Influenza Split 06/16/2011, 05/08/2012  . Influenza Whole 05/20/2008, 06/08/2009, 07/11/2010  . Influenza, High Dose Seasonal PF 06/15/2015, 05/15/2017  . Influenza,inj,Quad PF,6+ Mos 05/05/2013, 04/29/2014, 04/17/2016  . Pneumococcal Conjugate-13 05/19/2014  .  Pneumococcal Polysaccharide-23 07/19/2009  . Tdap 11/02/2011    Current Medications, Allergies, Past Medical History, Past Surgical History, Family History, and Social History were reviewed in [employer] record.    Review of Systems        See HPI - all other systems neg except as noted... The patient complains of decreased hearing, dyspnea on exertion, peripheral edema, and difficulty walking.  The patient denies anorexia, fever, weight loss, weight gain, vision loss, hoarseness, chest pain, syncope, prolonged cough, headaches, hemoptysis, abdominal pain, melena, hematochezia,  severe indigestion/heartburn, hematuria, incontinence, muscle weakness, suspicious skin lesions, transient blindness, depression, unusual weight change, abnormal bleeding, enlarged lymph nodes, and angioedema.   Objective:   Physical Exam     WD, WN, 84 y/o BM in NAD... GENERAL:  Alert & oriented; pleasant & cooperative... HEENT:  [place]/AT, EOM-wnl, PERRLA, EACs-clear, TMs-wnl, NOSE-clear, THROAT-clear & wnl. NECK:  Supple w/ fairROM; no JVD; sl decr carotid impulses w/ transmit murmur; no thyromegaly or nodules palpated; no lymphadenopathy. CHEST:  Clear to P & A; without wheezes/ rales/ or rhonchi heard... HEART:  sl irreg, gr 2-3/6 AS murmur at base, without rubs or gallops apprec... ABDOMEN:  Soft & nontender; normal bowel sounds; no organomegaly or masses detected. EXT: without deformities, mild arthritic changes; no varicose veins/ +venous insuffic/ tr edema. NEURO:  CN's intact; no focal neuro deficits... DERM:  No lesions noted; no rash etc...  RADIOLOGY DATA:  Reviewed in the EPIC EMR & discussed w/ the patient...  LABORATORY DATA:  Reviewed in the EPIC EMR & discussed w/ the patient...   Assessment & Plan:    HBP>  Adeq control on his extensive med regimen; we reviewed meds w/ family- they need to supervise & be sure he takes everything regularly...  CHF>  Stable on current  meds; renal w/ Creat=1.75 stable as well...  AS>  Mod AS on 2DEcho & followed by DrHochrein. 03/22/17:   Some of Mataio' incr SOB is related to fluid overload (BNP=1195), hx severe diastolicCHF, he is on HBZJIRC78-9/F (2am&1pm) + Inspra27m/d & K20-3/d- so we decided to increase to 4 Demadex taking 2Bid;  He is asked to wear his O2 all day and night- 24/7;  He also wants to know if anything else can be done for his heart condition & dyspnea- [facility] for his eval...  AFib>  He remains on Coumadin followed in the CC...  PA art aneurysm & Pulm HTN>  Stable on current med regimen & BP control...  CHOL>  FLP looks good on Lip20...  Renal Insuffic>  Creat improved to 1.5-1.8 & stable on current meds...  DJD/ Gout>  Stable as well, c/o some rib pain as noted; we discussed rest/ heat/ rib binder & Tramadol...  Other medical problems as noted...   Patient's Medications  New Prescriptions   No medications on file  Previous Medications   ALLOPURINOL (ZYLOPRIM) 100 MG TABLET    TAKE 1 TABLET(100 MG) BY MOUTH DAILY.   ATORVASTATIN (LIPITOR) 20 MG TABLET    Take 1 tablet (20 mg total) by mouth daily.   BIMATOPROST (LUMIGAN) 0.01 % SOLN    Place 1 drop into both eyes at bedtime.   BRINZOLAMIDE (AZOPT) 1 % OPHTHALMIC SUSPENSION    Place 1 drop into both eyes 2 (two) times daily.     CALCIFEDIOL ER (RAYALDEE) 30 MCG CPCR    Take 30 mcg by mouth daily.   EPLERENONE (INSPRA) 25 MG TABLET    Take 1 tablet (25 mg total) by mouth daily.   MECLIZINE (ANTIVERT) 25 MG TABLET    Take 25 mg by mouth 3 (three) times daily as needed for dizziness.   METOPROLOL SUCCINATE (TOPROL-XL) 100 MG 24 HR TABLET    TAKE 1 TABLET BY MOUTH DAILY WITH OR IMMEDIATELY FOLLOWING A MEAL   PILOCARPINE (PILOCAR) 4 % OPHTHALMIC SOLUTION    Place 1 drop into the left eye 2 (two) times daily.    POTASSIUM CHLORIDE SA (K-DUR,KLOR-CON) 20 MEQ TABLET  TAKE 3 TABLETS BY MOUTH DAILY   TAMSULOSIN HCL  (FLOMAX) 0.4 MG CAPS    Take 0.4 mg by mouth daily.     TELMISARTAN (MICARDIS) 80 MG TABLET    Take 1 tablet (80 mg total) by mouth daily.   TORSEMIDE (DEMADEX) 20 MG TABLET    Take 2 tablets (40 mg total) by mouth 2 (two) times daily.   VERAPAMIL (CALAN-SR) 120 MG CR TABLET    TAKE 1 TABLET(120 MG) BY MOUTH AT BEDTIME   WARFARIN (COUMADIN) 5 MG TABLET    TAKE 1/2 TO 1 TABLET BY MOUTH DAILY AS DIRECTED BY COUMADIN CLINIC  Modified Medications   No medications on file  Discontinued Medications   CHOLECALCIFEROL (VITAMIN D) 2000 UNITS CAPS    Take 1 capsule by mouth daily.     CLONIDINE (CATAPRES) 0.2 MG TABLET    Take 1 tablet (0.2 mg total) by mouth 2 (two) times daily.   NEOMYCIN-POLYMYXIN-HYDROCORTISONE (CORTISPORIN) 1 % SOLN OTIC SOLUTION    Place 3 drops into the right ear 3 (three) times daily.

## 2018-02-19 ENCOUNTER — Ambulatory Visit: Payer: Medicare Other | Admitting: *Deleted

## 2018-02-19 DIAGNOSIS — I481 Persistent atrial fibrillation: Secondary | ICD-10-CM

## 2018-02-19 DIAGNOSIS — Z7901 Long term (current) use of anticoagulants: Secondary | ICD-10-CM | POA: Diagnosis not present

## 2018-02-19 DIAGNOSIS — I4891 Unspecified atrial fibrillation: Secondary | ICD-10-CM

## 2018-02-19 DIAGNOSIS — I6350 Cerebral infarction due to unspecified occlusion or stenosis of unspecified cerebral artery: Secondary | ICD-10-CM

## 2018-02-19 DIAGNOSIS — I4819 Other persistent atrial fibrillation: Secondary | ICD-10-CM

## 2018-02-19 LAB — POCT INR: INR: 1.9 — AB (ref 2.0–3.0)

## 2018-02-19 NOTE — Patient Instructions (Signed)
Description   Today take 1 tablet then continue taking same dosage 1/2 tablet daily except 1 tablet on Mondays and Fridays. Recheck in 5 weeks. Call us with medication changes or concerns # [phone].

## 2018-02-26 ENCOUNTER — Other Ambulatory Visit: Payer: Self-pay

## 2018-02-26 MED ORDER — ALLOPURINOL 100 MG PO TABS: ORAL_TABLET | ORAL | 5 refills | Status: DC

## 2018-03-06 ENCOUNTER — Other Ambulatory Visit: Payer: Self-pay | Admitting: Pulmonary Disease

## 2018-03-06 ENCOUNTER — Telehealth: Payer: Self-pay | Admitting: Pulmonary Disease

## 2018-03-06 DIAGNOSIS — R54 Age-related physical debility: Secondary | ICD-10-CM

## 2018-03-06 DIAGNOSIS — I359 Nonrheumatic aortic valve disorder, unspecified: Secondary | ICD-10-CM

## 2018-03-06 NOTE — Progress Notes (Signed)
DME 

## 2018-03-06 NOTE — Telephone Encounter (Signed)
New DME order placed, for rolling walker, with seat.  Nothing further needed at this time.

## 2018-03-06 NOTE — Telephone Encounter (Signed)
Called and spoke with Patient's Daughter, [name].  [name] was checking to see how much longer before they should hear something about a new rolling walker. Patient Order for rolling walker was placed 02/12/18, and was signed off by SN.   Will route to [facility] to follow up

## 2018-03-06 NOTE — Telephone Encounter (Signed)
This order was not put in correctly & did not fall into our workque.  Will need to be put in again so we can send to dme.

## 2018-03-25 ENCOUNTER — Telehealth: Payer: Self-pay | Admitting: Pulmonary Disease

## 2018-03-25 NOTE — Telephone Encounter (Signed)
Called [facility] and spoke to [place] they have been waiting for insurance and should be calling him today or tomorrow I have called and let the patient know

## 2018-03-26 ENCOUNTER — Ambulatory Visit: Payer: Medicare Other

## 2018-03-26 DIAGNOSIS — Z7901 Long term (current) use of anticoagulants: Secondary | ICD-10-CM | POA: Diagnosis not present

## 2018-03-26 DIAGNOSIS — I4819 Other persistent atrial fibrillation: Secondary | ICD-10-CM

## 2018-03-26 DIAGNOSIS — I4891 Unspecified atrial fibrillation: Secondary | ICD-10-CM | POA: Diagnosis not present

## 2018-03-26 DIAGNOSIS — I6350 Cerebral infarction due to unspecified occlusion or stenosis of unspecified cerebral artery: Secondary | ICD-10-CM | POA: Diagnosis not present

## 2018-03-26 DIAGNOSIS — I481 Persistent atrial fibrillation: Secondary | ICD-10-CM | POA: Diagnosis not present

## 2018-03-26 LAB — POCT INR: INR: 1.6 — AB (ref 2.0–3.0)

## 2018-03-26 NOTE — Patient Instructions (Signed)
Please take a whole tablet today and tomorrow, then resume taking same dosage 1/2 tablet daily except 1 tablet on Mondays and Fridays. Recheck in 5 weeks. Call us with medication changes or concerns # [phone].

## 2018-03-28 ENCOUNTER — Other Ambulatory Visit: Payer: Self-pay | Admitting: Nurse Practitioner

## 2018-04-04 ENCOUNTER — Telehealth: Payer: Self-pay | Admitting: Pulmonary Disease

## 2018-04-04 MED ORDER — AZITHROMYCIN 250 MG PO TABS: ORAL_TABLET | ORAL | 0 refills | Status: DC

## 2018-04-04 MED ORDER — METHYLPREDNISOLONE 4 MG PO TBPK: ORAL_TABLET | ORAL | 0 refills | Status: DC

## 2018-04-04 NOTE — Telephone Encounter (Signed)
Spoke with pt. States that he is not feeling well. Reports increased chest congestion, cough and SOB. Pt does not know the color of his sputum. Denies chest tightness, wheezing or fever. Symptoms started 2-3 days ago. He would like to have something sent in.  SN - please advise. Thanks.  Allergies  Allergen Reactions  . Ibuprofen     REACTION: causes low blood pressure   Current Outpatient Medications on File Prior to Visit  Medication Sig Dispense Refill  . allopurinol (ZYLOPRIM) 100 MG tablet TAKE 1 TABLET(100 MG) BY MOUTH DAILY. 30 tablet 5  . atorvastatin (LIPITOR) 20 MG tablet Take 1 tablet (20 mg total) by mouth daily. 30 tablet 5  . bimatoprost (LUMIGAN) 0.01 % SOLN Place 1 drop into both eyes at bedtime.    . brinzolamide (AZOPT) 1 % ophthalmic suspension Place 1 drop into both eyes 2 (two) times daily.      . Calcifediol ER (RAYALDEE) 30 MCG CPCR Take 30 mcg by mouth daily.    Marland Kitchen eplerenone (INSPRA) 25 MG tablet Take 1 tablet (25 mg total) by mouth daily. 90 tablet 2  . meclizine (ANTIVERT) 25 MG tablet Take 25 mg by mouth 3 (three) times daily as needed for dizziness.    . metoprolol succinate (TOPROL-XL) 100 MG 24 hr tablet TAKE 1 TABLET BY MOUTH DAILY WITH OR IMMEDIATELY FOLLOWING A MEAL 90 tablet 3  . pilocarpine (PILOCAR) 4 % ophthalmic solution Place 1 drop into the left eye 2 (two) times daily.     . potassium chloride SA (K-DUR,KLOR-CON) 20 MEQ tablet TAKE 3 TABLETS BY MOUTH DAILY (Patient taking differently: TAKE 2 TABLETS BY MOUTH DAILY) 270 tablet 2  . Tamsulosin HCl (FLOMAX) 0.4 MG CAPS Take 0.4 mg by mouth daily.      Marland Kitchen telmisartan (MICARDIS) 80 MG tablet Take 1 tablet (80 mg total) by mouth daily. 90 tablet 3  . torsemide (DEMADEX) 20 MG tablet Take 2 tablets (40 mg total) by mouth 2 (two) times daily. 120 tablet 11  . verapamil (CALAN-SR) 120 MG CR tablet TAKE 1 TABLET(120 MG) BY MOUTH AT BEDTIME 90 tablet 1  . warfarin (COUMADIN) 5 MG tablet TAKE 1/2 TO 1 TABLET  BY MOUTH DAILY AS DIRECTED BY COUMADIN CLINIC 60 tablet 1   No current facility-administered medications on file prior to visit.

## 2018-04-04 NOTE — Telephone Encounter (Signed)
Per SN- Z pak and medrol dose pak. Called and spoke with Patient.  Patient stated understanding.  Prescriptions sent to preferred pharmacy, [facility] on [address] and [address].  Nothing further at this time.

## 2018-04-12 ENCOUNTER — Telehealth: Payer: Self-pay | Admitting: Pulmonary Disease

## 2018-04-12 NOTE — Telephone Encounter (Signed)
Called and spoke to pt.  Pt is calling for update on order for walker.  Order was placed to [facility] on 03/06/18. Pt states [facility] has not reached to him.  PCC's can you guys help with this.  *please see 03/25/18 phone note.

## 2018-04-12 NOTE — Telephone Encounter (Signed)
Spoke to [place] they called the patient to set up appt to bring walker and was waiting on the patient to call back gave them the number to call the patient

## 2018-04-12 NOTE — Telephone Encounter (Signed)
Left a message on answering machine at [facility] to call back

## 2018-04-27 ENCOUNTER — Other Ambulatory Visit: Payer: Self-pay | Admitting: Cardiology

## 2018-04-27 ENCOUNTER — Other Ambulatory Visit: Payer: Self-pay | Admitting: Cardiovascular Disease

## 2018-04-29 ENCOUNTER — Other Ambulatory Visit: Payer: Self-pay | Admitting: Cardiology

## 2018-04-29 NOTE — Telephone Encounter (Signed)
This is Dr. Hochrein's pt. °

## 2018-04-30 ENCOUNTER — Ambulatory Visit (INDEPENDENT_AMBULATORY_CARE_PROVIDER_SITE_OTHER): Payer: Medicare Other | Admitting: *Deleted

## 2018-04-30 ENCOUNTER — Ambulatory Visit: Payer: Medicare Other | Admitting: Podiatry

## 2018-04-30 ENCOUNTER — Encounter: Payer: Self-pay | Admitting: Podiatry

## 2018-04-30 DIAGNOSIS — I481 Persistent atrial fibrillation: Secondary | ICD-10-CM | POA: Diagnosis not present

## 2018-04-30 DIAGNOSIS — D689 Coagulation defect, unspecified: Secondary | ICD-10-CM

## 2018-04-30 DIAGNOSIS — I4891 Unspecified atrial fibrillation: Secondary | ICD-10-CM | POA: Diagnosis not present

## 2018-04-30 DIAGNOSIS — I6350 Cerebral infarction due to unspecified occlusion or stenosis of unspecified cerebral artery: Secondary | ICD-10-CM

## 2018-04-30 DIAGNOSIS — Z7901 Long term (current) use of anticoagulants: Secondary | ICD-10-CM | POA: Diagnosis not present

## 2018-04-30 DIAGNOSIS — I739 Peripheral vascular disease, unspecified: Secondary | ICD-10-CM

## 2018-04-30 DIAGNOSIS — I4819 Other persistent atrial fibrillation: Secondary | ICD-10-CM

## 2018-04-30 DIAGNOSIS — B351 Tinea unguium: Secondary | ICD-10-CM | POA: Diagnosis not present

## 2018-04-30 LAB — POCT INR: INR: 2 (ref 2.0–3.0)

## 2018-04-30 NOTE — Progress Notes (Signed)
Patient ID: OMARRI EICH, male   DOB: September 12, 1925, 84 y.o.   MRN: [id number]  Complaint:  Visit Type: Patient returns to my office for continued preventative foot care services. Complaint: Patient states" my nails have grown long and thick and become painful to walk and wear shoes"  The patient presents for preventative foot care services. No changes to ROS.  Patient is taking coumadin.  Podiatric Exam: Vascular: dorsalis pedis and posterior tibial pulses are palpable bilateral. Capillary return is immediate. Temperature gradient is WNL. Skin turgor WNL  Sensorium: Normal Semmes Weinstein monofilament test. Normal tactile sensation bilaterally. Nail Exam: Pt has thick disfigured discolored nails with subungual debris noted bilateral entire nail hallux through fifth toenails Ulcer Exam: There is no evidence of ulcer or pre-ulcerative changes or infection. Orthopedic Exam: Muscle tone and strength are WNL. No limitations in general ROM. No crepitus or effusions noted. Foot type and digits show no abnormalities. Bony prominences are unremarkable. Skin: No Porokeratosis. No infection or ulcers  Diagnosis:  Onychomycosis, , Pain in right toe, pain in left toes  Treatment & Plan Procedures and Treatment: Consent by patient was obtained for treatment procedures. The patient understood the discussion of treatment and procedures well. All questions were answered thoroughly reviewed. Debridement of mycotic and hypertrophic toenails, 1 through 5 bilateral and clearing of subungual debris. No ulceration, no infection noted.  Return Visit-Office Procedure: Patient instructed to return to the office for a follow up visit 3 months for continued evaluation and treatment.   Gardiner Barefoot DPM

## 2018-04-30 NOTE — Patient Instructions (Signed)
Description   Continue  taking same dosage 1/2 tablet daily except 1 tablet on Mondays and Fridays. Recheck in 4 weeks. Call us with medication changes or concerns # [phone].

## 2018-05-06 ENCOUNTER — Ambulatory Visit (INDEPENDENT_AMBULATORY_CARE_PROVIDER_SITE_OTHER): Payer: Medicare Other | Admitting: Internal Medicine

## 2018-05-06 ENCOUNTER — Encounter: Payer: Self-pay | Admitting: Internal Medicine

## 2018-05-06 VITALS — BP 114/72 | HR 78 | Temp 97.6°F | Ht 71.0 in | Wt 178.0 lb

## 2018-05-06 DIAGNOSIS — I10 Essential (primary) hypertension: Secondary | ICD-10-CM

## 2018-05-06 DIAGNOSIS — N183 Chronic kidney disease, stage 3 unspecified: Secondary | ICD-10-CM

## 2018-05-06 DIAGNOSIS — N401 Enlarged prostate with lower urinary tract symptoms: Secondary | ICD-10-CM

## 2018-05-06 DIAGNOSIS — R3914 Feeling of incomplete bladder emptying: Secondary | ICD-10-CM

## 2018-05-06 DIAGNOSIS — R269 Unspecified abnormalities of gait and mobility: Secondary | ICD-10-CM

## 2018-05-06 DIAGNOSIS — R739 Hyperglycemia, unspecified: Secondary | ICD-10-CM

## 2018-05-06 DIAGNOSIS — N400 Enlarged prostate without lower urinary tract symptoms: Secondary | ICD-10-CM | POA: Insufficient documentation

## 2018-05-06 DIAGNOSIS — I5032 Chronic diastolic (congestive) heart failure: Secondary | ICD-10-CM | POA: Diagnosis not present

## 2018-05-06 MED ORDER — TAMSULOSIN HCL 0.4 MG PO CAPS: 0.4000 mg | ORAL_CAPSULE | Freq: Two times a day (BID) | ORAL | 11 refills | Status: DC

## 2018-05-06 NOTE — Assessment & Plan Note (Addendum)
stable overall by history and exam, recent data reviewed with pt, and pt to continue medical treatment as before,  to f/u any worsening symptoms or concerns  Note:  Total time for pt hx, exam, review of record with pt in the room, determination of diagnoses and plan for further eval and tx is > 40 min, with over 50% spent in coordination and counseling of patient including the differential dx, tx, further evaluation and other management of CHF, gait disorder, BPH, CKD, HTN, hyperglycemia

## 2018-05-06 NOTE — Assessment & Plan Note (Signed)
stable overall by history and exam, recent data reviewed with pt, and pt to continue medical treatment as before,  to f/u any worsening symptoms or concerns  

## 2018-05-06 NOTE — Patient Instructions (Addendum)
You had the flu shot today  OK to increase the flomax to twice per day  Please continue all other medications as before, and refills have been done if requested.  Please have the pharmacy call with any other refills you may need.  Please continue your efforts at being more active, low cholesterol diet, and weight control.  You are otherwise up to date with prevention measures today.  Please keep your appointments with your specialists as you may have planned  You will be contacted regarding the referral for: Home Health with RN (nurse), Physical Therapy and Aide  Please return in 3 months, or sooner if needed

## 2018-05-06 NOTE — Assessment & Plan Note (Signed)
Ok for flomax bid, but may need urology as well, declines for now

## 2018-05-06 NOTE — Progress Notes (Signed)
Subjective:    Patient ID: [name] [name], male    DOB: 1925/10/30, 84 y.o.   MRN: [id number]  HPI  Here to f/u; overall doing ok,  Pt denies chest pain, increasing sob or doe, wheezing, orthopnea, PND, increased LE swelling, palpitations, dizziness or syncope.  Pt denies new neurological symptoms such as new headache, or facial or extremity weakness or numbness.  Pt denies polydipsia, polyuria, or low sugar episode.  Pt states overall good compliance with meds, mostly trying to follow appropriate diet, also with  Right calf swelling chronic with darked skin.  Has some worsening urinary slower stream and some retention despite the flomax daily, and worsening unstable gait with higher risk of fall.  Lives alone, does not have Re[place] in place Past Medical History:  Diagnosis Date  . 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 Aortic stenosis   . Atrial fibrillation ([place])   . BPH (benign prostatic hypertrophy)   . Congestive heart failure, unspecified   . CVA (cerebral infarction)   . DJD (degenerative joint disease)   . Generalized osteoarthrosis, unspecified site   . Gout, unspecified   . Pulmonary hypertension ([place])   . Pure hypercholesterolemia   . Renal insufficiency   . Unspecified cerebral artery occlusion with cerebral infarction   . Unspecified essential hypertension   . Unspecified hemorrhoids without mention of complication    Past Surgical History:  Procedure Laterality Date  . APPENDECTOMY    . CATARACT EXTRACTION    . cosmetic eye surgery    . INGUINAL HERNIA REPAIR      reports that he quit smoking about 14 years ago. His smoking use included cigarettes and cigars. He quit after 3.00 years of use. He has never used smokeless tobacco. He reports that he does not use drugs. His alcohol history is not on file. family history is not on file. Allergies  Allergen Reactions  . Ibuprofen     REACTION: causes low blood pressure   Current Outpatient Medications on File Prior to  Visit  Medication Sig Dispense Refill  . allopurinol (ZYLOPRIM) 100 MG tablet TAKE 1 TABLET(100 MG) BY MOUTH DAILY. 30 tablet 5  . atorvastatin (LIPITOR) 20 MG tablet Take 1 tablet (20 mg total) by mouth daily. 30 tablet 5  . bimatoprost (LUMIGAN) 0.01 % SOLN Place 1 drop into both eyes at bedtime.    . brinzolamide (AZOPT) 1 % ophthalmic suspension Place 1 drop into both eyes 2 (two) times daily.      Marland Kitchen eplerenone (INSPRA) 25 MG tablet Take 1 tablet (25 mg total) by mouth daily. 90 tablet 2  . meclizine (ANTIVERT) 25 MG tablet Take 25 mg by mouth 3 (three) times daily as needed for dizziness.    . metoprolol succinate (TOPROL-XL) 100 MG 24 hr tablet TAKE 1 TABLET BY MOUTH DAILY WITH OR IMMEDIATELY FOLLOWING A MEAL 90 tablet 3  . pilocarpine (PILOCAR) 4 % ophthalmic solution Place 1 drop into the left eye 2 (two) times daily.     . potassium chloride SA (K-DUR,KLOR-CON) 20 MEQ tablet TAKE 3 TABLETS BY MOUTH DAILY (Patient taking differently: TAKE 2 TABLETS BY MOUTH DAILY) 270 tablet 2  . telmisartan (MICARDIS) 80 MG tablet Take 1 tablet (80 mg total) by mouth daily. 90 tablet 3  . torsemide (DEMADEX) 20 MG tablet TAKE 2 TABLETS BY MOUTH TWICE DAILY EVERY 120 tablet 9  . verapamil (CALAN-SR) 120 MG CR tablet TAKE 1 TABLET(120 MG) BY MOUTH AT BEDTIME 90  tablet 3  . warfarin (COUMADIN) 5 MG tablet TAKE 1/2 TO 1 TABLET BY MOUTH DAILY AS DIRECTED BY COUMADIN CLINIC 60 tablet 1   No current facility-administered medications on file prior to visit.    Review of Systems  Constitutional: Negative for other unusual diaphoresis or sweats HENT: Negative for ear discharge or swelling Eyes: Negative for other worsening visual disturbances Respiratory: Negative for stridor or other swelling  Gastrointestinal: Negative for worsening distension or other blood Genitourinary: Negative for retention or other urinary change Musculoskeletal: Negative for other MSK pain or swelling Skin: Negative for color change  or other new lesions Neurological: Negative for worsening tremors and other numbness  Psychiatric/Behavioral: Negative for worsening agitation or other fatigue All other system neg per pt    Objective:   Physical Exam BP 114/72   Pulse 78   Temp 97.6 F (36.4 C) (Oral)   Ht 5\' 11"  (1.803 m)   Wt 178 lb (80.7 kg)   SpO2 91%   BMI 24.83 kg/m  - on chronic home o2 2.5 L VS noted,  Constitutional: Pt appears in NAD HENT: Head: NCAT.  Right Ear: External ear normal.  Left Ear: External ear normal.  Eyes: . Pupils are equal, round, and reactive to light. Conjunctivae and EOM are normal Nose: without d/c or deformity Neck: Neck supple. Gross normal ROM Cardiovascular: Normal rate and regular rhythm.   Pulmonary/Chest: Effort normal and breath sounds without rales or wheezing.  Abd:  Soft, NT, ND, + BS, no organomegaly Neurological: Pt is alert. At baseline orientation, motor grossly intact Skin: Skin is warm. No rashes, other new lesions, Psychiatric: Pt behavior is normal without agitation  Right calf swelling chronic appearing mild with large pretibial darkened skin area, no calf tender, neg homans Lab Results  Component Value Date   WBC 7.1 10/02/2017   HGB 14.2 10/02/2017   HCT 43.8 10/02/2017   PLT 203.0 10/02/2017   GLUCOSE 109 (H) 10/02/2017   CHOL 141 10/02/2017   TRIG 71.0 10/02/2017   HDL 55.00 10/02/2017   LDLCALC 71 10/02/2017   ALT 9 10/02/2017   AST 14 10/02/2017   NA 140 10/02/2017   K 4.3 10/02/2017   CL 102 10/02/2017   CREATININE 2.05 (H) 10/02/2017   BUN 41 (H) 10/02/2017   CO2 27 10/02/2017   TSH 3.96 10/02/2017   PSA 1.08 11/10/2016   INR 2.0 04/30/2018   HGBA1C 6.5 10/02/2017       Assessment & Plan:

## 2018-05-06 NOTE — Assessment & Plan Note (Signed)
Lab Results  Component Value Date   HGBA1C 6.5 10/02/2017  stable overall by history and exam, recent data reviewed with pt, and pt to continue medical treatment as before,  to f/u any worsening symptoms or concerns

## 2018-05-06 NOTE — Assessment & Plan Note (Signed)
Also for HH with RN, PT and aide 

## 2018-05-09 ENCOUNTER — Ambulatory Visit (INDEPENDENT_AMBULATORY_CARE_PROVIDER_SITE_OTHER)
Admission: RE | Admit: 2018-05-09 | Discharge: 2018-05-09 | Disposition: A | Discharge status: Home / Self Care | Payer: Medicare Other | Source: Ambulatory Visit | Attending: Nurse Practitioner | Admitting: Nurse Practitioner

## 2018-05-09 ENCOUNTER — Encounter: Payer: Self-pay | Admitting: Nurse Practitioner

## 2018-05-09 ENCOUNTER — Other Ambulatory Visit: Payer: Self-pay | Admitting: Nurse Practitioner

## 2018-05-09 ENCOUNTER — Ambulatory Visit: Payer: Medicare Other | Admitting: Nurse Practitioner

## 2018-05-09 ENCOUNTER — Telehealth: Payer: Self-pay | Admitting: Cardiology

## 2018-05-09 VITALS — BP 118/70 | HR 68 | Temp 98.0°F | Ht 71.0 in | Wt 178.0 lb

## 2018-05-09 DIAGNOSIS — R0602 Shortness of breath: Secondary | ICD-10-CM | POA: Diagnosis not present

## 2018-05-09 MED ORDER — OMEPRAZOLE 20 MG PO CPDR: 20.0000 mg | DELAYED_RELEASE_CAPSULE | Freq: Every day | ORAL | 0 refills | Status: DC

## 2018-05-09 MED ORDER — AMOXICILLIN-POT CLAVULANATE 875-125 MG PO TABS: 1.0000 | ORAL_TABLET | Freq: Two times a day (BID) | ORAL | 0 refills | Status: DC

## 2018-05-09 NOTE — Telephone Encounter (Signed)
Called patient's daughter, DPR about message. Patient saw pulmonology today that instructed patient to follow up with [facility]. [facility]IB Clinic ASAP for SOB and Heart being out of rhythm. Patient has A. FIB and is on coumadin and metoprolol. Patient HR at pulmonology was 68 . Patient has not been seen in A. FIB clinic, and next available is a week out. Made patient next available appointment with NP on Monday. Encouraged patient's daughter to take patient to ED if his SOB gets worse before his appointment. Patient's daughter verbalized understanding. Will forward to Dr. Percival Spanish for further advisement.

## 2018-05-09 NOTE — Patient Instructions (Addendum)
Heart rate is irregular which could be chronic from a-fib. Please follow up with a-fib clinic ASAP Will order Prilosec Will order chest x ray and call with results Follow up with Dr. Lenna Gilford in 1 week Please call if symptoms worsen

## 2018-05-09 NOTE — Telephone Encounter (Signed)
  Pt c/o Shortness Of Breath: STAT if SOB developed within the last 24 hours or pt is noticeably SOB on the phone  1. Are you currently SOB (can you hear that pt is SOB on the phone)? Patient is having episodes   2. How long have you been experiencing SOB? Week or so  3. Are you SOB when sitting or when up moving around? na  4. Are you currently experiencing any other symptoms? Dr. Lenna Gilford stated today that patient's heart is out of rhythym

## 2018-05-09 NOTE — Progress Notes (Signed)
@Patient  ID: [name] [name], male    DOB: [birth year], 84 y.o.   MRN: [id number]  Chief Complaint  Patient presents with  . Cough  . Shortness of Breath    Referring provider: Biagio Borg, MD  HPI  84 year old male with pulmonary hypertension followed by Dr. Lenna Gilford.  Health history includes pulmonary artery aneurysm, HTN, CAD, AS, AFIB, and CHF.   OV 05/09/18 - Acute - shortness of breath and cough Patient presents with shortness of breath and cough for the past 3 weeks. He states that symptoms have progressively worsened. He complains of worsening indigestion as well. States that cough is non productive of sputum. He denies any chest pain or fever. He is on 2L O2 continuously. He denies any edema. His weight has been stable over the past month. He has chronic a-fib.     Allergies  Allergen Reactions  . Ibuprofen     REACTION: causes low blood pressure    Immunization History  Administered Date(s) Administered  . H1N1 07/08/2008  . Influenza Split 06/16/2011, 05/08/2012  . Influenza Whole 05/20/2008, 06/08/2009, 07/11/2010  . Influenza, High Dose Seasonal PF 06/15/2015, 05/15/2017  . Influenza,inj,Quad PF,6+ Mos 05/05/2013, 04/29/2014, 04/17/2016  . Pneumococcal Conjugate-13 05/19/2014  . Pneumococcal Polysaccharide-23 07/19/2009  . Tdap 11/02/2011    Past Medical History:  Diagnosis Date  . 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 Aortic stenosis   . Atrial fibrillation ([place])   . BPH (benign prostatic hypertrophy)   . Congestive heart failure, unspecified   . CVA (cerebral infarction)   . DJD (degenerative joint disease)   . Generalized osteoarthrosis, unspecified site   . Gout, unspecified   . Pulmonary hypertension ([place])   . Pure hypercholesterolemia   . Renal insufficiency   . Unspecified cerebral artery occlusion with cerebral infarction   . Unspecified essential hypertension   . Unspecified hemorrhoids without mention of complication     Tobacco  History: Social History   Tobacco Use  Smoking Status Former Smoker  . Years: 3.00  . Types: Cigarettes, Cigars  . Last attempt to quit: 12/05/2003  . Years since quitting: 14.4  Smokeless Tobacco Never Used  Tobacco Comment   does not smoke cigars anymore   Counseling given: Not Answered Comment: does not smoke cigars anymore   Outpatient Encounter Medications as of 05/09/2018  Medication Sig  . allopurinol (ZYLOPRIM) 100 MG tablet TAKE 1 TABLET(100 MG) BY MOUTH DAILY.  Marland Kitchen atorvastatin (LIPITOR) 20 MG tablet Take 1 tablet (20 mg total) by mouth daily.  . bimatoprost (LUMIGAN) 0.01 % SOLN Place 1 drop into both eyes at bedtime.  . brinzolamide (AZOPT) 1 % ophthalmic suspension Place 1 drop into both eyes 2 (two) times daily.    . Calcifediol ER (RAYALDEE) 30 MCG CPCR Take by mouth at bedtime.  Marland Kitchen eplerenone (INSPRA) 25 MG tablet Take 1 tablet (25 mg total) by mouth daily.  . meclizine (ANTIVERT) 25 MG tablet Take 25 mg by mouth 3 (three) times daily as needed for dizziness.  . metoprolol succinate (TOPROL-XL) 100 MG 24 hr tablet TAKE 1 TABLET BY MOUTH DAILY WITH OR IMMEDIATELY FOLLOWING A MEAL  . Multiple Vitamin (MULTIVITAMIN) tablet Take 1 tablet by mouth daily.  . pilocarpine (PILOCAR) 4 % ophthalmic solution Place 1 drop into the left eye 2 (two) times daily.   . potassium chloride SA (K-DUR,KLOR-CON) 20 MEQ tablet TAKE 3 TABLETS BY MOUTH DAILY (Patient taking differently: TAKE 2 TABLETS BY MOUTH DAILY)  .  tamsulosin (FLOMAX) 0.4 MG CAPS capsule Take 1 capsule (0.4 mg total) by mouth 2 (two) times daily.  Marland Kitchen telmisartan (MICARDIS) 80 MG tablet Take 1 tablet (80 mg total) by mouth daily.  Marland Kitchen torsemide (DEMADEX) 20 MG tablet TAKE 2 TABLETS BY MOUTH TWICE DAILY EVERY  . verapamil (CALAN-SR) 120 MG CR tablet TAKE 1 TABLET(120 MG) BY MOUTH AT BEDTIME  . warfarin (COUMADIN) 5 MG tablet TAKE 1/2 TO 1 TABLET BY MOUTH DAILY AS DIRECTED BY COUMADIN CLINIC  . amoxicillin-clavulanate (AUGMENTIN)  875-125 MG tablet Take 1 tablet by mouth 2 (two) times daily.  Marland Kitchen omeprazole (PRILOSEC) 20 MG capsule Take 1 capsule (20 mg total) by mouth daily.   No facility-administered encounter medications on file as of 05/09/2018.      Review of Systems  Review of Systems  Constitutional: Negative.  Negative for appetite change, chills and fever.  HENT: Negative.  Negative for congestion.   Respiratory: Positive for cough and shortness of breath.   Cardiovascular: Negative.  Negative for chest pain, palpitations and leg swelling.  Gastrointestinal: Negative.   Allergic/Immunologic: Negative.   Neurological: Negative.   Psychiatric/Behavioral: Negative.        Physical Exam  BP 118/70 (BP Location: Right Arm, Patient Position: Sitting, Cuff Size: Normal)   Pulse 68   Temp 98 F (36.7 C)   Ht 5\' 11"  (1.803 m)   Wt 178 lb (80.7 kg)   SpO2 94%   BMI 24.83 kg/m   Wt Readings from Last 5 Encounters:  05/09/18 178 lb (80.7 kg)  05/06/18 178 lb (80.7 kg)  02/12/18 168 lb 12.8 oz (76.6 kg)  01/15/18 170 lb 9.6 oz (77.4 kg)  10/02/17 179 lb (81.2 kg)     Physical Exam  Constitutional: He is oriented to person, place, and time. He appears well-developed and well-nourished. No distress.  Cardiovascular: An irregular rhythm present.  Pulmonary/Chest: Effort normal. He has decreased breath sounds. He has no wheezes. He has rhonchi. He has no rales.  Neurological: He is alert and oriented to person, place, and time.  Skin: Skin is warm and dry.  Psychiatric: He has a normal mood and affect.  Nursing note and vitals reviewed.   Imaging: Dg Chest 2 View  Result Date: 05/09/2018 CLINICAL DATA:  Dyspnea, cough, and wheezing for the past 3 weeks. Symptoms of indigestion. History of CHF, pulmonary hypertension, coronary artery disease, valvular heart disease. EXAM: CHEST - 2 VIEW COMPARISON:  Chest x-ray of March 22, 2017 FINDINGS: There is chronic elevation of the left hemidiaphragm. There  is new patchy density at both lung bases. There is a small amount of pleural fluid on the right which is not new. The cardiac silhouette is enlarged. The right hilar structures remain prominent. There is tortuosity of the ascending and descending thoracic aorta with mural calcification. There is multilevel degenerative disc disease of the thoracic spine. IMPRESSION: Chronic bronchitic smoking related changes. New bibasilar atelectasis with chronic small bilateral pleural effusions. Low-grade CHF. Thoracic aortic atherosclerosis. Electronically Signed   By: David  Martinique M.D.   On: 05/09/2018 15:25     Assessment & Plan:   Dyspnea Patient Instructions  Heart rate is irregular which could be chronic from a-fib. Please follow up with a-fib clinic ASAP Will order Prilosec Will order chest x ray and call with results Follow up with Dr. Lenna Gilford in 1 week Please call if symptoms worsen         Fenton Foy, NP 05/09/2018

## 2018-05-09 NOTE — Assessment & Plan Note (Signed)
Patient Instructions  Heart rate is irregular which could be chronic from a-fib. Please follow up with a-fib clinic ASAP Will order Prilosec Will order chest x ray and call with results Follow up with Dr. Lenna Gilford in 1 week Please call if symptoms worsen

## 2018-05-10 NOTE — Telephone Encounter (Signed)
Agree 

## 2018-05-12 NOTE — Progress Notes (Signed)
Cardiology Office Note   Date:  05/13/2018   ID:  [name] [name], DOB Sep 20, 1925, MRN [id number]  PCP:  Biagio Borg, MD  Cardiologist: [facility] Chief Complaint  Patient presents with  . Atrial Fibrillation  . Aortic Stenosis     History of Present Illness: [name] [name] is a 84 y.o. male who presents for ongoing assessment and management of atrial fibrillation, chronic dyspnea, severe aortic valve stenosis but was not found to be a candidate for TAVR due to advanced age and multiple medical problems.  The patient is oxygen dependent and is very short of breath with minimal activity.    When last seen by cardiology on 01/15/2018 the patient was becoming increasingly weak and frail.  The patient was seen by pulmonology 4 days ago and was advised to see cardiology ASAP concerning his symptoms and possibly by A. fib clinic.  The patient has never been seen by atrial fibrillation clinic.  He was advised to continue his current medication regimen by pulmonology.  Chest X-ray revealed mild CHF and small bilateral pleural effusions He was placed on Augmentin by pulmonology but has not felt better. He denies coughing, but is now having sit up to breath sometimes while in bed, but not always. He states he is getting good results from torsemide. She states he has several urinals at home. She empties them when she visits.   He comes today feeling more weak and short of breath. His daughter who is with him states he lives alone,but she checks on him. He does not ambulate well and is in a wheelchair in the office. He denies chest pain,but appears very weak and frail. He remains on O2 via [place].   Past Medical History:  Diagnosis Date  . 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 Aortic stenosis   . Atrial fibrillation ([place])   . BPH (benign prostatic hypertrophy)   . Congestive heart failure, unspecified   . CVA (cerebral infarction)   . DJD (degenerative joint disease)   . Generalized  osteoarthrosis, unspecified site   . Gout, unspecified   . Pulmonary hypertension ([place])   . Pure hypercholesterolemia   . Renal insufficiency   . Unspecified cerebral artery occlusion with cerebral infarction   . Unspecified essential hypertension   . Unspecified hemorrhoids without mention of complication     Past Surgical History:  Procedure Laterality Date  . APPENDECTOMY    . CATARACT EXTRACTION    . cosmetic eye surgery    . INGUINAL HERNIA REPAIR       Current Outpatient Medications  Medication Sig Dispense Refill  . allopurinol (ZYLOPRIM) 100 MG tablet TAKE 1 TABLET(100 MG) BY MOUTH DAILY. 30 tablet 5  . amoxicillin-clavulanate (AUGMENTIN) 875-125 MG tablet Take 1 tablet by mouth 2 (two) times daily. 14 tablet 0  . atorvastatin (LIPITOR) 20 MG tablet Take 1 tablet (20 mg total) by mouth daily. 30 tablet 5  . bimatoprost (LUMIGAN) 0.01 % SOLN Place 1 drop into both eyes at bedtime.    . brinzolamide (AZOPT) 1 % ophthalmic suspension Place 1 drop into both eyes 2 (two) times daily.      . Calcifediol ER (RAYALDEE) 30 MCG CPCR Take by mouth at bedtime.    Marland Kitchen eplerenone (INSPRA) 25 MG tablet Take 1 tablet (25 mg total) by mouth daily. 90 tablet 2  . meclizine (ANTIVERT) 25 MG tablet Take 25 mg by mouth 3 (three) times daily as needed for dizziness.    Marland Kitchen  metoprolol succinate (TOPROL-XL) 100 MG 24 hr tablet TAKE 1 TABLET BY MOUTH DAILY WITH OR IMMEDIATELY FOLLOWING A MEAL 90 tablet 3  . Multiple Vitamin (MULTIVITAMIN) tablet Take 1 tablet by mouth daily.    Marland Kitchen omeprazole (PRILOSEC) 20 MG capsule TAKE 1 CAPSULE(20 MG) BY MOUTH DAILY 90 capsule 0  . pilocarpine (PILOCAR) 4 % ophthalmic solution Place 1 drop into the left eye 2 (two) times daily.     . potassium chloride SA (K-DUR,KLOR-CON) 20 MEQ tablet TAKE 3 TABLETS BY MOUTH DAILY (Patient taking differently: TAKE 2 TABLETS BY MOUTH DAILY) 270 tablet 2  . tamsulosin (FLOMAX) 0.4 MG CAPS capsule Take 1 capsule (0.4 mg total) by mouth  2 (two) times daily. 60 capsule 11  . telmisartan (MICARDIS) 80 MG tablet Take 1 tablet (80 mg total) by mouth daily. 90 tablet 3  . torsemide (DEMADEX) 20 MG tablet TAKE 2 TABLETS BY MOUTH TWICE DAILY EVERY 120 tablet 9  . verapamil (CALAN-SR) 120 MG CR tablet TAKE 1 TABLET(120 MG) BY MOUTH AT BEDTIME 90 tablet 3  . warfarin (COUMADIN) 5 MG tablet TAKE 1/2 TO 1 TABLET BY MOUTH DAILY AS DIRECTED BY COUMADIN CLINIC 60 tablet 1   No current facility-administered medications for this visit.     Allergies:   Ibuprofen    Social History:  The patient  reports that he quit smoking about 14 years ago. His smoking use included cigarettes and cigars. He quit after 3.00 years of use. He has never used smokeless tobacco. He reports that he does not use drugs.   Family History:  The patient's family history is not on file.    ROS: All other systems are reviewed and negative. Unless otherwise mentioned in H&P    PHYSICAL EXAM: VS:  BP 110/68   Pulse 90   Ht 5\' 11"  (1.803 m)   Wt 178 lb (80.7 kg)   BMI 24.83 kg/m  , BMI Body mass index is 24.83 kg/m. GEN: Well nourished, well developed, in no acute distress HEENT: normal Neck: no JVD, carotid bruits, or masses Cardiac: IRRR; 3/6 systolic  Murmur, rubs, or gallops,no edema  Respiratory: Diminished on the lower left with mild crackles noted GI: soft, nontender, nondistended, + BS MS: no deformity or atrophy.Extremely frail.  Skin: warm and dry, no rash Neuro:  Strength and sensation are significantly reduced. Psych: euthymic mood, full affect   EKG:  Atrial fib, rate of 90 bpm, ST& T wave abnormality , flattening in the lateral leads.   Recent Labs: 10/02/2017: ALT 9; BUN 41; Creatinine, Ser 2.05; Hemoglobin 14.2; Platelets 203.0; Potassium 4.3; Sodium 140; TSH 3.96    Lipid Panel    Component Value Date/Time   CHOL 141 10/02/2017 1639   TRIG 71.0 10/02/2017 1639   HDL 55.00 10/02/2017 1639   CHOLHDL 3 10/02/2017 1639   VLDL 14.2  10/02/2017 1639   LDLCALC 71 10/02/2017 1639      Wt Readings from Last 3 Encounters:  05/13/18 178 lb (80.7 kg)  05/09/18 178 lb (80.7 kg)  05/06/18 178 lb (80.7 kg)      Other studies Reviewed: Echocardiogram April 08, 2017 Procedure narrative: Transthoracic echocardiography. Image   quality was adequate. Intravenous contrast (Definity) was   administered. - Left ventricle: The cavity size was normal. Wall thickness was   increased in a pattern of moderate LVH. Systolic function was   hyperdynamic. The estimated ejection fraction was in the range of   75% to 80%. Wall motion was normal;  there were no regional wall   motion abnormalities. - Aortic valve: Moderately to severely calcified annulus. Severely   thickened, severely calcified leaflets. There was moderate   stenosis. Mean gradient (S): 28 mm Hg. Peak gradient (S): 47 mm   Hg. - Mitral valve: Mildly calcified annulus. - Left atrium: The atrium was moderately dilated. - Right ventricle: The cavity size was moderately dilated. Systolic   function was mildly to moderately reduced. - Right atrium: The atrium was severely dilated. - Pulmonary arteries: Systolic pressure was moderately increased.   PA peak pressure: 54 mm Hg (S).  ASSESSMENT AND PLAN:  1.  Severe AoV stenosis: He is not a candidate for TAVR due to multiple co-morbidities. Treat medically with BB, for HR control. He is not rate controlled currently. Will current weakness and frailty,BB titration while he is living alone is tenuous. Not sure how much of his medications he is able to take without his daughter with him all of the time. He is currently on metoprolol succinate 100 mg daily.   I have had a long conversation with his daughter who accompanies him today about considering SNF, as she cannot care for him. The patient is willing to have admission to SNF and the daughter is in agreement with this.   He is so frail and dyspneic at this time, I do not want him  to return home to be alone, daughter is unable to stay with him. I have suggested admission for further treatment of dyspnea and transition to SNF on discharge. He will go to ER and hopefully be admitted to Hospitalist service.   2. Mild CHF with possible pneumonia: He is being treated with antibiotics per pulmonology.   3.Atrial fibrillation:  He remains on coumadin therapy. With frailty and living alone I have safety concerns with continuation of this. Perhaps if he is in SNF it would be more feasible. This can be discussed why he is in the hospital.  4. Hypertension: He is on multiple antihypertensives to include verapamil, metoprolol, and losartan, along with diuretic. BP is low normal.  Medication adjustment during admission is recommended, possibly reduce verapamil dose.   Current medicines are reviewed at length with the patient today.    Labs/ tests ordered today include: None  Recommend admission via the ER to Hospitalist service. Consider Palliative care consult.   Phill Myron. West Pugh, ANP, AACC   05/13/2018 2:03 PM    [place] Office [phone] Fax [phone]

## 2018-05-13 ENCOUNTER — Inpatient Hospital Stay (HOSPITAL_COMMUNITY)
Admission: EM | Admit: 2018-05-13 | Discharge: 2018-05-16 | DRG: 291 | Disposition: A | Discharge status: Skilled Nursing Facility | Payer: Medicare Other | Attending: Family Medicine | Admitting: Family Medicine

## 2018-05-13 ENCOUNTER — Emergency Department (HOSPITAL_COMMUNITY): Payer: Medicare Other

## 2018-05-13 ENCOUNTER — Ambulatory Visit: Payer: Medicare Other | Admitting: Adult Health

## 2018-05-13 ENCOUNTER — Encounter: Payer: Self-pay | Admitting: Adult Health

## 2018-05-13 ENCOUNTER — Other Ambulatory Visit: Payer: Self-pay

## 2018-05-13 VITALS — BP 110/68 | HR 90 | Ht 71.0 in | Wt 178.0 lb

## 2018-05-13 DIAGNOSIS — R0600 Dyspnea, unspecified: Secondary | ICD-10-CM

## 2018-05-13 DIAGNOSIS — R532 Functional quadriplegia: Secondary | ICD-10-CM | POA: Diagnosis not present

## 2018-05-13 DIAGNOSIS — Z7901 Long term (current) use of anticoagulants: Secondary | ICD-10-CM

## 2018-05-13 DIAGNOSIS — I361 Nonrheumatic tricuspid (valve) insufficiency: Secondary | ICD-10-CM | POA: Diagnosis not present

## 2018-05-13 DIAGNOSIS — R3914 Feeling of incomplete bladder emptying: Secondary | ICD-10-CM | POA: Diagnosis not present

## 2018-05-13 DIAGNOSIS — Z9981 Dependence on supplemental oxygen: Secondary | ICD-10-CM | POA: Diagnosis not present

## 2018-05-13 DIAGNOSIS — I10 Essential (primary) hypertension: Secondary | ICD-10-CM | POA: Diagnosis not present

## 2018-05-13 DIAGNOSIS — I272 Pulmonary hypertension, unspecified: Secondary | ICD-10-CM | POA: Diagnosis present

## 2018-05-13 DIAGNOSIS — I509 Heart failure, unspecified: Secondary | ICD-10-CM

## 2018-05-13 DIAGNOSIS — N183 Chronic kidney disease, stage 3 unspecified: Secondary | ICD-10-CM | POA: Diagnosis present

## 2018-05-13 DIAGNOSIS — I350 Nonrheumatic aortic (valve) stenosis: Secondary | ICD-10-CM | POA: Diagnosis present

## 2018-05-13 DIAGNOSIS — I130 Hypertensive heart and chronic kidney disease with heart failure and stage 1 through stage 4 chronic kidney disease, or unspecified chronic kidney disease: Secondary | ICD-10-CM | POA: Diagnosis not present

## 2018-05-13 DIAGNOSIS — E7800 Pure hypercholesterolemia, unspecified: Secondary | ICD-10-CM | POA: Diagnosis present

## 2018-05-13 DIAGNOSIS — J189 Pneumonia, unspecified organism: Secondary | ICD-10-CM

## 2018-05-13 DIAGNOSIS — N401 Enlarged prostate with lower urinary tract symptoms: Secondary | ICD-10-CM

## 2018-05-13 DIAGNOSIS — Z515 Encounter for palliative care: Secondary | ICD-10-CM | POA: Diagnosis present

## 2018-05-13 DIAGNOSIS — I281 Aneurysm of pulmonary artery: Secondary | ICD-10-CM | POA: Diagnosis present

## 2018-05-13 DIAGNOSIS — I5033 Acute on chronic diastolic (congestive) heart failure: Secondary | ICD-10-CM | POA: Diagnosis present

## 2018-05-13 DIAGNOSIS — J9621 Acute and chronic respiratory failure with hypoxia: Secondary | ICD-10-CM | POA: Diagnosis present

## 2018-05-13 DIAGNOSIS — Z886 Allergy status to analgesic agent status: Secondary | ICD-10-CM

## 2018-05-13 DIAGNOSIS — I4821 Permanent atrial fibrillation: Secondary | ICD-10-CM | POA: Diagnosis present

## 2018-05-13 DIAGNOSIS — J9811 Atelectasis: Secondary | ICD-10-CM | POA: Diagnosis present

## 2018-05-13 DIAGNOSIS — N400 Enlarged prostate without lower urinary tract symptoms: Secondary | ICD-10-CM | POA: Diagnosis present

## 2018-05-13 DIAGNOSIS — N184 Chronic kidney disease, stage 4 (severe): Secondary | ICD-10-CM | POA: Diagnosis present

## 2018-05-13 DIAGNOSIS — I4819 Other persistent atrial fibrillation: Secondary | ICD-10-CM | POA: Diagnosis not present

## 2018-05-13 DIAGNOSIS — H409 Unspecified glaucoma: Secondary | ICD-10-CM | POA: Diagnosis present

## 2018-05-13 DIAGNOSIS — Z79899 Other long term (current) drug therapy: Secondary | ICD-10-CM

## 2018-05-13 DIAGNOSIS — Z87891 Personal history of nicotine dependence: Secondary | ICD-10-CM | POA: Diagnosis not present

## 2018-05-13 DIAGNOSIS — I4891 Unspecified atrial fibrillation: Secondary | ICD-10-CM

## 2018-05-13 DIAGNOSIS — I340 Nonrheumatic mitral (valve) insufficiency: Secondary | ICD-10-CM | POA: Diagnosis not present

## 2018-05-13 DIAGNOSIS — I4811 Longstanding persistent atrial fibrillation: Secondary | ICD-10-CM | POA: Diagnosis not present

## 2018-05-13 DIAGNOSIS — I2721 Secondary pulmonary arterial hypertension: Secondary | ICD-10-CM | POA: Diagnosis present

## 2018-05-13 DIAGNOSIS — H401190 Primary open-angle glaucoma, unspecified eye, stage unspecified: Secondary | ICD-10-CM | POA: Diagnosis present

## 2018-05-13 DIAGNOSIS — Z8673 Personal history of transient ischemic attack (TIA), and cerebral infarction without residual deficits: Secondary | ICD-10-CM

## 2018-05-13 DIAGNOSIS — M109 Gout, unspecified: Secondary | ICD-10-CM | POA: Diagnosis present

## 2018-05-13 DIAGNOSIS — I5020 Unspecified systolic (congestive) heart failure: Secondary | ICD-10-CM | POA: Diagnosis not present

## 2018-05-13 DIAGNOSIS — R0689 Other abnormalities of breathing: Secondary | ICD-10-CM

## 2018-05-13 LAB — BASIC METABOLIC PANEL
Anion gap: 12 (ref 5–15)
BUN: 27 mg/dL — AB (ref 8–23)
CHLORIDE: 105 mmol/L (ref 98–111)
CO2: 22 mmol/L (ref 22–32)
CREATININE: 1.8 mg/dL — AB (ref 0.61–1.24)
Calcium: 9.3 mg/dL (ref 8.9–10.3)
GFR calc Af Amer: 36 mL/min — ABNORMAL LOW (ref >60)
GFR calc non Af Amer: 31 mL/min — ABNORMAL LOW (ref >60)
Glucose, Bld: 127 mg/dL — ABNORMAL HIGH (ref 70–99)
POTASSIUM: 4 mmol/L (ref 3.5–5.1)
Sodium: 139 mmol/L (ref 135–145)

## 2018-05-13 LAB — CBC
HEMATOCRIT: 41.9 % (ref 39.0–52.0)
Hemoglobin: 12.9 g/dL — ABNORMAL LOW (ref 13.0–17.0)
MCH: 31.8 pg (ref 26.0–34.0)
MCHC: 30.8 g/dL (ref 30.0–36.0)
MCV: 103.2 fL — ABNORMAL HIGH (ref 78.0–100.0)
PLATELETS: 191 10*3/uL (ref 150–400)
RBC: 4.06 MIL/uL — ABNORMAL LOW (ref 4.22–5.81)
RDW: 16.8 % — AB (ref 11.5–15.5)
WBC: 8 10*3/uL (ref 4.0–10.5)

## 2018-05-13 LAB — PROTIME-INR
INR: 2.45
PROTHROMBIN TIME: 26.3 s — AB (ref 11.4–15.2)

## 2018-05-13 LAB — BRAIN NATRIURETIC PEPTIDE: B NATRIURETIC PEPTIDE 5: 974.3 pg/mL — AB (ref 0.0–100.0)

## 2018-05-13 LAB — TSH: TSH: 3.308 u[IU]/mL (ref 0.350–4.500)

## 2018-05-13 LAB — TROPONIN I: TROPONIN I: 0.03 ng/mL — AB (ref <0.03)

## 2018-05-13 LAB — I-STAT TROPONIN, ED: Troponin i, poc: 0.03 ng/mL (ref 0.00–0.08)

## 2018-05-13 MED ORDER — ONDANSETRON HCL 4 MG/2ML IJ SOLN: 4.0000 mg | Freq: Four times a day (QID) | INTRAMUSCULAR | Status: DC | PRN

## 2018-05-13 MED ORDER — ALLOPURINOL 100 MG PO TABS
100.0000 mg | ORAL_TABLET | Freq: Every day | ORAL | Status: DC
  Administered 2018-05-14 – 2018-05-16 (×3): 100 mg via ORAL
  Filled 2018-05-13 (×3): qty 1

## 2018-05-13 MED ORDER — WARFARIN - PHARMACIST DOSING INPATIENT: Freq: Every day | Status: DC

## 2018-05-13 MED ORDER — POTASSIUM CHLORIDE CRYS ER 20 MEQ PO TBCR
40.0000 meq | EXTENDED_RELEASE_TABLET | Freq: Every day | ORAL | Status: DC
  Administered 2018-05-14 – 2018-05-15 (×2): 40 meq via ORAL
  Filled 2018-05-13 (×2): qty 2

## 2018-05-13 MED ORDER — AMOXICILLIN-POT CLAVULANATE 500-125 MG PO TABS
1.0000 | ORAL_TABLET | Freq: Two times a day (BID) | ORAL | Status: AC
  Administered 2018-05-13 – 2018-05-16 (×7): 500 mg via ORAL
  Filled 2018-05-13 (×6): qty 1

## 2018-05-13 MED ORDER — BRINZOLAMIDE 1 % OP SUSP
1.0000 [drp] | Freq: Two times a day (BID) | OPHTHALMIC | Status: DC
  Administered 2018-05-13 – 2018-05-16 (×6): 1 [drp] via OPHTHALMIC
  Filled 2018-05-13: qty 10

## 2018-05-13 MED ORDER — TAMSULOSIN HCL 0.4 MG PO CAPS
0.4000 mg | ORAL_CAPSULE | Freq: Every day | ORAL | Status: DC
  Administered 2018-05-13 – 2018-05-15 (×3): 0.4 mg via ORAL
  Filled 2018-05-13 (×3): qty 1

## 2018-05-13 MED ORDER — PILOCARPINE HCL 4 % OP SOLN
1.0000 [drp] | Freq: Two times a day (BID) | OPHTHALMIC | Status: DC
  Administered 2018-05-13 – 2018-05-16 (×6): 1 [drp] via OPHTHALMIC
  Filled 2018-05-13: qty 15

## 2018-05-13 MED ORDER — GUAIFENESIN ER 600 MG PO TB12
600.0000 mg | ORAL_TABLET | Freq: Two times a day (BID) | ORAL | Status: DC
  Administered 2018-05-13 – 2018-05-16 (×6): 600 mg via ORAL
  Filled 2018-05-13 (×6): qty 1

## 2018-05-13 MED ORDER — IRBESARTAN 300 MG PO TABS
300.0000 mg | ORAL_TABLET | Freq: Every day | ORAL | Status: DC
  Administered 2018-05-14 – 2018-05-16 (×3): 300 mg via ORAL
  Filled 2018-05-13 (×3): qty 1

## 2018-05-13 MED ORDER — VERAPAMIL HCL ER 120 MG PO TBCR
120.0000 mg | EXTENDED_RELEASE_TABLET | Freq: Every day | ORAL | Status: DC
  Administered 2018-05-13 – 2018-05-15 (×3): 120 mg via ORAL
  Filled 2018-05-13 (×5): qty 1

## 2018-05-13 MED ORDER — FUROSEMIDE 10 MG/ML IJ SOLN
40.0000 mg | Freq: Once | INTRAMUSCULAR | Status: AC
  Administered 2018-05-13: 40 mg via INTRAVENOUS
  Filled 2018-05-13: qty 4

## 2018-05-13 MED ORDER — SODIUM CHLORIDE 0.9% FLUSH: 3.0000 mL | INTRAVENOUS | Status: DC | PRN

## 2018-05-13 MED ORDER — IPRATROPIUM-ALBUTEROL 0.5-2.5 (3) MG/3ML IN SOLN
RESPIRATORY_TRACT | Status: AC
  Filled 2018-05-13: qty 3

## 2018-05-13 MED ORDER — IPRATROPIUM-ALBUTEROL 0.5-2.5 (3) MG/3ML IN SOLN
3.0000 mL | RESPIRATORY_TRACT | Status: AC
  Administered 2018-05-13: 3 mL via RESPIRATORY_TRACT

## 2018-05-13 MED ORDER — WARFARIN SODIUM 5 MG PO TABS
5.0000 mg | ORAL_TABLET | Freq: Once | ORAL | Status: AC
  Administered 2018-05-13: 5 mg via ORAL
  Filled 2018-05-13: qty 1

## 2018-05-13 MED ORDER — SPIRONOLACTONE 25 MG PO TABS
25.0000 mg | ORAL_TABLET | Freq: Every day | ORAL | Status: DC
  Administered 2018-05-14 – 2018-05-16 (×3): 25 mg via ORAL
  Filled 2018-05-13 (×3): qty 1

## 2018-05-13 MED ORDER — FUROSEMIDE 10 MG/ML IJ SOLN
20.0000 mg | Freq: Once | INTRAMUSCULAR | Status: AC
  Administered 2018-05-13: 20 mg via INTRAVENOUS
  Filled 2018-05-13: qty 2

## 2018-05-13 MED ORDER — ATORVASTATIN CALCIUM 20 MG PO TABS
20.0000 mg | ORAL_TABLET | Freq: Every day | ORAL | Status: DC
  Administered 2018-05-13 – 2018-05-15 (×3): 20 mg via ORAL
  Filled 2018-05-13 (×3): qty 1

## 2018-05-13 MED ORDER — AMOXICILLIN-POT CLAVULANATE 875-125 MG PO TABS: 1.0000 | ORAL_TABLET | Freq: Two times a day (BID) | ORAL | Status: DC

## 2018-05-13 MED ORDER — METOPROLOL SUCCINATE ER 100 MG PO TB24
100.0000 mg | ORAL_TABLET | Freq: Every day | ORAL | Status: DC
  Administered 2018-05-14 – 2018-05-16 (×3): 100 mg via ORAL
  Filled 2018-05-13 (×3): qty 1

## 2018-05-13 MED ORDER — ALBUTEROL SULFATE (2.5 MG/3ML) 0.083% IN NEBU
2.5000 mg | INHALATION_SOLUTION | Freq: Four times a day (QID) | RESPIRATORY_TRACT | Status: DC | PRN
  Administered 2018-05-16: 2.5 mg via RESPIRATORY_TRACT
  Filled 2018-05-13: qty 3

## 2018-05-13 MED ORDER — PANTOPRAZOLE SODIUM 40 MG PO TBEC
40.0000 mg | DELAYED_RELEASE_TABLET | Freq: Every day | ORAL | Status: DC
  Administered 2018-05-13 – 2018-05-15 (×3): 40 mg via ORAL
  Filled 2018-05-13 (×3): qty 1

## 2018-05-13 MED ORDER — SODIUM CHLORIDE 0.9 % IV SOLN: 250.0000 mL | INTRAVENOUS | Status: DC | PRN

## 2018-05-13 MED ORDER — CALCIFEDIOL ER 30 MCG PO CPCR: 30.0000 ug | ORAL_CAPSULE | Freq: Every day | ORAL | Status: DC

## 2018-05-13 MED ORDER — HEPARIN SODIUM (PORCINE) 5000 UNIT/ML IJ SOLN: 5000.0000 [IU] | Freq: Three times a day (TID) | INTRAMUSCULAR | Status: DC

## 2018-05-13 MED ORDER — LATANOPROST 0.005 % OP SOLN
1.0000 [drp] | Freq: Every day | OPHTHALMIC | Status: DC
  Administered 2018-05-13 – 2018-05-15 (×3): 1 [drp] via OPHTHALMIC
  Filled 2018-05-13: qty 2.5

## 2018-05-13 MED ORDER — IPRATROPIUM-ALBUTEROL 0.5-2.5 (3) MG/3ML IN SOLN: 3.0000 mL | Freq: Once | RESPIRATORY_TRACT | Status: DC

## 2018-05-13 MED ORDER — ACETAMINOPHEN 325 MG PO TABS
650.0000 mg | ORAL_TABLET | ORAL | Status: DC | PRN
  Administered 2018-05-14 – 2018-05-16 (×2): 650 mg via ORAL
  Filled 2018-05-13 (×2): qty 2

## 2018-05-13 MED ORDER — POTASSIUM CHLORIDE CRYS ER 20 MEQ PO TBCR: 20.0000 meq | EXTENDED_RELEASE_TABLET | ORAL | Status: DC

## 2018-05-13 MED ORDER — POTASSIUM CHLORIDE CRYS ER 20 MEQ PO TBCR
20.0000 meq | EXTENDED_RELEASE_TABLET | Freq: Every day | ORAL | Status: DC
  Administered 2018-05-13 – 2018-05-15 (×3): 20 meq via ORAL
  Filled 2018-05-13 (×3): qty 1

## 2018-05-13 MED ORDER — SODIUM CHLORIDE 0.9% FLUSH
3.0000 mL | Freq: Two times a day (BID) | INTRAVENOUS | Status: DC
  Administered 2018-05-13 – 2018-05-16 (×5): 3 mL via INTRAVENOUS

## 2018-05-13 NOTE — Patient Instructions (Signed)
Medication Instructions:  NO CHANGES- Your physician recommends that you continue on your current medications as directed. Please refer to the Current Medication list given to you today.  If you need a refill on your cardiac medications before your next appointment, please call your pharmacy.  Follow-Up: At [facility], you and your health needs are our priority.  As part of our continuing mission to provide you with exceptional heart care, we have created designated Provider Care Teams.  These Care Teams include your primary Cardiologist (physician) and Advanced Practice Providers (APPs -  Physician Assistants and Nurse Practitioners) who all work together to provide you with the care you need, when you need it. . You will need a follow up appointment  AFTER HOSPITALIZATION ~ 1 WEEK.  You may see  DR [facility] or one of the following Advanced Practice Providers on your designated Care Team:   . Rosaria Ferries, PA-C . Jory Sims, DNP, ANP   Thank you for choosing [facility] at [facility]!!

## 2018-05-13 NOTE — ED Triage Notes (Signed)
Pt reports being seen at pulmonologist on Thursday was given antibiotics for "infection in lungs." Pt was seen at cardiologist today and told to come here sue to shortness of breath on exertion. Pt wears O2 2.5L [place] all the time. Pt denies chest pain, but reports a fluttering to the left side of his chest.

## 2018-05-13 NOTE — ED Provider Notes (Addendum)
[facility] EMERGENCY DEPARTMENT Provider Note   CSN: [id number] Arrival date & time: 05/13/18  1502     History   Chief Complaint Chief Complaint  Patient presents with  . Shortness of Breath    HPI [name] [name] is a 84 y.o. male.  HPI  84 year old male with PMH of referred A. fib on metoprolol 100mg  qd, pulmonary artery aneurysm, pulmonary HTN, severe aortic stenosis, HFpEF last known EF 75-80% on torsemide, history of CVA, CKD stage III who presents with chief complaint of shortness of breath.  Patient states for the last 3 she is a progressively worsening shortness of breath.  Patient endorses orthopnea and PND.  Endorses progressive dyspnea on exertion to the point of difficulty with ambulation.  Patient with one fall in the last 3 weeks due to progressive weakness.  Patient was seen by pulmonologist roughly 1 week ago who was concerned the patient could be having comp occasions of his A. fib and referred him to A. fib clinic.  Pulmonologist also started patient on Augmentin.  Patient seen in A. fib clinic today who referred patient to ER for concern of dyspnea on exertion, feeling patient is unsafe to be discharged home.  Past Medical History:  Diagnosis Date  . 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 Aortic stenosis   . Atrial fibrillation ([place])   . BPH (benign prostatic hypertrophy)   . Congestive heart failure, unspecified   . CVA (cerebral infarction)   . DJD (degenerative joint disease)   . Generalized osteoarthrosis, unspecified site   . Gout, unspecified   . Pulmonary hypertension ([place])   . Pure hypercholesterolemia   . Renal insufficiency   . Unspecified cerebral artery occlusion with cerebral infarction   . Unspecified essential hypertension   . Unspecified hemorrhoids without mention of complication     Patient Active Problem List   Diagnosis Date Noted  . Gait disorder 05/06/2018  . BPH (benign prostatic hyperplasia) 05/06/2018  .  Hyperglycemia 10/02/2017  . CKD (chronic kidney disease) stage 3, GFR 30-59 ml/min (HCC) 10/02/2017  . Medicare annual wellness visit, subsequent 11/10/2016  . Routine adult health maintenance 11/10/2016  . Dyspnea 11/16/2015  . Lipoma of neck 06/15/2015  . Back pain 06/15/2015  . Lightheadedness 03/04/2015  . Glaucoma 01/14/2013  . Long term current use of anticoagulant 09/26/2010  . CHRONIC CORONARY ISCHEMIA 02/01/2010  . GYNECOMASTIA 11/15/2009  . DIZZINESS 07/13/2009  . Pulmonary hypertension ([place]) 09/25/2007  . GOUT 07/25/2007  . 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 Aortic valve disorder 07/25/2007  . ATRIAL FIBRILLATION 07/25/2007  . HEMORRHOIDS 07/25/2007  . BENIGN PROSTATIC HYPERTROPHY, HX OF 07/25/2007  . INGUINAL HERNIA 07/24/2007  . HYPERCHOLESTEROLEMIA 06/01/2007  . Essential hypertension 06/01/2007  . Congestive heart failure ([place]) 06/01/2007  . CVA 06/01/2007  . Disorder resulting from impaired renal function 06/01/2007  . Osteoarthritis 06/01/2007    Past Surgical History:  Procedure Laterality Date  . APPENDECTOMY    . CATARACT EXTRACTION    . cosmetic eye surgery    . INGUINAL HERNIA REPAIR          Home Medications    Prior to Admission medications   Medication Sig Start Date End Date Taking? Authorizing Provider  allopurinol (ZYLOPRIM) 100 MG tablet TAKE 1 TABLET(100 MG) BY MOUTH DAILY. Patient taking differently: Take 100 mg by mouth daily.  02/26/18  Yes Biagio Borg, MD  amoxicillin-clavulanate (AUGMENTIN) 875-125 MG tablet Take 1 tablet by mouth  2 (two) times daily. 05/09/18  Yes Fenton Foy, NP  atorvastatin (LIPITOR) 20 MG tablet Take 1 tablet (20 mg total) by mouth daily. Patient taking differently: Take 20 mg by mouth at bedtime.  10/09/17  Yes Hochrein, Jeneen Rinks, MD  bimatoprost (LUMIGAN) 0.01 % SOLN Place 1 drop into both eyes at bedtime.   Yes [provider]  brinzolamide (AZOPT) 1 % ophthalmic suspension Place 1  drop into both eyes 2 (two) times daily.     Yes [provider]  Calcifediol ER (RAYALDEE) 30 MCG CPCR Take 30 mcg by mouth at bedtime.    Yes [provider]  eplerenone (INSPRA) 25 MG tablet Take 1 tablet (25 mg total) by mouth daily. 10/29/17  Yes Minus Breeding, MD  metoprolol succinate (TOPROL-XL) 100 MG 24 hr tablet TAKE 1 TABLET BY MOUTH DAILY WITH OR IMMEDIATELY FOLLOWING A MEAL Patient taking differently: Take 100 mg by mouth daily at 12 noon.  09/04/17  Yes Minus Breeding, MD  Multiple Vitamin (MULTIVITAMIN) tablet Take 1 tablet by mouth daily.   Yes [provider]  omeprazole (PRILOSEC) 20 MG capsule TAKE 1 CAPSULE(20 MG) BY MOUTH DAILY Patient taking differently: Take 20 mg by mouth daily as needed (reflux).  05/10/18  Yes Fenton Foy, NP  pilocarpine (PILOCAR) 4 % ophthalmic solution Place 1 drop into the left eye 2 (two) times daily.    Yes [provider]  potassium chloride SA (K-DUR,KLOR-CON) 20 MEQ tablet TAKE 3 TABLETS BY MOUTH DAILY Patient taking differently: Take 20-40 mEq by mouth See admin instructions. Take 2 tablets at noon, and 1 tablet at bedtime 10/01/17  Yes Minus Breeding, MD  tamsulosin (FLOMAX) 0.4 MG CAPS capsule Take 1 capsule (0.4 mg total) by mouth 2 (two) times daily. Patient taking differently: Take 0.4 mg by mouth at bedtime.  05/06/18  Yes Biagio Borg, MD  telmisartan (MICARDIS) 80 MG tablet Take 1 tablet (80 mg total) by mouth daily. Patient taking differently: Take 80 mg by mouth daily at 12 noon.  10/01/17  Yes Minus Breeding, MD  torsemide (DEMADEX) 20 MG tablet TAKE 2 TABLETS BY MOUTH TWICE DAILY EVERY Patient taking differently: Take 40 mg by mouth 2 (two) times daily.  04/29/18  Yes Sherren Mocha, MD  verapamil (CALAN-SR) 120 MG CR tablet TAKE 1 TABLET(120 MG) BY MOUTH AT BEDTIME Patient taking differently: Take 120 mg by mouth at bedtime.  04/29/18  Yes Minus Breeding, MD  warfarin (COUMADIN) 5 MG tablet  TAKE 1/2 TO 1 TABLET BY MOUTH DAILY AS DIRECTED BY COUMADIN CLINIC Patient taking differently: Take 2.5-5 mg by mouth See admin instructions. Take 1 tablet (5mg ) on Mon and Fri. Take 1/2 tablet (2.5mg ) all other days at noon 04/30/18  Yes Minus Breeding, MD    Family History No family history on file.  Social History Social History   Tobacco Use  . Smoking status: Former Smoker    Years: 3.00    Types: Cigarettes, Cigars    Last attempt to quit: 12/05/2003    Years since quitting: 14.4  . Smokeless tobacco: Never Used  . Tobacco comment: does not smoke cigars anymore  Substance Use Topics  . Alcohol use: Not on file    Comment: Very rarely  . Drug use: No     Allergies   Ibuprofen   Review of Systems Review of Systems  Constitutional: Negative for fever.  HENT: Negative for sore throat.   Eyes: Negative for redness.  Respiratory:  Positive for shortness of breath.   Cardiovascular: Negative for chest pain.  Gastrointestinal: Negative for abdominal pain.  Genitourinary: Negative for flank pain.  Musculoskeletal: Negative for back pain and neck pain.  Skin: Negative for rash.  Neurological: Negative for headaches.  Hematological: Does not bruise/bleed easily.  Psychiatric/Behavioral: Negative for confusion.     Physical Exam Updated Vital Signs BP (!) 149/101   Pulse (!) 108   Temp 97.8 F (36.6 C)   Resp (!) 22   SpO2 93%   Physical Exam  Constitutional: He appears well-developed and well-nourished.  HENT:  Mouth/Throat: Oropharynx is clear and moist.  Eyes: Conjunctivae are normal.  Neck: Neck supple. No tracheal deviation present.  Cardiovascular: Regular rhythm, normal heart sounds and intact distal pulses.  Pulmonary/Chest: Effort normal. No accessory muscle usage. No respiratory distress.  Rales bases.   Abdominal: Soft. Bowel sounds are normal. He exhibits no distension. There is no tenderness.  Musculoskeletal: He exhibits no tenderness.    Neurological: He is alert.  Skin: Skin is warm and dry.  Psychiatric: He has a normal mood and affect.  Nursing note and vitals reviewed.    ED Treatments / Results  Labs (all labs ordered are listed, but only abnormal results are displayed) Labs Reviewed  BASIC METABOLIC PANEL - Abnormal; Notable for the following components:      Result Value   Glucose, Bld 127 (*)    BUN 27 (*)    Creatinine, Ser 1.80 (*)    GFR calc non Af Amer 31 (*)    GFR calc Af Amer 36 (*)    All other components within normal limits  CBC - Abnormal; Notable for the following components:   RBC 4.06 (*)    Hemoglobin 12.9 (*)    MCV 103.2 (*)    RDW 16.8 (*)    All other components within normal limits  BRAIN NATRIURETIC PEPTIDE - Abnormal; Notable for the following components:   B Natriuretic Peptide 974.3 (*)    All other components within normal limits  PROTIME-INR - Abnormal; Notable for the following components:   Prothrombin Time 26.3 (*)    All other components within normal limits  I-STAT TROPONIN, ED    EKG EKG Interpretation  Date/Time:  Monday May 13 2018 15:13:19 EDT Ventricular Rate:  87 PR Interval:    QRS Duration: 106 QT Interval:  376 QTC Calculation: 452 R Axis:   -66 Text Interpretation:  Atrial fibrillation with premature ventricular or aberrantly conducted complexes Incomplete right bundle branch block Left anterior fascicular block Minimal voltage criteria for LVH, may be normal variant T wave abnormality, consider lateral ischemia Abnormal ECG flipped t waves in I and aVL seen on prior Otherwise no significant change Confirmed by Deno Etienne [phone]) on 05/13/2018 3:17:36 PM   Radiology Dg Chest 2 View  Result Date: 05/13/2018 CLINICAL DATA:  Shortness of Breath EXAM: CHEST - 2 VIEW COMPARISON:  05/09/2018 FINDINGS: Cardiac shadow is stable. Elevation of the left hemidiaphragm is again seen. Aortic calcifications are again noted. Bibasilar atelectasis are again  identified slightly increased on the right when compared with the prior exam. No new focal infiltrate or effusion is seen. Degenerative changes of the thoracic spine are noted. IMPRESSION: Slight increase in the degree of right basilar atelectasis. Electronically Signed   By: Inez Catalina M.D.   On: 05/13/2018 16:16    Procedures Procedures (including critical care time)  Medications Ordered in ED Medications  furosemide (LASIX) injection 20 mg (  20 mg Intravenous Given 05/13/18 1951)     Initial Impression / Assessment and Plan / ED Course  I have reviewed the triage vital signs and the nursing notes.  Pertinent labs & imaging results that were available during my care of the patient were reviewed by me and considered in my medical decision making (see chart for details).     84 year old male with PMH of referred A. fib on metoprolol 100mg  qd, pulmonary artery aneurysm, pulmonary HTN, severe aortic stenosis, HFpEF last known EF 75-80% on torsemide, history of CVA, CKD stage III who presents with chief complaint of shortness of breath.  History as above.  DDX includes pneumonia, CHF exacerbation, worsening pulmonary hypertension, or failure to thrive.  Labs and imaging performed which revealed shoulder BNP 974, troponin 0 0.03.  Creatinine 1.8, INR 2.45.  CXR shows atelectasis, no significant pulmonary edema.  EKG with no signs of ST elevation or ST deviation.  Patient with clinical CHF exacerbation.  Patient given Lasix.  Admitted to hospitalist service for further management.  Recommends SNF placement.  Patient seen in conjunction with my attending Dr. Ashok Cordia   Final Clinical Impressions(s) / ED Diagnoses   Final diagnoses:  Acute on chronic diastolic congestive heart failure [facility])    ED Discharge Orders    None       Keenan Bachelor, MD 05/13/18 2006    Lajean Saver, MD 05/13/18 2037    Lajean Saver, MD 06/09/18 1138

## 2018-05-13 NOTE — ED Notes (Signed)
Patient transported to X-ray 

## 2018-05-13 NOTE — Progress Notes (Signed)
ANTICOAGULATION CONSULT NOTE - Initial Consult  Pharmacy Consult for warfarin Indication: atrial fibrillation  Allergies  Allergen Reactions  . Ibuprofen     REACTION: causes low blood pressure    Vital Signs: Temp: 97.8 F (36.6 C) (10/07 1525) BP: 149/101 (10/07 1800) Pulse Rate: 108 (10/07 1800)  Labs: Recent Labs    05/13/18 1534 05/13/18 1651  HGB 12.9*  --   HCT 41.9  --   PLT 191  --   LABPROT  --  26.3*  INR  --  2.45  CREATININE 1.80*  --     Estimated Creatinine Clearance: 29.1 mL/min (A) (by C-G formula based on SCr of 1.8 mg/dL (H)).   Medical History: Past Medical History:  Diagnosis Date  . 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 Aortic stenosis   . Atrial fibrillation ([place])   . BPH (benign prostatic hypertrophy)   . Congestive heart failure, unspecified   . CVA (cerebral infarction)   . DJD (degenerative joint disease)   . Generalized osteoarthrosis, unspecified site   . Gout, unspecified   . Pulmonary hypertension ([place])   . Pure hypercholesterolemia   . Renal insufficiency   . Unspecified cerebral artery occlusion with cerebral infarction   . Unspecified essential hypertension   . Unspecified hemorrhoids without mention of complication     Medications:  Scheduled:  . [START ON 05/14/2018] allopurinol  100 mg Oral Daily  . amoxicillin-clavulanate  1 tablet Oral BID  . atorvastatin  20 mg Oral QHS  . brinzolamide  1 drop Both Eyes BID  . Calcifediol ER  30 mcg Oral QHS  . furosemide  40 mg Intravenous Once  . guaiFENesin  600 mg Oral BID  . ipratropium-albuterol  3 mL Nebulization STAT  . [START ON 05/14/2018] irbesartan  300 mg Oral Daily  . latanoprost  1 drop Both Eyes QHS  . [START ON 05/14/2018] metoprolol succinate  100 mg Oral Q1200  . pantoprazole  40 mg Oral Q supper  . pilocarpine  1 drop Left Eye BID  . [START ON 05/14/2018] potassium chloride  40 mEq Oral Q1200   And  . potassium chloride  20 mEq Oral QHS  . sodium chloride  flush  3 mL Intravenous Q12H  . [START ON 05/14/2018] spironolactone  25 mg Oral Daily  . tamsulosin  0.4 mg Oral QHS  . verapamil  120 mg Oral QHS    Assessment: Pt is 4 YOM presenting for ongoing assessment and management of Afib with weakness and SOB. Pt seen in anticoag clinic for warfarin management. PTA warfarin dosing: 5mg  Monday and Friday; 2.5mg  Sunday, Tuesday, Wednesday, Thursday, Saturday at noon. Has not had warfarin dose today; INR therapeutic at 2.45.  INR goal 2-3.    Goal of Therapy:  INR 2-3 Monitor platelets by anticoagulation protocol: Yes   Plan:  -Warfarin 5 mg today -Daily INR  Danner Paulding J Sigourney Portillo 05/13/2018,8:56 PM

## 2018-05-13 NOTE — H&P (Signed)
History and Physical    [name] [name] LNL:[id number] DOB: 1925-10-07 DOA: 05/13/2018  Referring MD/NP/PA: Keenan Bachelor, MD(resident) PCP: Biagio Borg, MD  Patient coming from: Cardiology clinic  Chief Complaint: Shorthness of breath  I have personally briefly reviewed patient's old medical records in [place]   HPI: [name] [name] is a 84 y.o. male with medical history significant of diastolic CHF last EF 75 to 80%, AS, pulmonary artery aneurysm, A. fib on chronic anticoagulation, oxygen dependent on 2.5 L, HTN, BPH, and CKD stage III; who presents with progressively worsening shortness of breath over the last 3 weeks.  At baseline patient lives at home alone and ambulates with the use of a rolling walker.  He he reports even moving from the bed to a chair makes him extremely winded as though he may be taking his last breath.  At night he reports having to sit up due to shortness of breath.  He has had a cough with increased mucus production and intermittent wheezing.  Daughter present at bedside who comes and checks on him and noticed that he recently fell approximately 1 week ago after losing his balance inbetween the tub and the commode, but patient denies any loss of consciousness or trauma to his head.  Other associated symptoms include malaise, leg swelling, weight loss of approximately 20 pounds over the last year or more.  Denies any recent medication changes, fever, chest pain, palpitations nausea, vomiting, diarrhea, or dysuria symptoms.  He reports taking torosemide 40 mg twice daily as advised.  Due to symptoms he was seen at his pulmonologist office 4 days ago and was started on Augmentin at that time for possible pneumonia.  Since that time he has not noticed any improvement of his symptoms.  He was seen today in cardiology clinic and sent in for further evaluation.  He is followed by Dr. Percival Spanish of cardiology in the outpatient setting.  ED Course: Upon admission into  the emergency department patient was noted to be afebrile, pulse 84-1 09, respirations 20-23, blood pressures 110/68-149/101, and O2 saturations 93 to 97% on 2.5 L of nasal cannula oxygen..  Patient was ambulated with O2 saturations noted to drop as low as 84%.  Labs revealed WBC 8, hemoglobin 12.9, MCV 103, BUN 27, creatinine 1.8, and INR 2.45.  chest x-ray showed increased right basilar atelectasis, but no signs of edema.  Based off history and physical exam patient was given initially 20 mg of Lasix IV.  TRH called to admit  Review of Systems  Constitutional: Positive for malaise/fatigue and weight loss. Negative for chills and fever.  HENT: Positive for congestion and hearing loss.   Eyes: Negative for double vision and photophobia.  Respiratory: Positive for cough, sputum production, shortness of breath and wheezing.   Cardiovascular: Positive for orthopnea, leg swelling and PND. Negative for chest pain.  Gastrointestinal: Negative for diarrhea, nausea and vomiting.  Genitourinary: Negative for dysuria and flank pain.  Musculoskeletal: Positive for falls, joint pain and myalgias.  Skin: Negative for itching and rash.  Neurological: Positive for weakness. Negative for loss of consciousness.  Psychiatric/Behavioral: Negative for memory loss and substance abuse.    Past Medical History:  Diagnosis Date  . 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 Aortic stenosis   . Atrial fibrillation ([place])   . BPH (benign prostatic hypertrophy)   . Congestive heart failure, unspecified   . CVA (cerebral infarction)   . DJD (degenerative joint disease)   .  Generalized osteoarthrosis, unspecified site   . Gout, unspecified   . Pulmonary hypertension ([place])   . Pure hypercholesterolemia   . Renal insufficiency   . Unspecified cerebral artery occlusion with cerebral infarction   . Unspecified essential hypertension   . Unspecified hemorrhoids without mention of complication     Past Surgical History:    Procedure Laterality Date  . APPENDECTOMY    . CATARACT EXTRACTION    . cosmetic eye surgery    . INGUINAL HERNIA REPAIR       reports that he quit smoking about 14 years ago. His smoking use included cigarettes and cigars. He quit after 3.00 years of use. He has never used smokeless tobacco. He reports that he does not use drugs. His alcohol history is not on file.  Allergies  Allergen Reactions  . Ibuprofen     REACTION: causes low blood pressure    No family history on file.  Prior to Admission medications   Medication Sig Start Date End Date Taking? Authorizing Provider  allopurinol (ZYLOPRIM) 100 MG tablet TAKE 1 TABLET(100 MG) BY MOUTH DAILY. Patient taking differently: Take 100 mg by mouth daily.  02/26/18  Yes Biagio Borg, MD  amoxicillin-clavulanate (AUGMENTIN) 875-125 MG tablet Take 1 tablet by mouth 2 (two) times daily. 05/09/18  Yes Fenton Foy, NP  atorvastatin (LIPITOR) 20 MG tablet Take 1 tablet (20 mg total) by mouth daily. Patient taking differently: Take 20 mg by mouth at bedtime.  10/09/17  Yes Hochrein, Jeneen Rinks, MD  bimatoprost (LUMIGAN) 0.01 % SOLN Place 1 drop into both eyes at bedtime.   Yes [provider]  brinzolamide (AZOPT) 1 % ophthalmic suspension Place 1 drop into both eyes 2 (two) times daily.     Yes [provider]  Calcifediol ER (RAYALDEE) 30 MCG CPCR Take 30 mcg by mouth at bedtime.    Yes [provider]  eplerenone (INSPRA) 25 MG tablet Take 1 tablet (25 mg total) by mouth daily. 10/29/17  Yes Minus Breeding, MD  metoprolol succinate (TOPROL-XL) 100 MG 24 hr tablet TAKE 1 TABLET BY MOUTH DAILY WITH OR IMMEDIATELY FOLLOWING A MEAL Patient taking differently: Take 100 mg by mouth daily at 12 noon.  09/04/17  Yes Minus Breeding, MD  Multiple Vitamin (MULTIVITAMIN) tablet Take 1 tablet by mouth daily.   Yes [provider]  omeprazole (PRILOSEC) 20 MG capsule TAKE 1 CAPSULE(20 MG) BY MOUTH DAILY Patient taking  differently: Take 20 mg by mouth daily as needed (reflux).  05/10/18  Yes Fenton Foy, NP  pilocarpine (PILOCAR) 4 % ophthalmic solution Place 1 drop into the left eye 2 (two) times daily.    Yes [provider]  potassium chloride SA (K-DUR,KLOR-CON) 20 MEQ tablet TAKE 3 TABLETS BY MOUTH DAILY Patient taking differently: Take 20-40 mEq by mouth See admin instructions. Take 2 tablets at noon, and 1 tablet at bedtime 10/01/17  Yes Minus Breeding, MD  tamsulosin (FLOMAX) 0.4 MG CAPS capsule Take 1 capsule (0.4 mg total) by mouth 2 (two) times daily. Patient taking differently: Take 0.4 mg by mouth at bedtime.  05/06/18  Yes Biagio Borg, MD  telmisartan (MICARDIS) 80 MG tablet Take 1 tablet (80 mg total) by mouth daily. Patient taking differently: Take 80 mg by mouth daily at 12 noon.  10/01/17  Yes Minus Breeding, MD  torsemide (DEMADEX) 20 MG tablet TAKE 2 TABLETS BY MOUTH TWICE DAILY EVERY Patient taking differently: Take 40 mg by mouth 2 (two)  times daily.  04/29/18  Yes Sherren Mocha, MD  verapamil (CALAN-SR) 120 MG CR tablet TAKE 1 TABLET(120 MG) BY MOUTH AT BEDTIME Patient taking differently: Take 120 mg by mouth at bedtime.  04/29/18  Yes Minus Breeding, MD  warfarin (COUMADIN) 5 MG tablet TAKE 1/2 TO 1 TABLET BY MOUTH DAILY AS DIRECTED BY COUMADIN CLINIC Patient taking differently: Take 2.5-5 mg by mouth See admin instructions. Take 1 tablet (5mg ) on Mon and Fri. Take 1/2 tablet (2.5mg ) all other days at noon 04/30/18  Yes Minus Breeding, MD    Physical Exam:  Constitutional: Elderly male who appears to be in some mild respiratory distress. Vitals:   05/13/18 1525 05/13/18 1700 05/13/18 1800  BP: 140/90 (!) 145/110 (!) 149/101  Pulse: 84 (!) 109 (!) 108  Resp: 20 (!) 23 (!) 22  Temp: 97.8 F (36.6 C)    SpO2: 93% 97% 93%   Eyes: PERRL, lids and conjunctivae normal ENMT: Mucous membranes are dry. Posterior pharynx clear of any exudate or lesions. Neck: normal, supple,  no masses, no thyromegaly.  Positive JVD appreciated. Respiratory: Mildly tachypneic with decreased overall aeration.  No significant wheezes or rales appreciated.  Patient on 2.5 L of nasal cannula oxygen with O2 saturations 92%. Cardiovascular: Irregularly irregular, no murmurs / rubs / gallops.  Trace lower extremity edema. 2+ pedal pulses. No carotid bruits.  Abdomen: no tenderness, no masses palpated. No hepatosplenomegaly. Bowel sounds positive.  Musculoskeletal: no clubbing / cyanosis. No joint deformity upper and lower extremities. Good ROM, no contractures. Normal muscle tone.  Skin: no rashes, lesions, ulcers. No induration Neurologic: CN 2-12 grossly intact. Sensation intact, DTR normal. Strength 5/5 in all 4.  Psychiatric: Normal judgment and insight. Alert and oriented x 3. Normal mood.     Labs on Admission: I have personally reviewed following labs and imaging studies  CBC: Recent Labs  Lab 05/13/18 1534  WBC 8.0  HGB 12.9*  HCT 41.9  MCV 103.2*  PLT 580   Basic Metabolic Panel: Recent Labs  Lab 05/13/18 1534  NA 139  K 4.0  CL 105  CO2 22  GLUCOSE 127*  BUN 27*  CREATININE 1.80*  CALCIUM 9.3   GFR: Estimated Creatinine Clearance: 29.1 mL/min (A) (by C-G formula based on SCr of 1.8 mg/dL (H)). Liver Function Tests: No results for input(s): AST, ALT, ALKPHOS, BILITOT, PROT, ALBUMIN in the last 168 hours. No results for input(s): LIPASE, AMYLASE in the last 168 hours. No results for input(s): AMMONIA in the last 168 hours. Coagulation Profile: Recent Labs  Lab 05/13/18 1651  INR 2.45   Cardiac Enzymes: No results for input(s): CKTOTAL, CKMB, CKMBINDEX, TROPONINI in the last 168 hours. BNP (last 3 results) No results for input(s): PROBNP in the last 8760 hours. HbA1C: No results for input(s): HGBA1C in the last 72 hours. CBG: No results for input(s): GLUCAP in the last 168 hours. Lipid Profile: No results for input(s): CHOL, HDL, LDLCALC, TRIG,  CHOLHDL, LDLDIRECT in the last 72 hours. Thyroid Function Tests: No results for input(s): TSH, T4TOTAL, FREET4, T3FREE, THYROIDAB in the last 72 hours. Anemia Panel: No results for input(s): VITAMINB12, FOLATE, FERRITIN, TIBC, IRON, RETICCTPCT in the last 72 hours. Urine analysis:    Component Value Date/Time   COLORURINE YELLOW 08/21/2013 1700   APPEARANCEUR CLEAR 08/21/2013 1700   LABSPEC 1.015 08/21/2013 1700   PHURINE 5.0 08/21/2013 1700   GLUCOSEU NEGATIVE 08/21/2013 1700   HGBUR NEGATIVE 08/21/2013 1700   BILIRUBINUR NEGATIVE 08/21/2013 1700  KETONESUR NEGATIVE 08/21/2013 1700   UROBILINOGEN 0.2 08/21/2013 1700   NITRITE NEGATIVE 08/21/2013 1700   LEUKOCYTESUR NEGATIVE 08/21/2013 1700   Sepsis Labs: No results found for this or any previous visit (from the past 240 hour(s)).   Radiological Exams on Admission: Dg Chest 2 View  Result Date: 05/13/2018 CLINICAL DATA:  Shortness of Breath EXAM: CHEST - 2 VIEW COMPARISON:  05/09/2018 FINDINGS: Cardiac shadow is stable. Elevation of the left hemidiaphragm is again seen. Aortic calcifications are again noted. Bibasilar atelectasis are again identified slightly increased on the right when compared with the prior exam. No new focal infiltrate or effusion is seen. Degenerative changes of the thoracic spine are noted. IMPRESSION: Slight increase in the degree of right basilar atelectasis. Electronically Signed   By: Inez Catalina M.D.   On: 05/13/2018 16:16     EKG: Independently reviewed.  Atrial fibrillation 87 bpm  Assessment/Plan Suspected diastolic congestive heart failure: Acute.  Patient presents with progressively worsening shortness of breath.  He reports having orthopnea and PND.  Physical exam notes mild lower extremity edema.  BNP elevated at 974.3.  Last echocardiogram noted EF of 75 to 80% in 03/2017 moderate aortic stenosis.  Patient is followed in the outpatient setting by Dr. Percival Spanish. - Admit to a telemetry bed - Heart  failure orders set  initiated  - Strict I&Os and daily weights - Elevate lower extremities - Check TSH - Additional 40 mg of Lasix IV to make a total of 60 mg IV given. - Reassess in a.m. and adjust diuresis as needed. - Check echocardiogram in a.m.  - Change medications as needed - Message sent for cardiology to eval in a.m.   Respiratory failure with hypoxia:Acute on chronic.  At baseline patient on 2.5 L of oxygen 24/7.  Patient was noted to have O2 saturations as low as 84% with ambulation.  Suspect pulmonary embolus less likely.   - Continuous pulse oximetry with nasal cannula oxygen as needed to keep O2 saturations >92% - Physical therapy to ambulate  Possible community-acquired pneumonia vs Brochitis vs atelectasis: Acute.  Patient reports increased cough with mucus production.  Seen to have increasing right basilar atelectasis and no reported rib fracture seen on initial chest x-ray. Pneumonia vs bronchitis vs atelectasis.  - Check sputum culture, urine Legionella, urine strep - Continue Augmentin - Albuterol neb prn SOB/wheezing - Mucinex - Incentive spirometry  Aortic stenosis, pulmonary artery hypertension, pulmonary artery aneurysm: Chronic.  Previously evaluated last year and thought not to be a candidate for TAVR due to advanced age and comorbidities.  Last echocardiogram 07/2017 noted moderate stenosis with mean gradient 28 mm Hg with peak gradient 47 mm Hg.  Generalized weakness: Patient noted to have increased weakness with recent fall. - Physical therapy as seen above  Atrial fibrillation on chronic anticoagulation: On admission heart rates up to 109 with INR therapeutic at 2.45.CHA2DS2-VASc score = 6. - Continue verapamil, metoprolol, and Coumadin per pharmacy  Essential hypertension: Blood pressures appear stable at this time. - Continue metoprolol, pharmacy substitution of spironolactone, and pharmacy substitution of irbesartan  Macrocytic anemia: Patient  presents with hemoglobin of 12.9 with MCV elevated at 103. - Check vitamin B12 and folate in a.m.  Chronic kidney disease stage III/IV: Chronic.  Patient presents with creatinine of 1.8 with BUN 27.  At baseline  creatinine noted to be around 1.8-2. - Continue to monitor   Hyperlipidemia  - Continue atorvastatin  BPH - Continue tamsulosin  Gout - Continue allopurinol  Glaucoma patient previously diagnosed with primary open-angle glaucoma. - Continue home regimen  DVT prophylaxis: Coumadin Code Status: Full Family Communication: Discussed plan of care with the patient and daughter present at bedside Disposition Plan: To be determined Consults called: None Admission status: Inpatient  Norval Morton MD [facility] Pager [phone]   If 7PM-7AM, please contact night-coverage [email or web address] Password [email or web address]  05/13/2018, 7:52 PM    \

## 2018-05-13 NOTE — ED Notes (Addendum)
Pt ambulated in hallway with SBA and walker (uses a walker at home). Prior to ambulation spO2 was 94% on 3L (HR 110bpm). Midway through ambulation, spO2% dropped to 90% (HR 123bpm). Pt complained of feeliong increased ShOB; spO2% noted to be 84% after returning to bed, RN increased O2 to 4L resulting in spO2 returning to 95%.

## 2018-05-13 NOTE — ED Notes (Signed)
ED Provider at bedside. 

## 2018-05-14 ENCOUNTER — Inpatient Hospital Stay (HOSPITAL_COMMUNITY): Payer: Medicare Other

## 2018-05-14 ENCOUNTER — Encounter (HOSPITAL_COMMUNITY): Payer: Self-pay | Admitting: General Practice

## 2018-05-14 DIAGNOSIS — I361 Nonrheumatic tricuspid (valve) insufficiency: Secondary | ICD-10-CM

## 2018-05-14 DIAGNOSIS — I340 Nonrheumatic mitral (valve) insufficiency: Secondary | ICD-10-CM

## 2018-05-14 DIAGNOSIS — I272 Pulmonary hypertension, unspecified: Secondary | ICD-10-CM

## 2018-05-14 DIAGNOSIS — I4811 Longstanding persistent atrial fibrillation: Secondary | ICD-10-CM

## 2018-05-14 DIAGNOSIS — I350 Nonrheumatic aortic (valve) stenosis: Secondary | ICD-10-CM

## 2018-05-14 DIAGNOSIS — J9621 Acute and chronic respiratory failure with hypoxia: Secondary | ICD-10-CM

## 2018-05-14 DIAGNOSIS — Z7901 Long term (current) use of anticoagulants: Secondary | ICD-10-CM

## 2018-05-14 DIAGNOSIS — I10 Essential (primary) hypertension: Secondary | ICD-10-CM

## 2018-05-14 DIAGNOSIS — I281 Aneurysm of pulmonary artery: Secondary | ICD-10-CM

## 2018-05-14 LAB — BASIC METABOLIC PANEL
Anion gap: 10 (ref 5–15)
BUN: 25 mg/dL — ABNORMAL HIGH (ref 8–23)
CALCIUM: 9 mg/dL (ref 8.9–10.3)
CO2: 26 mmol/L (ref 22–32)
CREATININE: 1.73 mg/dL — AB (ref 0.61–1.24)
Chloride: 105 mmol/L (ref 98–111)
GFR calc Af Amer: 38 mL/min — ABNORMAL LOW (ref >60)
GFR, EST NON AFRICAN AMERICAN: 33 mL/min — AB (ref >60)
GLUCOSE: 116 mg/dL — AB (ref 70–99)
Potassium: 3.7 mmol/L (ref 3.5–5.1)
Sodium: 141 mmol/L (ref 135–145)

## 2018-05-14 LAB — MAGNESIUM: Magnesium: 2 mg/dL (ref 1.7–2.4)

## 2018-05-14 LAB — ECHOCARDIOGRAM COMPLETE
Height: 71 in
Weight: 2784 oz

## 2018-05-14 LAB — URINALYSIS, ROUTINE W REFLEX MICROSCOPIC
BILIRUBIN URINE: NEGATIVE
Glucose, UA: NEGATIVE mg/dL
Hgb urine dipstick: NEGATIVE
Ketones, ur: NEGATIVE mg/dL
Leukocytes, UA: NEGATIVE
Nitrite: NEGATIVE
Protein, ur: NEGATIVE mg/dL
SPECIFIC GRAVITY, URINE: 1.006 (ref 1.005–1.030)
pH: 6 (ref 5.0–8.0)

## 2018-05-14 LAB — CBC WITH DIFFERENTIAL/PLATELET
Abs Immature Granulocytes: 0.03 10*3/uL (ref 0.00–0.07)
Basophils Absolute: 0.1 10*3/uL (ref 0.0–0.1)
Basophils Relative: 1 %
EOS ABS: 0.2 10*3/uL (ref 0.0–0.5)
Eosinophils Relative: 3 %
HEMATOCRIT: 41.6 % (ref 39.0–52.0)
Hemoglobin: 13.2 g/dL (ref 13.0–17.0)
Immature Granulocytes: 0 %
LYMPHS ABS: 1.2 10*3/uL (ref 0.7–4.0)
Lymphocytes Relative: 15 %
MCH: 32.3 pg (ref 26.0–34.0)
MCHC: 31.7 g/dL (ref 30.0–36.0)
MCV: 101.7 fL — AB (ref 80.0–100.0)
MONO ABS: 0.7 10*3/uL (ref 0.1–1.0)
Monocytes Relative: 8 %
Neutro Abs: 6.1 10*3/uL (ref 1.7–7.7)
Neutrophils Relative %: 73 %
Platelets: 182 10*3/uL (ref 150–400)
RBC: 4.09 MIL/uL — ABNORMAL LOW (ref 4.22–5.81)
RDW: 16.7 % — AB (ref 11.5–15.5)
WBC: 8.3 10*3/uL (ref 4.0–10.5)

## 2018-05-14 LAB — VITAMIN B12: Vitamin B-12: 758 pg/mL (ref 180–914)

## 2018-05-14 LAB — TROPONIN I
TROPONIN I: 0.03 ng/mL — AB (ref <0.03)
Troponin I: 0.03 ng/mL (ref <0.03)

## 2018-05-14 LAB — STREP PNEUMONIAE URINARY ANTIGEN: Strep Pneumo Urinary Antigen: NEGATIVE

## 2018-05-14 LAB — PROTIME-INR
INR: 2.34
PROTHROMBIN TIME: 25.4 s — AB (ref 11.4–15.2)

## 2018-05-14 MED ORDER — ORAL CARE MOUTH RINSE
15.0000 mL | Freq: Two times a day (BID) | OROMUCOSAL | Status: DC
  Administered 2018-05-15 – 2018-05-16 (×3): 15 mL via OROMUCOSAL

## 2018-05-14 MED ORDER — FUROSEMIDE 10 MG/ML IJ SOLN
60.0000 mg | Freq: Once | INTRAMUSCULAR | Status: AC
  Administered 2018-05-14: 60 mg via INTRAVENOUS
  Filled 2018-05-14: qty 6

## 2018-05-14 MED ORDER — CALCITRIOL 0.25 MCG PO CAPS
0.2500 ug | ORAL_CAPSULE | Freq: Every day | ORAL | Status: DC
  Administered 2018-05-14: 0.25 ug via ORAL
  Filled 2018-05-14: qty 1

## 2018-05-14 MED ORDER — CALCIFEDIOL ER 30 MCG PO CPCR
30.0000 ug | ORAL_CAPSULE | Freq: Every day | ORAL | Status: DC
  Administered 2018-05-15 – 2018-05-16 (×2): 30 ug via ORAL
  Filled 2018-05-14 (×2): qty 1

## 2018-05-14 MED ORDER — ZOLPIDEM TARTRATE 5 MG PO TABS
5.0000 mg | ORAL_TABLET | Freq: Once | ORAL | Status: AC
  Administered 2018-05-14: 5 mg via ORAL
  Filled 2018-05-14: qty 1

## 2018-05-14 MED ORDER — WARFARIN SODIUM 2.5 MG PO TABS
2.5000 mg | ORAL_TABLET | Freq: Once | ORAL | Status: AC
  Administered 2018-05-14: 2.5 mg via ORAL
  Filled 2018-05-14: qty 1

## 2018-05-14 NOTE — Evaluation (Signed)
Physical Therapy Evaluation Patient Details Name: [name] [name] MRN: [id number] DOB: 10-17-1925 Today's Date: 05/14/2018   History of Present Illness  Pt 84yo male presenting with worsening SOB and productive cough over last three weeks. Bil pleural effusion noted. PMhx: CHF (EF 75-80%), HTN, BPH, CKD III, Afib, O2 dependent 2.5L, CVA, gout, OA, pulm artery aneurysm, and aortic stenosis.   Clinical Impression  Pt pleasant on arrival, pleased with how delicious the cookies are here. Pt bed mobility mod I and transfers min guard. Ambulation deferred this evaluation secondary to labored breathing and HR up to 145 sitting EOB with PACs and PVCs. Pt on 3L [place] on arrival with SpO2 93%, sitting EOB pt desat to 86% with need to bump SpO2 up to 6L for recovery to SpO2 92%. Pt with decreased activity tolerance, strength and balance with limited vision (see PT problem list below). Pt will benefit from skilled therapy to address above deficits to maximize functional mobility, independence and safety. Recommend sub-acute rehab at this time as pt does not have 24/7 supervision at home. Pt and daughter agree.     Follow Up Recommendations SNF;Supervision/Assistance - 24 hour    Equipment Recommendations  None recommended by PT    Recommendations for Other Services       Precautions / Restrictions Precautions Precautions: Fall Precaution Comments: watch HR and O2      Mobility  Bed Mobility Overal bed mobility: Modified Independent             General bed mobility comments: HOB elevated  Transfers Overall transfer level: Needs assistance Equipment used: Rolling walker (2 wheeled) Transfers: Sit to/from [facility] Sit to Stand: Min guard Stand pivot transfers: Min guard       General transfer comment: increased time, verbal cues, elevated height to simulate home, tactile cues for direction. Pt required increased time EoB with increased O2 demand with 6L to achieve 93%  and fluctuant HR  Ambulation/Gait             General Gait Details: deferred secondary to HR up to 145 with sitting EOB with PAC and PVC  Stairs            Wheelchair Mobility    Modified Rankin (Stroke Patients Only)       Balance Overall balance assessment: Needs assistance   Sitting balance-Leahy Scale: Good       Standing balance-Leahy Scale: Fair                               Pertinent Vitals/Pain Pain Assessment: No/denies pain    Home Living Family/patient expects to be discharged to:: Private residence Living Arrangements: Alone Available Help at Discharge: Family;Available PRN/intermittently Type of Home: House Home Access: Stairs to enter Entrance Stairs-Rails: Psychiatric nurse of Steps: 3 Home Layout: One level Home Equipment: Grab bars - tub/shower;Cane - single point;Walker - 4 wheels;Shower seat Additional Comments: pt reports one fall    Prior Function Level of Independence: Independent with assistive device(s);Needs assistance   Gait / Transfers Assistance Needed: walking with walker  ADL's / Homemaking Assistance Needed: daughter does the cooking and cleaning pt bathes and dresses himself        Hand Dominance        Extremity/Trunk Assessment   Upper Extremity Assessment Upper Extremity Assessment: Generalized weakness    Lower Extremity Assessment Lower Extremity Assessment: Generalized weakness    Cervical / Trunk Assessment  Cervical / Trunk Assessment: Kyphotic  Communication   Communication: No difficulties  Cognition Arousal/Alertness: Awake/alert Behavior During Therapy: WFL for tasks assessed/performed Overall Cognitive Status: Within Functional Limits for tasks assessed                                        General Comments      Exercises     Assessment/Plan    PT Assessment Patient needs continued PT services  PT Problem List Decreased  strength;Decreased mobility;Decreased activity tolerance;Decreased balance;Decreased knowledge of use of DME;Cardiopulmonary status limiting activity       PT Treatment Interventions DME instruction;Functional mobility training;Balance training;Patient/family education;Therapeutic activities;Gait training;Therapeutic exercise    PT Goals (Current goals can be found in the Care Plan section)  Acute Rehab PT Goals Patient Stated Goal: return home PT Goal Formulation: With patient/family Time For Goal Achievement: 05/28/18 Potential to Achieve Goals: Fair    Frequency Min 3X/week   Barriers to discharge Decreased caregiver support      Co-evaluation               AM-PAC PT "6 Clicks" Daily Activity  Outcome Measure Difficulty turning over in bed (including adjusting bedclothes, sheets and blankets)?: A Little Difficulty moving from lying on back to sitting on the side of the bed? : A Lot Difficulty sitting down on and standing up from a chair with arms (e.g., wheelchair, bedside commode, etc,.)?: A Little Help needed moving to and from a bed to chair (including a wheelchair)?: A Little Help needed walking in hospital room?: A Little Help needed climbing 3-5 steps with a railing? : A Lot 6 Click Score: 16    End of Session Equipment Utilized During Treatment: Gait belt Activity Tolerance: Patient tolerated treatment well Patient left: in chair;with call bell/phone within reach;with chair alarm set;with family/visitor present Nurse Communication: Mobility status PT Visit Diagnosis: Muscle weakness (generalized) (M62.81);Other abnormalities of gait and mobility (R26.89)    Time: 0277-4128 PT Time Calculation (min) (ACUTE ONLY): 31 min   Charges:   PT Evaluation $PT Eval Moderate Complexity: [place], [place]  Acute Rehab [phone]   Samuella Bruin 05/14/2018, 1:24 PM

## 2018-05-14 NOTE — Progress Notes (Signed)
   05/13/18 2122  Vitals  Temp (!) 97.4 F (36.3 C)  Temp Source Oral  BP (!) 162/102  MAP (mmHg) 119  BP Method Automatic  Patient Position (if appropriate) Lying  Pulse Rate 88  Resp 18  Oxygen Therapy  SpO2 90 %  O2 Device Nasal Cannula  O2 Flow Rate (L/min) 3 L/min  Height and Weight  Height 5\' 11"  (1.803 m)  Weight 79.2 kg (scale b)  Type of Scale Used Standing  BSA (Calculated - sq m) 1.99 sq meters  BMI (Calculated) 24.36  Weight in (lb) to have BMI = 25 178.9  Admitted pt to rm 3E26 from ED, pt alert and oriented, denied pain at this time, oriented to room, call bell placed within reach, placed on cardiac monitor, CCMD made aware.

## 2018-05-14 NOTE — Plan of Care (Signed)

## 2018-05-14 NOTE — Progress Notes (Signed)
PROGRESS NOTE  [name] [name] ZTI:[id number] DOB: November 20, 1925 DOA: 05/13/2018 PCP: Biagio Borg, MD  HPI/Recap of past 24 hours: [name] [name] is a 84 y.o. male with medical history significant of diastolic CHF last EF 75 to 80%, AS, pulmonary artery aneurysm, A. fib on chronic anticoagulation, oxygen dependent on 2.5 L, HTN, BPH, and CKD stage III; who presents with progressively worsening shortness of breath over the last 3 weeks. Due to symptoms he was seen at his pulmonologist office 4 days ago and was started on Augmentin at that time for possible pneumonia.  Since that time he has not noticed any improvement of his symptoms.  He was seen today in cardiology clinic and sent in for further evaluation.  He is followed by Dr. Percival Spanish of cardiology in the outpatient setting.  05/14/18: Patient seen and examined at the bedside.  He reports his breathing is improving with breathing treatments, however he is on 3 L of oxygen by nasal cannula and has conversational dyspnea.  Cardiology consulted and following.  Recommendations for medical management.    Assessment/Plan: Principal Problem:   CHF (congestive heart failure) (HCC) Active Problems:   HYPERCHOLESTEROLEMIA   GOUT   Essential hypertension   Pulmonary hypertension (HCC)   Aneurysm of pulmonary artery (HCC)   ATRIAL FIBRILLATION   Long term current use of anticoagulant   Glaucoma   CKD (chronic kidney disease) stage 3, GFR 30-59 ml/min (HCC)   BPH (benign prostatic hyperplasia)   Acute on chronic respiratory failure with hypoxia (HCC)  Acute on chronic diastolic heart failure Continue strict I's and O's and daily weight Continue IV Lasix Last BNP 967 Troponins negative Twelve-lead EKG independently reviewed revealed A. fib with PVCs with no specific ST-T changes 2D echo ordered and results are pending Cardiology consulted and following  Possible community-acquired pneumonia Started treatment outpatient with  Augmentin Stable Febrile with no leukocytosis On Augmentin, complete course  CKD 3 Appears to be at his baseline Creatinine 1.73 Continue to avoid nephrotoxic agents/dehydration/hypotension Repeat BMP in the morning  Chronic A. fib On oral anticoagulation Coumadin INR 2.34 Continue Coumadin  Moderate aortic stenosis Per cardiology not a candidate for TAVR  Severe pulmonary hypertension Cardiology following  Hypertension Blood pressure stable Continue home antihypertensive medications   Code Status: Full code  Family Communication:   Disposition Plan: Home possibly tomorrow 05/15/18.   Consultants:  Cardiology  Procedures:  None  Antimicrobials:  Augmentin  DVT prophylaxis: Coumadin   Objective: Vitals:   05/14/18 0915 05/14/18 1054 05/14/18 1220 05/14/18 1336  BP: 114/83  (!) 132/110 112/85  Pulse: 67 (!) 102 (!) 146 (!) 123  Resp: 20  20   Temp: 97.6 F (36.4 C)  98 F (36.7 C)   TempSrc: Oral  Oral   SpO2: 91%  97%   Weight:      Height:        Intake/Output Summary (Last 24 hours) at 05/14/2018 1540 Last data filed at 05/14/2018 1302 Gross per 24 hour  Intake 1020 ml  Output 1700 ml  Net -680 ml   Filed Weights   05/13/18 2122 05/14/18 0347  Weight: 79.2 kg 78.9 kg    Exam:  . General: 84 y.o. year-old male well developed well nourished in no acute distress.  Alert and oriented x3. . Cardiovascular: Regular rate and rhythm with no rubs or gallops.  No thyromegaly or JVD noted.   Marland Kitchen Respiratory: Diffuse rales bilaterally with no wheezes.. Good inspiratory effort. Marland Kitchen  Abdomen: Soft nontender nondistended with normal bowel sounds x4 quadrants. . Musculoskeletal: No lower extremity edema. 2/4 pulses in all 4 extremities. . Skin: No ulcerative lesions noted or rashes, . Psychiatry: Mood is appropriate for condition and setting   Data Reviewed: CBC: Recent Labs  Lab 05/13/18 1534 05/14/18 0913  WBC 8.0 8.3  NEUTROABS  --  6.1  HGB  12.9* 13.2  HCT 41.9 41.6  MCV 103.2* 101.7*  PLT 191 323   Basic Metabolic Panel: Recent Labs  Lab 05/13/18 1534 05/14/18 0357 05/14/18 0913  NA 139 141  --   K 4.0 3.7  --   CL 105 105  --   CO2 22 26  --   GLUCOSE 127* 116*  --   BUN 27* 25*  --   CREATININE 1.80* 1.73*  --   CALCIUM 9.3 9.0  --   MG  --   --  2.0   GFR: Estimated Creatinine Clearance: 30.2 mL/min (A) (by C-G formula based on SCr of 1.73 mg/dL (H)). Liver Function Tests: No results for input(s): AST, ALT, ALKPHOS, BILITOT, PROT, ALBUMIN in the last 168 hours. No results for input(s): LIPASE, AMYLASE in the last 168 hours. No results for input(s): AMMONIA in the last 168 hours. Coagulation Profile: Recent Labs  Lab 05/13/18 1651 05/14/18 0913  INR 2.45 2.34   Cardiac Enzymes: Recent Labs  Lab 05/13/18 2134 05/14/18 0357 05/14/18 0913  TROPONINI 0.03* 0.03* 0.03*   BNP (last 3 results) No results for input(s): PROBNP in the last 8760 hours. HbA1C: No results for input(s): HGBA1C in the last 72 hours. CBG: No results for input(s): GLUCAP in the last 168 hours. Lipid Profile: No results for input(s): CHOL, HDL, LDLCALC, TRIG, CHOLHDL, LDLDIRECT in the last 72 hours. Thyroid Function Tests: Recent Labs    05/13/18 2134  TSH 3.308   Anemia Panel: Recent Labs    05/14/18 0913  VITAMINB12 758   Urine analysis:    Component Value Date/Time   COLORURINE YELLOW 05/14/2018 [place] 05/14/2018 0251   LABSPEC 1.006 05/14/2018 0251   PHURINE 6.0 05/14/2018 0251   GLUCOSEU NEGATIVE 05/14/2018 0251   GLUCOSEU NEGATIVE 08/21/2013 1700   HGBUR NEGATIVE 05/14/2018 0251   BILIRUBINUR NEGATIVE 05/14/2018 0251   KETONESUR NEGATIVE 05/14/2018 0251   PROTEINUR NEGATIVE 05/14/2018 0251   UROBILINOGEN 0.2 08/21/2013 1700   NITRITE NEGATIVE 05/14/2018 0251   LEUKOCYTESUR NEGATIVE 05/14/2018 0251   Sepsis Labs: @LABRCNTIP (procalcitonin:4,lacticidven:4)  )No results found for  this or any previous visit (from the past 240 hour(s)).    Studies: Dg Chest 2 View  Result Date: 05/13/2018 CLINICAL DATA:  Shortness of Breath EXAM: CHEST - 2 VIEW COMPARISON:  05/09/2018 FINDINGS: Cardiac shadow is stable. Elevation of the left hemidiaphragm is again seen. Aortic calcifications are again noted. Bibasilar atelectasis are again identified slightly increased on the right when compared with the prior exam. No new focal infiltrate or effusion is seen. Degenerative changes of the thoracic spine are noted. IMPRESSION: Slight increase in the degree of right basilar atelectasis. Electronically Signed   By: Inez Catalina M.D.   On: 05/13/2018 16:16    Scheduled Meds: . allopurinol  100 mg Oral Daily  . amoxicillin-clavulanate  1 tablet Oral Q12H  . atorvastatin  20 mg Oral QHS  . brinzolamide  1 drop Both Eyes BID  . [START ON 05/15/2018] Calcifediol ER  30 mcg Oral QHS  . guaiFENesin  600 mg Oral BID  .  irbesartan  300 mg Oral Daily  . latanoprost  1 drop Both Eyes QHS  . metoprolol succinate  100 mg Oral Q1200  . pantoprazole  40 mg Oral Q supper  . pilocarpine  1 drop Left Eye BID  . potassium chloride  40 mEq Oral Q1200   And  . potassium chloride  20 mEq Oral QHS  . sodium chloride flush  3 mL Intravenous Q12H  . spironolactone  25 mg Oral Daily  . tamsulosin  0.4 mg Oral QHS  . verapamil  120 mg Oral QHS  . warfarin  2.5 mg Oral ONCE-1800  . Warfarin - Pharmacist Dosing Inpatient   Does not apply q1800    Continuous Infusions: . sodium chloride       LOS: 1 day     Kayleen Memos, MD [facility] Pager [phone]  If 7PM-7AM, please contact night-coverage [email or web address] Password [email or web address] 05/14/2018, 3:40 PM

## 2018-05-14 NOTE — Clinical Social Work Placement (Signed)
   CLINICAL SOCIAL WORK PLACEMENT  NOTE  Date:  05/14/2018  Patient Details  Name: [name] [name] MRN: [id number] Date of Birth: 01/23/1926  Clinical Social Work is seeking post-discharge placement for this patient at the [place] level of care (*CSW will initial, date and re-position this form in  chart as items are completed):  Yes   Patient/family provided with [facility] Work Department's list of facilities offering this level of care within the geographic area requested by the patient (or if unable, by the patient's family).  Yes   Patient/family informed of their freedom to choose among providers that offer the needed level of care, that participate in Medicare, Medicaid or managed care program needed by the patient, have an available bed and are willing to accept the patient.  Yes   Patient/family informed of [facility]'s ownership interest in [facility] and [facility], as well as of the fact that they are under no obligation to receive care at these facilities.  PASRR submitted to EDS on 05/14/18     PASRR number received on 05/14/18     Existing PASRR number confirmed on       FL2 transmitted to all facilities in geographic area requested by pt/family on 05/14/18     FL2 transmitted to all facilities within larger geographic area on       Patient informed that his/her managed care company has contracts with or will negotiate with certain facilities, including the following:            Patient/family informed of bed offers received.  Patient chooses bed at       Physician recommends and patient chooses bed at      Patient to be transferred to   on  .  Patient to be transferred to facility by       Patient family notified on   of transfer.  Name of family member notified:        PHYSICIAN Please sign FL2     Additional Comment:    _______________________________________________ Candie Chroman, LCSW 05/14/2018,  1:51 PM

## 2018-05-14 NOTE — Progress Notes (Signed)
Patient still SHOB throughout the day, PT placed patient on 5L during session D/T patient being SHOB and O2 dropping to the 80s. Patient now on 3.5L O2 via nasal cannula.

## 2018-05-14 NOTE — Clinical Social Work Note (Signed)
CSW called and provided patient's daughter with list of bed offers. She will review and call CSW with decision.  Dayton Scrape, [place]

## 2018-05-14 NOTE — Progress Notes (Signed)
ANTICOAGULATION CONSULT NOTE  Pharmacy Consult for warfarin Indication: atrial fibrillation  Vital Signs: Temp: 97.6 F (36.4 C) (10/08 0915) Temp Source: Oral (10/08 0915) BP: 114/83 (10/08 0915) Pulse Rate: 67 (10/08 0915)  Labs: Recent Labs    05/13/18 1534 05/13/18 1651 05/13/18 2134 05/14/18 0357 05/14/18 0913  HGB 12.9*  --   --   --  13.2  HCT 41.9  --   --   --  41.6  PLT 191  --   --   --  182  LABPROT  --  26.3*  --   --   --   INR  --  2.45  --   --   --   CREATININE 1.80*  --   --  1.73*  --   TROPONINI  --   --  0.03* 0.03*  --     Medical History: Past Medical History:  Diagnosis Date  . Aneurysm of pulmonary artery ([place])   . Aortic stenosis   . Atrial fibrillation ([place])   . BPH (benign prostatic hypertrophy)   . Congestive heart failure, unspecified   . CVA (cerebral infarction)   . DJD (degenerative joint disease)   . Generalized osteoarthrosis, unspecified site   . Gout, unspecified   . Pulmonary hypertension ([place])   . Pure hypercholesterolemia   . Renal insufficiency   . Unspecified cerebral artery occlusion with cerebral infarction   . Unspecified essential hypertension   . Unspecified hemorrhoids without mention of complication     Assessment: Pt is 84 year old male presenting for assessment and management of Afib and SOB. PTA warfarin dosing: 5 mg Monday and Friday; 2.5 mg on all other days. Current INR 2.3.    Goal of Therapy:  INR 2-3 Monitor platelets by anticoagulation protocol: Yes   Plan:  -warfarin 2.5 mg po x1 -daily INR   Kazumi Lachney, Jake Church 05/14/2018,9:46 AM

## 2018-05-14 NOTE — Clinical Social Work Note (Signed)
Clinical Social Work Assessment  Patient Details  Name: [name] [name] MRN: [id number] Date of Birth: 1926/05/23  Date of referral:  05/14/18               Reason for consult:  Facility Placement, Discharge Planning                Permission sought to share information with:  Facility Sport and exercise psychologist, Family Supports Permission granted to share information::  Yes, Verbal Permission Granted  Name::     [name] [name]  Agency::  SNF's  Relationship::  Daughter  Contact Information:  [phone]  Housing/Transportation Living arrangements for the past 2 months:  Single Family Home Source of Information:  Patient, Medical Team, Adult Children Patient Interpreter Needed:  None Criminal Activity/Legal Involvement Pertinent to Current Situation/Hospitalization:  No - Comment as needed Significant Relationships:  Adult Children Lives with:  Self Do you feel safe going back to the place where you live?  Yes Need for family participation in patient care:  Yes (Comment)  Care giving concerns:  PT recommending SNF once medically stable for discharge.   Social Worker assessment / plan:  CSW met with patient. Daughter at bedside. CSW introduced role and explained that PT recommendations would be discussed. Patient and his daughter agreeable to SNF placement. No facility preference. Provided SNF list for review. No further concerns. CSW encouraged patient and his daughter to contact CSW as needed. CSW will continue to follow patient and his daughter for support and facilitate discharge to SNF once medically stable.  Employment status:  Retired Nurse, adult PT Recommendations:  [place] / Referral to community resources:  [place]  Patient/Family's Response to care:  Patient and his daughter agreeable to SNF placement. Patient's daughter supportive and involved in patient's care. Patient and his daughter  appreciated social work intervention.  Patient/Family's Understanding of and Emotional Response to Diagnosis, Current Treatment, and Prognosis:  Patient and his daughter have a good understanding of the reason for admission and his need for rehab prior to returning home. Patient and his daughter appear happy with hospital care.  Emotional Assessment Appearance:  Appears stated age Attitude/Demeanor/Rapport:  Engaged, Gracious Affect (typically observed):  Accepting, Appropriate, Calm, Pleasant Orientation:  Oriented to Self, Oriented to Place, Oriented to  Time, Oriented to Situation Alcohol / Substance use:  Never Used Psych involvement (Current and /or in the community):  No (Comment)  Discharge Needs  Concerns to be addressed:  Care Coordination Readmission within the last 30 days:  No Current discharge risk:  Dependent with Mobility, Lives alone Barriers to Discharge:  Continued Medical Work up, [place], LCSW 05/14/2018, 1:46 PM

## 2018-05-14 NOTE — NC FL2 (Signed)
[place] LEVEL OF CARE SCREENING TOOL     IDENTIFICATION  Patient Name: [name] [name] Birthdate: June 24, 1926 Sex: male Admission Date (Current Location): 05/13/2018  [facility] and [place] Number:  Herbalist and Address:  [facility] [place] [address], [place], [place] [address]      Provider Number: [id number]  Attending Physician Name and Address:  Kayleen Memos, DO  Relative Name and Phone Number:       Current Level of Care: Hospital Recommended Level of Care: [place] Prior Approval Number:    Date Approved/Denied:   PASRR Number: [id number]  Discharge Plan: SNF    Current Diagnoses: Patient Active Problem List   Diagnosis Date Noted  . Acute on chronic respiratory failure with hypoxia ([place]) 05/14/2018  . CHF (congestive heart failure) ([place]) 05/13/2018  . Gait disorder 05/06/2018  . BPH (benign prostatic hyperplasia) 05/06/2018  . Hyperglycemia 10/02/2017  . CKD (chronic kidney disease) stage 3, GFR 30-59 ml/min (HCC) 10/02/2017  . Medicare annual wellness visit, subsequent 11/10/2016  . Routine adult health maintenance 11/10/2016  . Dyspnea 11/16/2015  . Lipoma of neck 06/15/2015  . Back pain 06/15/2015  . Lightheadedness 03/04/2015  . Glaucoma 01/14/2013  . Long term current use of anticoagulant 09/26/2010  . CHRONIC CORONARY ISCHEMIA 02/01/2010  . GYNECOMASTIA 11/15/2009  . DIZZINESS 07/13/2009  . Pulmonary hypertension ([place]) 09/25/2007  . GOUT 07/25/2007  . Aneurysm of pulmonary artery ([name]) 07/25/2007  . Aortic valve disorder 07/25/2007  . ATRIAL FIBRILLATION 07/25/2007  . HEMORRHOIDS 07/25/2007  . BENIGN PROSTATIC HYPERTROPHY, HX OF 07/25/2007  . INGUINAL HERNIA 07/24/2007  . HYPERCHOLESTEROLEMIA 06/01/2007  . Essential hypertension 06/01/2007  . Congestive heart failure ([place]) 06/01/2007  . CVA 06/01/2007  . Disorder resulting from impaired renal function 06/01/2007  .  Osteoarthritis 06/01/2007    Orientation RESPIRATION BLADDER Height & Weight     Self, Time, Situation, Place  O2(Nasal Canula 2 L) Continent, External catheter Weight: 174 lb (78.9 kg) Height:  5\' 11"  (180.3 cm)  BEHAVIORAL SYMPTOMS/MOOD NEUROLOGICAL BOWEL NUTRITION STATUS  (None) (None) Continent Diet(Heart healthy)  AMBULATORY STATUS COMMUNICATION OF NEEDS Skin   Extensive Assist Verbally                         Personal Care Assistance Level of Assistance              Functional Limitations Info  Sight, Hearing, Speech Sight Info: Impaired(Glaucoma) Hearing Info: Adequate Speech Info: Adequate    SPECIAL CARE FACTORS FREQUENCY  PT (By licensed PT), Blood pressure     PT Frequency: 5 x week              Contractures Contractures Info: Not present    Additional Factors Info  Code Status, Allergies Code Status Info: Full code Allergies Info: Ibuprofen           Current Medications (05/14/2018):  This is the current hospital active medication list Current Facility-Administered Medications  Medication Dose Route Frequency Provider Last Rate Last Dose  . 0.9 %  sodium chloride infusion  250 mL Intravenous PRN Fuller Plan A, MD      . acetaminophen (TYLENOL) tablet 650 mg  650 mg Oral Q4H PRN Fuller Plan A, MD   650 mg at 05/14/18 0544  . albuterol (PROVENTIL) (2.5 MG/3ML) 0.083% nebulizer solution 2.5 mg  2.5 mg Nebulization Q6H PRN Norval Morton, MD      .  allopurinol (ZYLOPRIM) tablet 100 mg  100 mg Oral Daily Smith, Rondell A, MD   100 mg at 05/14/18 0900  . amoxicillin-clavulanate (AUGMENTIN) 500-125 MG per tablet 500 mg  1 tablet Oral Q12H Smith, Rondell A, MD   500 mg at 05/14/18 0859  . atorvastatin (LIPITOR) tablet 20 mg  20 mg Oral QHS Smith, Rondell A, MD   20 mg at 05/13/18 2318  . brinzolamide (AZOPT) 1 % ophthalmic suspension 1 drop  1 drop Both Eyes BID Fuller Plan A, MD   1 drop at 05/14/18 0902  . [START ON 05/15/2018]  Calcifediol ER CPCR 30 mcg  30 mcg Oral QHS Hall, Carole N, DO      . guaiFENesin (MUCINEX) 12 hr tablet 600 mg  600 mg Oral BID Fuller Plan A, MD   600 mg at 05/14/18 0859  . irbesartan (AVAPRO) tablet 300 mg  300 mg Oral Daily Tamala Julian, Rondell A, MD   300 mg at 05/14/18 0901  . latanoprost (XALATAN) 0.005 % ophthalmic solution 1 drop  1 drop Both Eyes QHS Smith, Rondell A, MD   1 drop at 05/13/18 2319  . metoprolol succinate (TOPROL-XL) 24 hr tablet 100 mg  100 mg Oral Q1200 Smith, Rondell A, MD   100 mg at 05/14/18 1233  . ondansetron (ZOFRAN) injection 4 mg  4 mg Intravenous Q6H PRN Smith, Rondell A, MD      . pantoprazole (PROTONIX) EC tablet 40 mg  40 mg Oral Q supper Fuller Plan A, MD   40 mg at 05/13/18 2318  . pilocarpine (PILOCAR) 4 % ophthalmic solution 1 drop  1 drop Left Eye BID Fuller Plan A, MD   1 drop at 05/14/18 0902  . potassium chloride SA (K-DUR,KLOR-CON) CR tablet 40 mEq  40 mEq Oral Q1200 Smith, Rondell A, MD   40 mEq at 05/14/18 1233   And  . potassium chloride SA (K-DUR,KLOR-CON) CR tablet 20 mEq  20 mEq Oral QHS Smith, Rondell A, MD   20 mEq at 05/13/18 2318  . sodium chloride flush (NS) 0.9 % injection 3 mL  3 mL Intravenous Q12H Smith, Rondell A, MD   3 mL at 05/13/18 2320  . sodium chloride flush (NS) 0.9 % injection 3 mL  3 mL Intravenous PRN Smith, Rondell A, MD      . spironolactone (ALDACTONE) tablet 25 mg  25 mg Oral Daily Smith, Rondell A, MD   25 mg at 05/14/18 0900  . tamsulosin (FLOMAX) capsule 0.4 mg  0.4 mg Oral QHS Smith, Rondell A, MD   0.4 mg at 05/13/18 2318  . verapamil (CALAN-SR) CR tablet 120 mg  120 mg Oral QHS Smith, Rondell A, MD   120 mg at 05/13/18 2344  . warfarin (COUMADIN) tablet 2.5 mg  2.5 mg Oral ONCE-1800 Hall, Carole N, DO      . Warfarin - Pharmacist Dosing Inpatient   Does not apply q1800 Wynell Balloon [facility]         Discharge Medications: Please see discharge summary for a list of discharge medications.  Relevant Imaging  Results:  Relevant Lab Results:   Additional Information SS#: [id number]  Candie Chroman, LCSW

## 2018-05-14 NOTE — Consult Note (Addendum)
Cardiology Consultation:   Patient ID: JOLON DEGANTE MRN: [id number]; DOB: 07-26-1926  Admit date: 05/13/2018 Date of Consult: 05/14/2018  Primary Care Provider: Biagio Borg, MD Primary Cardiologist:  Dr. Percival Spanish  Primary Electrophysiologist:  None    Patient Profile:   MUSA REWERTS is a 84 y.o. male with a hx of chronic diastolic HF with EF 23% in 03/2017, severe aortic stenosis, chronic hypoxic respiratory failure on 2.5L O2 at home, permanent atrial fibrillation on chronic anticoagulation, pulmonary artery aneurysm, HTN, HLD, CKD Stage III who is being seen today for the evaluation of DOE, CHF, and atrial fibrillation at the request of Dr. Eustaquio Boyden A. Smith.  History of Present Illness:   Mr. Desroches presented to the ED yesterday with a 1-week history of worsening dyspnea both during exertion and at rest as well as orthopnea and PND. He states he has been gasping for air for the past 2 days and felt he was going to die. He has not been able to move much around the house, which he is usually able to do with a 4-wheel walker. He reports compliance with his home medication which his daughter helps him with. He also has a home aide that comes a few times a week to check his vitals. He was seen by his pulmonologist 6 days ago and prescribed Augmentin for possible CAP, but he denies improvement in symptoms despite compliance with antibiotic therapy. He was also seen in cards clinic yesterday at which time he was recommended to go to the ED given severe dyspnea, frailty and multiple medical comorbidities. There were no changes made to his medication regimen.   He also endorses malaise, fatigue, blurry vision, and recent fall. Denies fever, chills, night sweats, chest pain, or recent changes in medications other than addition of Augmentin for CAP.   He reports feeling much better today. States he can now breath.   Past Medical History:  Diagnosis Date  . Aneurysm of pulmonary artery  ([place])   . Aortic stenosis   . Atrial fibrillation ([place])   . BPH (benign prostatic hypertrophy)   . Congestive heart failure, unspecified   . CVA (cerebral infarction)   . DJD (degenerative joint disease)   . Generalized osteoarthrosis, unspecified site   . Gout, unspecified   . Pulmonary hypertension ([place])   . Pure hypercholesterolemia   . Renal insufficiency   . Unspecified cerebral artery occlusion with cerebral infarction   . Unspecified essential hypertension   . Unspecified hemorrhoids without mention of complication     Past Surgical History:  Procedure Laterality Date  . APPENDECTOMY    . CATARACT EXTRACTION    . cosmetic eye surgery    . INGUINAL HERNIA REPAIR       Home Medications:  Prior to Admission medications   Medication Sig Start Date End Date Taking? Authorizing Provider  allopurinol (ZYLOPRIM) 100 MG tablet TAKE 1 TABLET(100 MG) BY MOUTH DAILY. Patient taking differently: Take 100 mg by mouth daily.  02/26/18  Yes Biagio Borg, MD  amoxicillin-clavulanate (AUGMENTIN) 875-125 MG tablet Take 1 tablet by mouth 2 (two) times daily. 05/09/18  Yes Fenton Foy, NP  atorvastatin (LIPITOR) 20 MG tablet Take 1 tablet (20 mg total) by mouth daily. Patient taking differently: Take 20 mg by mouth at bedtime.  10/09/17  Yes Hochrein, Jeneen Rinks, MD  bimatoprost (LUMIGAN) 0.01 % SOLN Place 1 drop into both eyes at bedtime.   Yes [provider]  brinzolamide (AZOPT) 1 % ophthalmic  suspension Place 1 drop into both eyes 2 (two) times daily.     Yes [provider]  Calcifediol ER (RAYALDEE) 30 MCG CPCR Take 30 mcg by mouth at bedtime.    Yes [provider]  eplerenone (INSPRA) 25 MG tablet Take 1 tablet (25 mg total) by mouth daily. 10/29/17  Yes Minus Breeding, MD  metoprolol succinate (TOPROL-XL) 100 MG 24 hr tablet TAKE 1 TABLET BY MOUTH DAILY WITH OR IMMEDIATELY FOLLOWING A MEAL Patient taking differently: Take 100 mg by mouth daily at 12 noon.   09/04/17  Yes Minus Breeding, MD  Multiple Vitamin (MULTIVITAMIN) tablet Take 1 tablet by mouth daily.   Yes [provider]  omeprazole (PRILOSEC) 20 MG capsule TAKE 1 CAPSULE(20 MG) BY MOUTH DAILY Patient taking differently: Take 20 mg by mouth daily as needed (reflux).  05/10/18  Yes Fenton Foy, NP  pilocarpine (PILOCAR) 4 % ophthalmic solution Place 1 drop into the left eye 2 (two) times daily.    Yes [provider]  potassium chloride SA (K-DUR,KLOR-CON) 20 MEQ tablet TAKE 3 TABLETS BY MOUTH DAILY Patient taking differently: Take 20-40 mEq by mouth See admin instructions. Take 2 tablets at noon, and 1 tablet at bedtime 10/01/17  Yes Minus Breeding, MD  tamsulosin (FLOMAX) 0.4 MG CAPS capsule Take 1 capsule (0.4 mg total) by mouth 2 (two) times daily. Patient taking differently: Take 0.4 mg by mouth at bedtime.  05/06/18  Yes Biagio Borg, MD  telmisartan (MICARDIS) 80 MG tablet Take 1 tablet (80 mg total) by mouth daily. Patient taking differently: Take 80 mg by mouth daily at 12 noon.  10/01/17  Yes Minus Breeding, MD  torsemide (DEMADEX) 20 MG tablet TAKE 2 TABLETS BY MOUTH TWICE DAILY EVERY Patient taking differently: Take 40 mg by mouth 2 (two) times daily.  04/29/18  Yes Sherren Mocha, MD  verapamil (CALAN-SR) 120 MG CR tablet TAKE 1 TABLET(120 MG) BY MOUTH AT BEDTIME Patient taking differently: Take 120 mg by mouth at bedtime.  04/29/18  Yes Minus Breeding, MD  warfarin (COUMADIN) 5 MG tablet TAKE 1/2 TO 1 TABLET BY MOUTH DAILY AS DIRECTED BY COUMADIN CLINIC Patient taking differently: Take 2.5-5 mg by mouth See admin instructions. Take 1 tablet (5mg ) on Mon and Fri. Take 1/2 tablet (2.5mg ) all other days at noon 04/30/18  Yes Minus Breeding, MD    Inpatient Medications: Scheduled Meds: . allopurinol  100 mg Oral Daily  . amoxicillin-clavulanate  1 tablet Oral Q12H  . atorvastatin  20 mg Oral QHS  . brinzolamide  1 drop Both Eyes BID  . calcitRIOL  0.25  mcg Oral Daily  . guaiFENesin  600 mg Oral BID  . ipratropium-albuterol      . irbesartan  300 mg Oral Daily  . latanoprost  1 drop Both Eyes QHS  . metoprolol succinate  100 mg Oral Q1200  . pantoprazole  40 mg Oral Q supper  . pilocarpine  1 drop Left Eye BID  . potassium chloride  40 mEq Oral Q1200   And  . potassium chloride  20 mEq Oral QHS  . sodium chloride flush  3 mL Intravenous Q12H  . spironolactone  25 mg Oral Daily  . tamsulosin  0.4 mg Oral QHS  . verapamil  120 mg Oral QHS  . Warfarin - Pharmacist Dosing Inpatient   Does not apply q1800   Continuous Infusions: . sodium chloride     PRN Meds: sodium chloride, acetaminophen, albuterol, ondansetron (  ZOFRAN) IV, sodium chloride flush  Allergies:    Allergies  Allergen Reactions  . Ibuprofen     REACTION: causes low blood pressure    Social History:   Social History   Socioeconomic History  . Marital status: Married    Spouse name: Not on file  . Number of children: 5  . Years of education: 10  . Highest education level: Not on file  Occupational History  . Occupation: Retired  Scientific laboratory technician  . Financial resource strain: Not on file  . Food insecurity:    Worry: Not on file    Inability: Not on file  . Transportation needs:    Medical: Not on file    Non-medical: Not on file  Tobacco Use  . Smoking status: Former Smoker    Years: 3.00    Types: Cigarettes, Cigars    Last attempt to quit: 12/05/2003    Years since quitting: 14.4  . Smokeless tobacco: Never Used  . Tobacco comment: does not smoke cigars anymore  Substance and Sexual Activity  . Alcohol use: Not on file    Comment: Very rarely  . Drug use: No  . Sexual activity: Not on file  Lifestyle  . Physical activity:    Days per week: Not on file    Minutes per session: Not on file  . Stress: Not on file  Relationships  . Social connections:    Talks on phone: Not on file    Gets together: Not on file    Attends religious service: Not  on file    Active member of club or organization: Not on file    Attends meetings of clubs or organizations: Not on file    Relationship status: Not on file  . Intimate partner violence:    Fear of current or ex partner: Not on file    Emotionally abused: Not on file    Physically abused: Not on file    Forced sexual activity: Not on file  Other Topics Concern  . Not on file  Social History Narrative   Fun: Stay home; sitting on the porch   Denies religious beliefs effecting health care    Family History:   HTN- mother     ROS:  Please see the history of present illness.  All other ROS reviewed and negative.     Physical Exam/Data:   Vitals:   05/13/18 2123 05/14/18 0006 05/14/18 0044 05/14/18 0347  BP:  (!) 143/95  119/80  Pulse:  (!) 113 87 98  Resp:  16  18  Temp:  97.8 F (36.6 C)  97.9 F (36.6 C)  TempSrc:    Oral  SpO2: 90% 94%  100%  Weight:    78.9 kg  Height:        Intake/Output Summary (Last 24 hours) at 05/14/2018 0842 Last data filed at 05/14/2018 0548 Gross per 24 hour  Intake 360 ml  Output 1200 ml  Net -840 ml   Filed Weights   05/13/18 2122 05/14/18 0347  Weight: 79.2 kg 78.9 kg   Body mass index is 24.27 kg/m.  General:  Chronically ill-appearing thin male, unable to speak in full sentences  HEENT: normal Lymph: no adenopathy Neck: no JVD Endocrine:  No thryomegaly Vascular: No carotid bruits  Cardiac:  IRIR, III/VI AS murmur best heard at RUSB  Lungs:  Absent breath sounds at the bases, decreased breath sounds over middle lung filed bilaterally, and crackles heard over upper lung fields  Abd: soft, nontender, no hepatomegaly  Ext: no edema Musculoskeletal:  No deformities, BUE and BLE strength normal and equal Skin: warm and dry  Neuro:  CNs 2-12 intact, no focal abnormalities noted Psych:  Normal affect   EKG:  The EKG was personally reviewed and demonstrates:  afib with PVCs and TWI in I and aVL that are not new compared to prior  on 9/202018  Telemetry:  Telemetry was personally reviewed and demonstrates:  afib with PVCs   Relevant CV Studies: CXR 10/7:FINDINGS: Cardiac shadow is stable. Elevation of the left hemidiaphragm is again seen. Aortic calcifications are again noted. Bibasilar atelectasis are again identified slightly increased on the right when compared with the prior exam. No new focal infiltrate or effusion is seen. Degenerative changes of the thoracic spine are noted. IMPRESSION: Slight increase in the degree of right basilar atelectasis.  Echocardiogram  8/21: Study Conclusions - Procedure narrative: Transthoracic echocardiography. Image   quality was adequate. Intravenous contrast (Definity) was   administered. - Left ventricle: The cavity size was normal. Wall thickness was   increased in a pattern of moderate LVH. Systolic function was   hyperdynamic. The estimated ejection fraction was in the range of   75% to 80%. Wall motion was normal; there were no regional wall   motion abnormalities. - Aortic valve: Moderately to severely calcified annulus. Severely   thickened, severely calcified leaflets. There was moderate   stenosis. Mean gradient (S): 28 mm Hg. Peak gradient (S): 47 mm   Hg. - Mitral valve: Mildly calcified annulus. - Left atrium: The atrium was moderately dilated. - Right ventricle: The cavity size was moderately dilated. Systolic   function was mildly to moderately reduced. - Right atrium: The atrium was severely dilated. - Pulmonary arteries: Systolic pressure was moderately increased.   PA peak pressure: 54 mm Hg (S).   Laboratory Data:  Chemistry Recent Labs  Lab 05/13/18 1534 05/14/18 0357  NA 139 141  K 4.0 3.7  CL 105 105  CO2 22 26  GLUCOSE 127* 116*  BUN 27* 25*  CREATININE 1.80* 1.73*  CALCIUM 9.3 9.0  GFRNONAA 31* 33*  GFRAA 36* 38*  ANIONGAP 12 10    No results for input(s): PROT, ALBUMIN, AST, ALT, ALKPHOS, BILITOT in the last 168  hours. Hematology Recent Labs  Lab 05/13/18 1534  WBC 8.0  RBC 4.06*  HGB 12.9*  HCT 41.9  MCV 103.2*  MCH 31.8  MCHC 30.8  RDW 16.8*  PLT 191   Cardiac Enzymes Recent Labs  Lab 05/13/18 2134 05/14/18 0357  TROPONINI 0.03* 0.03*    Recent Labs  Lab 05/13/18 1541  TROPIPOC 0.03    BNP Recent Labs  Lab 05/13/18 1651  BNP 974.3*    DDimer No results for input(s): DDIMER in the last 168 hours.  Radiology/Studies:  Dg Chest 2 View  Result Date: 05/13/2018 CLINICAL DATA:  Shortness of Breath EXAM: CHEST - 2 VIEW COMPARISON:  05/09/2018 FINDINGS: Cardiac shadow is stable. Elevation of the left hemidiaphragm is again seen. Aortic calcifications are again noted. Bibasilar atelectasis are again identified slightly increased on the right when compared with the prior exam. No new focal infiltrate or effusion is seen. Degenerative changes of the thoracic spine are noted. IMPRESSION: Slight increase in the degree of right basilar atelectasis. Electronically Signed   By: Inez Catalina M.D.   On: 05/13/2018 16:16    Assessment and Plan:   1. Acute on chronic diastolic heart failure: Presenting with worsening  DOE and orthopnea. He appears euvolemic on exam after receiving a total of IV Lasix 20 + 40 mg x1 yesterday. Unclear dry weight, 170 lbs on 01/15/2018. Now 4 lbs up since then. BNP 967. Ischemic workup thus far negative with troponin flat at 0.03 and EKG showing afib with PVCs and TWI in I and aVL that are not new compared to prior on 9/202018. Home regimen includes: Tropol XL 100 mg QD, eplerenone 25 mg QD, telmisartan 80 mg QD, torsemide 40 BID, and verapamil 1120 mg QD.   - Continue current home regimen for BB, CCB, and ARB  - IV Lasix 60 mg x1 today   - Switch to PO torsemide tomorrow: 80 in AM and continue 40 mg PM  - Will follow up echocardiogram   - Follow up in clinic in 2 weeks   2. Permanent atrial fibrillation: CHA2DS2-VASc score of 6. Has not been seen in Afib clinic.  Currently on metoprolol 100 mg QD and verapamil 120 mg QD. He is on warfarin for anticoagulation. INR 2.45.    3. Moderate aortic stenosis: Treating medically as he is not a candidate for TAVR due to advanced age and multiple comorbidities. Suspect aortic stenosis has now worsened. Will follow up echocardiogram to assess for worsening aortic stenosis. Recommend palliative care consult for [facility] discussion given poor prognosis.   4. Chronic hypoxic respiratory failure: Multifactorial in the setting of HF, pulmonary HTN, and severe aortic stenosis. O2-dependent at home. Currently oxygenating well on his home O2 regimen.   5. Pulmonary hypertension: Continue medical management and oxygen therapy.   6. Essential hypertension: Initially hypertensive on admission. Now appears well controlled on current regimen: BB, CCB, ARB, and diuretic.   7. Hyperlipidemia: Continue Lipitor 20 mg QD  8. CKD Stage III: Follows up with Dr. Cathlean Cower. Renal function appears at baseline at this time.      For questions or updates, please contact [place] Please consult [email or web address] for contact info under    Signed, Welford Roche, MD  05/14/2018 8:42 AM  I have seen and examined the patient along with Welford Roche, MD.  I have reviewed the chart, notes and new data.  I agree with her note.  Key new complaints: He reports being markedly improved since yesterday, but still had a "spell" of shortness of breath at rest today.  Denies angina.  Not experienced syncope. Key examination changes: Irregular rhythm with varying intensity of the aortic stenosis murmur.  The murmur appears to be late peaking.  The carotid pulses are delayed and weak.  No overt physical findings to suggest hypervolemia Key new findings / data: Follow-up echo pending.  Report of echo from 2018 and Dr. Antionette Char note from September 2018 reviewed.  Chest x-ray reviewed.  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  PLAN: We will wait for the echo findings, but I suspect Mr. Jeanbaptiste has advanced/terminal aortic stenosis. Evaluation for aortic valve replacement last year found him to be on an inappropriate candidate for either surgical or transarterial valve replacement. Had a preliminary discussion with the patient and his daughter regarding the need to involve palliative care in the absence of any radical treatments that would improve his outcome.  We will increase his diuretics, but suspect that this will soon be limited by episodes of hypotension and excessive diuresis could lead to syncope.  I think we should switch his CODE STATUS  to DO NOT RESUSCITATE and have initiated that discussion as well, but I have not yet obtained a full commitment from the patient and his family on that matter. His daughter [name] raises concern about his current living conditions and I agree that he is not appropriate for independent living without 24-hour care. Inpatient hospice is a consideration.  Sanda Klein, MD, [place] [phone] 05/14/2018, 10:53 AM

## 2018-05-15 DIAGNOSIS — E7800 Pure hypercholesterolemia, unspecified: Secondary | ICD-10-CM

## 2018-05-15 DIAGNOSIS — I5020 Unspecified systolic (congestive) heart failure: Secondary | ICD-10-CM

## 2018-05-15 DIAGNOSIS — I4819 Other persistent atrial fibrillation: Secondary | ICD-10-CM

## 2018-05-15 DIAGNOSIS — I5033 Acute on chronic diastolic (congestive) heart failure: Secondary | ICD-10-CM

## 2018-05-15 DIAGNOSIS — Z515 Encounter for palliative care: Secondary | ICD-10-CM

## 2018-05-15 LAB — FOLATE RBC
Folate, Hemolysate: >620 ng/mL
HEMATOCRIT: 35.7 % — AB (ref 37.5–51.0)

## 2018-05-15 LAB — BASIC METABOLIC PANEL
Anion gap: 8 (ref 5–15)
BUN: 29 mg/dL — ABNORMAL HIGH (ref 8–23)
CO2: 28 mmol/L (ref 22–32)
Calcium: 8.8 mg/dL — ABNORMAL LOW (ref 8.9–10.3)
Chloride: 104 mmol/L (ref 98–111)
Creatinine, Ser: 1.88 mg/dL — ABNORMAL HIGH (ref 0.61–1.24)
GFR calc Af Amer: 35 mL/min — ABNORMAL LOW (ref >60)
GFR, EST NON AFRICAN AMERICAN: 30 mL/min — AB (ref >60)
GLUCOSE: 111 mg/dL — AB (ref 70–99)
POTASSIUM: 4.7 mmol/L (ref 3.5–5.1)
Sodium: 140 mmol/L (ref 135–145)

## 2018-05-15 LAB — CBC
HEMATOCRIT: 38.4 % — AB (ref 39.0–52.0)
HEMOGLOBIN: 12.2 g/dL — AB (ref 13.0–17.0)
MCH: 32.3 pg (ref 26.0–34.0)
MCHC: 31.8 g/dL (ref 30.0–36.0)
MCV: 101.6 fL — ABNORMAL HIGH (ref 80.0–100.0)
NRBC: 0 % (ref 0.0–0.2)
Platelets: 184 10*3/uL (ref 150–400)
RBC: 3.78 MIL/uL — ABNORMAL LOW (ref 4.22–5.81)
RDW: 16.7 % — AB (ref 11.5–15.5)
WBC: 7.2 10*3/uL (ref 4.0–10.5)

## 2018-05-15 LAB — PROTIME-INR
INR: 3.2
Prothrombin Time: 32.5 seconds — ABNORMAL HIGH (ref 11.4–15.2)

## 2018-05-15 MED ORDER — TORSEMIDE 20 MG PO TABS
40.0000 mg | ORAL_TABLET | Freq: Every day | ORAL | Status: DC
  Administered 2018-05-15: 40 mg via ORAL
  Filled 2018-05-15: qty 2

## 2018-05-15 MED ORDER — TORSEMIDE 20 MG PO TABS
80.0000 mg | ORAL_TABLET | Freq: Every day | ORAL | Status: DC
  Administered 2018-05-16: 80 mg via ORAL
  Filled 2018-05-15: qty 4

## 2018-05-15 MED ORDER — WARFARIN SODIUM 1 MG PO TABS
1.0000 mg | ORAL_TABLET | Freq: Once | ORAL | Status: AC
  Administered 2018-05-15: 1 mg via ORAL
  Filled 2018-05-15: qty 1

## 2018-05-15 NOTE — Progress Notes (Signed)
PROGRESS NOTE    [name] [name]  SWN:[id number] DOB: 02/11/1926 DOA: 05/13/2018 PCP: Biagio Borg, MD      Brief Narrative:  [name] [name] is a 84 y.o. M with dCHF, aortic stenosis and pHTN with pulmonary artery aneurysm, chronic respiratory failrue on O2, Afib on warfarin, and CKD III baseline Cr. 1.7 who presents with several weeks progressive dyspnea on exertion, weakness.  Found to have pedal edema, started on IV Lasix.     Assessment & Plan:  Acute on chronic diastolic CHF Acute right sided CHF Largely from progression of aortic stenosis. Cardiology reviewed echo today, and suspect that his dimensionless obstructive index indicates severe functional AS.  Palliative care are involved, and his prognosis is likely less than 6 months. Torsemide recommended by Cardiology yesterday, not started. -Torsemide 80 in AM and 40 in PM -Continue spironolactone -Continue metoprolol -Continue irbesartan  Aortic stenosis Not TAVR candidate -Consult Cardiology, appreciate expert recommendations -Consult Palliative Care, appreciate expert cares   Community acquired pneumonia Augmentin started for new infiltrates by Pulm right before admission. -Augmentin, finish 7 days tomorrow  Atrial fibrillation, chronic CHA2DS2-Vasc 4.  On warfarin. -Continue warfarin -Continue metoprolol and verapamil  Chronic kidney disease, stage III Cr slight increase today.  Hypertension -Continue irbesartan, metoprolol, verapamil, spironolactone  Other medications -Continue eye drops -Continue allopurinol -Continue Flomax      DVT prophylaxis: N/A on warfarin Code Status: FULL Family Communication: Daughter at bedside MDM and disposition Plan: The below labs and imaging reports were reviewed and summarized above.  Medication management as above.  The patient was admitted with severe congestive heart failure from end stage aortic stenosis.  We will monitor renal function as we arrange for a  safe discharge plan.   Consultants:   Cardiology  Procedures:   Echocardiogram  Antimicrobials:   Augmentin    Subjective: No chest pain, no new dyspnea.  No confusion.  No focal weakness.    Objective: Vitals:   05/14/18 1659 05/14/18 1921 05/15/18 0519 05/15/18 1213  BP: 108/72 (!) 148/98 109/79 116/82  Pulse: 71 78 80 78  Resp: 18 16 18 18   Temp: 98.3 F (36.8 C) 98.2 F (36.8 C) 97.7 F (36.5 C) 98.5 F (36.9 C)  TempSrc: Oral Oral Oral Oral  SpO2: 99% 98% 96%   Weight:   78.4 kg   Height:        Intake/Output Summary (Last 24 hours) at 05/15/2018 1512 Last data filed at 05/15/2018 1130 Gross per 24 hour  Intake 480 ml  Output 900 ml  Net -420 ml   Filed Weights   05/13/18 2122 05/14/18 0347 05/15/18 0519  Weight: 79.2 kg 78.9 kg 78.4 kg    Examination: General appearance: thin elderly adult male, alert and in no acute distress.   HEENT: Anicteric, conjunctiva pink, lids and lashes normal. No nasal deformity, discharge, epistaxis.  Lips moist, dentures in place, no oral lesions, hearnig poor.   Skin: Warm and dry.   No suspicious rashes or lesions. Cardiac: RRR, nl S1-S2, no murmurs appreciated.  Capillary refill is brisk.  JVP normal.  No LE edema.  Radia  pulses 2+ and symmetric. Respiratory: Normal respiratory rate and rhythm.  CTAB without rales or wheezes. Abdomen: Abdomen soft.  no TTP. No ascites, distension, hepatosplenomegaly.   MSK: No deformities or effusions. Neuro: Awake and alert.  EOMI, moves all extremities. Speech fluent.    Psych: Sensorium intact and responding to questions, attention normal. Affect normal.  Judgment and insight  appear normal.    Data Reviewed: I have personally reviewed following labs and imaging studies:  CBC: Recent Labs  Lab 05/13/18 1534 05/14/18 0913 05/15/18 0351  WBC 8.0 8.3 7.2  NEUTROABS  --  6.1  --   HGB 12.9* 13.2 12.2*  HCT 41.9 41.6 38.4*  MCV 103.2* 101.7* 101.6*  PLT 191 182 299   Basic  Metabolic Panel: Recent Labs  Lab 05/13/18 1534 05/14/18 0357 05/14/18 0913 05/15/18 0351  NA 139 141  --  140  K 4.0 3.7  --  4.7  CL 105 105  --  104  CO2 22 26  --  28  GLUCOSE 127* 116*  --  111*  BUN 27* 25*  --  29*  CREATININE 1.80* 1.73*  --  1.88*  CALCIUM 9.3 9.0  --  8.8*  MG  --   --  2.0  --    GFR: Estimated Creatinine Clearance: 27.8 mL/min (A) (by C-G formula based on SCr of 1.88 mg/dL (H)). Liver Function Tests: No results for input(s): AST, ALT, ALKPHOS, BILITOT, PROT, ALBUMIN in the last 168 hours. No results for input(s): LIPASE, AMYLASE in the last 168 hours. No results for input(s): AMMONIA in the last 168 hours. Coagulation Profile: Recent Labs  Lab 05/13/18 1651 05/14/18 0913 05/15/18 0351  INR 2.45 2.34 3.20   Cardiac Enzymes: Recent Labs  Lab 05/13/18 2134 05/14/18 0357 05/14/18 0913  TROPONINI 0.03* 0.03* 0.03*   BNP (last 3 results) No results for input(s): PROBNP in the last 8760 hours. HbA1C: No results for input(s): HGBA1C in the last 72 hours. CBG: No results for input(s): GLUCAP in the last 168 hours. Lipid Profile: No results for input(s): CHOL, HDL, LDLCALC, TRIG, CHOLHDL, LDLDIRECT in the last 72 hours. Thyroid Function Tests: Recent Labs    05/13/18 2134  TSH 3.308   Anemia Panel: Recent Labs    05/14/18 0913  VITAMINB12 758   Urine analysis:    Component Value Date/Time   COLORURINE YELLOW 05/14/2018 [place] 05/14/2018 0251   LABSPEC 1.006 05/14/2018 0251   PHURINE 6.0 05/14/2018 0251   GLUCOSEU NEGATIVE 05/14/2018 0251   GLUCOSEU NEGATIVE 08/21/2013 1700   HGBUR NEGATIVE 05/14/2018 0251   BILIRUBINUR NEGATIVE 05/14/2018 0251   KETONESUR NEGATIVE 05/14/2018 0251   PROTEINUR NEGATIVE 05/14/2018 0251   UROBILINOGEN 0.2 08/21/2013 1700   NITRITE NEGATIVE 05/14/2018 0251   LEUKOCYTESUR NEGATIVE 05/14/2018 0251   Sepsis Labs: @LABRCNTIP (procalcitonin:4,lacticacidven:4)  )No results found  for this or any previous visit (from the past 240 hour(s)).       Radiology Studies: Dg Chest 2 View  Result Date: 05/13/2018 CLINICAL DATA:  Shortness of Breath EXAM: CHEST - 2 VIEW COMPARISON:  05/09/2018 FINDINGS: Cardiac shadow is stable. Elevation of the left hemidiaphragm is again seen. Aortic calcifications are again noted. Bibasilar atelectasis are again identified slightly increased on the right when compared with the prior exam. No new focal infiltrate or effusion is seen. Degenerative changes of the thoracic spine are noted. IMPRESSION: Slight increase in the degree of right basilar atelectasis. Electronically Signed   By: Inez Catalina M.D.   On: 05/13/2018 16:16        Scheduled Meds: . allopurinol  100 mg Oral Daily  . amoxicillin-clavulanate  1 tablet Oral Q12H  . atorvastatin  20 mg Oral QHS  . brinzolamide  1 drop Both Eyes BID  . Calcifediol ER  30 mcg Oral QHS  . guaiFENesin  600  mg Oral BID  . irbesartan  300 mg Oral Daily  . latanoprost  1 drop Both Eyes QHS  . mouth rinse  15 mL Mouth Rinse BID  . metoprolol succinate  100 mg Oral Q1200  . pantoprazole  40 mg Oral Q supper  . pilocarpine  1 drop Left Eye BID  . potassium chloride  40 mEq Oral Q1200   And  . potassium chloride  20 mEq Oral QHS  . sodium chloride flush  3 mL Intravenous Q12H  . spironolactone  25 mg Oral Daily  . tamsulosin  0.4 mg Oral QHS  . verapamil  120 mg Oral QHS  . warfarin  1 mg Oral ONCE-1800  . Warfarin - Pharmacist Dosing Inpatient   Does not apply q1800   Continuous Infusions: . sodium chloride       LOS: 2 days    Time spent: 25 minutes    Edwin Dada, MD [facility] 05/15/2018, 3:12 PM     Pager [phone] --- please page though AMION:  [email or web address] Password [email or web address] If 7PM-7AM, please contact night-coverage

## 2018-05-15 NOTE — Discharge Instructions (Signed)

## 2018-05-15 NOTE — Progress Notes (Signed)
Progress Note  Patient Name: [name] [name] Date of Encounter: 05/15/2018  Primary Cardiologist: Hochrein  Subjective   Feeling better than yesterday but still orthopneic.  Roughly 1.3 L net diuresis. Echo interpreted as showing moderate aortic stenosis (mean transvalvular gradient 25 mmHg), but his dimensionless obstructive index is 0.25, consistent with severe aortic stenosis.  Inpatient Medications    Scheduled Meds: . allopurinol  100 mg Oral Daily  . amoxicillin-clavulanate  1 tablet Oral Q12H  . atorvastatin  20 mg Oral QHS  . brinzolamide  1 drop Both Eyes BID  . Calcifediol ER  30 mcg Oral QHS  . guaiFENesin  600 mg Oral BID  . irbesartan  300 mg Oral Daily  . latanoprost  1 drop Both Eyes QHS  . mouth rinse  15 mL Mouth Rinse BID  . metoprolol succinate  100 mg Oral Q1200  . pantoprazole  40 mg Oral Q supper  . pilocarpine  1 drop Left Eye BID  . potassium chloride  40 mEq Oral Q1200   And  . potassium chloride  20 mEq Oral QHS  . sodium chloride flush  3 mL Intravenous Q12H  . spironolactone  25 mg Oral Daily  . tamsulosin  0.4 mg Oral QHS  . verapamil  120 mg Oral QHS  . Warfarin - Pharmacist Dosing Inpatient   Does not apply q1800   Continuous Infusions: . sodium chloride     PRN Meds: sodium chloride, acetaminophen, albuterol, ondansetron (ZOFRAN) IV, sodium chloride flush   Vital Signs    Vitals:   05/14/18 1336 05/14/18 1659 05/14/18 1921 05/15/18 0519  BP: 112/85 108/72 (!) 148/98 109/79  Pulse: (!) 123 71 78 80  Resp:  18 16 18   Temp:  98.3 F (36.8 C) 98.2 F (36.8 C) 97.7 F (36.5 C)  TempSrc:  Oral Oral Oral  SpO2:  99% 98% 96%  Weight:    78.4 kg  Height:        Intake/Output Summary (Last 24 hours) at 05/15/2018 1052 Last data filed at 05/15/2018 0900 Gross per 24 hour  Intake 600 ml  Output 1400 ml  Net -800 ml   Filed Weights   05/13/18 2122 05/14/18 0347 05/15/18 0519  Weight: 79.2 kg 78.9 kg 78.4 kg    Telemetry    AF rate controlled - Personally Reviewed  ECG    AFib, rate controlled, LAFB - Personally Reviewed  Physical Exam  Appears more comfortable, but still orthopneic GEN: No acute distress.   Neck: 8 cm JVD Cardiac: RRR, late peaking AS murmur, no diastolic murmurs, rubs, or gallops.  Respiratory: Clear to auscultation bilaterally. GI: Soft, nontender, non-distended  MS: No edema; No deformity. Neuro:  Nonfocal  Psych: Normal affect   Labs    Chemistry Recent Labs  Lab 05/13/18 1534 05/14/18 0357 05/15/18 0351  NA 139 141 140  K 4.0 3.7 4.7  CL 105 105 104  CO2 22 26 28   GLUCOSE 127* 116* 111*  BUN 27* 25* 29*  CREATININE 1.80* 1.73* 1.88*  CALCIUM 9.3 9.0 8.8*  GFRNONAA 31* 33* 30*  GFRAA 36* 38* 35*  ANIONGAP 12 10 8      Hematology Recent Labs  Lab 05/13/18 1534 05/14/18 0913 05/15/18 0351  WBC 8.0 8.3 7.2  RBC 4.06* 4.09* 3.78*  HGB 12.9* 13.2 12.2*  HCT 41.9 41.6 38.4*  MCV 103.2* 101.7* 101.6*  MCH 31.8 32.3 32.3  MCHC 30.8 31.7 31.8  RDW 16.8* 16.7* 16.7*  PLT  191 182 184    Cardiac Enzymes Recent Labs  Lab 05/13/18 2134 05/14/18 0357 05/14/18 0913  TROPONINI 0.03* 0.03* 0.03*    Recent Labs  Lab 05/13/18 1541  TROPIPOC 0.03     BNP Recent Labs  Lab 05/13/18 1651  BNP 974.3*     DDimer No results for input(s): DDIMER in the last 168 hours.   Radiology    Dg Chest 2 View  Result Date: 05/13/2018 CLINICAL DATA:  Shortness of Breath EXAM: CHEST - 2 VIEW COMPARISON:  05/09/2018 FINDINGS: Cardiac shadow is stable. Elevation of the left hemidiaphragm is again seen. Aortic calcifications are again noted. Bibasilar atelectasis are again identified slightly increased on the right when compared with the prior exam. No new focal infiltrate or effusion is seen. Degenerative changes of the thoracic spine are noted. IMPRESSION: Slight increase in the degree of right basilar atelectasis. Electronically Signed   By: Inez Catalina M.D.   On:  05/13/2018 16:16    Cardiac Studies  Echo 05/14/2018 - Left ventricle: The cavity size was normal. Wall thickness was   increased in a pattern of mild LVH. Systolic function was normal.   The estimated ejection fraction was in the range of 55% to 60%.   Wall motion was normal; there were no regional wall motion   abnormalities. - Aortic valve: Valve mobility was restricted. There was moderate   stenosis. There was trivial regurgitation. Valve area (VTI): 1.16   cm^2. Valve area (Vmax): 1.03 cm^2. Valve area (Vmean): 1.07   cm^2. - Mitral valve: Calcified annulus. There was mild regurgitation. - Left atrium: The atrium was severely dilated. - Right atrium: The atrium was severely dilated. - Tricuspid valve: There was moderate regurgitation. - Pulmonary arteries: Systolic pressure was severely increased. PA   peak pressure: 67 mm Hg (S).  Impressions:  - Normal LV systolic function; mild LVH; calcified aortic valve   with moderate AS (mean gradient 27 mmHg) and trace AI; mild MR;   severe biatrial enlargement; moderate TR; severe pulmonary   hypertension.  Patient Profile     84 y.o. 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 Acute on chronic diastolic heart failure: Improving with diuretics.  Slight uptake in BUN and creatinine, but just switched to new oral diuretic regimen yesterday.  Continue to monitor on current dose.   2. Permanent atrial fibrillation: CHA2DS2-VASc score of 6. Rate controlled. Mildly supratherapeutic INR 3.2.              3. Severe aortic stenosis:  not a candidate for TAVR due to advanced age and multiple comorbidities. Discussed natural history of AS and poor prognosis.     4. Chronic hypoxic respiratory failure: Multifactorial in the setting of HF, pulmonary HTN, and severe aortic stenosis. O2-dependent at home. Currently oxygenating well on his home O2 regimen.   5. Pulmonary hypertension: Severe. On O2.   6. Essential hypertension: controlled.  7. Hyperlipidemia: on statin  8. CKD Stage III: Renal function appears at baseline at this time. Minimal uptick today.  9.    End of life discussion:  Palliative care involved. Prognosis is poor. I recommended DNR status. He is still uncertain. Asked him to have a very detailed discussion with his family about what degree  of functional decline he would consider to be unacceptable QOL and would change his approach to DNR issues.     For questions or updates, please contact [place] Please consult [email or web address] for contact info under        Signed, Sanda Klein, MD  05/15/2018, 10:52 AM

## 2018-05-15 NOTE — Progress Notes (Signed)
Notified oncall NP to inform of Central Monitoring alert that patient had several pauses causing his heart rate to drop to 35 nonsustained. The longest pause was 2.47sec.  Awaiting response.

## 2018-05-15 NOTE — Progress Notes (Signed)
[facility] for warfarin Indication: atrial fibrillation  Vital Signs: Temp: 97.7 F (36.5 C) (10/09 0519) Temp Source: Oral (10/09 0519) BP: 109/79 (10/09 0519) Pulse Rate: 80 (10/09 0519)  Labs: Recent Labs    05/13/18 1534 05/13/18 1651 05/13/18 2134 05/14/18 0357 05/14/18 0913 05/15/18 0351  HGB 12.9*  --   --   --  13.2 12.2*  HCT 41.9  --   --   --  41.6 38.4*  PLT 191  --   --   --  182 184  LABPROT  --  26.3*  --   --  25.4* 32.5*  INR  --  2.45  --   --  2.34 3.20  CREATININE 1.80*  --   --  1.73*  --  1.88*  TROPONINI  --   --  0.03* 0.03* 0.03*  --     Medical History: Past Medical History:  Diagnosis Date  . Aneurysm of pulmonary artery ([place])   . Aortic stenosis   . Atrial fibrillation ([place])   . BPH (benign prostatic hypertrophy)   . Congestive heart failure, unspecified   . CVA (cerebral infarction)   . DJD (degenerative joint disease)   . Generalized osteoarthrosis, unspecified site   . Gout, unspecified   . Pulmonary hypertension ([place])   . Pure hypercholesterolemia   . Renal insufficiency   . Unspecified cerebral artery occlusion with cerebral infarction   . Unspecified essential hypertension   . Unspecified hemorrhoids without mention of complication     Assessment: Pt is a 84 year old male presenting for assessment and management of Afib and SOB. PTA warfarin dosing: 5 mg Monday and Friday; 2.5 mg on all other days. Current INR 2.3 > 3.2 overnight.    Goal of Therapy:  INR 2-3 Monitor platelets by anticoagulation protocol: Yes   Plan:  -warfarin 1 mg po x1 -daily INR   Happy Begeman, Jake Church 05/15/2018,11:28 AM

## 2018-05-15 NOTE — Clinical Social Work Note (Signed)
Patient's daughter has chosen [facility]. CSW left message for admissions coordinator to notify and asked her to start insurance authorization.  Dayton Scrape, [place]

## 2018-05-15 NOTE — Consult Note (Signed)
Consultation Note Date: 05/15/2018   [name] Name: [name] [name]  DOB: Dec 21, 1925  MRN: [id number]  Age / Sex: 84 y.o., male  PCP: Biagio Borg, MD Referring Physician: Edwin Dada, *  Reason for Consultation: Establishing goals of care and Psychosocial/spiritual support  HPI/[name] Profile: 84 y.o. male  with past medical history of diastolic heart failure, aortic stenosis, pulmonary artery aneurysm, pulm artery HTN, CKD 3, afib on coumadin who was admitted on 05/13/2018 with increased dyspnea on exertion.  The [name] would not be able to move from his chair with out significant SOB.  Work up indicated acute on chronic heart failure exacerbation as well as probable upper respiratory illness.  [name] [name] was followed by cardiology during his stay.  His updated echo showed a progression of his valvular disease and increased pulmonary artery hypertension.  Clinical Assessment and Goals of Care:  I have reviewed medical records including EPIC notes, labs and imaging, received report from Dr. Loleta Books, assessed the [name] and then met at the bedside along with his daughter [name] [name] to discuss diagnosis prognosis, GOC, EOL wishes, disposition and options.  I introduced Palliative Medicine as specialized medical care for people living with serious illness. It focuses on providing relief from the symptoms and stress of a serious illness. The goal is to improve quality of life for both the [name] and the family.  We discussed a brief life review of the [name]. [name] worked for the [employer] for 50 years.  He retired in 1977.  He is divorced and had 6 children.  His oldest daughter lives in [place] and has rare contact with him.  His first son committed suicide.  His second daughter is [name] [name] who lives in [place] and stays in touch.  [name] [name] is his 3 daughter and is his primary care taker.   She lives near by and checks on him 2x a day.  [name] [name] has two more children [name] and [name]) who live locally but do not keep in contact with him.    [name] [name] greatest joy is good food.  Food really impacts his quality of life.  [name] [name] understands the relationship between salt and heart failure but she comments - at 84 yo he should be able to have a little salt if he wants it.  [name] lives in the house he grew up in.  Prior to the last few weeks he was ambulating with a walker and relatively independent with ADLs.  Over the last 3 weeks he has progressively become more SOB and leaned on [name] [name] for help with bathing, and daily needs.  He fell 1 week ago.  However his mind has remained sharp.  I over viewed Advanced Directive paper work with [name] [name] and [name] [name].  [name] indicated that he would want [name] [name] to be his HCPOA.  He wanted to speak with both [name] [name] and [name] [name] before completing the entire Advanced Directives packet that included the Living Will.  Privately [name] [name] and I discussed her father's heart disease  with pulmonary HTN and valvular disease and what it means in the larger context of his on-going co-morbidities.  Natural disease trajectory and expectations at EOL were discussed.  Concepts specific to code status were considered and discussed. [name] [name] understands and agrees that her father is a very poor candidate for successful resuscitation should he arrest.  Hospice and Palliative Care services outpatient were explained and offered.  [name] [name] would like for Palliative Care to follow her father outpatient.  Questions and concerns were addressed.  Hard Choices booklet left for review. The family was encouraged to call with questions or concerns.    Primary Decision Maker:  [name]. [name] [name] indicated that he would select [name] [name] to be his HCPOA.    SUMMARY OF RECOMMENDATIONS     [name] and family considering home vs SNF.  [name] [name] is feeling a bit  overwhelmed today with decisions regarding her father's care and disposition.  Palliative care to follow outpatient whether he d/c to SNF or home.  Code status was discussed with [name] and daughter.  [name] is considering.  Advanced directives were reviewed.  [name] wants to have a conversation with [name] [name] and [name] [name] together prior to completing AD.  Palliative care outpatient could help facilitate this meeting.  PMT will check in with [name] and family tomorrow to answer follow up questions and offer support.  Code Status/Advance Care Planning:  Full code  Symptom Management:   Per primary team.  [name] appears comfortable.  Palliative Prophylaxis:   Bowel Regimen  Prognosis:  6 months or less would not be surprising given recent decline, limited mobility, progressing valvular heart disease and pulmonary HTN - not a candidate for interventions   Discharge Planning: To Be Determined  Likely SNF with Palliative to follow.      Primary Diagnoses: Present on Admission: . HYPERCHOLESTEROLEMIA . Essential hypertension . ATRIAL FIBRILLATION . GOUT . Glaucoma . CKD (chronic kidney disease) stage 3, GFR 30-59 ml/min (HCC) . Pulmonary hypertension ([place]) . Aneurysm of pulmonary artery ([place]) . BPH (benign prostatic hyperplasia) . Acute on chronic respiratory failure with hypoxia ([place])   I have reviewed the medical record, interviewed the [name] and family, and examined the [name]. The following aspects are pertinent.  Past Medical History:  Diagnosis Date  . Aneurysm of pulmonary artery ([place])   . Aortic stenosis   . Atrial fibrillation ([place])   . BPH (benign prostatic hypertrophy)   . Congestive heart failure, unspecified   . CVA (cerebral infarction)   . DJD (degenerative joint disease)   . Generalized osteoarthrosis, unspecified site   . Gout, unspecified   . Pulmonary hypertension ([place])   . Pure hypercholesterolemia   . Renal insufficiency   . Unspecified  cerebral artery occlusion with cerebral infarction   . Unspecified essential hypertension   . Unspecified hemorrhoids without mention of complication    Social History   Socioeconomic History  . Marital status: Married    Spouse name: Not on file  . Number of children: 5  . Years of education: 10  . Highest education level: Not on file  Occupational History  . Occupation: Retired  Scientific laboratory technician  . Financial resource strain: Not on file  . Food insecurity:    Worry: Not on file    Inability: Not on file  . Transportation needs:    Medical: Not on file    Non-medical: Not on file  Tobacco Use  . Smoking status: Former Smoker    Years: 3.00    Types: Cigarettes,  Cigars    Last attempt to quit: 12/05/2003    Years since quitting: 14.4  . Smokeless tobacco: Never Used  . Tobacco comment: does not smoke cigars anymore  Substance and Sexual Activity  . Alcohol use: Not on file    Comment: Very rarely  . Drug use: No  . Sexual activity: Not on file  Lifestyle  . Physical activity:    Days per week: Not on file    Minutes per session: Not on file  . Stress: Not on file  Relationships  . Social connections:    Talks on phone: Not on file    Gets together: Not on file    Attends religious service: Not on file    Active member of club or organization: Not on file    Attends meetings of clubs or organizations: Not on file    Relationship status: Not on file  Other Topics Concern  . Not on file  Social History Narrative   Fun: Stay home; sitting on the porch   Denies religious beliefs effecting health care   History reviewed. No pertinent family history. Scheduled Meds: . allopurinol  100 mg Oral Daily  . amoxicillin-clavulanate  1 tablet Oral Q12H  . atorvastatin  20 mg Oral QHS  . brinzolamide  1 drop Both Eyes BID  . Calcifediol ER  30 mcg Oral QHS  . guaiFENesin  600 mg Oral BID  . irbesartan  300 mg Oral Daily  . latanoprost  1 drop Both Eyes QHS  . mouth rinse   15 mL Mouth Rinse BID  . metoprolol succinate  100 mg Oral Q1200  . pantoprazole  40 mg Oral Q supper  . pilocarpine  1 drop Left Eye BID  . potassium chloride  40 mEq Oral Q1200   And  . potassium chloride  20 mEq Oral QHS  . sodium chloride flush  3 mL Intravenous Q12H  . spironolactone  25 mg Oral Daily  . tamsulosin  0.4 mg Oral QHS  . verapamil  120 mg Oral QHS  . Warfarin - Pharmacist Dosing Inpatient   Does not apply q1800   Continuous Infusions: . sodium chloride     PRN Meds:.sodium chloride, acetaminophen, albuterol, ondansetron (ZOFRAN) IV, sodium chloride flush Allergies  Allergen Reactions  . Ibuprofen     REACTION: causes low blood pressure   Review of Systems reports SOB, weakness, slow moving bowels   Physical Exam  Elderly gentleman awake, alert, orientated, cooperative. CV irreg irreg with 3/6 systolic murmur Resp no distress on 3L.  No frank w/c/r Abdomen soft, nt, nd LE without edema  Vital Signs: BP 109/79 (BP Location: Right Arm)   Pulse 80   Temp 97.7 F (36.5 C) (Oral)   Resp 18   Ht _0  (1.803 m)   Wt 78.4 kg   SpO2 96%   BMI 24.11 kg/m  Pain Scale: 0-10   Pain Score: 0-No pain   SpO2: SpO2: 96 % O2 Device:SpO2: 96 % O2 Flow Rate: .O2 Flow Rate (L/min): 3 L/min  IO: Intake/output summary:   Intake/Output Summary (Last 24 hours) at 05/15/2018 1030 Last data filed at 05/15/2018 0900 Gross per 24 hour  Intake 600 ml  Output 1400 ml  Net -800 ml    LBM: Last BM Date: 05/13/18 Baseline Weight: Weight: 79.2 kg(scale b) Most recent weight: Weight: 78.4 kg     Palliative Assessment/Data:50%     Time In: 9:00 Time Out: 10:10 Time Total: 70  min. Greater than 50%  of this time was spent counseling and coordinating care related to the above assessment and plan.  Signed by: Florentina Jenny, PA-C Palliative Medicine Pager: [phone]  Please contact Palliative Medicine Team phone at [phone] for questions and concerns.    For individual provider: See Shea Evans

## 2018-05-16 ENCOUNTER — Other Ambulatory Visit: Payer: Self-pay | Admitting: Medical

## 2018-05-16 DIAGNOSIS — N183 Chronic kidney disease, stage 3 unspecified: Secondary | ICD-10-CM

## 2018-05-16 DIAGNOSIS — I509 Heart failure, unspecified: Secondary | ICD-10-CM

## 2018-05-16 LAB — BASIC METABOLIC PANEL
Anion gap: 7 (ref 5–15)
BUN: 34 mg/dL — AB (ref 8–23)
CHLORIDE: 106 mmol/L (ref 98–111)
CO2: 26 mmol/L (ref 22–32)
CREATININE: 1.94 mg/dL — AB (ref 0.61–1.24)
Calcium: 8.8 mg/dL — ABNORMAL LOW (ref 8.9–10.3)
GFR calc non Af Amer: 29 mL/min — ABNORMAL LOW (ref >60)
GFR, EST AFRICAN AMERICAN: 33 mL/min — AB (ref >60)
Glucose, Bld: 123 mg/dL — ABNORMAL HIGH (ref 70–99)
Potassium: 5.1 mmol/L (ref 3.5–5.1)
SODIUM: 139 mmol/L (ref 135–145)

## 2018-05-16 LAB — LEGIONELLA PNEUMOPHILA SEROGP 1 UR AG: L. pneumophila Serogp 1 Ur Ag: NEGATIVE

## 2018-05-16 LAB — PROTIME-INR
INR: 3.95
PROTHROMBIN TIME: 38.3 s — AB (ref 11.4–15.2)

## 2018-05-16 MED ORDER — WARFARIN SODIUM 5 MG PO TABS: 2.5000 mg | ORAL_TABLET | Freq: Every day | ORAL | 1 refills | Status: DC

## 2018-05-16 MED ORDER — POTASSIUM CHLORIDE CRYS ER 20 MEQ PO TBCR: 20.0000 meq | EXTENDED_RELEASE_TABLET | Freq: Two times a day (BID) | ORAL | Status: DC

## 2018-05-16 MED ORDER — HYDROCOD POLST-CPM POLST ER 10-8 MG/5ML PO SUER
5.0000 mL | Freq: Once | ORAL | Status: AC
  Administered 2018-05-16: 5 mL via ORAL
  Filled 2018-05-16 (×2): qty 5

## 2018-05-16 MED ORDER — TORSEMIDE 20 MG PO TABS: 40.0000 mg | ORAL_TABLET | Freq: Every day | ORAL | 1 refills | Status: DC

## 2018-05-16 MED ORDER — POTASSIUM CHLORIDE CRYS ER 20 MEQ PO TBCR: 20.0000 meq | EXTENDED_RELEASE_TABLET | Freq: Two times a day (BID) | ORAL | 0 refills | Status: DC

## 2018-05-16 MED ORDER — TORSEMIDE 20 MG PO TABS: 80.0000 mg | ORAL_TABLET | Freq: Every day | ORAL | 1 refills | Status: DC

## 2018-05-16 MED ORDER — FUROSEMIDE 10 MG/ML IJ SOLN: 20.0000 mg | Freq: Once | INTRAMUSCULAR | Status: DC

## 2018-05-16 NOTE — Progress Notes (Signed)
[name] for warfarin Indication: atrial fibrillation  Vital Signs: Temp: 97.8 F (36.6 C) (10/10 0454) Temp Source: Oral (10/10 0454) BP: 126/72 (10/10 0928) Pulse Rate: 67 (10/10 0928)   Medical History: Past Medical History:  Diagnosis Date  . Aneurysm of pulmonary artery ([place])   . Aortic stenosis   . Atrial fibrillation ([place])   . BPH (benign prostatic hypertrophy)   . Congestive heart failure, unspecified   . CVA (cerebral infarction)   . DJD (degenerative joint disease)   . Generalized osteoarthrosis, unspecified site   . Gout, unspecified   . Pulmonary hypertension ([place])   . Pure hypercholesterolemia   . Renal insufficiency   . Unspecified cerebral artery occlusion with cerebral infarction   . Unspecified essential hypertension   . Unspecified hemorrhoids without mention of complication     Assessment: Pt is a 84 year old male presenting for assessment and management of Afib and SOB. PTA warfarin dosing: 5 mg Monday and Friday; 2.5 mg on all other days. Current INR 3.9, no bleeding issues. INR has been trending up over past couple days, patient has been receiving a lower dose than his prior to admission dose.     Goal of Therapy:  INR 2-3 Monitor platelets by anticoagulation protocol: Yes   Plan:  -hold warfarin today as INR is nearing 4 -daily INR   Xylah Early, Jake Church 05/16/2018,10:30 AM

## 2018-05-16 NOTE — Clinical Social Work Note (Signed)
CSW facilitated patient discharge including contacting patient family and facility to confirm patient discharge plans. Clinical information faxed to facility and family agreeable with plan. CSW arranged ambulance transport via [facility] to [facility]. RN to call report prior to discharge [phone] Room [phone]).  CSW will sign off for now as social work intervention is no longer needed. Please consult [place] again if new needs arise.  Dayton Scrape, [place]

## 2018-05-16 NOTE — Clinical Social Work Note (Addendum)
Insurance authorization still pending.  Dayton Scrape, [place] [phone]  12:00 pm Insurance authorization approved. Daughter aware. Sent message to MD to notify.  Dayton Scrape, [place]

## 2018-05-16 NOTE — Progress Notes (Signed)
Physical Therapy Treatment Patient Details Name: [name] [name] MRN: [id number] DOB: [birth year] Today's Date: 05/16/2018    History of Present Illness Pt 84yo male presenting with worsening SOB and productive cough over last three weeks. Bil pleural effusion noted. PMhx: CHF (EF 75-80%), HTN, BPH, CKD III, Afib, O2 dependent 2.5L, CVA, gout, OA, pulm artery aneurysm, and aortic stenosis.     PT Comments    Pt is progressing toward goals; able to transfer and perform LE exercises--HR 70s to 105 max, SpO2 = 90% or greater  on 2.5L  Follow Up Recommendations  SNF;Supervision/Assistance - 24 hour     Equipment Recommendations  None recommended by PT    Recommendations for Other Services       Precautions / Restrictions Precautions Precautions: Fall Precaution Comments: Low vision, HR elevated previous session, no issues today Restrictions Weight Bearing Restrictions: No    Mobility  Bed Mobility Overal bed mobility: Modified Independent             General bed mobility comments: incr time, bed flat  Transfers Overall transfer level: Needs assistance Equipment used: None Transfers: Sit to/from [facility] Sit to Stand: Min assist Stand pivot transfers: Min guard       General transfer comment: increased time, 2 attempts to come to full stand, light min assist from bed for anterior wt translation  Ambulation/Gait                 Stairs             Wheelchair Mobility    Modified Rankin (Stroke Patients Only)       Balance Overall balance assessment: Needs assistance Sitting-balance support: Feet supported;No upper extremity supported Sitting balance-Leahy Scale: Good     Standing balance support: During functional activity;Single extremity supported Standing balance-Leahy Scale: Fair Standing balance comment: reliant on unilateral UE support for dynamic task                            Cognition  Arousal/Alertness: Awake/alert Behavior During Therapy: WFL for tasks assessed/performed Overall Cognitive Status: Within Functional Limits for tasks assessed                                        Exercises General Exercises - Lower Extremity Long Arc Quad: AROM;Both;Seated;20 reps Toe Raises: AROM;15 reps;Seated Heel Raises: AROM;Both;15 reps;Seated    General Comments        Pertinent Vitals/Pain Pain Assessment: No/denies pain    Home Living                      Prior Function            PT Goals (current goals can now be found in the care plan section) Acute Rehab PT Goals Patient Stated Goal: return home PT Goal Formulation: With patient/family Time For Goal Achievement: 05/28/18 Potential to Achieve Goals: Good Progress towards PT goals: Progressing toward goals    Frequency    Min 3X/week      PT Plan Current plan remains appropriate    Co-evaluation              AM-PAC PT "6 Clicks" Daily Activity  Outcome Measure  Difficulty turning over in bed (including adjusting bedclothes, sheets and blankets)?: A Little Difficulty moving from lying on back to sitting  on the side of the bed? : A Little Difficulty sitting down on and standing up from a chair with arms (e.g., wheelchair, bedside commode, etc,.)?: Unable Help needed moving to and from a bed to chair (including a wheelchair)?: A Little Help needed walking in hospital room?: A Little Help needed climbing 3-5 steps with a railing? : A Lot 6 Click Score: 15    End of Session Equipment Utilized During Treatment: Gait belt Activity Tolerance: Patient tolerated treatment well Patient left: in chair;with call bell/phone within reach;with chair alarm set   PT Visit Diagnosis: Muscle weakness (generalized) (M62.81);Other abnormalities of gait and mobility (R26.89)     Time: 3299-2426 PT Time Calculation (min) (ACUTE ONLY): 23 min  Charges:  $Therapeutic Activity:  23-37 mins                     Kenyon Ana, PT  Pager: [phone] Acute Rehab Dept [facility]): [phone]   05/16/2018    [facility] 05/16/2018, 12:54 PM

## 2018-05-16 NOTE — Progress Notes (Signed)
Patient discharged to [facility].Report  Given to [facility]. PIV removed no s/sx if swelling or infiltration noted. Patient alert and oriented no distress noted. Awaiting transport

## 2018-05-16 NOTE — Care Management Important Message (Signed)
Important Message  Patient Details  Name: [name] [name] MRN: [id number] Date of Birth: 1926/01/27   Medicare Important Message Given:  Yes    [name] P [name] 05/16/2018, 4:22 PM

## 2018-05-16 NOTE — Clinical Social Work Placement (Signed)
   CLINICAL SOCIAL WORK PLACEMENT  NOTE  Date:  05/16/2018  Patient Details  Name: [name] [name] MRN: [id number] Date of Birth: 02/03/1926  Clinical Social Work is seeking post-discharge placement for this patient at the [place] level of care (*CSW will initial, date and re-position this form in  chart as items are completed):  Yes   Patient/family provided with [place] Work Department's list of facilities offering this level of care within the geographic area requested by the patient (or if unable, by the patient's family).  Yes   Patient/family informed of their freedom to choose among providers that offer the needed level of care, that participate in Medicare, Medicaid or managed care program needed by the patient, have an available bed and are willing to accept the patient.  Yes   Patient/family informed of [facility]'s ownership interest in [facility] and [facility], as well as of the fact that they are under no obligation to receive care at these facilities.  PASRR submitted to EDS on 05/14/18     PASRR number received on 05/14/18     Existing PASRR number confirmed on       FL2 transmitted to all facilities in geographic area requested by pt/family on 05/14/18     FL2 transmitted to all facilities within larger geographic area on       Patient informed that his/her managed care company has contracts with or will negotiate with certain facilities, including the following:        Yes   Patient/family informed of bed offers received.  Patient chooses bed at [facility]     Physician recommends and patient chooses bed at      Patient to be transferred to [facility] on 05/16/18.  Patient to be transferred to facility by [facility]     Patient family notified on 05/16/18 of transfer.  Name of family member notified:  Jacklynn Bue     PHYSICIAN Please prepare prescriptions     Additional Comment:     _______________________________________________ Candie Chroman, LCSW 05/16/2018, 1:30 PM

## 2018-05-16 NOTE — Progress Notes (Addendum)
Progress Note  Patient Name: [name] [name] Date of Encounter: 05/16/2018  Primary Cardiologist: Minus Breeding, MD   Subjective   Patient reported some coughing overnight which made it difficult for home to sleep. Relieved with cough suppressant. He is hopeful to get out of the hospital soon.   Inpatient Medications    Scheduled Meds: . allopurinol  100 mg Oral Daily  . atorvastatin  20 mg Oral QHS  . brinzolamide  1 drop Both Eyes BID  . Calcifediol ER  30 mcg Oral QHS  . guaiFENesin  600 mg Oral BID  . irbesartan  300 mg Oral Daily  . latanoprost  1 drop Both Eyes QHS  . mouth rinse  15 mL Mouth Rinse BID  . metoprolol succinate  100 mg Oral Q1200  . pantoprazole  40 mg Oral Q supper  . pilocarpine  1 drop Left Eye BID  . potassium chloride  40 mEq Oral Q1200   And  . potassium chloride  20 mEq Oral QHS  . sodium chloride flush  3 mL Intravenous Q12H  . spironolactone  25 mg Oral Daily  . tamsulosin  0.4 mg Oral QHS  . torsemide  40 mg Oral q1800  . torsemide  80 mg Oral QAC breakfast  . verapamil  120 mg Oral QHS  . Warfarin - Pharmacist Dosing Inpatient   Does not apply q1800   Continuous Infusions: . sodium chloride     PRN Meds: sodium chloride, acetaminophen, albuterol, ondansetron (ZOFRAN) IV, sodium chloride flush   Vital Signs    Vitals:   05/16/18 0233 05/16/18 0454 05/16/18 0500 05/16/18 0928  BP:  97/65  126/72  Pulse:  (!) 58  67  Resp:  18    Temp:  97.8 F (36.6 C)    TempSrc:  Oral    SpO2: 98% 97%    Weight:   78.6 kg   Height:        Intake/Output Summary (Last 24 hours) at 05/16/2018 1018 Last data filed at 05/16/2018 0500 Gross per 24 hour  Intake 720 ml  Output 1000 ml  Net -280 ml   Filed Weights   05/14/18 0347 05/15/18 0519 05/16/18 0500  Weight: 78.9 kg 78.4 kg 78.6 kg    Telemetry    Not currently on telemetry monitor - Personally Reviewed  Physical Exam   GEN: Sitting upright in bed in no acute distress.     Neck: No JVD, no carotid bruits Cardiac: IRRR, +murmur, no rubs or gallops.  Respiratory: Clear to auscultation bilaterally, no wheezes/ rales/ rhonchi GI: NABS, Soft, nontender, non-distended  MS: No edema; No deformity. Neuro:  Nonfocal, moving all extremities spontaneously Psych: Normal affect   Labs    Chemistry Recent Labs  Lab 05/14/18 0357 05/15/18 0351 05/16/18 0418  NA 141 140 139  K 3.7 4.7 5.1  CL 105 104 106  CO2 26 28 26   GLUCOSE 116* 111* 123*  BUN 25* 29* 34*  CREATININE 1.73* 1.88* 1.94*  CALCIUM 9.0 8.8* 8.8*  GFRNONAA 33* 30* 29*  GFRAA 38* 35* 33*  ANIONGAP 10 8 7      Hematology Recent Labs  Lab 05/13/18 1534 05/14/18 0357 05/14/18 0913 05/15/18 0351  WBC 8.0  --  8.3 7.2  RBC 4.06*  --  4.09* 3.78*  HGB 12.9*  --  13.2 12.2*  HCT 41.9 35.7* 41.6 38.4*  MCV 103.2*  --  101.7* 101.6*  MCH 31.8  --  32.3 32.3  MCHC 30.8  --  31.7 31.8  RDW 16.8*  --  16.7* 16.7*  PLT 191  --  182 184    Cardiac Enzymes Recent Labs  Lab 05/13/18 2134 05/14/18 0357 05/14/18 0913  TROPONINI 0.03* 0.03* 0.03*    Recent Labs  Lab 05/13/18 1541  TROPIPOC 0.03     BNP Recent Labs  Lab 05/13/18 1651  BNP 974.3*     DDimer No results for input(s): DDIMER in the last 168 hours.   Radiology    No results found.  Cardiac Studies   Echo 05/14/2018 - Left ventricle: The cavity size was normal. Wall thickness was increased in a pattern of mild LVH. Systolic function was normal. The estimated ejection fraction was in the range of 55% to 60%. Wall motion was normal; there were no regional wall motion abnormalities. - Aortic valve: Valve mobility was restricted. There was moderate stenosis. There was trivial regurgitation. Valve area (VTI): 1.16 cm^2. Valve area (Vmax): 1.03 cm^2. Valve area (Vmean): 1.07 cm^2. - Mitral valve: Calcified annulus. There was mild regurgitation. - Left atrium: The atrium was severely dilated. - Right  atrium: The atrium was severely dilated. - Tricuspid valve: There was moderate regurgitation. - Pulmonary arteries: Systolic pressure was severely increased. PA peak pressure: 67 mm Hg (S).  Impressions:  - Normal LV systolic function; mild LVH; calcified aortic valve with moderate AS (mean gradient 27 mmHg) and trace AI; mild MR; severe biatrial enlargement; moderate TR; severe pulmonary hypertension.  Patient Profile     84 y.o. 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 Acute on chronic diastolic CHF: patient presented with SOB. He was diuresed with IV lasix and transitioned to po regimen 05/14/18. He is maintaining a net nil/- UOP status. Cr continues to uptrend a bit to 1.94 today.  - Continue po lasix at current dose  2. Severe aortic stenosis: Echo this admission shows progression of AS to severe. Not a TAVR candidate due to advanced age and multiple comorbidities.   3. Permanent atrial fibrillation: rate well controlled. INR supratherapeutic again today at 3.95 - Continue warfarin per pharmacy  - Continue metoprolol for rate control  4. Chronic hypoxic respiratory failure: multifactorial in the setting of CHF, pulmonary HTN, and severe aortic stenosis.  - Continue home O2.   5. Severe Pulmonary HTN: Not a candidate for medical therapy - Continue home O2  6. HTN: BP stable - Continue current regimen  7. CKD stage 3-4: Cr up again today at 1.94 - Continue to monitor closely  8. GOC discussion: Seen by Palliative care yesterday. Patient is interested in following outpatient. Ongoing discussions being held to determine code status.   He has an appointment to see Dr. Percival Spanish 08/12/18  at 11:40am. Coumadin clinic appointment on 05/28/18 at 3pm.   For questions or updates, please contact [facility] Please consult [email or web address] for contact info under Cardiology/STEMI.      Signed, Abigail Butts, PA-C  05/16/2018, 10:18 AM   [phone]  I have seen and examined the patient along with Abigail Butts, PA-C .  I have reviewed the chart, notes and new data.  I agree with PA/NP's note.  Key new complaints: Breathing appears to be back to baseline Key examination changes: No  overt findings of hypervolemia, improved to JVD  Key new findings / data: Mild increase in creatinine from baseline.  Potassium high normal  PLAN: No plan for additional investigation or medication changes by cardiology.  Ready for discharge once home living support and social issues have been addressed.  [facility] will sign off.   Medication Recommendations: Would continue cardiac medications at current doses.  Advised to daily weights, call us if he either loses or gains more than 3 pounds from today's weight. Other recommendations (labs, testing, etc):  Coumadin clinic appointment on 05/28/18 at 3pm.  We can also recheck a bmet at that visit Follow up as an outpatient: He has a follow-up visit with Dr. Percival Spanish in January, but will bring him in sooner, in roughly 3-4 weeks to reevaluate the dose of diuretic.   Sanda Klein, MD, [place] [phone] 05/16/2018, 11:23 AM

## 2018-05-16 NOTE — Plan of Care (Signed)
Patient urinary output is normal. He voided at least 45ml per hour during the shift. He has on a condom cath to ensure accurate output measurement.

## 2018-05-16 NOTE — Discharge Summary (Signed)
Physician Discharge Summary  [name] [name] WUX:[id number] DOB: 12-07-1925 DOA: 05/13/2018  PCP: Biagio Borg, MD  Admit date: 05/13/2018 Discharge date: 05/16/2018  Admitted From: Home  Disposition:  SNF   Recommendations for Outpatient Follow-up:  1. Follow up with Cardiology in 3-4 weeks 2. Please obtain BMP to evaluate creatinine in 1 week 3. Please check INR on Sunday Oct 13, and adjust warfarin as needed 4. Weigh daily.  If >3lbs weight gain, call Dr. Rosezella [place] office at [facility] Cardiology 5. Please consult Palliative Care at SNF     Home Health: N/A  Equipment/Devices: TBD at SNF  Discharge Condition: Fair  CODE STATUS: FULL Diet recommendation: Cardiac  Brief/Interim Summary: Mr. Kwong is a 84 y.o. M with dCHF, aortic stenosis and pHTN with pulmonary artery aneurysm, chronic respiratory failrue on O2, Afib on warfarin, and CKD III baseline Cr. 1.7 who presents with several weeks progressive dyspnea on exertion, weakness.  Found to have pedal edema, started on IV Lasix.       Discharge Diagnoses:   Acute on chronic diastolic CHF Acute right sided CHF Pulmonary hypertension Severe aortic stenosis Was diuresed with IV Lasix. Net negative 1.5L. Some improvement in dyspnea, but functional status poor.  Echo repeated and Cardiology suspect that his symptoms are related to progression of his AS.  Not TAVR candidate.  Palliative care are involved, and his prognosis is likely less than 6 months.  Cardiology recommended torsemide as new diuretic.  Follow up in 3 weeks.   Community acquired pneumonia Augmentin started for new infiltrates by Pulmonology right before admission.  Completed 7 days.  Atrial fibrillation, chronic CHA2DS2-Vasc 4.  On warfarin.   Chronic kidney disease, stage III to IV Cr fluctuating in range of CKD stage III to IV, stable today..  Hypertension No change to regimen.        Discharge Instructions  Discharge Instructions     Diet - low sodium heart healthy   Complete by:  As directed    Discharge instructions   Complete by:  As directed    From Dr. Loleta Books: You were admitted to the hospital with slowly progressing feeling out of breath.  We treated you with some diuretic (to take off excess fluid, which can contribute to feeling out of breath). We also performed an "echo" or a heart ultrasound, that showed that your valve disease is progressing.  This is a challenging situation, and unfortunately, there are not ways to completely *fix* the problem.   We have adjusted your diuretic dose (of torsemide) to 80 mg in the morning and 40 mg at night. Start this new dose. Have the skilled nursing facility check your kidney function in 5 days.  Hold your warfarin today Thursday Oct 10, and resume Friday evening. Take warfarin 2.5 mg daily Have the skilled nursing facility check your INR on Sunday and adjust your warfarin if needed.   Increase activity slowly   Complete by:  As directed      Allergies as of 05/16/2018      Reactions   Ibuprofen    REACTION: causes low blood pressure      Medication List    STOP taking these medications   amoxicillin-clavulanate 875-125 MG tablet Commonly known as:  AUGMENTIN     TAKE these medications   allopurinol 100 MG tablet Commonly known as:  ZYLOPRIM TAKE 1 TABLET(100 MG) BY MOUTH DAILY. What changed:    how much to take  how to take this  when  to take this  additional instructions   atorvastatin 20 MG tablet Commonly known as:  LIPITOR Take 1 tablet (20 mg total) by mouth daily. What changed:  when to take this   bimatoprost 0.01 % Soln Commonly known as:  LUMIGAN Place 1 drop into both eyes at bedtime.   brinzolamide 1 % ophthalmic suspension Commonly known as:  AZOPT Place 1 drop into both eyes 2 (two) times daily.   eplerenone 25 MG tablet Commonly known as:  INSPRA Take 1 tablet (25 mg total) by mouth daily.   metoprolol succinate 100  MG 24 hr tablet Commonly known as:  TOPROL-XL TAKE 1 TABLET BY MOUTH DAILY WITH OR IMMEDIATELY FOLLOWING A MEAL What changed:  See the new instructions.   multivitamin tablet Take 1 tablet by mouth daily.   omeprazole 20 MG capsule Commonly known as:  PRILOSEC TAKE 1 CAPSULE(20 MG) BY MOUTH DAILY What changed:  See the new instructions.   pilocarpine 4 % ophthalmic solution Commonly known as:  PILOCAR Place 1 drop into the left eye 2 (two) times daily.   potassium chloride SA 20 MEQ tablet Commonly known as:  K-DUR,KLOR-CON Take 1 tablet (20 mEq total) by mouth 2 (two) times daily. Start taking on:  05/17/2018 What changed:    how much to take  when to take this   RAYALDEE 30 MCG Cpcr Generic drug:  Calcifediol ER Take 30 mcg by mouth at bedtime.   tamsulosin 0.4 MG Caps capsule Commonly known as:  FLOMAX Take 1 capsule (0.4 mg total) by mouth 2 (two) times daily. What changed:  when to take this   telmisartan 80 MG tablet Commonly known as:  MICARDIS Take 1 tablet (80 mg total) by mouth daily. What changed:  when to take this   torsemide 20 MG tablet Commonly known as:  DEMADEX Take 2 tablets (40 mg total) by mouth daily at 6 PM. What changed:  You were already taking a medication with the same name, and this prescription was added. Make sure you understand how and when to take each.   torsemide 20 MG tablet Commonly known as:  DEMADEX Take 4 tablets (80 mg total) by mouth daily before breakfast. Start taking on:  05/17/2018 What changed:  See the new instructions.   verapamil 120 MG CR tablet Commonly known as:  CALAN-SR TAKE 1 TABLET(120 MG) BY MOUTH AT BEDTIME What changed:  See the new instructions.   warfarin 5 MG tablet Commonly known as:  COUMADIN Take as directed. If you are unsure how to take this medication, talk to your nurse or doctor. Original instructions:  Take 0.5 tablets (2.5 mg total) by mouth daily at 6 PM. Start taking on:   05/17/2018 What changed:  See the new instructions.       Contact information for follow-up providers    [place] [facility] Follow up on 05/28/2018.   Specialty:  Cardiology Why:  Please arrive 15 minutes early for your 3:00pm coumadin clinic appointment. You also need to have additional blood work done to monitor your kidney function at that visit. Please stop by the lab prior to leaving your coumadin appointment.  Contact information: [address], [place] [phone]       Lonn Georgia, PA-C Follow up on 06/03/2018.   Specialties:  Cardiology, Radiology Why:  Please arrive 15 minutes early for your 1:30pm post-hospital cardiology appointment.  Contact information: [address] [place]            Contact information for after-discharge care    Destination    [facility] .   Service:  Skilled Nursing Contact information: [place] [id number] [phone]                 Allergies  Allergen Reactions  . Ibuprofen     REACTION: causes low blood pressure    Consultations:  Cardiology  Palliative Care   Procedures/Studies: Dg Chest 2 View  Result Date: 05/13/2018 CLINICAL DATA:  Shortness of Breath EXAM: CHEST - 2 VIEW COMPARISON:  05/09/2018 FINDINGS: Cardiac shadow is stable. Elevation of the left hemidiaphragm is again seen. Aortic calcifications are again noted. Bibasilar atelectasis are again identified slightly increased on the right when compared with the prior exam. No new focal infiltrate or effusion is seen. Degenerative changes of the thoracic spine are noted. IMPRESSION: Slight increase in the degree of right basilar atelectasis. Electronically Signed   By: Inez Catalina M.D.   On: 05/13/2018 16:16   Dg Chest 2 View  Result Date: 05/09/2018 CLINICAL DATA:  Dyspnea, cough, and wheezing for the past 3  weeks. Symptoms of indigestion. History of CHF, pulmonary hypertension, coronary artery disease, valvular heart disease. EXAM: CHEST - 2 VIEW COMPARISON:  Chest x-ray of March 22, 2017 FINDINGS: There is chronic elevation of the left hemidiaphragm. There is new patchy density at both lung bases. There is a small amount of pleural fluid on the right which is not new. The cardiac silhouette is enlarged. The right hilar structures remain prominent. There is tortuosity of the ascending and descending thoracic aorta with mural calcification. There is multilevel degenerative disc disease of the thoracic spine. IMPRESSION: Chronic bronchitic smoking related changes. New bibasilar atelectasis with chronic small bilateral pleural effusions. Low-grade CHF. Thoracic aortic atherosclerosis. Electronically Signed   By: David  Martinique M.D.   On: 05/09/2018 15:25  Echocardiogram LV EF: 55% -   60%  ------------------------------------------------------------------- Indications:      CHF - 428.0.  ------------------------------------------------------------------- History:   PMH:   Atrial fibrillation.  Congestive heart failure. Aortic valve disease.  Stroke.  Risk factors:  Hypertension.  ------------------------------------------------------------------- Study Conclusions  - Left ventricle: The cavity size was normal. Wall thickness was   increased in a pattern of mild LVH. Systolic function was normal.   The estimated ejection fraction was in the range of 55% to 60%.   Wall motion was normal; there were no regional wall motion   abnormalities. - Aortic valve: Valve mobility was restricted. There was moderate   stenosis. There was trivial regurgitation. Valve area (VTI): 1.16   cm^2. Valve area (Vmax): 1.03 cm^2. Valve area (Vmean): 1.07   cm^2. - Mitral valve: Calcified annulus. There was mild regurgitation. - Left atrium: The atrium was severely dilated. - Right atrium: The atrium was severely  dilated. - Tricuspid valve: There was moderate regurgitation. - Pulmonary arteries: Systolic pressure was severely increased. PA   peak pressure: 67 mm Hg (S).  Impressions:  - Normal LV systolic function; mild LVH; calcified aortic valve   with moderate AS (mean gradient 27 mmHg) and trace AI; mild MR;   severe biatrial enlargement; moderate TR; severe pulmonary   hypertension.        Subjective: No new complaints.  No chest pain, no change in dyspnea.  No fever, sputum.  No confusion.  No vomiting, no abdominal pain.  No new leg  swelling.  Discharge Exam: Vitals:   05/16/18 0454 05/16/18 0928  BP: 97/65 126/72  Pulse: (!) 58 67  Resp: 18   Temp: 97.8 F (36.6 C)   SpO2: 97%    Vitals:   05/16/18 0233 05/16/18 0454 05/16/18 0500 05/16/18 0928  BP:  97/65  126/72  Pulse:  (!) 58  67  Resp:  18    Temp:  97.8 F (36.6 C)    TempSrc:  Oral    SpO2: 98% 97%    Weight:   78.6 kg   Height:        General: Pt is alert, awake, not in acute distress, sitting up in bed, interactive, daughters at bedside Cardiovascular: RRR, N3/I1 +, systolic murmur noted, no rubs, no gallops, no LE edema Respiratory: CTA bilaterally, no wheezing, no rhonchi Abdominal: Soft, no TTP, no distension Extremities: no edema, no cyanosis; diffuse loss of subQ fat and muscle    The results of significant diagnostics from this hospitalization (including imaging, microbiology, ancillary and laboratory) are listed below for reference.     Microbiology: No results found for this or any previous visit (from the past 240 hour(s)).   Labs: BNP (last 3 results) Recent Labs    05/13/18 1651  BNP 443.1*   Basic Metabolic Panel: Recent Labs  Lab 05/13/18 1534 05/14/18 0357 05/14/18 0913 05/15/18 0351 05/16/18 0418  NA 139 141  --  140 139  K 4.0 3.7  --  4.7 5.1  CL 105 105  --  104 106  CO2 22 26  --  28 26  GLUCOSE 127* 116*  --  111* 123*  BUN 27* 25*  --  29* 34*  CREATININE  1.80* 1.73*  --  1.88* 1.94*  CALCIUM 9.3 9.0  --  8.8* 8.8*  MG  --   --  2.0  --   --    Liver Function Tests: No results for input(s): AST, ALT, ALKPHOS, BILITOT, PROT, ALBUMIN in the last 168 hours. No results for input(s): LIPASE, AMYLASE in the last 168 hours. No results for input(s): AMMONIA in the last 168 hours. CBC: Recent Labs  Lab 05/13/18 1534 05/14/18 0357 05/14/18 0913 05/15/18 0351  WBC 8.0  --  8.3 7.2  NEUTROABS  --   --  6.1  --   HGB 12.9*  --  13.2 12.2*  HCT 41.9 35.7* 41.6 38.4*  MCV 103.2*  --  101.7* 101.6*  PLT 191  --  182 184   Cardiac Enzymes: Recent Labs  Lab 05/13/18 2134 05/14/18 0357 05/14/18 0913  TROPONINI 0.03* 0.03* 0.03*   BNP: Invalid input(s): POCBNP CBG: No results for input(s): GLUCAP in the last 168 hours. D-Dimer No results for input(s): DDIMER in the last 72 hours. Hgb A1c No results for input(s): HGBA1C in the last 72 hours. Lipid Profile No results for input(s): CHOL, HDL, LDLCALC, TRIG, CHOLHDL, LDLDIRECT in the last 72 hours. Thyroid function studies Recent Labs    05/13/18 2134  TSH 3.308   Anemia work up Recent Labs    05/14/18 0913  VITAMINB12 758   Urinalysis    Component Value Date/Time   COLORURINE YELLOW 05/14/2018 [place] 05/14/2018 0251   LABSPEC 1.006 05/14/2018 0251   PHURINE 6.0 05/14/2018 0251   GLUCOSEU NEGATIVE 05/14/2018 0251   GLUCOSEU NEGATIVE 08/21/2013 1700   [place] NEGATIVE 05/14/2018 0251   BILIRUBINUR NEGATIVE 05/14/2018 0251   KETONESUR NEGATIVE 05/14/2018 0251   PROTEINUR NEGATIVE  05/14/2018 0251   UROBILINOGEN 0.2 08/21/2013 1700   NITRITE NEGATIVE 05/14/2018 0251   LEUKOCYTESUR NEGATIVE 05/14/2018 0251   Sepsis Labs Invalid input(s): PROCALCITONIN,  WBC,  LACTICIDVEN Microbiology No results found for this or any previous visit (from the past 240 hour(s)).   Time coordinating discharge: 40 minutes       SIGNED:   Edwin Dada,  MD  [facility] 05/16/2018, 12:09 PM

## 2018-05-16 NOTE — Progress Notes (Signed)
Patient is resting comfortably after receiving breathing treatment and tussionex . Will continue to monitor for safety.

## 2018-05-16 NOTE — Progress Notes (Signed)
Patient complains of SOB and feeling different. Checked VS. BP 83/67; R-26; heart rate 46 (nonsustained). Lungs diminished and he has a persistent cough. Paged NP Schorr to inform her of the patient's current status. Also called RT to come assess the patient as well as give him a breathing treatment.   NP Schorr returned page with orders to reassess after the he receives his breathing treatment. Also said not to give lasix due to low blood pressure. She is also putting in a order tussionex for his cough. Will continue to monitor for patient safety

## 2018-05-17 ENCOUNTER — Telehealth: Payer: Self-pay | Admitting: *Deleted

## 2018-05-17 NOTE — Telephone Encounter (Signed)
Pt was on TCM report admitted 05/13/18 for several weeks progressive dyspnea on exertion, weakness. She was found to have pedal edema, and started on IV Lasix. Pt also had an "echo" or a heart ultrasound performed which showed that his valve disease is progressing. Pt D/C 05/16/18 to SNF will follow-up w/cardiology once he has been discharge.Marland KitchenJohny Chess

## 2018-05-27 ENCOUNTER — Encounter: Payer: Self-pay | Admitting: Pulmonary Disease

## 2018-05-28 ENCOUNTER — Other Ambulatory Visit: Payer: Medicare Other

## 2018-06-03 ENCOUNTER — Encounter: Payer: Self-pay | Admitting: Physician Assistant

## 2018-06-03 ENCOUNTER — Ambulatory Visit: Payer: Medicare Other | Admitting: Physician Assistant

## 2018-06-03 VITALS — BP 92/64 | HR 71 | Ht 71.0 in | Wt 183.4 lb

## 2018-06-03 DIAGNOSIS — I5032 Chronic diastolic (congestive) heart failure: Secondary | ICD-10-CM | POA: Diagnosis not present

## 2018-06-03 DIAGNOSIS — I482 Chronic atrial fibrillation, unspecified: Secondary | ICD-10-CM | POA: Diagnosis not present

## 2018-06-03 DIAGNOSIS — N183 Chronic kidney disease, stage 3 unspecified: Secondary | ICD-10-CM

## 2018-06-03 DIAGNOSIS — I350 Nonrheumatic aortic (valve) stenosis: Secondary | ICD-10-CM

## 2018-06-03 NOTE — Patient Instructions (Addendum)
Medication Instructions:  Stop taking Eplerenone If you need a refill on your cardiac medications before your next appointment, please call your pharmacy.   Lab work: BMET If you have labs (blood work) drawn today and your tests are completely normal, you will receive your results only by: Marland Kitchen MyChart Message (if you have MyChart) OR . A paper copy in the mail If you have any lab test that is abnormal or we need to change your treatment, we will call you to review the results.  Testing/Procedures: None Ordered.  Follow-Up: At [facility], you and your health needs are our priority.  As part of our continuing mission to provide you with exceptional heart care, we have created designated Provider Care Teams.  These Care Teams include your primary Cardiologist (physician) and Advanced Practice Providers (APPs -  Physician Assistants and Nurse Practitioners) who all work together to provide you with the care you need, when you need it. Marland Kitchen Keep Appointment with Dr.Hochrein as scheduled.  Any Other Special Instructions Will Be Listed Below (If Applicable). Continue daily weights, daughter is to check them weekly.  Limit sodium to 500 mg per meal.

## 2018-06-03 NOTE — Progress Notes (Addendum)
Cardiology Office Note   Date:  06/03/2018   ID:  [name] [name], DOB 1926/04/29, MRN [id number]  PCP:  Biagio Borg, MD Cardiologist:  Minus Breeding, MD 01/15/2018 Dr Sallyanne Kuster (in consult) - 05/16/2018 in-hospital Rosaria Ferries, PA-C   Chief Complaint  Patient presents with  . Shortness of Breath    History of Present Illness: [name] [name] is a 84 y.o. male with a history of D-CHF w/ EF 75% 03/2017 echo, severe AS, chronic hypoxic respiratory failure on 2.5L O2, perm Afib on coumadin w/ CHA2DS2-VASc = 6 (age x 2, CHF, CVA x 2, HTN)  [name] [name] presents for cardiology follow up. His daughter is with him.  He is at [facility] for rehab. He feels he is getting stronger.   Denies PND. Has mild orthopnea, not all the time. Has DOE, not sure if he is breathing harder than he was last week. Feels in general that he is breathing much better than he was on admission.   His daughter has noticed increased LE edema, not sure how long that has been going on.  She is also not sure he is on a low-sodium diet.  Another daughter ate with him recently and felt the food was very salty.  He got dizzy once, does not usually have that.  It was apparently orthostatic in nature.  He never feels the atrial fib.  [place] is following his coumadin.   He saw a renal doctor in the past, but it has been several years.  His daughter does not remember the name.   Past Medical History:  Diagnosis Date  . 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 Aortic stenosis   . Atrial fibrillation ([place])   . BPH (benign prostatic hypertrophy)   . Congestive heart failure, unspecified   . CVA (cerebral infarction)   . DJD (degenerative joint disease)   . Generalized osteoarthrosis, unspecified site   . Gout, unspecified   . HTN (hypertension), benign   . Pulmonary hypertension ([place])   . Pure hypercholesterolemia   . Renal insufficiency   . Unspecified cerebral artery occlusion with  cerebral infarction   . Unspecified hemorrhoids without mention of complication     Past Surgical History:  Procedure Laterality Date  . APPENDECTOMY    . CATARACT EXTRACTION    . cosmetic eye surgery    . INGUINAL HERNIA REPAIR      Current Outpatient Medications  Medication Sig Dispense Refill  . allopurinol (ZYLOPRIM) 100 MG tablet TAKE 1 TABLET(100 MG) BY MOUTH DAILY. (Patient taking differently: Take 100 mg by mouth daily. ) 30 tablet 5  . atorvastatin (LIPITOR) 20 MG tablet Take 1 tablet (20 mg total) by mouth daily. (Patient taking differently: Take 20 mg by mouth at bedtime. ) 30 tablet 5  . bimatoprost (LUMIGAN) 0.01 % SOLN Place 1 drop into both eyes at bedtime.    . brinzolamide (AZOPT) 1 % ophthalmic suspension Place 1 drop into both eyes 2 (two) times daily.      . Calcifediol ER (RAYALDEE) 30 MCG CPCR Take 30 mcg by mouth at bedtime.     Marland Kitchen eplerenone (INSPRA) 25 MG tablet Take 1 tablet (25 mg total) by mouth daily. 90 tablet 2  . metoprolol succinate (TOPROL-XL) 100 MG 24 hr tablet TAKE 1 TABLET BY MOUTH DAILY WITH OR IMMEDIATELY FOLLOWING A MEAL (Patient taking differently: Take 100 mg by mouth daily at 12 noon. ) 90 tablet 3  .  Multiple Vitamin (MULTIVITAMIN) tablet Take 1 tablet by mouth daily.    Marland Kitchen omeprazole (PRILOSEC) 20 MG capsule TAKE 1 CAPSULE(20 MG) BY MOUTH DAILY (Patient taking differently: Take 20 mg by mouth daily as needed (reflux). ) 90 capsule 0  . pilocarpine (PILOCAR) 4 % ophthalmic solution Place 1 drop into the left eye 2 (two) times daily.     . potassium chloride SA (K-DUR,KLOR-CON) 20 MEQ tablet Take 1 tablet (20 mEq total) by mouth 2 (two) times daily. 30 tablet 0  . tamsulosin (FLOMAX) 0.4 MG CAPS capsule Take 1 capsule (0.4 mg total) by mouth 2 (two) times daily. (Patient taking differently: Take 0.4 mg by mouth at bedtime. ) 60 capsule 11  . telmisartan (MICARDIS) 80 MG tablet Take 1 tablet (80 mg total) by mouth daily. (Patient taking differently:  Take 80 mg by mouth daily at 12 noon. ) 90 tablet 3  . torsemide (DEMADEX) 20 MG tablet Take 4 tablets (80 mg total) by mouth daily before breakfast. 30 tablet 1  . torsemide (DEMADEX) 20 MG tablet Take 2 tablets (40 mg total) by mouth daily at 6 PM. 30 tablet 1  . verapamil (CALAN-SR) 120 MG CR tablet TAKE 1 TABLET(120 MG) BY MOUTH AT BEDTIME (Patient taking differently: Take 120 mg by mouth at bedtime. ) 90 tablet 3  . warfarin (COUMADIN) 5 MG tablet Take 0.5 tablets (2.5 mg total) by mouth daily at 6 PM. 60 tablet 1   No current facility-administered medications for this visit.     Allergies:   Ibuprofen    Social History:  The patient  reports that he quit smoking about 14 years ago. His smoking use included cigarettes and cigars. He quit after 3.00 years of use. He has never used smokeless tobacco. He reports that he does not use drugs.   Family History:  The patient's family history is not on file.  He indicated that his mother is deceased. He indicated that his father is deceased. He indicated that his maternal grandmother is deceased. He indicated that his maternal grandfather is deceased. He indicated that his paternal grandmother is deceased. He indicated that his paternal grandfather is deceased.   ROS:  Please see the history of present illness. All other systems are reviewed and negative.    PHYSICAL EXAM: VS:  BP 92/64   Pulse 71   Ht 5\' 11"  (1.803 m)   Wt 183 lb 6.4 oz (83.2 kg)   SpO2 94%   BMI 25.58 kg/m  , BMI Body mass index is 25.58 kg/m. GEN: Well nourished, well developed, male in no acute distress HEENT: normal for age  Neck: Mild JVD, no clear carotid bruit but his murmur radiates up into his carotids, no masses Cardiac: Irregular rate and rhythm; 2-3/6 murmur, no rubs, or gallops Respiratory: Rales bases bilaterally, normal work of breathing GI: soft, nontender, nondistended, + BS MS: no deformity or atrophy; 1+ lower extremity edema; distal pulses are 2+  in all 4 extremities  Skin: warm and dry, no rash Neuro:  Strength and sensation are intact Psych: euthymic mood, full affect   EKG:  EKG is not ordered today.   ECHO: 05/13/2018  - Left ventricle: The cavity size was normal. Wall thickness was   increased in a pattern of mild LVH. Systolic function was normal.   The estimated ejection fraction was in the range of 55% to 60%.   Wall motion was normal; there were no regional wall motion   abnormalities. -  Aortic valve: Valve mobility was restricted. There was moderate   stenosis. There was trivial regurgitation. Valve area (VTI): 1.16   cm^2. Valve area (Vmax): 1.03 cm^2. Valve area (Vmean): 1.07   cm^2. - Mitral valve: Calcified annulus. There was mild regurgitation. - Left atrium: The atrium was severely dilated. - Right atrium: The atrium was severely dilated. - Tricuspid valve: There was moderate regurgitation. - Pulmonary arteries: Systolic pressure was severely increased. PA   peak pressure: 67 mm Hg (S).  Impressions:  - Normal LV systolic function; mild LVH; calcified aortic valve   with moderate AS (mean gradient 27 mmHg) and trace AI; mild MR;   severe biatrial enlargement; moderate TR; severe pulmonary   hypertension.   CATH: 12/23/2003  VENTRICULOGRAPHY:  1. Ejection fraction approximately 70% without regional wall motion     abnormality.  2. No mitral regurgitation.   CORONARY ANGIOGRAPHY:  1. Left main:  Angiographically normal.  2. LAD:  Moderate size vessel giving rise to single diagonal.  It is     angiographically normal.  3. Circumflex:  Moderate size vessel giving rise to a single branching     obtuse marginal.  It is angiographically normal.  4. RCA:  Large dominant vessel.  It is angiographically normal.   IMPRESSION/RECOMMENDATIONS:  1. Angiographically normal coronary arteries.  2. Mild aortic stenosis.  3. No mitral stenosis or mitral regurgitation.  4. Severe pulmonary hypertension in  the setting of markedly elevated left     ventricular end-diastolic pressure.   The primary problem appears to be severe diastolic dysfunction with severe  pulmonary hypertension and consequent right ventricular failure.  We will  try to decrease LVEDP with increasing diuretic.  Recent Labs: 10/02/2017: ALT 9 05/13/2018: B Natriuretic Peptide 974.3; TSH 3.308 05/14/2018: Magnesium 2.0 05/15/2018: Hemoglobin 12.2; Platelets 184 05/16/2018: BUN 34; Creatinine, Ser 1.94; Potassium 5.1; Sodium 139  CBC    Component Value Date/Time   WBC 7.2 05/15/2018 0351   RBC 3.78 (L) 05/15/2018 0351   HGB 12.2 (L) 05/15/2018 0351   HCT 38.4 (L) 05/15/2018 0351   HCT 35.7 (L) 05/14/2018 0357   PLT 184 05/15/2018 0351   MCV 101.6 (H) 05/15/2018 0351   MCH 32.3 05/15/2018 0351   MCHC 31.8 05/15/2018 0351   RDW 16.7 (H) 05/15/2018 0351   LYMPHSABS 1.2 05/14/2018 0913   MONOABS 0.7 05/14/2018 0913   EOSABS 0.2 05/14/2018 0913   BASOSABS 0.1 05/14/2018 0913   CMP Latest Ref Rng & Units 05/16/2018 05/15/2018 05/14/2018  Glucose 70 - 99 mg/dL 123(H) 111(H) 116(H)  BUN 8 - 23 mg/dL 34(H) 29(H) 25(H)  Creatinine 0.61 - 1.24 mg/dL 1.94(H) 1.88(H) 1.73(H)  Sodium 135 - 145 mmol/L 139 140 141  Potassium 3.5 - 5.1 mmol/L 5.1 4.7 3.7  Chloride 98 - 111 mmol/L 106 104 105  CO2 22 - 32 mmol/L 26 28 26   Calcium 8.9 - 10.3 mg/dL 8.8(L) 8.8(L) 9.0  Total Protein 6.0 - 8.3 g/dL - - -  Total Bilirubin 0.2 - 1.2 mg/dL - - -  Alkaline Phos 39 - 117 U/L - - -  AST 0 - 37 U/L - - -  ALT 0 - 53 U/L - - -     Lipid Panel    Component Value Date/Time   CHOL 141 10/02/2017 1639   TRIG 71.0 10/02/2017 1639   HDL 55.00 10/02/2017 1639   CHOLHDL 3 10/02/2017 1639   VLDL 14.2 10/02/2017 1639   LDLCALC 71 10/02/2017 1639  Wt Readings from Last 3 Encounters:  06/03/18 183 lb 6.4 oz (83.2 kg)  05/16/18 173 lb 4.5 oz (78.6 kg)  05/13/18 178 lb (80.7 kg)     Other studies Reviewed: Additional studies/  records that were reviewed today include: Office notes, hospital records and testing.  ASSESSMENT AND PLAN:  1.  Chronic diastolic CHF: -[place] has been following his weight.  Over the last week, his weight range has been 176-181 pounds. -Today, his weight is 183 on our scales, there is no weight from [facility].  He was 179 pounds yesterday.  However, he is wearing heavy jeans, boots, a shirt and a sweatshirt on top of it. - The weights from [facility] are going to be very important to managing his volume status. -I think if it was weight stays between 176 and 180 pounds, that is acceptable. -His daughter needs to review the weights on a weekly basis and call our office if his weight is trending up. - I will check a BMET today. - I discussed with his daughter the balancing act between his volume status and his renal function with his aortic stenosis is a compounding factor. -I am concerned about the upward trend in his creatinine as well as his potassium, I will stop the eplerenone. -If he has had a significant increase in his creatinine, may have to stop the telmisartan as well. -Continue the torsemide which is at a high dose.  If he puts on fluid, discuss with Dr. Percival Spanish if it would be easier on his kidneys to give him metolazone, give him extra torsemide for a few days or just change him to Bumex.  2.  Permanent atrial fibrillation: He is completely asymptomatic with this.  No med changes  3.  Moderate aortic stenosis: I do not want to get him too dry, because I do not want him to get lightheaded and risk falling. - Follow this, I am not sure he is a candidate for TAVR  4.  CKD 3: Check a BMET today  5.  Social issues: His daughter is his POA and he is a DNR.   Current medicines are reviewed at length with the patient today.  The patient does not have concerns regarding medicines.  The following changes have been made: DC eplerenone  Labs/ tests ordered today include:    Orders Placed This Encounter  Procedures  . Basic metabolic panel     Disposition:   FU with Minus Breeding, MD  Signed, Rosaria Ferries, PA-C  06/03/2018 4:29 PM    [place]ila Doce Phone: [phone]; Fax: [phone]  This note was written with the assistance of speech recognition software.  Please excuse any transcriptional errors.

## 2018-06-04 LAB — BASIC METABOLIC PANEL
BUN/Creatinine Ratio: 31 — ABNORMAL HIGH (ref 10–24)
BUN: 59 mg/dL — ABNORMAL HIGH (ref 10–36)
CALCIUM: 9.2 mg/dL (ref 8.6–10.2)
CO2: 28 mmol/L (ref 20–29)
CREATININE: 1.93 mg/dL — AB (ref 0.76–1.27)
Chloride: 98 mmol/L (ref 96–106)
GFR calc Af Amer: 34 mL/min/{1.73_m2} — ABNORMAL LOW (ref >59)
GFR, EST NON AFRICAN AMERICAN: 30 mL/min/{1.73_m2} — AB (ref >59)
Glucose: 121 mg/dL — ABNORMAL HIGH (ref 65–99)
Potassium: 4.4 mmol/L (ref 3.5–5.2)
Sodium: 140 mmol/L (ref 134–144)

## 2018-06-05 ENCOUNTER — Other Ambulatory Visit: Payer: Self-pay

## 2018-06-05 ENCOUNTER — Encounter (HOSPITAL_COMMUNITY): Payer: Self-pay | Admitting: *Deleted

## 2018-06-05 ENCOUNTER — Observation Stay (HOSPITAL_COMMUNITY)
Admission: EM | Admit: 2018-06-05 | Discharge: 2018-06-11 | Disposition: A | Discharge status: Home / Self Care | Payer: Medicare Other | Attending: Family Medicine | Admitting: Family Medicine

## 2018-06-05 ENCOUNTER — Emergency Department (HOSPITAL_COMMUNITY): Payer: Medicare Other

## 2018-06-05 DIAGNOSIS — I491 Atrial premature depolarization: Secondary | ICD-10-CM | POA: Insufficient documentation

## 2018-06-05 DIAGNOSIS — I350 Nonrheumatic aortic (valve) stenosis: Secondary | ICD-10-CM | POA: Diagnosis not present

## 2018-06-05 DIAGNOSIS — R509 Fever, unspecified: Secondary | ICD-10-CM | POA: Insufficient documentation

## 2018-06-05 DIAGNOSIS — N400 Enlarged prostate without lower urinary tract symptoms: Secondary | ICD-10-CM | POA: Insufficient documentation

## 2018-06-05 DIAGNOSIS — R590 Localized enlarged lymph nodes: Secondary | ICD-10-CM | POA: Insufficient documentation

## 2018-06-05 DIAGNOSIS — Z66 Do not resuscitate: Secondary | ICD-10-CM | POA: Diagnosis not present

## 2018-06-05 DIAGNOSIS — E875 Hyperkalemia: Secondary | ICD-10-CM | POA: Diagnosis not present

## 2018-06-05 DIAGNOSIS — I5033 Acute on chronic diastolic (congestive) heart failure: Secondary | ICD-10-CM | POA: Diagnosis not present

## 2018-06-05 DIAGNOSIS — R011 Cardiac murmur, unspecified: Secondary | ICD-10-CM | POA: Insufficient documentation

## 2018-06-05 DIAGNOSIS — I2510 Atherosclerotic heart disease of native coronary artery without angina pectoris: Secondary | ICD-10-CM | POA: Insufficient documentation

## 2018-06-05 DIAGNOSIS — N184 Chronic kidney disease, stage 4 (severe): Secondary | ICD-10-CM | POA: Diagnosis not present

## 2018-06-05 DIAGNOSIS — I5084 End stage heart failure: Secondary | ICD-10-CM | POA: Diagnosis not present

## 2018-06-05 DIAGNOSIS — I700 Atherosclerosis of aorta: Secondary | ICD-10-CM | POA: Diagnosis not present

## 2018-06-05 DIAGNOSIS — M1990 Unspecified osteoarthritis, unspecified site: Secondary | ICD-10-CM | POA: Insufficient documentation

## 2018-06-05 DIAGNOSIS — I959 Hypotension, unspecified: Secondary | ICD-10-CM | POA: Diagnosis not present

## 2018-06-05 DIAGNOSIS — Z515 Encounter for palliative care: Secondary | ICD-10-CM | POA: Diagnosis not present

## 2018-06-05 DIAGNOSIS — N179 Acute kidney failure, unspecified: Secondary | ICD-10-CM | POA: Diagnosis not present

## 2018-06-05 DIAGNOSIS — Z8673 Personal history of transient ischemic attack (TIA), and cerebral infarction without residual deficits: Secondary | ICD-10-CM | POA: Insufficient documentation

## 2018-06-05 DIAGNOSIS — R531 Weakness: Secondary | ICD-10-CM | POA: Insufficient documentation

## 2018-06-05 DIAGNOSIS — I509 Heart failure, unspecified: Secondary | ICD-10-CM

## 2018-06-05 DIAGNOSIS — M549 Dorsalgia, unspecified: Secondary | ICD-10-CM | POA: Insufficient documentation

## 2018-06-05 DIAGNOSIS — H409 Unspecified glaucoma: Secondary | ICD-10-CM | POA: Insufficient documentation

## 2018-06-05 DIAGNOSIS — Z8249 Family history of ischemic heart disease and other diseases of the circulatory system: Secondary | ICD-10-CM | POA: Insufficient documentation

## 2018-06-05 DIAGNOSIS — F17290 Nicotine dependence, other tobacco product, uncomplicated: Secondary | ICD-10-CM | POA: Insufficient documentation

## 2018-06-05 DIAGNOSIS — I480 Paroxysmal atrial fibrillation: Secondary | ICD-10-CM | POA: Insufficient documentation

## 2018-06-05 DIAGNOSIS — M25511 Pain in right shoulder: Secondary | ICD-10-CM | POA: Insufficient documentation

## 2018-06-05 DIAGNOSIS — I130 Hypertensive heart and chronic kidney disease with heart failure and stage 1 through stage 4 chronic kidney disease, or unspecified chronic kidney disease: Secondary | ICD-10-CM | POA: Diagnosis not present

## 2018-06-05 DIAGNOSIS — R05 Cough: Secondary | ICD-10-CM | POA: Insufficient documentation

## 2018-06-05 DIAGNOSIS — Z886 Allergy status to analgesic agent status: Secondary | ICD-10-CM | POA: Insufficient documentation

## 2018-06-05 DIAGNOSIS — R269 Unspecified abnormalities of gait and mobility: Secondary | ICD-10-CM | POA: Insufficient documentation

## 2018-06-05 DIAGNOSIS — N62 Hypertrophy of breast: Secondary | ICD-10-CM | POA: Insufficient documentation

## 2018-06-05 DIAGNOSIS — R001 Bradycardia, unspecified: Secondary | ICD-10-CM | POA: Diagnosis present

## 2018-06-05 DIAGNOSIS — I272 Pulmonary hypertension, unspecified: Secondary | ICD-10-CM | POA: Diagnosis present

## 2018-06-05 DIAGNOSIS — I4819 Other persistent atrial fibrillation: Secondary | ICD-10-CM

## 2018-06-05 DIAGNOSIS — N183 Chronic kidney disease, stage 3 unspecified: Secondary | ICD-10-CM | POA: Diagnosis present

## 2018-06-05 DIAGNOSIS — J90 Pleural effusion, not elsewhere classified: Secondary | ICD-10-CM | POA: Insufficient documentation

## 2018-06-05 DIAGNOSIS — E7800 Pure hypercholesterolemia, unspecified: Secondary | ICD-10-CM | POA: Diagnosis not present

## 2018-06-05 DIAGNOSIS — F17210 Nicotine dependence, cigarettes, uncomplicated: Secondary | ICD-10-CM | POA: Diagnosis not present

## 2018-06-05 DIAGNOSIS — I442 Atrioventricular block, complete: Secondary | ICD-10-CM | POA: Insufficient documentation

## 2018-06-05 DIAGNOSIS — R579 Shock, unspecified: Secondary | ICD-10-CM

## 2018-06-05 DIAGNOSIS — Z79899 Other long term (current) drug therapy: Secondary | ICD-10-CM | POA: Insufficient documentation

## 2018-06-05 DIAGNOSIS — R1013 Epigastric pain: Secondary | ICD-10-CM | POA: Insufficient documentation

## 2018-06-05 DIAGNOSIS — I359 Nonrheumatic aortic valve disorder, unspecified: Secondary | ICD-10-CM | POA: Diagnosis present

## 2018-06-05 DIAGNOSIS — J9621 Acute and chronic respiratory failure with hypoxia: Secondary | ICD-10-CM | POA: Insufficient documentation

## 2018-06-05 DIAGNOSIS — R739 Hyperglycemia, unspecified: Secondary | ICD-10-CM | POA: Insufficient documentation

## 2018-06-05 DIAGNOSIS — M546 Pain in thoracic spine: Secondary | ICD-10-CM | POA: Insufficient documentation

## 2018-06-05 DIAGNOSIS — Z7901 Long term (current) use of anticoagulants: Secondary | ICD-10-CM | POA: Insufficient documentation

## 2018-06-05 DIAGNOSIS — R079 Chest pain, unspecified: Secondary | ICD-10-CM | POA: Insufficient documentation

## 2018-06-05 DIAGNOSIS — R42 Dizziness and giddiness: Secondary | ICD-10-CM | POA: Insufficient documentation

## 2018-06-05 DIAGNOSIS — M109 Gout, unspecified: Secondary | ICD-10-CM | POA: Insufficient documentation

## 2018-06-05 DIAGNOSIS — I358 Other nonrheumatic aortic valve disorders: Secondary | ICD-10-CM | POA: Insufficient documentation

## 2018-06-05 DIAGNOSIS — Z9981 Dependence on supplemental oxygen: Secondary | ICD-10-CM | POA: Insufficient documentation

## 2018-06-05 DIAGNOSIS — M7989 Other specified soft tissue disorders: Secondary | ICD-10-CM | POA: Insufficient documentation

## 2018-06-05 DIAGNOSIS — I4821 Permanent atrial fibrillation: Secondary | ICD-10-CM | POA: Insufficient documentation

## 2018-06-05 HISTORY — DX: Chronic kidney disease, stage 3 (moderate): N18.3

## 2018-06-05 HISTORY — DX: Chronic kidney disease, stage 3 unspecified: N18.30

## 2018-06-05 HISTORY — DX: Nonrheumatic aortic valve disorder, unspecified: I35.9

## 2018-06-05 LAB — I-STAT CHEM 8, ED
BUN: 61 mg/dL — ABNORMAL HIGH (ref 8–23)
CALCIUM ION: 1.09 mmol/L — AB (ref 1.15–1.40)
Chloride: 104 mmol/L (ref 98–111)
Creatinine, Ser: 2.2 mg/dL — ABNORMAL HIGH (ref 0.61–1.24)
Glucose, Bld: 140 mg/dL — ABNORMAL HIGH (ref 70–99)
HEMATOCRIT: 36 % — AB (ref 39.0–52.0)
Hemoglobin: 12.2 g/dL — ABNORMAL LOW (ref 13.0–17.0)
POTASSIUM: 4.4 mmol/L (ref 3.5–5.1)
SODIUM: 141 mmol/L (ref 135–145)
TCO2: 28 mmol/L (ref 22–32)

## 2018-06-05 LAB — CBC
HEMATOCRIT: 36.2 % — AB (ref 39.0–52.0)
Hemoglobin: 10.7 g/dL — ABNORMAL LOW (ref 13.0–17.0)
MCH: 30.3 pg (ref 26.0–34.0)
MCHC: 29.6 g/dL — ABNORMAL LOW (ref 30.0–36.0)
MCV: 102.5 fL — AB (ref 80.0–100.0)
NRBC: 0 % (ref 0.0–0.2)
Platelets: 195 10*3/uL (ref 150–400)
RBC: 3.53 MIL/uL — ABNORMAL LOW (ref 4.22–5.81)
RDW: 16.1 % — ABNORMAL HIGH (ref 11.5–15.5)
WBC: 5.9 10*3/uL (ref 4.0–10.5)

## 2018-06-05 LAB — PROCALCITONIN

## 2018-06-05 LAB — BASIC METABOLIC PANEL
ANION GAP: 9 (ref 5–15)
BUN: 59 mg/dL — ABNORMAL HIGH (ref 8–23)
CHLORIDE: 106 mmol/L (ref 98–111)
CO2: 24 mmol/L (ref 22–32)
Calcium: 8.1 mg/dL — ABNORMAL LOW (ref 8.9–10.3)
Creatinine, Ser: 1.97 mg/dL — ABNORMAL HIGH (ref 0.61–1.24)
GFR calc non Af Amer: 28 mL/min — ABNORMAL LOW (ref >60)
GFR, EST AFRICAN AMERICAN: 33 mL/min — AB (ref >60)
Glucose, Bld: 142 mg/dL — ABNORMAL HIGH (ref 70–99)
Potassium: 4.5 mmol/L (ref 3.5–5.1)
Sodium: 139 mmol/L (ref 135–145)

## 2018-06-05 LAB — I-STAT TROPONIN, ED: Troponin i, poc: 0.04 ng/mL (ref 0.00–0.08)

## 2018-06-05 LAB — CBG MONITORING, ED: GLUCOSE-CAPILLARY: 128 mg/dL — AB (ref 70–99)

## 2018-06-05 LAB — HEPATIC FUNCTION PANEL
ALT: 16 U/L (ref 0–44)
AST: 23 U/L (ref 15–41)
Albumin: 3.2 g/dL — ABNORMAL LOW (ref 3.5–5.0)
Alkaline Phosphatase: 129 U/L — ABNORMAL HIGH (ref 38–126)
BILIRUBIN DIRECT: 0.2 mg/dL (ref 0.0–0.2)
Indirect Bilirubin: 0.5 mg/dL (ref 0.3–0.9)
TOTAL PROTEIN: 6.4 g/dL — AB (ref 6.5–8.1)
Total Bilirubin: 0.7 mg/dL (ref 0.3–1.2)

## 2018-06-05 LAB — LACTIC ACID, PLASMA: LACTIC ACID, VENOUS: 1.1 mmol/L (ref 0.5–1.9)

## 2018-06-05 LAB — TROPONIN I: Troponin I: 0.03 ng/mL (ref <0.03)

## 2018-06-05 LAB — PROTIME-INR
INR: 1.83
Prothrombin Time: 21 seconds — ABNORMAL HIGH (ref 11.4–15.2)

## 2018-06-05 LAB — BRAIN NATRIURETIC PEPTIDE: B NATRIURETIC PEPTIDE 5: 619.8 pg/mL — AB (ref 0.0–100.0)

## 2018-06-05 MED ORDER — ATROPINE SULFATE 1 MG/ML IJ SOLN
0.5000 mg | Freq: Once | INTRAMUSCULAR | Status: AC
  Administered 2018-06-05: 0.5 mg via INTRAVENOUS

## 2018-06-05 MED ORDER — SENNA 8.6 MG PO TABS: 1.0000 | ORAL_TABLET | Freq: Every evening | ORAL | Status: DC | PRN

## 2018-06-05 MED ORDER — ATROPINE SULFATE 1 MG/10ML IJ SOSY
PREFILLED_SYRINGE | INTRAMUSCULAR | Status: AC
  Filled 2018-06-05: qty 10

## 2018-06-05 MED ORDER — ONDANSETRON HCL 4 MG/2ML IJ SOLN: 4.0000 mg | Freq: Four times a day (QID) | INTRAMUSCULAR | Status: DC | PRN

## 2018-06-05 MED ORDER — VANCOMYCIN HCL 10 G IV SOLR
1750.0000 mg | Freq: Once | INTRAVENOUS | Status: DC
  Filled 2018-06-05: qty 1750

## 2018-06-05 MED ORDER — VANCOMYCIN HCL IN DEXTROSE 1-5 GM/200ML-% IV SOLN: 1000.0000 mg | INTRAVENOUS | Status: DC

## 2018-06-05 MED ORDER — DOPAMINE-DEXTROSE 3.2-5 MG/ML-% IV SOLN
0.0000 ug/kg/min | Freq: Once | INTRAVENOUS | Status: AC
  Administered 2018-06-05: 5 ug/kg/min via INTRAVENOUS
  Filled 2018-06-05: qty 250

## 2018-06-05 MED ORDER — NOREPINEPHRINE 4 MG/250ML-% IV SOLN: 0.0000 ug/min | INTRAVENOUS | Status: DC

## 2018-06-05 MED ORDER — ONDANSETRON 4 MG PO TBDP: 4.0000 mg | ORAL_TABLET | Freq: Four times a day (QID) | ORAL | Status: DC | PRN

## 2018-06-05 MED ORDER — SODIUM CHLORIDE 0.9 % IV BOLUS
500.0000 mL | Freq: Once | INTRAVENOUS | Status: AC
  Administered 2018-06-05: 500 mL via INTRAVENOUS

## 2018-06-05 MED ORDER — MORPHINE SULFATE (PF) 2 MG/ML IV SOLN: 2.0000 mg | Freq: Once | INTRAVENOUS | Status: DC | PRN

## 2018-06-05 MED ORDER — POLYVINYL ALCOHOL 1.4 % OP SOLN
1.0000 [drp] | Freq: Four times a day (QID) | OPHTHALMIC | Status: DC | PRN
  Administered 2018-06-07 – 2018-06-11 (×3): 1 [drp] via OPHTHALMIC
  Filled 2018-06-05: qty 15

## 2018-06-05 MED ORDER — OXYCODONE HCL 5 MG PO TABS
5.0000 mg | ORAL_TABLET | ORAL | Status: DC | PRN
  Administered 2018-06-07 – 2018-06-10 (×2): 5 mg via ORAL
  Filled 2018-06-05 (×2): qty 1

## 2018-06-05 MED ORDER — ALUM & MAG HYDROXIDE-SIMETH 200-200-20 MG/5ML PO SUSP: 30.0000 mL | Freq: Four times a day (QID) | ORAL | Status: DC | PRN

## 2018-06-05 MED ORDER — SODIUM CHLORIDE 0.9 % IV SOLN
Freq: Once | INTRAVENOUS | Status: AC
  Administered 2018-06-05: 16:00:00 via INTRAVENOUS

## 2018-06-05 MED ORDER — ACETAMINOPHEN 325 MG PO TABS
650.0000 mg | ORAL_TABLET | Freq: Once | ORAL | Status: AC
  Administered 2018-06-05: 650 mg via ORAL
  Filled 2018-06-05: qty 2

## 2018-06-05 MED ORDER — SODIUM CHLORIDE 0.9 % IV SOLN
2.0000 g | Freq: Once | INTRAVENOUS | Status: AC
  Administered 2018-06-05: 2 g via INTRAVENOUS
  Filled 2018-06-05: qty 2

## 2018-06-05 MED ORDER — ACETAMINOPHEN 650 MG RE SUPP: 650.0000 mg | Freq: Four times a day (QID) | RECTAL | Status: DC | PRN

## 2018-06-05 MED ORDER — NYSTATIN 100000 UNIT/GM EX POWD
Freq: Three times a day (TID) | CUTANEOUS | Status: DC | PRN
  Filled 2018-06-05: qty 15

## 2018-06-05 MED ORDER — SODIUM CHLORIDE 0.9 % IV SOLN: 1.0000 g | INTRAVENOUS | Status: DC

## 2018-06-05 MED ORDER — ACETAMINOPHEN 325 MG PO TABS
650.0000 mg | ORAL_TABLET | Freq: Four times a day (QID) | ORAL | Status: DC | PRN
  Administered 2018-06-06 – 2018-06-10 (×6): 650 mg via ORAL
  Filled 2018-06-05 (×7): qty 2

## 2018-06-05 MED ORDER — LORAZEPAM 2 MG/ML IJ SOLN: 1.0000 mg | Freq: Once | INTRAMUSCULAR | Status: DC | PRN

## 2018-06-05 MED ORDER — MAGIC MOUTHWASH: 15.0000 mL | Freq: Four times a day (QID) | ORAL | Status: DC | PRN

## 2018-06-05 MED ORDER — HYDROMORPHONE HCL 1 MG/ML IJ SOLN: 0.5000 mg | INTRAMUSCULAR | Status: DC | PRN

## 2018-06-05 MED ORDER — BIOTENE DRY MOUTH MT LIQD: 15.0000 mL | OROMUCOSAL | Status: DC | PRN

## 2018-06-05 NOTE — Progress Notes (Signed)
[name] [name] is a 84 y.o. male patient admitted from ED awake, alert - oriented  X 4 - no acute distress noted.  VSS - Blood pressure (!) 105/59, pulse 61, temperature (!) 97.5 F (36.4 C), temperature source Oral, resp. rate 18, height 5\' 7"  (1.702 m), weight 85.6 kg, SpO2 94 %.    IV in place, occlusive dsg intact without redness.   Will cont to eval and treat per MD orders.  Vidal Schwalbe, RN 06/05/2018 9:20 PM

## 2018-06-05 NOTE — ED Notes (Signed)
Pt refuses condom catheter at this time. Will continue to attempt with urinal for urine

## 2018-06-05 NOTE — ED Notes (Addendum)
Internal medicine MD at bedside discussing care with family

## 2018-06-05 NOTE — ED Triage Notes (Signed)
Per ems they were called because patient wasn't feeling well, up their arrival patient was alert oriented , found patient to be in 3rd degree heart block. Upon arrival to ed alert oriented denies chest pain c/o pain under right scapula, denies injury or sob.

## 2018-06-05 NOTE — ED Notes (Signed)
ED Provider at bedside with internal medicine and cardiology

## 2018-06-05 NOTE — ED Notes (Signed)
CCM Provider at bedside. Will discontinue IV medications and antibiotics  once family has been called.

## 2018-06-05 NOTE — ED Notes (Signed)
Patient placed on zoll pads on arrival.

## 2018-06-05 NOTE — Consult Note (Signed)
NAME:  [name] [name], MRN:  [id number], DOB:  1926-03-28, LOS: 0 ADMISSION DATE:  06/05/2018, CONSULTATION DATE:  06/05/2018 REFERRING MD:  Dr. , CHIEF COMPLAINT:  Bradycardia/ hypotension  Brief History   84 yoM DNR/ DNI presenting with acute onset of not feeling well, found bradycardic (slow Afib) with hypotension not responsive to 1.5L NS.  Started on dopamine in ER.  Cards consulted.  Empirically started on abx. PCCM called for admit.  Past Medical History  Chronic hypoxic respiratory failure on home O2 2-3L, HFpEF, pulmonary hypertension, severe AS, chronic Afib on coumadin, CKD3-4, HTN, BPH, glacucoma  Significant Hospital Events   10/30 presented   Consults: date of consult/date signed off & final recs:  Cards 10/30  Procedures (surgical and bedside):  none  Significant Diagnostic Tests:   Micro Data:  canceled  Antimicrobials:  10/30 Cefepime >> 10/30  Subjective:  Patient states his R scapula pain is gone, denies SOB. Dopamine at 15 mcg/ kg/ min  Objective   Blood pressure 91/64, pulse 92, temperature 97.6 F (36.4 C), temperature source Rectal, resp. rate 17, height 5\' 11"  (1.803 m), weight 83 kg, SpO2 94 %.        Intake/Output Summary (Last 24 hours) at 06/05/2018 1727 Last data filed at 06/05/2018 1603 Gross per 24 hour  Intake 1004.77 ml  Output -  Net 1004.77 ml   Filed Weights   06/05/18 1447  Weight: 83 kg    Examination: General:  84 elderly male lying in bed in NAD lying in bed in NAD HEENT: MM pink/dry, pupils 2/reactive, arcus senilis,  Mild JVD Neuro: Slower to respond but alert, oriented x 3, MAE- generalized weakness CV: IRIR 85, +1 distal pulses, +systolic murmur PULM: even/non-labored, lungs bilaterally clear, diminished in bases GI: soft, non-tender, bs active  Extremities: cool/dry, +2 BLE edema, bilateral venous stasis Skin: no rashes, dry skin  Resolved Hospital Problem list    Assessment & Plan:  Bradycardia (slow Afib) with  hypotension AKI on CKD - no clear evidence of ACS on EKG/ initial trop neg - has been afebrile/ normal WBC, no evidence of overt infiltrate on CXR  - unclear etiology of shock at this time, likely cardiogenic given cardiac hx and acute onset of symptoms P:  After extensive talking with patient and daughter at bedside, they wish to proceed with comfort care only.  D/c labs/ further test Will d/c dopamine in ER.   PRN dose of morphine or ativan added for comfort, pain, or anxiety   Disposition / Summary of Today's Plan 06/05/18   As above.  ER to arrange admission for palliation.     Labs   CBC: Recent Labs  Lab 06/05/18 1439 06/05/18 1525  WBC 5.9  --   HGB 10.7* 12.2*  HCT 36.2* 36.0*  MCV 102.5*  --   PLT 195  --     Basic Metabolic Panel: Recent Labs  Lab 06/03/18 1409 06/05/18 1439 06/05/18 1525  NA 140 139 141  K 4.4 4.5 4.4  CL 98 106 104  CO2 28 24  --   GLUCOSE 121* 142* 140*  BUN 59* 59* 61*  CREATININE 1.93* 1.97* 2.20*  CALCIUM 9.2 8.1*  --    GFR: Estimated Creatinine Clearance: 23.8 mL/min (A) (by C-G formula based on SCr of 2.2 mg/dL (H)). Recent Labs  Lab 06/05/18 1439  WBC 5.9    Liver Function Tests: No results for input(s): AST, ALT, ALKPHOS, BILITOT, PROT, ALBUMIN in the last 168 hours. No results for  input(s): LIPASE, AMYLASE in the last 168 hours. No results for input(s): AMMONIA in the last 168 hours.  ABG    Component Value Date/Time   HCO3 40.0 (H) 03/29/2007 1606   TCO2 28 06/05/2018 1525     Coagulation Profile: Recent Labs  Lab 06/05/18 1624  INR 1.83    Cardiac Enzymes: No results for input(s): CKTOTAL, CKMB, CKMBINDEX, TROPONINI in the last 168 hours.  HbA1C: Hgb A1c MFr Bld  Date/Time Value Ref Range Status  10/02/2017 04:39 PM 6.5 4.6 - 6.5 % Final    Comment:    Glycemic Control Guidelines for People with Diabetes:Non Diabetic:  <6%Goal of Therapy: <7%Additional Action Suggested:  >8%   03/22/2017 01:52 PM  6.1 4.6 - 6.5 % Final    Comment:    Glycemic Control Guidelines for People with Diabetes:Non Diabetic:  <6%Goal of Therapy: <7%Additional Action Suggested:  >8%     CBG: Recent Labs  Lab 06/05/18 1511  GLUCAP 128*    Admitting History of Present Illness.   84 year old male with PMH significant for but not limited to chronic hypoxic respiratory failure on home O2 2-3L, HFpEF, pulmonary hypertension, severe AS, chronic Afib on coumadin, CKD3-4, HTN, and BPH presenting for acute onset of not feeling well with associated bradycardia and hypotension.  Of note patient recently admitted 10/7 to 10/10 for worsening dyspnea and diuresis for chronic diastolic heart failure.  Patient has been at the [facility] place for the last 3 weeks and medications dispensed to patient.  Daughter at bedside, states he was in his normal state of health this morning.  Denies infectious symptoms.  Acute onset of symptoms this afternoon.  Found by EMS with heart rate in the 30's found to be slow Afib on EKG without ST abnormality and hypotension with no improvement after 1.5 L of NS.  Labs noted for normal WBC, sCr 2.2, neg troponin, Hgb 10.7, INR 1.83, CXR showing bibasilar atelectasis and small bilateral effusions.   Patient is a DNR/ DNI but patient and daughter were ok with medications only, no procedures.  Of note, patient had complained of right scapular pain with radiation around chest in ER that resolved with tylenol.  Cardiology consulted in ER, with no indications for ACS and recommended medical management only and further [place] discussions.  Patient was started on dopamine and PCCM called for admission.   Review of Systems:   POSITIVES IN BOLD Gen: Denies fever, chills, weight change, fatigue PULM: Denies shortness of breath, cough, sputum production, hemoptysis, wheezing CV: Denies right chest pain, edema, orthopnea, palpitations GI: Denies abdominal pain, nausea, vomiting, diarrhea, change in bowel habits GU:  Denies dysuria, hematuria Endocrine: Denies hot or cold intolerance, polyuria, polyphagia or appetite change Derm: Denies rash or skin change Heme: Denies easy bruising Neuro: Denies headache,  generalized weakness, slurred speech, loss of memory or consciousness  Past Medical History  He,  has a past medical history of 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   Surgical History    Past Surgical History:  Procedure Laterality Date  . APPENDECTOMY    . CATARACT EXTRACTION    . cosmetic eye surgery    . INGUINAL HERNIA REPAIR  Social History   Social History   Socioeconomic History  . Marital status: Married    Spouse name: Not on file  . Number of children: 5  . Years of education: 10  . Highest education level: Not on file  Occupational History  . Occupation: Retired  Scientific laboratory technician  . Financial resource strain: Not on file  . Food insecurity:    Worry: Not on file    Inability: Not on file  . Transportation needs:    Medical: Not on file    Non-medical: Not on file  Tobacco Use  . Smoking status: Former Smoker    Years: 3.00    Types: Cigarettes, Cigars    Last attempt to quit: 12/05/2003    Years since quitting: 14.5  . Smokeless tobacco: Never Used  . Tobacco comment: does not smoke cigars anymore  Substance and Sexual Activity  . Alcohol use: Not on file    Comment: Very rarely  . Drug use: No  . Sexual activity: Not on file  Lifestyle  . Physical activity:    Days per week: Not on file     Minutes per session: Not on file  . Stress: Not on file  Relationships  . Social connections:    Talks on phone: Not on file    Gets together: Not on file    Attends religious service: Not on file    Active member of club or organization: Not on file    Attends meetings of clubs or organizations: Not on file    Relationship status: Not on file  . Intimate partner violence:    Fear of current or ex partner: Not on file    Emotionally abused: Not on file    Physically abused: Not on file    Forced sexual activity: Not on file  Other Topics Concern  . Not on file  Social History Narrative   Fun: Stay home; sitting on the porch   Denies religious beliefs effecting health care  ,  reports that he quit smoking about 14 years ago. His smoking use included cigarettes and cigars. He quit after 3.00 years of use. He has never used smokeless tobacco. He reports that he does not use drugs.   Family History   His family history is not on file.   Allergies Allergies  Allergen Reactions  . Ibuprofen     REACTION: causes low blood pressure     Home Medications  Prior to Admission medications   Medication Sig Start Date End Date Taking? Authorizing Provider  allopurinol (ZYLOPRIM) 100 MG tablet TAKE 1 TABLET(100 MG) BY MOUTH DAILY. Patient taking differently: Take 100 mg by mouth daily.  02/26/18  Yes Biagio Borg, MD  atorvastatin (LIPITOR) 20 MG tablet Take 1 tablet (20 mg total) by mouth daily. Patient taking differently: Take 20 mg by mouth at bedtime.  10/09/17  Yes Hochrein, Jeneen Rinks, MD  bimatoprost (LUMIGAN) 0.01 % SOLN Place 1 drop into both eyes at bedtime.   Yes [provider]  brinzolamide (AZOPT) 1 % ophthalmic suspension Place 1 drop into both eyes 2 (two) times daily.     Yes [provider]  Calcifediol ER (RAYALDEE) 30 MCG CPCR Take 30 mcg by mouth at bedtime.    Yes [provider]  metoprolol succinate (TOPROL-XL) 100 MG 24 hr tablet TAKE 1 TABLET BY  MOUTH DAILY WITH OR IMMEDIATELY FOLLOWING A MEAL Patient taking differently: Take 100 mg by mouth daily.  09/04/17  Yes Minus Breeding, MD  Multiple Vitamin (MULTIVITAMIN) tablet Take 1 tablet by mouth daily. **Adult One A Day**   Yes [provider]  omeprazole (PRILOSEC) 20 MG capsule TAKE 1 CAPSULE(20 MG) BY MOUTH DAILY Patient taking differently: Take 20 mg by mouth daily as needed (reflux).  05/10/18  Yes Fenton Foy, NP  pilocarpine (PILOCAR) 4 % ophthalmic solution Place 1 drop into the left eye 2 (two) times daily.    Yes [provider]  potassium chloride SA (K-DUR,KLOR-CON) 20 MEQ tablet Take 1 tablet (20 mEq total) by mouth 2 (two) times daily. 05/17/18  Yes Danford, Suann Larry, MD  tamsulosin (FLOMAX) 0.4 MG CAPS capsule Take 1 capsule (0.4 mg total) by mouth 2 (two) times daily. Patient taking differently: Take 0.4 mg by mouth at bedtime.  05/06/18  Yes Biagio Borg, MD  telmisartan (MICARDIS) 80 MG tablet Take 1 tablet (80 mg total) by mouth daily. Patient taking differently: Take 40 mg by mouth daily at 12 noon.  10/01/17  Yes Minus Breeding, MD  torsemide (DEMADEX) 20 MG tablet Take 4 tablets (80 mg total) by mouth daily before breakfast. 05/17/18  Yes Danford, Suann Larry, MD  torsemide (DEMADEX) 20 MG tablet Take 2 tablets (40 mg total) by mouth daily at 6 PM. 05/16/18  Yes Danford, Suann Larry, MD  torsemide (DEMADEX) 20 MG tablet Take 40 mg by mouth daily at 6 PM.   Yes [provider]  verapamil (CALAN-SR) 120 MG CR tablet TAKE 1 TABLET(120 MG) BY MOUTH AT BEDTIME Patient taking differently: Take 120 mg by mouth at bedtime.  04/29/18  Yes Minus Breeding, MD  warfarin (COUMADIN) 1 MG tablet Take 0.5 mg by mouth daily. Take with 3mg  to equal 3.5mg    Yes [provider]  warfarin (COUMADIN) 3 MG tablet Take 3 mg by mouth daily. Take with 0.5mg  to equal 3.5mg .   Yes [provider]  warfarin (COUMADIN) 5 MG tablet Take 0.5  tablets (2.5 mg total) by mouth daily at 6 PM. Patient not taking: Reported on 06/05/2018 05/17/18   Edwin Dada, MD     Critical care time: 30 mins    Kennieth Rad, AGACNP-BC [place] Pgr: [phone] or if no answer [phone] 06/05/2018, 5:54 PM

## 2018-06-05 NOTE — ED Notes (Signed)
Pt checked. Asked if he needed urinal. No urine at this time.

## 2018-06-05 NOTE — ED Notes (Signed)
Phlebotomy at bedside for cultures 

## 2018-06-05 NOTE — Consult Note (Addendum)
Cardiology Consultation:   Patient ID: [name] [name] MRN: [id number]; DOB: 12-Jul-1926  Admit date: 06/05/2018 Date of Consult: 06/05/2018  Primary Care Provider: Biagio Borg, MD Primary Cardiologist: Minus Breeding, MD  Primary Electrophysiologist:  None    Patient Profile:   [name] [name] is a 84 y.o. male with a hx of HFpEF with EF 55% 05/2018, permanent atrial fibrillation on Coumadin, moderate aortic stenosis not a candidate for TAVR, and chronic hypoxic respiratory failure on 2.5 L of supplemental O2 who is being seen today for the evaluation of bradycardia at the request of [place].  History of Present Illness:   [name] [name] was recently admitted 10/07-10/10 for progressive worsening of shortness of breath for 3 weeks.  He was diuresed with IV Lasix with minimal improvement in respiratory status and was switched from Lasix to torsemide. He was also treated for a community-acquired pneumonia with Augmentin for 7 days.  He was discharged to [facility] place where he has been for 3 weeks.  His daughter who is his [facility] POA reports he has been doing well since being discharged.  She had breakfast with patient this morning and he was feeling well.  Later in the afternoon he was working with physical therapy and his blood pressure dropped suddenly and he became bradycardic. He was taken to the ED emergently for further evaluation. Per triage note, he was in complete heart block. However, his initial EKG shows slow atrial fibrillation. During our encounter he appeared tired and was able to provide little history. He denies chest pain and shortness of breath.  He does report right shoulder pain seems to be radiating to his right chest. Initial troponin was negative. He also reports one episode of dizziness 3 days ago.  He denies recent illness, fever, chills, shortness of breath, and cough.  He ambulates with a walker at baseline and states he is usually able to walk [phone] ft.    On  arrival to the ED his vital signs were as follow: HR 30-40s, BP 60-70s/50s T 97.5 and O2 94% on 3L. He was given atropine and started on a dopamine drip with improvement in HR to 60s.  He also received 1.5L IVF with no improvement in blood pressure. He received a dose of vancomycin and cefepime prior to collection of blood cultures.   He was seen in the cardiology clinic on 10/28.  At that time his eplerenone was stopped due to worsening renal function and hyperkalemia. His torsemide was continued at 80AM/40PM as they have been trying to maintain optimal volume status in the setting of moderate aortic stenosis. [place] has been following his weight on a daily basis which has ranged 176-181 lbs. He takes metoprolol and verapamil.  Unclear if he has been taking this at SNF, but no recent to suspect he has not.   Past Medical History:  Diagnosis Date  . 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 Aortic stenosis   . Atrial fibrillation ([place])   . BPH (benign prostatic hypertrophy)   . Congestive heart failure, unspecified   . CVA (cerebral infarction)   . DJD (degenerative joint disease)   . Generalized osteoarthrosis, unspecified site   . Gout, unspecified   . HTN (hypertension), benign   . Pulmonary hypertension ([place])   . Pure hypercholesterolemia   . Renal insufficiency   . Unspecified cerebral artery occlusion with cerebral infarction   . Unspecified hemorrhoids without mention of complication     Past Surgical History:  Procedure Laterality Date  . APPENDECTOMY    . CATARACT EXTRACTION    . cosmetic eye surgery    . INGUINAL HERNIA REPAIR       Home Medications:  Prior to Admission medications   Medication Sig Start Date End Date Taking? Authorizing Provider  allopurinol (ZYLOPRIM) 100 MG tablet TAKE 1 TABLET(100 MG) BY MOUTH DAILY. Patient taking differently: Take 100 mg by mouth daily.  02/26/18  Yes Biagio Borg, MD  atorvastatin (LIPITOR) 20 MG tablet Take 1 tablet (20 mg  total) by mouth daily. Patient taking differently: Take 20 mg by mouth at bedtime.  10/09/17  Yes Hochrein, Jeneen Rinks, MD  bimatoprost (LUMIGAN) 0.01 % SOLN Place 1 drop into both eyes at bedtime.   Yes [provider]  brinzolamide (AZOPT) 1 % ophthalmic suspension Place 1 drop into both eyes 2 (two) times daily.     Yes [provider]  Calcifediol ER (RAYALDEE) 30 MCG CPCR Take 30 mcg by mouth at bedtime.    Yes [provider]  metoprolol succinate (TOPROL-XL) 100 MG 24 hr tablet TAKE 1 TABLET BY MOUTH DAILY WITH OR IMMEDIATELY FOLLOWING A MEAL Patient taking differently: Take 100 mg by mouth daily.  09/04/17  Yes Minus Breeding, MD  Multiple Vitamin (MULTIVITAMIN) tablet Take 1 tablet by mouth daily. **Adult One A Day**   Yes [provider]  omeprazole (PRILOSEC) 20 MG capsule TAKE 1 CAPSULE(20 MG) BY MOUTH DAILY Patient taking differently: Take 20 mg by mouth daily as needed (reflux).  05/10/18  Yes Fenton Foy, NP  pilocarpine (PILOCAR) 4 % ophthalmic solution Place 1 drop into the left eye 2 (two) times daily.    Yes [provider]  potassium chloride SA (K-DUR,KLOR-CON) 20 MEQ tablet Take 1 tablet (20 mEq total) by mouth 2 (two) times daily. 05/17/18  Yes Danford, Suann Larry, MD  tamsulosin (FLOMAX) 0.4 MG CAPS capsule Take 1 capsule (0.4 mg total) by mouth 2 (two) times daily. Patient taking differently: Take 0.4 mg by mouth at bedtime.  05/06/18  Yes Biagio Borg, MD  telmisartan (MICARDIS) 80 MG tablet Take 1 tablet (80 mg total) by mouth daily. Patient taking differently: Take 40 mg by mouth daily at 12 noon.  10/01/17  Yes Minus Breeding, MD  torsemide (DEMADEX) 20 MG tablet Take 4 tablets (80 mg total) by mouth daily before breakfast. 05/17/18  Yes Danford, Suann Larry, MD  torsemide (DEMADEX) 20 MG tablet Take 2 tablets (40 mg total) by mouth daily at 6 PM. 05/16/18  Yes Danford, Suann Larry, MD  torsemide (DEMADEX) 20 MG tablet  Take 40 mg by mouth daily at 6 PM.   Yes [provider]  verapamil (CALAN-SR) 120 MG CR tablet TAKE 1 TABLET(120 MG) BY MOUTH AT BEDTIME Patient taking differently: Take 120 mg by mouth at bedtime.  04/29/18  Yes Minus Breeding, MD  warfarin (COUMADIN) 1 MG tablet Take 0.5 mg by mouth daily. Take with 3mg  to equal 3.5mg    Yes [provider]  warfarin (COUMADIN) 3 MG tablet Take 3 mg by mouth daily. Take with 0.5mg  to equal 3.5mg .   Yes [provider]  warfarin (COUMADIN) 5 MG tablet Take 0.5 tablets (2.5 mg total) by mouth daily at 6 PM. Patient not taking: Reported on 06/05/2018 05/17/18   Edwin Dada, MD    Inpatient Medications: Scheduled Meds: . atropine       Continuous Infusions: . [START ON 06/06/2018] ceFEPime (MAXIPIME) IV    .  ceFEPime (MAXIPIME) IV 2 g (06/05/18 1629)  . norepinephrine (LEVOPHED) Adult infusion    . vancomycin    . [START ON 06/06/2018] vancomycin     PRN Meds:   Allergies:    Allergies  Allergen Reactions  . Ibuprofen     REACTION: causes low blood pressure    Social History:   Social History   Socioeconomic History  . Marital status: Married    Spouse name: Not on file  . Number of children: 5  . Years of education: 10  . Highest education level: Not on file  Occupational History  . Occupation: Retired  Scientific laboratory technician  . Financial resource strain: Not on file  . Food insecurity:    Worry: Not on file    Inability: Not on file  . Transportation needs:    Medical: Not on file    Non-medical: Not on file  Tobacco Use  . Smoking status: Former Smoker    Years: 3.00    Types: Cigarettes, Cigars    Last attempt to quit: 12/05/2003    Years since quitting: 14.5  . Smokeless tobacco: Never Used  . Tobacco comment: does not smoke cigars anymore  Substance and Sexual Activity  . Alcohol use: Not on file    Comment: Very rarely  . Drug use: No  . Sexual activity: Not on file  Lifestyle  . Physical  activity:    Days per week: Not on file    Minutes per session: Not on file  . Stress: Not on file  Relationships  . Social connections:    Talks on phone: Not on file    Gets together: Not on file    Attends religious service: Not on file    Active member of club or organization: Not on file    Attends meetings of clubs or organizations: Not on file    Relationship status: Not on file  . Intimate partner violence:    Fear of current or ex partner: Not on file    Emotionally abused: Not on file    Physically abused: Not on file    Forced sexual activity: Not on file  Other Topics Concern  . Not on file  Social History Narrative   Fun: Stay home; sitting on the porch   Denies religious beliefs effecting health care    Family History:   No family history on file.   ROS:  Please see the history of present illness.  All other ROS reviewed and negative.     Physical Exam/Data:   Vitals:   06/05/18 1540 06/05/18 1545 06/05/18 1600 06/05/18 1615  BP: (!) 63/50  (!) 81/53 (!) 79/54  Pulse: (!) 38 (!) 57 (!) 34 82  Resp: 17 19 18  (!) 23  Temp:      TempSrc:      SpO2: 94% 95% 95% 92%  Weight:      Height:        Intake/Output Summary (Last 24 hours) at 06/05/2018 1630 Last data filed at 06/05/2018 1603 Gross per 24 hour  Intake 1004.77 ml  Output -  Net 1004.77 ml   Filed Weights   06/05/18 1447  Weight: 83 kg   Body mass index is 25.52 kg/m.  General: Chronically ill-appearing elderly male, thin, appears extremely tired but in no acute distress HEENT: normal Lymph: no adenopathy Neck: mild JVD Endocrine:  No thryomegaly Vascular: No carotid bruits; FA pulses 2+ bilaterally without bruits  Cardiac: Irregularly irregular rhythm with  2/6 systolic murmur Lungs:  clear to auscultation bilaterally, no wheezing, rhonchi or rales  Abd: soft, nontender, no hepatomegaly  Ext: 1-2+ pitting edema bilateral lower extremities Musculoskeletal:  No deformities, BUE and BLE  strength normal and equal Skin: warm and dry  Neuro: Diffusely weak, but no focal deficits noted Psych:  Normal affect   EKG:  The EKG was personally reviewed and demonstrates: slow atrial fibrillation Telemetry:  Telemetry was personally reviewed and demonstrates:  Slow atrial fibrillation   Relevant CV Studies: ECHO: 05/13/2018  - Left ventricle: The cavity size was normal. Wall thickness was increased in a pattern of mild LVH. Systolic function was normal. The estimated ejection fraction was in the range of 55% to 60%. Wall motion was normal; there were no regional wall motion abnormalities. - Aortic valve: Valve mobility was restricted. There was moderate stenosis. There was trivial regurgitation. Valve area (VTI): 1.16 cm^2. Valve area (Vmax): 1.03 cm^2. Valve area (Vmean): 1.07 cm^2. - Mitral valve: Calcified annulus. There was mild regurgitation. - Left atrium: The atrium was severely dilated. - Right atrium: The atrium was severely dilated. - Tricuspid valve: There was moderate regurgitation. - Pulmonary arteries: Systolic pressure was severely increased. PA peak pressure: 67 mm Hg (S).  Impressions: - Normal LV systolic function; mild LVH; calcified aortic valve with moderate AS (mean gradient 27 mmHg) and trace AI; mild MR; severe biatrial enlargement; moderate TR; severe pulmonary hypertension.   CATH: 12/23/2003 VENTRICULOGRAPHY: 1. Ejection fraction approximately 70% without regional wall motion abnormality. 2. No mitral regurgitation.  CORONARY ANGIOGRAPHY: 1. Left main: Angiographically normal. 2. LAD: Moderate size vessel giving rise to single diagonal. It is angiographically normal. 3. Circumflex: Moderate size vessel giving rise to a single branching obtuse marginal. It is angiographically normal. 4. RCA: Large dominant vessel. It is angiographically normal.  IMPRESSION/RECOMMENDATIONS: 1.  Angiographically normal coronary arteries. 2. Mild aortic stenosis. 3. No mitral stenosis or mitral regurgitation. 4. Severe pulmonary hypertension in the setting of markedly elevated left ventricular end-diastolic pressure.  The primary problem appears to be severe diastolic dysfunction with severe pulmonary hypertension and consequent right ventricular failure. We will try to decrease LVEDP with increasing diuretic.  Laboratory Data:  Chemistry Recent Labs  Lab 06/03/18 1409 06/05/18 1439 06/05/18 1525  NA 140 139 141  K 4.4 4.5 4.4  CL 98 106 104  CO2 28 24  --   GLUCOSE 121* 142* 140*  BUN 59* 59* 61*  CREATININE 1.93* 1.97* 2.20*  CALCIUM 9.2 8.1*  --   GFRNONAA 30* 28*  --   GFRAA 34* 33*  --   ANIONGAP  --  9  --     No results for input(s): PROT, ALBUMIN, AST, ALT, ALKPHOS, BILITOT in the last 168 hours. Hematology Recent Labs  Lab 06/05/18 1439 06/05/18 1525  WBC 5.9  --   RBC 3.53*  --   HGB 10.7* 12.2*  HCT 36.2* 36.0*  MCV 102.5*  --   MCH 30.3  --   MCHC 29.6*  --   RDW 16.1*  --   PLT 195  --    Cardiac EnzymesNo results for input(s): TROPONINI in the last 168 hours.  Recent Labs  Lab 06/05/18 1519  TROPIPOC 0.04    BNPNo results for input(s): BNP, PROBNP in the last 168 hours.  DDimer No results for input(s): DDIMER in the last 168 hours.  Radiology/Studies:  Dg Chest Portable 1 View  Result Date: 06/05/2018 CLINICAL DATA:  Chest pain EXAM: PORTABLE  CHEST 1 VIEW COMPARISON:  05/13/2018 FINDINGS: Cardiomegaly with moderate aortic atherosclerosis. Low lung volumes with mild diffuse interstitial edema and atelectasis at each lung base. External defibrillator paddle partial obscures the left lung base. Probable small bilateral pleural effusions blunting the costophrenic angles. IMPRESSION: Stable cardiomegaly with moderate aortic atherosclerosis. Bibasilar atelectasis with probable small bilateral pleural effusions. Electronically  Signed   By: Ashley Royalty M.D.   On: 06/05/2018 15:25    Assessment and Plan:   1. Hypotension: [name] [name] presents with sudden onset of hypotension of unclear etiology that has not responded to IVF or low-dose dopamine.  He is afebrile and does not have a leukocytosis, but his presentation is concerning for an ongoing infectious process/shock and agree with infectious work-up and antibiotics.  Low suspicion for cardiac etiology given negative ischemic work-up thus far and normal EF on echocardiogram 3 weeks ago.  He will require pressor support to maintain MAP > 65 and we recommend consult to PCCM for further management.  His daughter has confirmed that patient is DNR/DNI and would not like any aggressive interventions including central line placement. Would greatly benefit from palliative care/hospice consult.   2. Bradycardia: Presented with HR in the 30s and was given atropine and started on dopamine drip with improvement in HR to 60s.  ED planning to stop dopamine and start low-dose levo fed peripherally as patient does not desire central line.  Hold all AV nodal blocking agents.  3. Chronic diastolic HF: Most recent echocardiogram in 05/2018 showed EF of 55% with G2DD, moderate TR, and severe pulmonary hypertension.  He has mild JVD and bilateral lower extremity pitting edema but does not appear to be in overt heart failure.  Hold BB, CCB, home antihypertensive and  diuretics medications in the setting of bradycardia and hypotension.  4. PAF: Slow A. fib on EKG.  On warfarin for anticoagulation.  INR pending.  5.  Moderate aortic stenosis: Monitor volume status closely and avoid hypovolemia.  He is not a candidate for TAVR.     For questions or updates, please contact [place] Please consult [email or web address] for contact info under     Signed, Welford Roche, MD  06/05/2018 4:30 PM   I have personally seen and examined this patient with Dr. Isac Sarna. I agree with the  assessment and plan as outlined above.  [name] [name] has many chronic conditions including severe aortic stenosis, chronic diastolic CHF, paroxysmal atrial fibrillation on coumadin, stage 3 CKD and pulmonary HTN on supplemental home O2. He was recently discharged after an admission with volume overload and pneumonia. He was diuresed and treated with antibiotics for 7 days. He was seen in our office 2 days ago at which time he was doing well. He is known to have severe AS and has not been felt to be a candidate for TAVR due to his age and comorbidities. (seen in 2018 in TAVR clinic by Burt Knack).  He is now admitted from his SNF with c/o weakness, dyspnea and right upper back pain radiating to his chest.  He was found to be bradycardic. This looks like slow atrial fibrillation. He was hypotensive and started on dopamine by the ED staff. His HR improved but his blood pressure has remained soft.  He is known to have normal LV systolic function by echo several weeks ago.  Troponin is negative.  EKG without ischemic changes. I have reviewed his EKG and it shows atrial fibrillation with diffuse non-specific ST and T abnormalities.  Chest x-ray with  small bilateral pleural effusions He is now DNR after being seen by Palliative Care 2 weeks ago. His daughter is with him today and states that he does not desire invasive procedures, central line placement, CPR or intubation.  By exam he appears uncomfortable and c/o pain in his back. He has 2+ bilateral LE edema. CV: irreg irreg with late peaking harsh systolic murmur. Lungs:clear overall  It is unclear what has lead to his decompensation today. His heart rate is currently in the 70s. He is in permanent atrial fibrillation. No evidence of ischemia to suggest ACS. Given his back pain cannot exclude an aortic dissection but his BP is equal in both arms. He has end stage aortic stenosis and is not a candidate for TAVR. I do not think his current volume overload is  contributing to his hypotension. Possibly sepsis but WBC not elevated and he has had no fevers. He is DNR and desires no aggressive measures at this time. All anti-hypertensive medications are being held. We will ask PCCM to see him today and assist with management/goals of care.  No cardiac interventions planned at this time. He will need pressor support and possibly move toward comfort care if no easily reversible cause for his presentation.  Lauree Chandler 06/05/2018 5:28 PM

## 2018-06-05 NOTE — Progress Notes (Signed)
Pharmacy Antibiotic Note  [name] [name] is a 84 y.o. male admitted on 06/05/2018 with sepsis.  Pharmacy has been consulted for Cefepime and Vancomycin dosing. WBC - 5.9, afebrile; Scr-2.2, increased from baseline of 1.7-1.9  Plan: Cefepime 2g IV x1, then Cefepime 1g IV q24h Vancomycin 1750mg  IV x1, then Vancomycin 1g IV q24h Follow-renal fx for potential dose adjustments Monitor and adjust per renal fx, clinical status, C&S, vanc troughs as needed  Height: 5\' 11"  (180.3 cm) Weight: 182 lb 15.7 oz (83 kg) IBW/kg (Calculated) : 75.3  Temp (24hrs), Avg:97.4 F (36.3 C), Min:97.4 F (36.3 C), Max:97.4 F (36.3 C)  Recent Labs  Lab 06/03/18 1409 06/05/18 1525  CREATININE 1.93* 2.20*    Estimated Creatinine Clearance: 23.8 mL/min (A) (by C-G formula based on SCr of 2.2 mg/dL (H)).    Allergies  Allergen Reactions  . Ibuprofen     REACTION: causes low blood pressure    Antimicrobials this admission: Cefepime 10/30 >>  Vancomycin 10/30 >>   Dose adjustments this admission: N/A  Microbiology results: Pending  Thank you for allowing pharmacy to be a part of this patient's care.  Tyson Babinski 06/05/2018 3:29 PM

## 2018-06-05 NOTE — ED Notes (Signed)
Bilateral BP checked no difference noted.

## 2018-06-05 NOTE — ED Notes (Signed)
Pt repots rt shoulder blade pain and that radiates to rt chest. MD aware

## 2018-06-05 NOTE — ED Provider Notes (Signed)
[facility] EMERGENCY DEPARTMENT Provider Note   CSN: [id number] Arrival date & time: 06/05/18  1419     History   Chief Complaint Chief Complaint  Patient presents with  . Bradycardia    HPI [name] [name] is a 84 y.o. male.  The history is provided by the patient.  Weakness  Primary symptoms include no focal weakness, no loss of sensation, no loss of balance, no speech change, no memory loss, no movement disorder, no visual change, no auditory change, and no dizziness. Primary symptoms comment: generalized. This is a new problem. The current episode started 6 to 12 hours ago. The problem has not changed since onset.There was no focality noted. There has been no fever. The fever has been present for less than 1 day. Associated symptoms include chest pain. Pertinent negatives include no shortness of breath, no vomiting, no altered mental status, no confusion and no headaches. There were no medications administered prior to arrival. Associated medical issues comments: afib, aortic stenosis.    Past Medical History:  Diagnosis Date  . 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 Aortic stenosis   . Atrial fibrillation ([place])   . BPH (benign prostatic hypertrophy)   . Congestive heart failure, unspecified   . CVA (cerebral infarction)   . DJD (degenerative joint disease)   . Generalized osteoarthrosis, unspecified site   . Gout, unspecified   . HTN (hypertension), benign   . Pulmonary hypertension ([place])   . Pure hypercholesterolemia   . Renal insufficiency   . Unspecified cerebral artery occlusion with cerebral infarction   . Unspecified hemorrhoids without mention of complication     Patient Active Problem List   Diagnosis Date Noted  . Hypotension 06/05/2018  . Acute on chronic respiratory failure with hypoxia ([place]) 05/14/2018  . CHF (congestive heart failure) ([place]) 05/13/2018  . Gait disorder 05/06/2018  . BPH (benign prostatic hyperplasia) 05/06/2018    . Hyperglycemia 10/02/2017  . CKD (chronic kidney disease) stage 3, GFR 30-59 ml/min ([facility]) 10/02/2017  . Medicare annual wellness visit, subsequent 11/10/2016  . Routine adult health maintenance 11/10/2016  . Dyspnea 11/16/2015  . Lipoma of neck 06/15/2015  . Back pain 06/15/2015  . Lightheadedness 03/04/2015  . Glaucoma 01/14/2013  . Long term current use of anticoagulant 09/26/2010  . CHRONIC CORONARY ISCHEMIA 02/01/2010  . GYNECOMASTIA 11/15/2009  . DIZZINESS 07/13/2009  . Pulmonary hypertension ([place]) 09/25/2007  . GOUT 07/25/2007  . 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 Aortic valve disorder 07/25/2007  . ATRIAL FIBRILLATION 07/25/2007  . HEMORRHOIDS 07/25/2007  . BENIGN PROSTATIC HYPERTROPHY, HX OF 07/25/2007  . INGUINAL HERNIA 07/24/2007  . HYPERCHOLESTEROLEMIA 06/01/2007  . Essential hypertension 06/01/2007  . Congestive heart failure ([place]) 06/01/2007  . CVA 06/01/2007  . Disorder resulting from impaired renal function 06/01/2007  . Osteoarthritis 06/01/2007    Past Surgical History:  Procedure Laterality Date  . APPENDECTOMY    . CATARACT EXTRACTION    . cosmetic eye surgery    . INGUINAL HERNIA REPAIR          Home Medications    Prior to Admission medications   Medication Sig Start Date End Date Taking? Authorizing Provider  allopurinol (ZYLOPRIM) 100 MG tablet TAKE 1 TABLET(100 MG) BY MOUTH DAILY. Patient taking differently: Take 100 mg by mouth daily.  02/26/18  Yes Biagio Borg, MD  atorvastatin (LIPITOR) 20 MG tablet Take 1 tablet (20 mg total) by mouth daily. Patient taking differently: Take 20  mg by mouth at bedtime.  10/09/17  Yes Hochrein, Jeneen Rinks, MD  bimatoprost (LUMIGAN) 0.01 % SOLN Place 1 drop into both eyes at bedtime.   Yes [provider]  brinzolamide (AZOPT) 1 % ophthalmic suspension Place 1 drop into both eyes 2 (two) times daily.     Yes [provider]  Calcifediol ER (RAYALDEE) 30 MCG CPCR Take 30 mcg by  mouth at bedtime.    Yes [provider]  metoprolol succinate (TOPROL-XL) 100 MG 24 hr tablet TAKE 1 TABLET BY MOUTH DAILY WITH OR IMMEDIATELY FOLLOWING A MEAL Patient taking differently: Take 100 mg by mouth daily.  09/04/17  Yes Minus Breeding, MD  Multiple Vitamin (MULTIVITAMIN) tablet Take 1 tablet by mouth daily. **Adult One A Day**   Yes [provider]  omeprazole (PRILOSEC) 20 MG capsule TAKE 1 CAPSULE(20 MG) BY MOUTH DAILY Patient taking differently: Take 20 mg by mouth daily as needed (reflux).  05/10/18  Yes Fenton Foy, NP  pilocarpine (PILOCAR) 4 % ophthalmic solution Place 1 drop into the left eye 2 (two) times daily.    Yes [provider]  potassium chloride SA (K-DUR,KLOR-CON) 20 MEQ tablet Take 1 tablet (20 mEq total) by mouth 2 (two) times daily. 05/17/18  Yes Danford, Suann Larry, MD  tamsulosin (FLOMAX) 0.4 MG CAPS capsule Take 1 capsule (0.4 mg total) by mouth 2 (two) times daily. Patient taking differently: Take 0.4 mg by mouth at bedtime.  05/06/18  Yes Biagio Borg, MD  telmisartan (MICARDIS) 80 MG tablet Take 1 tablet (80 mg total) by mouth daily. Patient taking differently: Take 40 mg by mouth daily at 12 noon.  10/01/17  Yes Minus Breeding, MD  torsemide (DEMADEX) 20 MG tablet Take 4 tablets (80 mg total) by mouth daily before breakfast. 05/17/18  Yes Danford, Suann Larry, MD  torsemide (DEMADEX) 20 MG tablet Take 2 tablets (40 mg total) by mouth daily at 6 PM. 05/16/18  Yes Danford, Suann Larry, MD  torsemide (DEMADEX) 20 MG tablet Take 40 mg by mouth daily at 6 PM.   Yes [provider]  verapamil (CALAN-SR) 120 MG CR tablet TAKE 1 TABLET(120 MG) BY MOUTH AT BEDTIME Patient taking differently: Take 120 mg by mouth at bedtime.  04/29/18  Yes Minus Breeding, MD  warfarin (COUMADIN) 1 MG tablet Take 0.5 mg by mouth daily. Take with 3mg  to equal 3.5mg    Yes [provider]  warfarin (COUMADIN) 3 MG tablet Take 3 mg by  mouth daily. Take with 0.5mg  to equal 3.5mg .   Yes [provider]  warfarin (COUMADIN) 5 MG tablet Take 0.5 tablets (2.5 mg total) by mouth daily at 6 PM. Patient not taking: Reported on 06/05/2018 05/17/18   Edwin Dada, MD    Family History No family history on file.  Social History Social History   Tobacco Use  . Smoking status: Former Smoker    Years: 3.00    Types: Cigarettes, Cigars    Last attempt to quit: 12/05/2003    Years since quitting: 14.5  . Smokeless tobacco: Never Used  . Tobacco comment: does not smoke cigars anymore  Substance Use Topics  . Alcohol use: Not on file    Comment: Very rarely  . Drug use: No     Allergies   Ibuprofen   Review of Systems Review of Systems  Constitutional: Positive for fatigue. Negative for chills and fever.  HENT: Negative for ear pain and sore throat.  Eyes: Negative for pain and visual disturbance.  Respiratory: Positive for cough. Negative for shortness of breath.   Cardiovascular: Positive for chest pain. Negative for palpitations.  Gastrointestinal: Negative for abdominal pain and vomiting.  Genitourinary: Positive for dysuria. Negative for enuresis and hematuria.  Musculoskeletal: Negative for arthralgias and back pain.  Skin: Negative for color change and rash.  Neurological: Positive for weakness. Negative for dizziness, speech change, focal weakness, seizures, syncope, headaches and loss of balance.  Psychiatric/Behavioral: Negative for confusion and memory loss.  All other systems reviewed and are negative.    Physical Exam Updated Vital Signs  ED Triage Vitals  Enc Vitals Group     BP 06/05/18 1430 (!) 82/51     Pulse Rate 06/05/18 1430 (!) 43     Resp 06/05/18 1430 15     Temp 06/05/18 1445 (!) 97.4 F (36.3 C)     Temp Source 06/05/18 1445 Oral     SpO2 06/05/18 1430 96 %     Weight 06/05/18 1447 182 lb 15.7 oz (83 kg)     Height 06/05/18 1447 5\' 11"  (1.803 m)     Head  Circumference --      Peak Flow --      Pain Score 06/05/18 1446 3     Pain Loc --      Pain Edu? --      Excl. in [place]? --     Physical Exam  Constitutional: He is oriented to person, place, and time. He appears well-developed and well-nourished.  HENT:  Head: Normocephalic and atraumatic.  Eyes: Pupils are equal, round, and reactive to light. Conjunctivae and EOM are normal.  Neck: Normal range of motion. Neck supple.  Cardiovascular: Intact distal pulses. An irregularly irregular rhythm present. Bradycardia present.  No murmur heard. Pulmonary/Chest: Effort normal and breath sounds normal. No respiratory distress.  Abdominal: Soft. There is no tenderness.  Musculoskeletal: Normal range of motion. He exhibits no edema or tenderness.  Neurological: He is alert and oriented to person, place, and time. No cranial nerve deficit or sensory deficit. He exhibits normal muscle tone. Coordination normal.  Skin: Skin is warm and dry. Capillary refill takes less than 2 seconds.  Psychiatric: He has a normal mood and affect.  Nursing note and vitals reviewed.    ED Treatments / Results  Labs (all labs ordered are listed, but only abnormal results are displayed) Labs Reviewed  BASIC METABOLIC PANEL - Abnormal; Notable for the following components:      Result Value   Glucose, Bld 142 (*)    BUN 59 (*)    Creatinine, Ser 1.97 (*)    Calcium 8.1 (*)    GFR calc non Af Amer 28 (*)    GFR calc Af Amer 33 (*)    All other components within normal limits  CBC - Abnormal; Notable for the following components:   RBC 3.53 (*)    Hemoglobin 10.7 (*)    HCT 36.2 (*)    MCV 102.5 (*)    MCHC 29.6 (*)    RDW 16.1 (*)    All other components within normal limits  BRAIN NATRIURETIC PEPTIDE - Abnormal; Notable for the following components:   B Natriuretic Peptide 619.8 (*)    All other components within normal limits  HEPATIC FUNCTION PANEL - Abnormal; Notable for the following components:    Total Protein 6.4 (*)    Albumin 3.2 (*)    Alkaline Phosphatase 129 (*)  All other components within normal limits  PROTIME-INR - Abnormal; Notable for the following components:   Prothrombin Time 21.0 (*)    All other components within normal limits  TROPONIN I - Abnormal; Notable for the following components:   Troponin I 0.03 (*)    All other components within normal limits  I-STAT CHEM 8, ED - Abnormal; Notable for the following components:   BUN 61 (*)    Creatinine, Ser 2.20 (*)    Glucose, Bld 140 (*)    Calcium, Ion 1.09 (*)    Hemoglobin 12.2 (*)    HCT 36.0 (*)    All other components within normal limits  CBG MONITORING, ED - Abnormal; Notable for the following components:   Glucose-Capillary 128 (*)    All other components within normal limits  CULTURE, BLOOD (ROUTINE X 2)  CULTURE, BLOOD (ROUTINE X 2)  PROCALCITONIN  LACTIC ACID, PLASMA  I-STAT TROPONIN, ED    EKG EKG Interpretation  Date/Time:  Wednesday June 05 2018 14:25:55 EDT Ventricular Rate:  51 PR Interval:    QRS Duration: 102 QT Interval:  466 QTC Calculation: 430 R Axis:   -52 Text Interpretation:  Sinus or ectopic atrial bradycardia Atrial premature complex Left anterior fascicular block RSR' in V1 or V2, right VCD or RVH Nonspecific T abnrm, anterolateral leads Confirmed by Lennice Sites [phone]) on 06/05/2018 4:04:25 PM   Radiology Dg Chest Portable 1 View  Result Date: 06/05/2018 CLINICAL DATA:  Chest pain EXAM: PORTABLE CHEST 1 VIEW COMPARISON:  05/13/2018 FINDINGS: Cardiomegaly with moderate aortic atherosclerosis. Low lung volumes with mild diffuse interstitial edema and atelectasis at each lung base. External defibrillator paddle partial obscures the left lung base. Probable small bilateral pleural effusions blunting the costophrenic angles. IMPRESSION: Stable cardiomegaly with moderate aortic atherosclerosis. Bibasilar atelectasis with probable small bilateral pleural effusions.  Electronically Signed   By: Ashley Royalty M.D.   On: 06/05/2018 15:25    Procedures .Critical Care Performed by: Lennice Sites, DO Authorized by: Lennice Sites, DO   Critical care provider statement:    Critical care time (minutes):  75   Critical care time was exclusive of:  Separately billable procedures and treating other patients and teaching time   Critical care was necessary to treat or prevent imminent or life-threatening deterioration of the following conditions:  Cardiac failure and circulatory failure   Critical care was time spent personally by me on the following activities:  Development of treatment plan with patient or surrogate, discussions with consultants, discussions with primary provider, evaluation of patient's response to treatment, examination of patient, interpretation of cardiac output measurements, obtaining history from patient or surrogate, ordering and performing treatments and interventions, ordering and review of radiographic studies, ordering and review of laboratory studies, re-evaluation of patient's condition, pulse oximetry and review of old charts   I assumed direction of critical care for this patient from another provider in my specialty: no     (including critical care time)     EMERGENCY DEPARTMENT [place] CARDIAC EXAM "Study: Limited Ultrasound of the Heart and Pericardium"  INDICATIONS:Abnormal vital signs Multiple views of the heart and pericardium were obtained in real-time with a multi-frequency probe.  PERFORMED UE:AVWUJW IMAGES ARCHIVED?: Yes LIMITATIONS:  Body habitus VIEWS USED: Parasternal long axis, Parasternal short axis and Inferior Vena Cava INTERPRETATION: Decreased contractility and IVC normal  Medications Ordered in ED Medications  magic mouthwash (has no administration in time range)  acetaminophen (TYLENOL) tablet 650 mg (has no administration in time  range)    Or  acetaminophen (TYLENOL) suppository 650 mg (has no  administration in time range)  oxyCODONE (Oxy IR/ROXICODONE) immediate release tablet 5 mg (has no administration in time range)  HYDROmorphone (DILAUDID) injection 0.5 mg (has no administration in time range)  senna (SENOKOT) tablet 8.6 mg (has no administration in time range)  ondansetron (ZOFRAN-ODT) disintegrating tablet 4 mg (has no administration in time range)    Or  ondansetron (ZOFRAN) injection 4 mg (has no administration in time range)  alum & mag hydroxide-simeth (MAALOX/MYLANTA) 200-200-20 MG/5ML suspension 30 mL (has no administration in time range)  antiseptic oral rinse (BIOTENE) solution 15 mL (has no administration in time range)  nystatin (MYCOSTATIN/NYSTOP) topical powder (has no administration in time range)  polyvinyl alcohol (LIQUIFILM TEARS) 1.4 % ophthalmic solution 1 drop (has no administration in time range)  DOPamine (INTROPIN) 800 mg in dextrose 5 % 250 mL (3.2 mg/mL) infusion (15 mcg/kg/min  83 kg Intravenous Rate/Dose Verify 06/05/18 1547)  atropine injection 0.5 mg (0.5 mg Intravenous Given 06/05/18 1519)  sodium chloride 0.9 % bolus 500 mL (0 mLs Intravenous Stopped 06/05/18 1603)  ceFEPIme (MAXIPIME) 2 g in sodium chloride 0.9 % 100 mL IVPB (0 g Intravenous Stopped 06/05/18 1800)  sodium chloride 0.9 % bolus 500 mL (0 mLs Intravenous Stopped 06/05/18 1603)  sodium chloride 0.9 % bolus 500 mL (0 mLs Intravenous Stopped 06/05/18 1627)  acetaminophen (TYLENOL) tablet 650 mg (650 mg Oral Given 06/05/18 1614)  0.9 %  sodium chloride infusion ( Intravenous New Bag/Given 06/05/18 1628)     Initial Impression / Assessment and Plan / ED Course  I have reviewed the triage vital signs and the nursing notes.  Pertinent labs & imaging results that were available during my care of the patient were reviewed by me and considered in my medical decision making (see chart for details).     [name] [name] is a 84 year old male with history of atrial fibrillation on  Coumadin, pulmonary hypertension on 2-1/2 L of oxygen at baseline, heart failure who presents to the ED with bradycardia, hypotension.  Patient bradycardic in the 30s and hypotensive in the 70s upon arrival.  Patient with no fever.  On his home 2 L of oxygen.  Patient with chest pain.  However normal mental status.  Patient is on metoprolol and Cardizem for atrial fibrillation.  EKG performed shows atrial fibrillation at a rate of 30.  Patient with no melena or hematochezia on exam.  No abdominal tenderness.  Faint pulses throughout.  Patient is not a dialysis patient.  Upon arrival atropine was given with no effect.  He was given 500 cc of fluid upon arrival.  Patient was given an additional 1500 cc of fluid.  Patient was started on dopamine infusion as likely concern for AV nodal blockade for cause of bradycardia and hypotension.  Bedside echocardiogram showed decreased contractility but no obvious effusion.  Patient had full IVC after fluid resuscitation.   Patient had no obvious pneumonia on chest x-ray but did have some signs of volume overload.  Patient with no significant electrolyte abnormality, kidney injury.  Creatinine is at baseline.  Troponin within normal limits.  INR subtherapeutic.  Overall no obvious cause of hypotension and bradycardia.  Possibly sepsis and patient was given IV antibiotics.  Lactic acid was normal.  White count was normal.  Patient did not have fever.  Patient had no melena on exam and doubt GI bleed.  Echocardiogram was unremarkable.  Suspect possibly secondary  to medication.  Patient is DNR and after discussion with cardiology, ICU family would like to make patient comfort care at this time and would not like to pursue any aggressive therapy including vasopressors, intubation.  Patient was made comfort care and all medications were stopped.  Social work was engaged to figure out possible placement however will likely need admission with palliative care following.  Pt admitted  for comfort care.   Final Clinical Impressions(s) / ED Diagnoses   Final diagnoses:  Symptomatic bradycardia  Hypotension, unspecified hypotension type    ED Discharge Orders    None       Lennice Sites, DO 06/05/18 1953

## 2018-06-05 NOTE — ED Notes (Signed)
spo2 decreased to 90% pt noted to have cough. MD made aware and at bedside. O2 increased to 5L

## 2018-06-05 NOTE — H&P (Signed)
History and Physical    TOSHIRO HANKEN SAY:[id number] DOB: 1926-03-03 DOA: 06/05/2018  PCP: Biagio Borg, MD  Patient coming from: [facility]  I have personally briefly reviewed patient's old medical records in [place]  Chief Complaint: Shortness of breath  HPI: HALL BIRCHARD is a 84 y.o. male with medical history significant for HFpEF (EF 55%), pulmonary hypertension with pulmonary artery aneurysm, severe aortic stenosis, chronic hypoxic respiratory failure on home O2 (2.5L), persistent atrial fibrillation on Coumadin, CKD 3-4, hypertension, and BPH who presented from [facility] with shortness of breath, hypertension, and bradycardia.  Patient was recently admitted from 05/13/2018-05/16/2018 for acute on chronic diastolic CHF exacerbation.   Patient is providing his own history and daughter is also at bedside to provide additional history. His daughter was with him this morning and stated that he was feeling well. He states he has been doing fairly well at the rehab facility however this afternoon woke up and felt short of breath.    He has also been having dyspnea with minimal exertion.  He was noted to be hypotensive and bradycardic at the facility and EMS were called.  He was seen in his cardiology clinic on 06/03/2018 at which time his eplerenone was discontinued due to renal dysfunction.  He was continued on his torsemide, metoprolol, and verapamil.  Patient denies any associated chest pain, palpitations, lightheadedness/dizziness, abdominal pain, or any obvious bleeding.  ED Course:  In the ED patient was noted to be hypotensive with systolic blood pressures in the 60-70s and bradycardic with heart rate in the 30s.  Given a 500 cc bolus of normal saline and atropine without improvement.  He was given an additional liter of fluid and started on a dopamine infusion with improvement in his heart rate.  Per ED providers documentation of bedside echogram showed decreased  contractility with no obvious effusion.  Portable chest x-ray showed cardiomegaly with small bilateral pleural effusions, external defibrillator pads in place.  Initial EKG showed atrial fibrillation with bradycardic rate 45-50s.  Lab work was notable for CBC 5.9, hemoglobin 10.7, creatinine 1.97 (similar to prior), INR 1.83, BNP 619.8, calcium 1.1, troponin I 0.03.  Cardiology and PCCM were consulted.  After discussion with family, the decision was to move towards palliative/comfort care.  Daughter and patient confirm that he is DNR/DNI and would not want any aggressive interventions which would include central line placement.  Hospitalist service was consulted to admit for palliative care pending hospice placement.  Review of Systems: As per HPI otherwise 10 point review of systems negative.    Past Medical History:  Diagnosis Date  . Aneurysm of pulmonary artery ([place])   . Aortic stenosis   . Atrial fibrillation ([place])   . BPH (benign prostatic hypertrophy)   . Congestive heart failure, unspecified   . CVA (cerebral infarction)   . DJD (degenerative joint disease)   . Generalized osteoarthrosis, unspecified site   . Gout, unspecified   . HTN (hypertension), benign   . Pulmonary hypertension ([place])   . Pure hypercholesterolemia   . Renal insufficiency   . Unspecified cerebral artery occlusion with cerebral infarction   . Unspecified hemorrhoids without mention of complication     Past Surgical History:  Procedure Laterality Date  . APPENDECTOMY    . CATARACT EXTRACTION    . cosmetic eye surgery    . INGUINAL HERNIA REPAIR       reports that he quit smoking about 14 years ago. His smoking use  included cigarettes and cigars. He quit after 3.00 years of use. He has never used smokeless tobacco. He reports that he does not use drugs. His alcohol history is not on file.  Allergies  Allergen Reactions  . Ibuprofen     REACTION: causes low blood pressure    Family History  Problem  Relation Age of Onset  . Hypertension Mother      Prior to Admission medications   Medication Sig Start Date End Date Taking? Authorizing Provider  allopurinol (ZYLOPRIM) 100 MG tablet TAKE 1 TABLET(100 MG) BY MOUTH DAILY. Patient taking differently: Take 100 mg by mouth daily.  02/26/18  Yes Biagio Borg, MD  atorvastatin (LIPITOR) 20 MG tablet Take 1 tablet (20 mg total) by mouth daily. Patient taking differently: Take 20 mg by mouth at bedtime.  10/09/17  Yes Hochrein, Jeneen Rinks, MD  bimatoprost (LUMIGAN) 0.01 % SOLN Place 1 drop into both eyes at bedtime.   Yes [provider]  brinzolamide (AZOPT) 1 % ophthalmic suspension Place 1 drop into both eyes 2 (two) times daily.     Yes [provider]  Calcifediol ER (RAYALDEE) 30 MCG CPCR Take 30 mcg by mouth at bedtime.    Yes [provider]  metoprolol succinate (TOPROL-XL) 100 MG 24 hr tablet TAKE 1 TABLET BY MOUTH DAILY WITH OR IMMEDIATELY FOLLOWING A MEAL Patient taking differently: Take 100 mg by mouth daily.  09/04/17  Yes Minus Breeding, MD  Multiple Vitamin (MULTIVITAMIN) tablet Take 1 tablet by mouth daily. **Adult One A Day**   Yes [provider]  omeprazole (PRILOSEC) 20 MG capsule TAKE 1 CAPSULE(20 MG) BY MOUTH DAILY Patient taking differently: Take 20 mg by mouth daily as needed (reflux).  05/10/18  Yes Fenton Foy, NP  pilocarpine (PILOCAR) 4 % ophthalmic solution Place 1 drop into the left eye 2 (two) times daily.    Yes [provider]  potassium chloride SA (K-DUR,KLOR-CON) 20 MEQ tablet Take 1 tablet (20 mEq total) by mouth 2 (two) times daily. 05/17/18  Yes Danford, Suann Larry, MD  tamsulosin (FLOMAX) 0.4 MG CAPS capsule Take 1 capsule (0.4 mg total) by mouth 2 (two) times daily. Patient taking differently: Take 0.4 mg by mouth at bedtime.  05/06/18  Yes Biagio Borg, MD  telmisartan (MICARDIS) 80 MG tablet Take 1 tablet (80 mg total) by mouth daily. Patient taking  differently: Take 40 mg by mouth daily at 12 noon.  10/01/17  Yes Minus Breeding, MD  torsemide (DEMADEX) 20 MG tablet Take 4 tablets (80 mg total) by mouth daily before breakfast. 05/17/18  Yes Danford, Suann Larry, MD  torsemide (DEMADEX) 20 MG tablet Take 2 tablets (40 mg total) by mouth daily at 6 PM. 05/16/18  Yes Danford, Suann Larry, MD  torsemide (DEMADEX) 20 MG tablet Take 40 mg by mouth daily at 6 PM.   Yes [provider]  verapamil (CALAN-SR) 120 MG CR tablet TAKE 1 TABLET(120 MG) BY MOUTH AT BEDTIME Patient taking differently: Take 120 mg by mouth at bedtime.  04/29/18  Yes Minus Breeding, MD  warfarin (COUMADIN) 1 MG tablet Take 0.5 mg by mouth daily. Take with 3mg  to equal 3.5mg    Yes [provider]  warfarin (COUMADIN) 3 MG tablet Take 3 mg by mouth daily. Take with 0.5mg  to equal 3.5mg .   Yes [provider]  warfarin (COUMADIN) 5 MG tablet Take 0.5 tablets (2.5 mg total) by mouth daily at 6 PM. Patient not taking: Reported  on 06/05/2018 05/17/18   Edwin Dada, MD    Physical Exam: Vitals:   06/05/18 1845 06/05/18 1900 06/05/18 1930 06/05/18 1945  BP: 99/61 95/73 94/67  (!) 85/75  Pulse: 87 67 (!) 53 (!) 57  Resp: (!) 22 15 13 18   Temp:      TempSrc:      SpO2: 92% 94% 92% 94%  Weight:      Height:        Constitutional: NAD, calm, comfortable, interactive and appropriately answering questions Vitals:   06/05/18 1845 06/05/18 1900 06/05/18 1930 06/05/18 1945  BP: 99/61 95/73 94/67  (!) 85/75  Pulse: 87 67 (!) 53 (!) 57  Resp: (!) 22 15 13 18   Temp:      TempSrc:      SpO2: 92% 94% 92% 94%  Weight:      Height:       Eyes: PERRL, lids and conjunctivae normal ENMT: Mucous membranes are moist. Posterior pharynx clear of any exudate or lesions.Normal dentition.  Neck: normal, supple, no masses Respiratory: clear to auscultation bilaterally, no wheezing, no crackles. Normal respiratory effort. No accessory muscle use.    Cardiovascular: Irregularly irregular, bradycardic, 2/6 systolic murmurs throughout.  Trace lower extremity edema.  Abdomen: no tenderness, no masses palpated. No hepatosplenomegaly. Bowel sounds positive.  Musculoskeletal: no clubbing / cyanosis. No joint deformity upper and lower extremities. Good ROM, no contractures. Normal muscle tone.  Skin: no rashes, lesions, ulcers. No induration Neurologic: CN 2-12 grossly intact. Sensation intact, DTR normal. Strength 5/5 in all 4.  Psychiatric: Normal judgment and insight. Alert and oriented x 3. Normal mood.    Labs on Admission: I have personally reviewed following labs and imaging studies  CBC: Recent Labs  Lab 06/05/18 1439 06/05/18 1525  WBC 5.9  --   HGB 10.7* 12.2*  HCT 36.2* 36.0*  MCV 102.5*  --   PLT 195  --    Basic Metabolic Panel: Recent Labs  Lab 06/03/18 1409 06/05/18 1439 06/05/18 1525  NA 140 139 141  K 4.4 4.5 4.4  CL 98 106 104  CO2 28 24  --   GLUCOSE 121* 142* 140*  BUN 59* 59* 61*  CREATININE 1.93* 1.97* 2.20*  CALCIUM 9.2 8.1*  --    GFR: Estimated Creatinine Clearance: 23.8 mL/min (A) (by C-G formula based on SCr of 2.2 mg/dL (H)). Liver Function Tests: Recent Labs  Lab 06/05/18 1624  AST 23  ALT 16  ALKPHOS 129*  BILITOT 0.7  PROT 6.4*  ALBUMIN 3.2*   No results for input(s): LIPASE, AMYLASE in the last 168 hours. No results for input(s): AMMONIA in the last 168 hours. Coagulation Profile: Recent Labs  Lab 06/05/18 1624  INR 1.83   Cardiac Enzymes: Recent Labs  Lab 06/05/18 1718  TROPONINI 0.03*   BNP (last 3 results) No results for input(s): PROBNP in the last 8760 hours. HbA1C: No results for input(s): HGBA1C in the last 72 hours. CBG: Recent Labs  Lab 06/05/18 1511  GLUCAP 128*   Lipid Profile: No results for input(s): CHOL, HDL, LDLCALC, TRIG, CHOLHDL, LDLDIRECT in the last 72 hours. Thyroid Function Tests: No results for input(s): TSH, T4TOTAL, FREET4, T3FREE,  THYROIDAB in the last 72 hours. Anemia Panel: No results for input(s): VITAMINB12, FOLATE, FERRITIN, TIBC, IRON, RETICCTPCT in the last 72 hours. Urine analysis:    Component Value Date/Time   COLORURINE YELLOW 05/14/2018 [place] 05/14/2018 0251   LABSPEC 1.006 05/14/2018 0251  PHURINE 6.0 05/14/2018 0251   GLUCOSEU NEGATIVE 05/14/2018 0251   GLUCOSEU NEGATIVE 08/21/2013 1700   HGBUR NEGATIVE 05/14/2018 0251   BILIRUBINUR NEGATIVE 05/14/2018 0251   KETONESUR NEGATIVE 05/14/2018 0251   PROTEINUR NEGATIVE 05/14/2018 0251   UROBILINOGEN 0.2 08/21/2013 1700   NITRITE NEGATIVE 05/14/2018 0251   LEUKOCYTESUR NEGATIVE 05/14/2018 0251    Radiological Exams on Admission: Dg Chest Portable 1 View  Result Date: 06/05/2018 CLINICAL DATA:  Chest pain EXAM: PORTABLE CHEST 1 VIEW COMPARISON:  05/13/2018 FINDINGS: Cardiomegaly with moderate aortic atherosclerosis. Low lung volumes with mild diffuse interstitial edema and atelectasis at each lung base. External defibrillator paddle partial obscures the left lung base. Probable small bilateral pleural effusions blunting the costophrenic angles. IMPRESSION: Stable cardiomegaly with moderate aortic atherosclerosis. Bibasilar atelectasis with probable small bilateral pleural effusions. Electronically Signed   By: Ashley Royalty M.D.   On: 06/05/2018 15:25    EKG: Independently reviewed.  Atrial fibrillation with bradycardia  Assessment/Plan Principal Problem:   Admission for palliative care Active Problems:   Pulmonary hypertension (HCC)   Aortic valve disorder   Persistent atrial fibrillation   Congestive heart failure (HCC)   CKD (chronic kidney disease) stage 3, GFR 30-59 ml/min (HCC)   Hypotension   Bradycardia    84 y.o. male with medical history significant for HFpEF (EF 55%), pulmonary hypertension with pulmonary artery aneurysm, severe aortic stenosis, chronic hypoxic respiratory failure on home O2 (2.5L), persistent  atrial fibrillation on Coumadin, CKD 3-4, hypertension, and BPH who presented from [facility] with shortness of breath, hypertension, and bradycardia.  Per family wishes, they would like to avoid aggressive interventions and prefer to move towards comfort care/hospice.   Palliative care: Patient with multiple chronic medical issues as listed above presented with significant bradycardia and possible cardiogenic shock.  He was initially started on a dopamine infusion.  After discussion with the patient and his daughter, the decision was made to move towards comfort care/hospice.  He is DNR/DNI.  Family do not want central venous access placement or other aggressive interventions.  Will admit to [address] for palliative care and consult social work for hospice placement. -Discontinue dopamine infusion -Hold home Coumadin, metoprolol, verapamil -Consult social work for hospice placement -Comfort care/palliative orders placed -OxyIR as needed, Dilaudid as needed   DVT prophylaxis: None Code Status: DNR/DNI Family Communication: Discussed with daughter, [name]. Disposition Plan: Disposition pending hospice placement Consults called: Cardiology, PCCM consulted in ED Admission status: Observation   Zada Finders MD [facility] Pager [phone]  If 7PM-7AM, please contact night-coverage [email or web address] Password [email or web address]  06/05/2018, 8:02 PM

## 2018-06-05 NOTE — ED Notes (Signed)
CCM at bedside 

## 2018-06-05 NOTE — ED Notes (Signed)
Admitting MD made aware of critical troponin. Verbal to DC dopamine at this time.

## 2018-06-06 ENCOUNTER — Other Ambulatory Visit: Payer: Self-pay

## 2018-06-06 ENCOUNTER — Encounter (HOSPITAL_COMMUNITY): Payer: Self-pay | Admitting: General Practice

## 2018-06-06 DIAGNOSIS — I359 Nonrheumatic aortic valve disorder, unspecified: Secondary | ICD-10-CM | POA: Diagnosis not present

## 2018-06-06 DIAGNOSIS — Z515 Encounter for palliative care: Secondary | ICD-10-CM | POA: Diagnosis not present

## 2018-06-06 MED ORDER — ENSURE ENLIVE PO LIQD
237.0000 mL | Freq: Two times a day (BID) | ORAL | Status: DC
  Administered 2018-06-06 – 2018-06-11 (×8): 237 mL via ORAL

## 2018-06-06 NOTE — Plan of Care (Signed)

## 2018-06-06 NOTE — Progress Notes (Signed)
Nutrition Brief Note  Chart reviewed. Pt now transitioning to comfort care.  No further nutrition interventions warranted at this time.  Please re-consult as needed.   Eliyahu Bille A. Sharaya Boruff, RD, LDN, CDE Pager: [phone] After hours Pager: [phone]  

## 2018-06-06 NOTE — Progress Notes (Signed)
[facility] PROGRESS NOTE  [name] [name] PTW:[id number] DOB: 1925/08/12 DOA: 06/05/2018   PCP: Biagio Borg, MD     Assessment/Plan: Principal Problem:   Admission for palliative care Active Problems:   Pulmonary hypertension (HCC)   Aortic valve disorder   Persistent atrial fibrillation   Congestive heart failure (HCC)   CKD (chronic kidney disease) stage 3, GFR 30-59 ml/min (HCC)   Hypotension   Bradycardia    84 y.o. male with medical history significant for HFpEF (EF 55%), pulmonary hypertension with pulmonary artery aneurysm, severe aortic stenosis, chronic hypoxic respiratory failure on home O2 (2.5L), persistent atrial fibrillation on Coumadin, CKD 3-4, hypertension, and BPH who presented from [facility] with shortness of breath, hypertension, and bradycardia.  Patient was recently admitted from 05/13/2018-05/16/2018 for acute on chronic diastolic CHF exacerbation.  In the ED patient was noted to be hypotensive with systolic blood pressures in the 60-70s and bradycardic with heart rate in the 30s.  Given a 500 cc bolus of normal saline and atropine without improvement. Initial EKG showed atrial fibrillation with bradycardic rate 45-50s.Daughter and patient confirm that he is DNR/DNI and would not want any aggressive interventions which would include central line placement.  Hospitalist service was consulted to admit for palliative care pending hospice placement.  Assessment/Plan Palliative care: Patient with multiple chronic medical issues as listed above presented with significant bradycardia and possible cardiogenic shock.  He was initially started on a dopamine infusion.  After discussion with the patient and his daughter, the decision was made to move towards comfort care/hospice.  He is DNR/DNI.  Family do not want central venous access placement or other aggressive interventions.  Admitted to [facility] [address] for palliative care and consult social work for hospice  placement. -Discontinued dopamine infusion -Hold home Coumadin, metoprolol, verapamil -Consult social work for hospice placement -Comfort care/palliative orders placed -OxyIR as needed, Dilaudid as needed -Unfortunately, [facility] is unable to offer a bed today  DVT prophylaxsis scd's   Code Status:  dnr  Family Communication: Discussed in detail with the patient, all imaging results, lab results explained to the patient   Disposition Plan:   [facility] when bed available        Consultants:  None   Procedures:  None   Antibiotics: Anti-infectives (From admission, onward)   Start     Dose/Rate Route Frequency Ordered Stop   06/06/18 1700  vancomycin (VANCOCIN) IVPB 1000 mg/200 mL premix  Status:  Discontinued     1,000 mg 200 mL/hr over 60 Minutes Intravenous Every 24 hours 06/05/18 1551 06/05/18 1748   06/06/18 1630  ceFEPIme (MAXIPIME) 1 g in sodium chloride 0.9 % 100 mL IVPB  Status:  Discontinued     1 g 200 mL/hr over 30 Minutes Intravenous Every 24 hours 06/05/18 1551 06/05/18 1748   06/05/18 1530  ceFEPIme (MAXIPIME) 2 g in sodium chloride 0.9 % 100 mL IVPB     2 g 200 mL/hr over 30 Minutes Intravenous  Once 06/05/18 1529 06/05/18 1800   06/05/18 1530  vancomycin (VANCOCIN) 1,750 mg in sodium chloride 0.9 % 500 mL IVPB  Status:  Discontinued     1,750 mg 250 mL/hr over 120 Minutes Intravenous  Once 06/05/18 1529 06/05/18 1748         HPI/Subjective: Complaining of heel pain, unna boots applied   Objective: Vitals:   06/05/18 2030 06/05/18 2047 06/06/18 0613 06/06/18 1359  BP: 137/88 (!) 105/59 94/61 112/69  Pulse: (!) 56 61 67 66  Resp: 18 18 14 17   Temp:  (!) 97.5 F (36.4 C) 97.8 F (36.6 C) 97.7 F (36.5 C)  TempSrc:  Oral Oral Oral  SpO2: 96% 94% 99% 99%  Weight:  85.6 kg    Height:  5\' 7"  (1.702 m)      Intake/Output Summary (Last 24 hours) at 06/06/2018 1558 Last data filed at 06/06/2018 1026 Gross per 24 hour  Intake 1337 ml   Output 450 ml  Net 887 ml    Exam:  Examination:  General exam: frail ,eldelrly , conversive  Respiratory system: Clear to auscultation. Respiratory effort normal. Cardiovascular system: S1 & S2 heard, RRR. No JVD, murmurs, rubs, gallops or clicks. No pedal edema. Gastrointestinal system: Abdomen is nondistended, soft and nontender. No organomegaly or masses felt. Normal bowel sounds heard. Central nervous system: Alert and oriented. No focal neurological deficits. Extremities: Symmetric 5 x 5 power. Skin: No rashes, lesions or ulcers Psychiatry: Judgement and insight appear normal. Mood & affect appropriate.     Data Reviewed: I have personally reviewed following labs and imaging studies  Micro Results Recent Results (from the past 240 hour(s))  Blood Culture (routine x 2)     Status: None (Preliminary result)   Collection Time: 06/05/18  4:20 PM  Result Value Ref Range Status   Specimen Description BLOOD LEFT HAND  Final   Special Requests   Final    BOTTLES DRAWN AEROBIC AND ANAEROBIC Blood Culture adequate volume   Culture   Final    NO GROWTH < 24 HOURS Performed at [facility], [address] [place], [place] [address]    Report Status PENDING  Incomplete  Blood Culture (routine x 2)     Status: None (Preliminary result)   Collection Time: 06/05/18  4:35 PM  Result Value Ref Range Status   Specimen Description BLOOD RIGHT HAND  Final   Special Requests   Final    BOTTLES DRAWN AEROBIC AND ANAEROBIC Blood Culture adequate volume   Culture   Final    NO GROWTH < 24 HOURS Performed at [facility], [place] [address] [place],  [address]    Report Status PENDING  Incomplete    Radiology Reports Dg Chest 2 View  Result Date: 05/13/2018 CLINICAL DATA:  Shortness of Breath EXAM: CHEST - 2 VIEW COMPARISON:  05/09/2018 FINDINGS: Cardiac shadow is stable. Elevation of the left hemidiaphragm is again seen. Aortic calcifications are again noted.  Bibasilar atelectasis are again identified slightly increased on the right when compared with the prior exam. No new focal infiltrate or effusion is seen. Degenerative changes of the thoracic spine are noted. IMPRESSION: Slight increase in the degree of right basilar atelectasis. Electronically Signed   By: Inez Catalina M.D.   On: 05/13/2018 16:16   Dg Chest 2 View  Result Date: 05/09/2018 CLINICAL DATA:  Dyspnea, cough, and wheezing for the past 3 weeks. Symptoms of indigestion. History of CHF, pulmonary hypertension, coronary artery disease, valvular heart disease. EXAM: CHEST - 2 VIEW COMPARISON:  Chest x-ray of March 22, 2017 FINDINGS: There is chronic elevation of the left hemidiaphragm. There is new patchy density at both lung bases. There is a small amount of pleural fluid on the right which is not new. The cardiac silhouette is enlarged. The right hilar structures remain prominent. There is tortuosity of the ascending and descending thoracic aorta with mural calcification. There is multilevel degenerative disc disease of  the thoracic spine. IMPRESSION: Chronic bronchitic smoking related changes. New bibasilar atelectasis with chronic small bilateral pleural effusions. Low-grade CHF. Thoracic aortic atherosclerosis. Electronically Signed   By: David  Martinique M.D.   On: 05/09/2018 15:25   Dg Chest Portable 1 View  Result Date: 06/05/2018 CLINICAL DATA:  Chest pain EXAM: PORTABLE CHEST 1 VIEW COMPARISON:  05/13/2018 FINDINGS: Cardiomegaly with moderate aortic atherosclerosis. Low lung volumes with mild diffuse interstitial edema and atelectasis at each lung base. External defibrillator paddle partial obscures the left lung base. Probable small bilateral pleural effusions blunting the costophrenic angles. IMPRESSION: Stable cardiomegaly with moderate aortic atherosclerosis. Bibasilar atelectasis with probable small bilateral pleural effusions. Electronically Signed   By: Ashley Royalty M.D.   On:  06/05/2018 15:25     CBC Recent Labs  Lab 06/05/18 1439 06/05/18 1525  WBC 5.9  --   HGB 10.7* 12.2*  HCT 36.2* 36.0*  PLT 195  --   MCV 102.5*  --   MCH 30.3  --   MCHC 29.6*  --   RDW 16.1*  --     Chemistries  Recent Labs  Lab 06/03/18 1409 06/05/18 1439 06/05/18 1525 06/05/18 1624  NA 140 139 141  --   K 4.4 4.5 4.4  --   CL 98 106 104  --   CO2 28 24  --   --   GLUCOSE 121* 142* 140*  --   BUN 59* 59* 61*  --   CREATININE 1.93* 1.97* 2.20*  --   CALCIUM 9.2 8.1*  --   --   AST  --   --   --  23  ALT  --   --   --  16  ALKPHOS  --   --   --  129*  BILITOT  --   --   --  0.7   ------------------------------------------------------------------------------------------------------------------ estimated creatinine clearance is 23.3 mL/min (A) (by C-G formula based on SCr of 2.2 mg/dL (H)). ------------------------------------------------------------------------------------------------------------------ No results for input(s): HGBA1C in the last 72 hours. ------------------------------------------------------------------------------------------------------------------ No results for input(s): CHOL, HDL, LDLCALC, TRIG, CHOLHDL, LDLDIRECT in the last 72 hours. ------------------------------------------------------------------------------------------------------------------ No results for input(s): TSH, T4TOTAL, T3FREE, THYROIDAB in the last 72 hours.  Invalid input(s): FREET3 ------------------------------------------------------------------------------------------------------------------ No results for input(s): VITAMINB12, FOLATE, FERRITIN, TIBC, IRON, RETICCTPCT in the last 72 hours.  Coagulation profile Recent Labs  Lab 06/05/18 1624  INR 1.83    No results for input(s): DDIMER in the last 72 hours.  Cardiac Enzymes Recent Labs  Lab 06/05/18 1718  TROPONINI 0.03*    ------------------------------------------------------------------------------------------------------------------ Invalid input(s): POCBNP   CBG: Recent Labs  Lab 06/05/18 1511  GLUCAP 128*       Studies: Dg Chest Portable 1 View  Result Date: 06/05/2018 CLINICAL DATA:  Chest pain EXAM: PORTABLE CHEST 1 VIEW COMPARISON:  05/13/2018 FINDINGS: Cardiomegaly with moderate aortic atherosclerosis. Low lung volumes with mild diffuse interstitial edema and atelectasis at each lung base. External defibrillator paddle partial obscures the left lung base. Probable small bilateral pleural effusions blunting the costophrenic angles. IMPRESSION: Stable cardiomegaly with moderate aortic atherosclerosis. Bibasilar atelectasis with probable small bilateral pleural effusions. Electronically Signed   By: Ashley Royalty M.D.   On: 06/05/2018 15:25      Lab Results  Component Value Date   HGBA1C 6.5 10/02/2017   HGBA1C 6.1 03/22/2017   HGBA1C 6.6 (H) 11/10/2016   Lab Results  Component Value Date   LDLCALC 71 10/02/2017   CREATININE 2.20 (H) 06/05/2018  Scheduled Meds: . feeding supplement (ENSURE ENLIVE)  237 mL Oral BID BM   Continuous Infusions:   LOS: 1 day    Time spent: >30 MINS    Reyne Dumas  [facility] Pager [phone]. If 7PM-7AM, please contact night-coverage at [email or web address], password [facility] 06/06/2018, 3:58 PM  LOS: 1 day

## 2018-06-06 NOTE — Social Work (Addendum)
CSW met with pt and two daughters. Pt friend also present at bedside. Pt states that he would like to go home with [facility] services, pt from [facility]. Pt daughters unable to provide 24/7 assistance in pt home, prefer hospice care at [facility]. Received verbal permission to speak with [place] liaison. Pt family understands that if no bed is available at [facility] today or tomorrow that we will have to give another referral to [facility].  Referral given to [facility] with [facility]. Continuing to follow.  Westley Hummer, MSW, [place] Work [phone]

## 2018-06-06 NOTE — Progress Notes (Signed)
Hospice and [facility])  Received request from Molinda Bailiff, for residential hospice at [facility].  Pt and chart under review at this time. Unfortunately, [facility] is unable to offer a bed today.  Met with patient, explained services and answered questions.   LCSW and family aware that [facility] will continue to follow and update when/if a bed would be able to be offered.  Please call with any hospice related questions or concerns  Thank you, Venia Carbon BSN, [facility] (listed in [place]) [phone]

## 2018-06-06 NOTE — Social Work (Addendum)
CSW acknowledging consult for residential hospice placement will meet with family to determine preference and then make referral.  1:08pm- Spoke with pt at bedside, pt amenable to CSW calling daughter [name]. Called [name] she is currently driving and requested CSW call back.   Westley Hummer, MSW, [place] Work [phone]

## 2018-06-07 ENCOUNTER — Ambulatory Visit: Payer: Medicare Other | Admitting: Physician Assistant

## 2018-06-07 DIAGNOSIS — I4819 Other persistent atrial fibrillation: Secondary | ICD-10-CM

## 2018-06-07 DIAGNOSIS — I359 Nonrheumatic aortic valve disorder, unspecified: Secondary | ICD-10-CM

## 2018-06-07 DIAGNOSIS — R001 Bradycardia, unspecified: Secondary | ICD-10-CM | POA: Diagnosis not present

## 2018-06-07 DIAGNOSIS — N183 Chronic kidney disease, stage 3 (moderate): Secondary | ICD-10-CM

## 2018-06-07 DIAGNOSIS — Z515 Encounter for palliative care: Secondary | ICD-10-CM | POA: Diagnosis not present

## 2018-06-07 DIAGNOSIS — I5032 Chronic diastolic (congestive) heart failure: Secondary | ICD-10-CM

## 2018-06-07 DIAGNOSIS — I272 Pulmonary hypertension, unspecified: Secondary | ICD-10-CM

## 2018-06-07 NOTE — Progress Notes (Signed)
Chaplain rec'd spiritual care consult as pt is moving toward hospice. Family friend present, helping pt to eat lunch. Chaplain offered ministry of presence.  Daughter [name] came by.  "We got this." she said to chaplain. Chaplain offered word of prayer and will be available if needed. Tamsen Snider Pager [phone]

## 2018-06-07 NOTE — Clinical Social Work Note (Signed)
CSW talked with MD regarding patient and discharge to a hospice facility. Calls made to [place] with [facility] and Venia Carbon with [facility]. Neither have beds today. MD contacted and updated and daughter [name] called and advised. CSW will continue to follow and be in contact with both hospice facilities regarding bed availability.  Tiburcio Linder Givens, MSW, LCSW Licensed Clinical Social Worker [place] [phone]

## 2018-06-07 NOTE — Progress Notes (Signed)
PROGRESS NOTE  [name] [name]  EXB:[id number] DOB: 1925-10-14 DOA: 06/05/2018 PCP: Biagio Borg, MD   Brief Narrative: [name] [name] is a 84 y.o. male with a history of chronic hypoxic respiratory failure, chronic HFpEF with recent admission 10/7-10/10 for exacerbation and pneumonia, pulmonary HTN, pulmonary artery aneurysm, severe aortic stenosis (not a TAVR candidate), permanent AFib on coumadin, stage III-IV CKD, HTN and BPH who presented from [facility] with dyspnea, hypotension, and bradycardia. In the ED patient was noted to be hypotensive with systolic blood pressures in the60-70s and bradycardic with heart rate in the 30s.Troponin negative, ECG demonstrating AFib with slow ventricular response, nonspecific ST-T changes. No nidus of infection or evidence of sepsis found on work up. Given a 500 cc bolus of normal saline and atropine without improvement. Dopamine was started with HR improvement but continued hypotension. Critical care and cardiology were consulted for admission, though they recommended hospice placement after discussion with patient and daughter who decline any aggressive measures  Daughter and patient confirm that he is DNR/DNI and would not want any aggressive interventions including pressors, central line placement. Hospitalists were consulted for admission for comfort measures and to facilitate admission to hospice.  Assessment & Plan: Principal Problem:   Admission for palliative care Active Problems:   Pulmonary hypertension (HCC)   Aortic valve disorder   Persistent atrial fibrillation   Congestive heart failure (HCC)   CKD (chronic kidney disease) stage 3, GFR 30-59 ml/min (HCC)   Hypotension   Bradycardia   Encounter for palliative care  Hypotension, bradycardia, acute on chronic/end stage diastolic CHF, and aortic stenosis, pulmonary HTN with pulmonary artery aneurysm: Decompensation noted without precipitant, suspect progression of end-stage processes  as above. Pt is very clear that he wishes for no further active interventions. He has been admitted for palliative care interventions, though his symptoms are very mild and are not requiring aggressive titration. Will have to downgrade to observation, though feel that transferring patient to SNF prior to placement in residential hospice would cause undue stress. Not a candidate for advanced therapies per cardiology.  - Continue supplemental oxygen, dilaudid or oxycodone prn dyspnea/pain, zofran prn nausea. - Hold beta blocker. Hypotension limiting diuretic. Given GOC, stopped coumadin.  Code Status: DNR Family Communication: None at bedside this AM. Disposition Plan: Residential hospice, no beds available at local hospices. Would appreciate confirmation that this is appropriate given his currently controlled symptoms. Palliative care consulted.  Consultants:   Cardiology  PCCM  Palliative care  Procedures:   None  Antimicrobials:  None   Subjective: Breathing is stable, no chest pain or other complaints. Confirms desire for comfort measures only, no active interventions.  Objective: BP 112/69 (BP Location: Left Arm)   Pulse 66   Temp 97.7 F (36.5 C) (Oral)   Resp 17   Ht 5\' 7"  (1.702 m)   Wt 85.6 kg   SpO2 99%   BMI 29.56 kg/m   Gen: Elderly pleasant male in no distress Pulm: Non-labored with supplemental oxygen. Clear to auscultation bilaterally.  CV: Irreg bradycardia, Harsh holosystolic murmur throughout, greatest at USB. Trace LE edema.  GI: Abdomen soft, non-tender, non-distended, with normoactive bowel sounds. No organomegaly or masses felt. Ext: Warm, no deformities Skin: No rashes, lesions or ulcers Neuro: Alert and oriented. No focal neurological deficits. Psych: Judgement and insight appear normal. Mood & affect appropriate.   Data Reviewed: I have personally reviewed following labs and imaging studies  CBC: Recent Labs  Lab 06/05/18 1439 06/05/18 1525  WBC 5.9  --   HGB 10.7* 12.2*  HCT 36.2* 36.0*  MCV 102.5*  --   PLT 195  --    Basic Metabolic Panel: Recent Labs  Lab 06/03/18 1409 06/05/18 1439 06/05/18 1525  NA 140 139 141  K 4.4 4.5 4.4  CL 98 106 104  CO2 28 24  --   GLUCOSE 121* 142* 140*  BUN 59* 59* 61*  CREATININE 1.93* 1.97* 2.20*  CALCIUM 9.2 8.1*  --    GFR: Estimated Creatinine Clearance: 23.3 mL/min (A) (by C-G formula based on SCr of 2.2 mg/dL (H)). Liver Function Tests: Recent Labs  Lab 06/05/18 1624  AST 23  ALT 16  ALKPHOS 129*  BILITOT 0.7  PROT 6.4*  ALBUMIN 3.2*   No results for input(s): LIPASE, AMYLASE in the last 168 hours. No results for input(s): AMMONIA in the last 168 hours. Coagulation Profile: Recent Labs  Lab 06/05/18 1624  INR 1.83   Cardiac Enzymes: Recent Labs  Lab 06/05/18 1718  TROPONINI 0.03*   BNP (last 3 results) No results for input(s): PROBNP in the last 8760 hours. HbA1C: No results for input(s): HGBA1C in the last 72 hours. CBG: Recent Labs  Lab 06/05/18 1511  GLUCAP 128*   Lipid Profile: No results for input(s): CHOL, HDL, LDLCALC, TRIG, CHOLHDL, LDLDIRECT in the last 72 hours. Thyroid Function Tests: No results for input(s): TSH, T4TOTAL, FREET4, T3FREE, THYROIDAB in the last 72 hours. Anemia Panel: No results for input(s): VITAMINB12, FOLATE, FERRITIN, TIBC, IRON, RETICCTPCT in the last 72 hours. Urine analysis:    Component Value Date/Time   COLORURINE YELLOW 05/14/2018 [place] 05/14/2018 0251   LABSPEC 1.006 05/14/2018 0251   PHURINE 6.0 05/14/2018 0251   GLUCOSEU NEGATIVE 05/14/2018 0251   GLUCOSEU NEGATIVE 08/21/2013 1700   HGBUR NEGATIVE 05/14/2018 0251   BILIRUBINUR NEGATIVE 05/14/2018 0251   KETONESUR NEGATIVE 05/14/2018 0251   PROTEINUR NEGATIVE 05/14/2018 0251   UROBILINOGEN 0.2 08/21/2013 1700   NITRITE NEGATIVE 05/14/2018 0251   LEUKOCYTESUR NEGATIVE 05/14/2018 0251   Recent Results (from the past 240  hour(s))  Blood Culture (routine x 2)     Status: None (Preliminary result)   Collection Time: 06/05/18  4:20 PM  Result Value Ref Range Status   Specimen Description BLOOD LEFT HAND  Final   Special Requests   Final    BOTTLES DRAWN AEROBIC AND ANAEROBIC Blood Culture adequate volume   Culture   Final    NO GROWTH 2 DAYS Performed at [facility], [place] [address], [place], [place] [address]    Report Status PENDING  Incomplete  Blood Culture (routine x 2)     Status: None (Preliminary result)   Collection Time: 06/05/18  4:35 PM  Result Value Ref Range Status   Specimen Description BLOOD RIGHT HAND  Final   Special Requests   Final    BOTTLES DRAWN AEROBIC AND ANAEROBIC Blood Culture adequate volume   Culture   Final    NO GROWTH 2 DAYS Performed at [facility], [place] [address], [place], [place] [address]    Report Status PENDING  Incomplete      Radiology Studies: No results found.  Scheduled Meds: . feeding supplement (ENSURE ENLIVE)  237 mL Oral BID BM   Continuous Infusions:   LOS: 2 days   Time spent: 25 minutes.  Patrecia Pour, MD [facility] [email or web address] Password [facility] 06/07/2018, 4:28 PM

## 2018-06-07 NOTE — Care Management CC44 (Signed)
Condition Code 44 Documentation Completed  Patient Details  Name: [name] [name] MRN: [id number] Date of Birth: May 27, 1926   Condition Code 26 given:   Called daughter left message Patient signature on Condition Code 5 notice:   see above Documentation of 2 MD's agreement:   per UR CM Code 44 added to claim:   yes with HAR note    Marilu Favre, RN 06/07/2018, 2:22 PM

## 2018-06-08 DIAGNOSIS — Z515 Encounter for palliative care: Secondary | ICD-10-CM | POA: Diagnosis not present

## 2018-06-08 DIAGNOSIS — I359 Nonrheumatic aortic valve disorder, unspecified: Secondary | ICD-10-CM | POA: Diagnosis not present

## 2018-06-08 DIAGNOSIS — R001 Bradycardia, unspecified: Secondary | ICD-10-CM | POA: Diagnosis not present

## 2018-06-08 MED ORDER — TORSEMIDE 20 MG PO TABS
80.0000 mg | ORAL_TABLET | Freq: Every day | ORAL | Status: DC
  Administered 2018-06-09 – 2018-06-11 (×2): 80 mg via ORAL
  Filled 2018-06-08 (×3): qty 4

## 2018-06-08 MED ORDER — TORSEMIDE 20 MG PO TABS
40.0000 mg | ORAL_TABLET | Freq: Every day | ORAL | Status: DC
  Administered 2018-06-09 – 2018-06-10 (×2): 40 mg via ORAL
  Filled 2018-06-08 (×2): qty 2

## 2018-06-08 MED ORDER — LATANOPROST 0.005 % OP SOLN
1.0000 [drp] | Freq: Every day | OPHTHALMIC | Status: DC
  Administered 2018-06-09 – 2018-06-10 (×3): 1 [drp] via OPHTHALMIC
  Filled 2018-06-08: qty 2.5

## 2018-06-08 MED ORDER — PANTOPRAZOLE SODIUM 40 MG PO TBEC: 40.0000 mg | DELAYED_RELEASE_TABLET | Freq: Every day | ORAL | Status: DC | PRN

## 2018-06-08 MED ORDER — TAMSULOSIN HCL 0.4 MG PO CAPS
0.4000 mg | ORAL_CAPSULE | Freq: Every day | ORAL | Status: DC
  Administered 2018-06-09 – 2018-06-10 (×3): 0.4 mg via ORAL
  Filled 2018-06-08 (×3): qty 1

## 2018-06-08 MED ORDER — BRINZOLAMIDE 1 % OP SUSP
1.0000 [drp] | Freq: Two times a day (BID) | OPHTHALMIC | Status: DC
  Administered 2018-06-08 – 2018-06-11 (×6): 1 [drp] via OPHTHALMIC
  Filled 2018-06-08: qty 10

## 2018-06-08 MED ORDER — TORSEMIDE 20 MG PO TABS: 40.0000 mg | ORAL_TABLET | Freq: Every day | ORAL | Status: DC

## 2018-06-08 NOTE — Progress Notes (Signed)
[facility] [place]: Visit 1230  Unfortunately [facility] is not able to offer a room today. RN spoke with daughter [name] via telephonically while visiting the pt's room. Daughter [name] and CSW are aware. [facility] liaison will follow up with CSW and family tomorrow or sooner if room becomes available.   Note: Daughter [name] will be in church on Sunday and request to be called either before 10:00am or after 2:00pm at [phone]. States pt wants to go home but daughter states not stable enough and has limited support. Daughter wishes to continue with [facility] placement.  Please do not hesitate to call with questions.  Thank You   Vicente Serene, [facility] Liaison [phone]

## 2018-06-08 NOTE — Consult Note (Signed)
Consultation Note Date: 06/08/2018   Patient Name: [name] [name]  DOB: 1925/10/27  MRN: [id number]  Age / Sex: 84 y.o., male  PCP: Biagio Borg, MD Referring Physician: Patrecia Pour, MD  Reason for Consultation: Establishing goals of care  HPI/Patient Profile: 84 y.o. male  with past medical history of HFpEF (EF of 55%), pulmonary hypertension on O2 (2 to 3 L), pulmonary artery aneurysm, severe aortic stenosis, A. fib on warfarin, and CKD 3-4.  Patient was recently hospitalized 05/13/2018 to 05/16/2018 with exacerbation of CHF.  He was discharged to rehab.  He is now readmitted on 06/05/2018 with hypotension and bradycardia with systolic blood pressure in the 60s and 70s and heart rate in 30s.  Patient was placed on a dopamine drip with improvement in both heart rate and blood pressure.  After discussion with family, patient was made comfort care with plan to transfer to residential hospice.  Palliative care was consulted to help assist with goals of care.  Clinical Assessment and Goals of Care: Patient is comfortable appearing.  He denies any distressing symptoms.  He is currently sitting at edge of bed.  He is alert and oriented and able to dissipate conversations regarding his goals.  Patient confirms a recent decline and tells me he agrees with plan for hospice care.  Patient says he would prefer to return home at time of discharge but would agree with placement of [facility] if eligible.  Vital signs noted and appear stable.  Patient reportedly eating well.  Called left message for hospice liaison for [facility].  Note Education officer, museum working on disposition.  I called and spoke with patient's daughter, [name] [name].  Patient has 5 children.  However, several live out of state.  [name] [name] says that she is really the only family involved in his care.  Prior to recent hospitalization, patient was living at home  alone.  [name] [name] was coming to the home daily to fix meals and then had hired caregivers for assistance with the evening hours.  She has noted a declining performance status over the past several months with patient requiring increasing amount of assistance from family and caregivers with activities of daily living.  At baseline, patient was ambulatory in the home with use of a walker but required assistance with bathing, dressing, and meals.  However, patient had reached a point where he was even struggling with those tasks while at rehab.   [name] [name] would prefer placement in an inpatient hospice facility.  However we discussed the alternative option of discharging home with hospice in the event that patient is felt not to be yet eligible for a hospice facility.  She would agree with hospice in the home but would not be able to facilitate 24/7 care, which patient will probably require.  Patient is at high risk for decline in the coming days to weeks.  SUMMARY OF RECOMMENDATIONS   1.  Social work consult to facilitate discharge to hospice 2.  Agree with comfort care 3.  Patient requests review  of his medications with reinitiation of any home medications that would be clinically appropriate. 4.  DNR confirmed      Primary Diagnoses: Present on Admission: . Hypotension . CKD (chronic kidney disease) stage 3, GFR 30-59 ml/min (HCC) . Pulmonary hypertension ([place]) . Aortic valve disorder   I have reviewed the medical record, interviewed the patient and family, and examined the patient. The following aspects are pertinent.  Past Medical History:  Diagnosis Date  . Aneurysm of pulmonary artery ([place])   . Aortic stenosis   . Aortic valve disorder   . Atrial fibrillation ([place])   . BPH (benign prostatic hypertrophy)   . CKD (chronic kidney disease), stage III ([place])   . Congestive heart failure, unspecified   . CVA (cerebral infarction)   . DJD (degenerative joint disease)   . Generalized  osteoarthrosis, unspecified site   . Gout, unspecified   . HTN (hypertension), benign   . Pulmonary hypertension ([place])   . Pure hypercholesterolemia   . Renal insufficiency   . Unspecified cerebral artery occlusion with cerebral infarction   . Unspecified hemorrhoids without mention of complication    Social History   Socioeconomic History  . Marital status: Married    Spouse name: Not on file  . Number of children: 5  . Years of education: 10  . Highest education level: Not on file  Occupational History  . Occupation: Retired  Scientific laboratory technician  . Financial resource strain: Not on file  . Food insecurity:    Worry: Not on file    Inability: Not on file  . Transportation needs:    Medical: Not on file    Non-medical: Not on file  Tobacco Use  . Smoking status: Former Smoker    Years: 3.00    Types: Cigarettes, Cigars    Last attempt to quit: 12/05/2003    Years since quitting: 14.5  . Smokeless tobacco: Never Used  . Tobacco comment: does not smoke cigars anymore  Substance and Sexual Activity  . Alcohol use: Not Currently    Comment: Very rarely  . Drug use: No  . Sexual activity: Not on file  Lifestyle  . Physical activity:    Days per week: Not on file    Minutes per session: Not on file  . Stress: Not on file  Relationships  . Social connections:    Talks on phone: Not on file    Gets together: Not on file    Attends religious service: Not on file    Active member of club or organization: Not on file    Attends meetings of clubs or organizations: Not on file    Relationship status: Not on file  Other Topics Concern  . Not on file  Social History Narrative   Fun: Stay home; sitting on the porch   Denies religious beliefs effecting health care   Family History  Problem Relation Age of Onset  . Hypertension Mother    Scheduled Meds: . feeding supplement (ENSURE ENLIVE)  237 mL Oral BID BM   Continuous Infusions: PRN Meds:.acetaminophen **OR**  acetaminophen, alum & mag hydroxide-simeth, antiseptic oral rinse, HYDROmorphone (DILAUDID) injection, magic mouthwash, nystatin, ondansetron **OR** ondansetron (ZOFRAN) IV, oxyCODONE, polyvinyl alcohol, senna Medications Prior to Admission:  Prior to Admission medications   Medication Sig Start Date End Date Taking? Authorizing Provider  allopurinol (ZYLOPRIM) 100 MG tablet TAKE 1 TABLET(100 MG) BY MOUTH DAILY. Patient taking differently: Take 100 mg by mouth daily.  02/26/18  Yes John,  Hunt Oris, MD  atorvastatin (LIPITOR) 20 MG tablet Take 1 tablet (20 mg total) by mouth daily. Patient taking differently: Take 20 mg by mouth at bedtime.  10/09/17  Yes Hochrein, Jeneen Rinks, MD  bimatoprost (LUMIGAN) 0.01 % SOLN Place 1 drop into both eyes at bedtime.   Yes [provider]  brinzolamide (AZOPT) 1 % ophthalmic suspension Place 1 drop into both eyes 2 (two) times daily.     Yes [provider]  Calcifediol ER (RAYALDEE) 30 MCG CPCR Take 30 mcg by mouth at bedtime.    Yes [provider]  metoprolol succinate (TOPROL-XL) 100 MG 24 hr tablet TAKE 1 TABLET BY MOUTH DAILY WITH OR IMMEDIATELY FOLLOWING A MEAL Patient taking differently: Take 100 mg by mouth daily.  09/04/17  Yes Minus Breeding, MD  Multiple Vitamin (MULTIVITAMIN) tablet Take 1 tablet by mouth daily. **Adult One A Day**   Yes [provider]  omeprazole (PRILOSEC) 20 MG capsule TAKE 1 CAPSULE(20 MG) BY MOUTH DAILY Patient taking differently: Take 20 mg by mouth daily as needed (reflux).  05/10/18  Yes Fenton Foy, NP  pilocarpine (PILOCAR) 4 % ophthalmic solution Place 1 drop into the left eye 2 (two) times daily.    Yes [provider]  potassium chloride SA (K-DUR,KLOR-CON) 20 MEQ tablet Take 1 tablet (20 mEq total) by mouth 2 (two) times daily. 05/17/18  Yes Danford, Suann Larry, MD  tamsulosin (FLOMAX) 0.4 MG CAPS capsule Take 1 capsule (0.4 mg total) by mouth 2 (two) times daily. Patient  taking differently: Take 0.4 mg by mouth at bedtime.  05/06/18  Yes Biagio Borg, MD  telmisartan (MICARDIS) 80 MG tablet Take 1 tablet (80 mg total) by mouth daily. Patient taking differently: Take 40 mg by mouth daily at 12 noon.  10/01/17  Yes Minus Breeding, MD  torsemide (DEMADEX) 20 MG tablet Take 4 tablets (80 mg total) by mouth daily before breakfast. 05/17/18  Yes Danford, Suann Larry, MD  torsemide (DEMADEX) 20 MG tablet Take 2 tablets (40 mg total) by mouth daily at 6 PM. 05/16/18  Yes Danford, Suann Larry, MD  torsemide (DEMADEX) 20 MG tablet Take 40 mg by mouth daily at 6 PM.   Yes [provider]  verapamil (CALAN-SR) 120 MG CR tablet TAKE 1 TABLET(120 MG) BY MOUTH AT BEDTIME Patient taking differently: Take 120 mg by mouth at bedtime.  04/29/18  Yes Minus Breeding, MD  warfarin (COUMADIN) 1 MG tablet Take 0.5 mg by mouth daily. Take with 3mg  to equal 3.5mg    Yes [provider]  warfarin (COUMADIN) 3 MG tablet Take 3 mg by mouth daily. Take with 0.5mg  to equal 3.5mg .   Yes [provider]  warfarin (COUMADIN) 5 MG tablet Take 0.5 tablets (2.5 mg total) by mouth daily at 6 PM. Patient not taking: Reported on 06/05/2018 05/17/18   Edwin Dada, MD   Allergies  Allergen Reactions  . Ibuprofen     REACTION: causes low blood pressure   Review of Systems  All other systems reviewed and are negative.   Physical Exam  Constitutional: He is oriented to person, place, and time.  Frail appearing, sitting at edge of bed  Neck: Normal range of motion. Neck supple.  Cardiovascular:  Irregular, bilateral lower extremity edema  Pulmonary/Chest: Effort normal.  On O2, breathing unlabored  Genitourinary:  Genitourinary Comments: Foley  Neurological: He is alert and oriented to person, place, and time.  Skin: Skin is warm and dry.  Vital Signs: BP (!) 130/94 (BP Location: Right Arm)   Pulse 96   Temp 98.4 F (36.9 C) (Oral)   Resp 18    Ht 5\' 7"  (1.702 m)   Wt 85.6 kg   SpO2 97%   BMI 29.56 kg/m  Pain Scale: 0-10   Pain Score: 0-No pain   SpO2: SpO2: 97 % O2 Device:SpO2: 97 % O2 Flow Rate: .O2 Flow Rate (L/min): 4 L/min  IO: Intake/output summary:   Intake/Output Summary (Last 24 hours) at 06/08/2018 0817 Last data filed at 06/07/2018 1829 Gross per 24 hour  Intake 600 ml  Output 1000 ml  Net -400 ml    LBM: Last BM Date: 06/06/18 Baseline Weight: Weight: 83 kg Most recent weight: Weight: 85.6 kg     Palliative Assessment/Data:     Time In: 0800 Time Out: 0830 Time Total: 30 minutes Greater than 50%  of this time was spent counseling and coordinating care related to the above assessment and plan.  Signed by: Irean Hong, NP   Please contact Palliative Medicine Team phone at [phone] for questions and concerns.  For individual provider: See Shea Evans

## 2018-06-08 NOTE — Progress Notes (Signed)
PROGRESS NOTE  [name] [name]  GNF:[id number] DOB: 02/10/1926 DOA: 06/05/2018 PCP: Biagio Borg, MD   Brief Narrative: [name] [name] is a 84 y.o. male with a history of chronic hypoxic respiratory failure, chronic HFpEF with recent admission 10/7-10/10 for exacerbation and pneumonia, pulmonary HTN, pulmonary artery aneurysm, severe aortic stenosis (not a TAVR candidate), permanent AFib on coumadin, stage III-IV CKD, HTN and BPH who presented from [facility] with dyspnea, hypotension, and bradycardia. In the ED patient was noted to be hypotensive with systolic blood pressures in the60-70s and bradycardic with heart rate in the 30s.Troponin negative, ECG demonstrating AFib with slow ventricular response, nonspecific ST-T changes. No nidus of infection or evidence of sepsis found on work up. Given a 500 cc bolus of normal saline and atropine without improvement. Dopamine was started with HR improvement but continued hypotension. Critical care and cardiology were consulted for admission, though they recommended hospice placement after discussion with patient and daughter who decline any aggressive measures  Daughter and patient confirm that he is DNR/DNI and would not want any aggressive interventions including pressors, central line placement. Hospitalists were consulted for admission for comfort measures and to facilitate admission to hospice.  Assessment & Plan: Principal Problem:   Admission for palliative care Active Problems:   Pulmonary hypertension (HCC)   Aortic valve disorder   Persistent atrial fibrillation   Congestive heart failure (HCC)   CKD (chronic kidney disease) stage 3, GFR 30-59 ml/min (HCC)   Hypotension   Bradycardia   Encounter for palliative care   Palliative care encounter  Hypotension, bradycardia, acute on chronic/end stage diastolic CHF, and aortic stenosis, pulmonary HTN with pulmonary artery aneurysm: Decompensation noted without precipitant, suspect  progression of end-stage processes as above. Pt is very clear that he wishes for no further active interventions. He has been admitted for palliative care interventions, though his symptoms are very mild and are not requiring aggressive titration. Will have to downgrade to observation, though feel that transferring patient to SNF prior to placement in residential hospice would cause undue stress. Not a candidate for advanced therapies per cardiology.  - Continue supplemental oxygen, dilaudid or oxycodone prn dyspnea/pain, zofran prn nausea. - Pt becoming symptomatic with leg swelling, will restart diuretic despite its possible lowering of blood pressure. Not symptomatic from heart rate standpoint so will continue to hold BB. Given GOC, stopped coumadin.  Code Status: DNR Family Communication: None at bedside this AM. Disposition Plan: Residential hospice. No bed available today.  Consultants:   Cardiology  PCCM  Palliative care  Procedures:   None  Antimicrobials:  None   Subjective: No chest pain or palpitations, dyspnea is stable. Feels legs are becoming more swollen.    Objective: BP (!) 130/94 (BP Location: Right Arm)   Pulse 96   Temp 98.4 F (36.9 C) (Oral)   Resp 18   Ht 5\' 7"  (1.702 m)   Wt 85.6 kg   SpO2 97%   BMI 29.56 kg/m   Gen: Pleasant elderly male in no distress Pulm: Nonlabored. No crackles. CV: Irreg tachycardia. Harsh systolic murmur, rub, or gallop. No JVD, 1+ dependent edema. GI: Abdomen soft, non-tender, non-distended, with normoactive bowel sounds.  Ext: Warm, no deformities Skin: No new rashes, lesions or ulcers on visualized skin.  Neuro: Alert and oriented. Severely diminished visual acuity, at baseline. No focal neurological deficits. Psych: Judgement and insight appear fair. Mood euthymic & affect congruent. Behavior is appropriate.    Data Reviewed: I have personally reviewed  following labs and imaging studies  CBC: Recent Labs  Lab  06/05/18 1439 06/05/18 1525  WBC 5.9  --   HGB 10.7* 12.2*  HCT 36.2* 36.0*  MCV 102.5*  --   PLT 195  --    Basic Metabolic Panel: Recent Labs  Lab 06/03/18 1409 06/05/18 1439 06/05/18 1525  NA 140 139 141  K 4.4 4.5 4.4  CL 98 106 104  CO2 28 24  --   GLUCOSE 121* 142* 140*  BUN 59* 59* 61*  CREATININE 1.93* 1.97* 2.20*  CALCIUM 9.2 8.1*  --    GFR: Estimated Creatinine Clearance: 23.3 mL/min (A) (by C-G formula based on SCr of 2.2 mg/dL (H)). Liver Function Tests: Recent Labs  Lab 06/05/18 1624  AST 23  ALT 16  ALKPHOS 129*  BILITOT 0.7  PROT 6.4*  ALBUMIN 3.2*   No results for input(s): LIPASE, AMYLASE in the last 168 hours. No results for input(s): AMMONIA in the last 168 hours. Coagulation Profile: Recent Labs  Lab 06/05/18 1624  INR 1.83   Cardiac Enzymes: Recent Labs  Lab 06/05/18 1718  TROPONINI 0.03*   BNP (last 3 results) No results for input(s): PROBNP in the last 8760 hours. HbA1C: No results for input(s): HGBA1C in the last 72 hours. CBG: Recent Labs  Lab 06/05/18 1511  GLUCAP 128*   Lipid Profile: No results for input(s): CHOL, HDL, LDLCALC, TRIG, CHOLHDL, LDLDIRECT in the last 72 hours. Thyroid Function Tests: No results for input(s): TSH, T4TOTAL, FREET4, T3FREE, THYROIDAB in the last 72 hours. Anemia Panel: No results for input(s): VITAMINB12, FOLATE, FERRITIN, TIBC, IRON, RETICCTPCT in the last 72 hours. Urine analysis:    Component Value Date/Time   COLORURINE YELLOW 05/14/2018 [place] 05/14/2018 0251   LABSPEC 1.006 05/14/2018 0251   PHURINE 6.0 05/14/2018 0251   GLUCOSEU NEGATIVE 05/14/2018 0251   GLUCOSEU NEGATIVE 08/21/2013 1700   HGBUR NEGATIVE 05/14/2018 0251   BILIRUBINUR NEGATIVE 05/14/2018 0251   KETONESUR NEGATIVE 05/14/2018 0251   PROTEINUR NEGATIVE 05/14/2018 0251   UROBILINOGEN 0.2 08/21/2013 1700   NITRITE NEGATIVE 05/14/2018 0251   LEUKOCYTESUR NEGATIVE 05/14/2018 0251   Recent  Results (from the past 240 hour(s))  Blood Culture (routine x 2)     Status: None (Preliminary result)   Collection Time: 06/05/18  4:20 PM  Result Value Ref Range Status   Specimen Description BLOOD LEFT HAND  Final   Special Requests   Final    BOTTLES DRAWN AEROBIC AND ANAEROBIC Blood Culture adequate volume   Culture   Final    NO GROWTH 3 DAYS Performed at [facility], [place] [address], [place], [place] [address]    Report Status PENDING  Incomplete  Blood Culture (routine x 2)     Status: None (Preliminary result)   Collection Time: 06/05/18  4:35 PM  Result Value Ref Range Status   Specimen Description BLOOD RIGHT HAND  Final   Special Requests   Final    BOTTLES DRAWN AEROBIC AND ANAEROBIC Blood Culture adequate volume   Culture   Final    NO GROWTH 3 DAYS Performed at [facility], [place] [address], [place], [place] [address]    Report Status PENDING  Incomplete      Radiology Studies: No results found.  Scheduled Meds: . brinzolamide  1 drop Both Eyes BID  . feeding supplement (ENSURE ENLIVE)  237 mL Oral BID BM  . latanoprost  1 drop Both  Eyes QHS  . tamsulosin  0.4 mg Oral QHS   Continuous Infusions:   LOS: 2 days   Time spent: 25 minutes.  Patrecia Pour, MD [facility] [email or web address] Password [facility] 06/08/2018, 6:31 PM

## 2018-06-09 DIAGNOSIS — R001 Bradycardia, unspecified: Secondary | ICD-10-CM | POA: Diagnosis not present

## 2018-06-09 DIAGNOSIS — I359 Nonrheumatic aortic valve disorder, unspecified: Secondary | ICD-10-CM | POA: Diagnosis not present

## 2018-06-09 DIAGNOSIS — I5032 Chronic diastolic (congestive) heart failure: Secondary | ICD-10-CM | POA: Diagnosis not present

## 2018-06-09 DIAGNOSIS — Z515 Encounter for palliative care: Secondary | ICD-10-CM | POA: Diagnosis not present

## 2018-06-09 DIAGNOSIS — N183 Chronic kidney disease, stage 3 (moderate): Secondary | ICD-10-CM | POA: Diagnosis not present

## 2018-06-09 MED ORDER — METOPROLOL SUCCINATE ER 100 MG PO TB24
100.0000 mg | ORAL_TABLET | Freq: Every day | ORAL | Status: DC
  Administered 2018-06-09: 100 mg via ORAL
  Filled 2018-06-09: qty 1

## 2018-06-09 MED ORDER — PILOCARPINE HCL 4 % OP SOLN
1.0000 [drp] | Freq: Two times a day (BID) | OPHTHALMIC | Status: DC
  Administered 2018-06-09 – 2018-06-11 (×3): 1 [drp] via OPHTHALMIC
  Filled 2018-06-09: qty 15

## 2018-06-09 MED ORDER — VERAPAMIL HCL ER 120 MG PO TBCR
120.0000 mg | EXTENDED_RELEASE_TABLET | Freq: Every day | ORAL | Status: DC
  Administered 2018-06-09: 120 mg via ORAL
  Filled 2018-06-09 (×2): qty 1

## 2018-06-09 MED ORDER — ALLOPURINOL 100 MG PO TABS
100.0000 mg | ORAL_TABLET | Freq: Every day | ORAL | Status: DC
  Administered 2018-06-09 – 2018-06-11 (×3): 100 mg via ORAL
  Filled 2018-06-09 (×3): qty 1

## 2018-06-09 MED ORDER — POTASSIUM CHLORIDE CRYS ER 20 MEQ PO TBCR
20.0000 meq | EXTENDED_RELEASE_TABLET | Freq: Two times a day (BID) | ORAL | Status: DC
  Administered 2018-06-09 – 2018-06-11 (×4): 20 meq via ORAL
  Filled 2018-06-09 (×4): qty 1

## 2018-06-09 NOTE — Progress Notes (Signed)
PROGRESS NOTE  [name] [name]  DHD:[id number] DOB: 1926/02/11 DOA: 06/05/2018 PCP: Biagio Borg, MD   Brief Narrative: [name] [name] is a 84 y.o. male with a history of chronic hypoxic respiratory failure, chronic HFpEF with recent admission 10/7-10/10 for exacerbation and pneumonia, pulmonary HTN, pulmonary artery aneurysm, severe aortic stenosis (not a TAVR candidate), permanent AFib on coumadin, stage III-IV CKD, HTN and BPH who presented from [facility] with dyspnea, hypotension, and bradycardia. In the ED patient was noted to be hypotensive with systolic blood pressures in the60-70s and bradycardic with heart rate in the 30s.Troponin negative, ECG demonstrating AFib with slow ventricular response, nonspecific ST-T changes. No nidus of infection or evidence of sepsis found on work up. Given a 500 cc bolus of normal saline and atropine without improvement. Dopamine was started with HR improvement but continued hypotension. Critical care and cardiology were consulted for admission, though they recommended hospice placement after discussion with patient and daughter who decline any aggressive measures  Daughter and patient confirm that he is DNR/DNI and would not want any aggressive interventions including pressors, central line placement. Hospitalists were consulted for admission for comfort measures and to facilitate admission to hospice.  Assessment & Plan: Principal Problem:   Admission for palliative care Active Problems:   Pulmonary hypertension (HCC)   Aortic valve disorder   Persistent atrial fibrillation   Congestive heart failure (HCC)   CKD (chronic kidney disease) stage 3, GFR 30-59 ml/min (HCC)   Hypotension   Bradycardia   Encounter for palliative care   Palliative care encounter  Hypotension, bradycardia, acute on chronic/end stage diastolic CHF, and aortic stenosis, pulmonary HTN with pulmonary artery aneurysm: Decompensation noted without precipitant, suspect  progression of end-stage processes as above. Not a candidate for advanced therapies per cardiology.  - Pt was admitted for palliative care interventions, though his symptoms are very mild and are not requiring aggressive titration. Clear for transfer to hospice when bed available (confirmed none to offer today). Alternative is DC home with home hospice, if symptoms continue to improve, would expect this to be more appropriate. - Continue supplemental oxygen, dilaudid or oxycodone prn dyspnea/pain, zofran prn nausea. - Restarted home medications for HR, volume.  - Given GOC, stopped coumadin.  AKI:  - Hold ARB with this and hypotension  Gout:  - Continue allopurinol  Code Status: DNR Family Communication: By phone today. Disposition Plan: Residential hospice once bed available and approved. Alternative is DC home with hospice.  Consultants:   Cardiology  PCCM  Palliative care  Procedures:   None  Antimicrobials:  None   Subjective: No new complaints. Feels better than at admission. Speaking with family on phone. Leg swelling is stable, dyspnea is severe and stable. No chest pain.    Objective: BP (!) 123/91 (BP Location: Left Arm)   Pulse 81   Temp 98.2 F (36.8 C) (Oral)   Resp 16   Ht 5\' 7"  (1.702 m)   Wt 85.6 kg   SpO2 99%   BMI 29.56 kg/m   Gen: 84 y.o. male in no distress Pulm: Labored breathing with any exertion, on supplemental oxygen, No crackles. CV: Rapid irreg. +Systolic murmur, no rub, or gallop. No JVD, 1+ pitting dependent edema. GI: Abdomen soft, non-tender, non-distended, with normoactive bowel sounds.  Ext: Warm, no deformities Skin: No rashes, lesions or ulcers on visualized skin.  Neuro: Alert and oriented. Perceives shapes in vision, unchanged. No other focal neurological deficits. Psych: Judgement and insight appear fair. Mood  euthymic & affect congruent. Behavior is appropriate.    Data Reviewed: I have personally reviewed following labs and  imaging studies  CBC: Recent Labs  Lab 06/05/18 1439 06/05/18 1525  WBC 5.9  --   HGB 10.7* 12.2*  HCT 36.2* 36.0*  MCV 102.5*  --   PLT 195  --    Basic Metabolic Panel: Recent Labs  Lab 06/03/18 1409 06/05/18 1439 06/05/18 1525  NA 140 139 141  K 4.4 4.5 4.4  CL 98 106 104  CO2 28 24  --   GLUCOSE 121* 142* 140*  BUN 59* 59* 61*  CREATININE 1.93* 1.97* 2.20*  CALCIUM 9.2 8.1*  --    GFR: Estimated Creatinine Clearance: 23.3 mL/min (A) (by C-G formula based on SCr of 2.2 mg/dL (H)). Liver Function Tests: Recent Labs  Lab 06/05/18 1624  AST 23  ALT 16  ALKPHOS 129*  BILITOT 0.7  PROT 6.4*  ALBUMIN 3.2*   No results for input(s): LIPASE, AMYLASE in the last 168 hours. No results for input(s): AMMONIA in the last 168 hours. Coagulation Profile: Recent Labs  Lab 06/05/18 1624  INR 1.83   Cardiac Enzymes: Recent Labs  Lab 06/05/18 1718  TROPONINI 0.03*   BNP (last 3 results) No results for input(s): PROBNP in the last 8760 hours. HbA1C: No results for input(s): HGBA1C in the last 72 hours. CBG: Recent Labs  Lab 06/05/18 1511  GLUCAP 128*   Lipid Profile: No results for input(s): CHOL, HDL, LDLCALC, TRIG, CHOLHDL, LDLDIRECT in the last 72 hours. Thyroid Function Tests: No results for input(s): TSH, T4TOTAL, FREET4, T3FREE, THYROIDAB in the last 72 hours. Anemia Panel: No results for input(s): VITAMINB12, FOLATE, FERRITIN, TIBC, IRON, RETICCTPCT in the last 72 hours. Urine analysis:    Component Value Date/Time   COLORURINE YELLOW 05/14/2018 [place] 05/14/2018 0251   LABSPEC 1.006 05/14/2018 0251   PHURINE 6.0 05/14/2018 0251   GLUCOSEU NEGATIVE 05/14/2018 0251   GLUCOSEU NEGATIVE 08/21/2013 1700   HGBUR NEGATIVE 05/14/2018 0251   BILIRUBINUR NEGATIVE 05/14/2018 0251   KETONESUR NEGATIVE 05/14/2018 0251   PROTEINUR NEGATIVE 05/14/2018 0251   UROBILINOGEN 0.2 08/21/2013 1700   NITRITE NEGATIVE 05/14/2018 0251    LEUKOCYTESUR NEGATIVE 05/14/2018 0251   Recent Results (from the past 240 hour(s))  Blood Culture (routine x 2)     Status: None (Preliminary result)   Collection Time: 06/05/18  4:20 PM  Result Value Ref Range Status   Specimen Description BLOOD LEFT HAND  Final   Special Requests   Final    BOTTLES DRAWN AEROBIC AND ANAEROBIC Blood Culture adequate volume   Culture   Final    NO GROWTH 4 DAYS Performed at [facility], [place] [address], [place], [place] [address]    Report Status PENDING  Incomplete  Blood Culture (routine x 2)     Status: None (Preliminary result)   Collection Time: 06/05/18  4:35 PM  Result Value Ref Range Status   Specimen Description BLOOD RIGHT HAND  Final   Special Requests   Final    BOTTLES DRAWN AEROBIC AND ANAEROBIC Blood Culture adequate volume   Culture   Final    NO GROWTH 4 DAYS Performed at [facility], [place] [address], [place], [place] [address]    Report Status PENDING  Incomplete      Radiology Studies: No results found.  Scheduled Meds: . brinzolamide  1 drop Both Eyes BID  . feeding  supplement (ENSURE ENLIVE)  237 mL Oral BID BM  . latanoprost  1 drop Both Eyes QHS  . tamsulosin  0.4 mg Oral QHS  . torsemide  40 mg Oral q1800  . torsemide  80 mg Oral QAC breakfast   Continuous Infusions:   LOS: 2 days   Time spent: 25 minutes.  Patrecia Pour, MD [facility] [email or web address] Password [facility] 06/09/2018, 4:56 PM

## 2018-06-09 NOTE — Progress Notes (Signed)
Daily Progress Note   Patient Name: [name] [name]       Date: 06/09/2018 DOB: 1926/04/18  Age: 84 y.o. MRN#: [id number] Attending Physician: Patrecia Pour, MD Primary Care Physician: Biagio Borg, MD Admit Date: 06/05/2018  Reason for Consultation/Follow-up: Establishing goals of care  Subjective: Patient sitting in chair.  He has no acute complaints.  Is comfortable appearing.  Length of Stay: 2  Current Medications: Scheduled Meds:  . brinzolamide  1 drop Both Eyes BID  . feeding supplement (ENSURE ENLIVE)  237 mL Oral BID BM  . latanoprost  1 drop Both Eyes QHS  . tamsulosin  0.4 mg Oral QHS  . torsemide  40 mg Oral q1800  . torsemide  80 mg Oral QAC breakfast    Continuous Infusions:   PRN Meds: acetaminophen **OR** acetaminophen, alum & mag hydroxide-simeth, antiseptic oral rinse, HYDROmorphone (DILAUDID) injection, magic mouthwash, nystatin, ondansetron **OR** ondansetron (ZOFRAN) IV, oxyCODONE, pantoprazole, polyvinyl alcohol, senna  Physical Exam  Constitutional: He is oriented to person, place, and time. He appears well-developed.  Frail appearing  Cardiovascular: Normal rate.  Pulmonary/Chest: Breath sounds normal.  Short of breath with movement  Abdominal: Soft. Bowel sounds are normal.  Neurological: He is alert and oriented to person, place, and time.  Skin: Skin is warm and dry.  LE edema  Nursing note and vitals reviewed.           Vital Signs: BP 131/77 (BP Location: Right Arm)   Pulse (!) 102   Temp 98.5 F (36.9 C) (Oral)   Resp 16   Ht 5\' 7"  (1.702 m)   Wt 85.6 kg   SpO2 97%   BMI 29.56 kg/m  SpO2: SpO2: 97 % O2 Device: O2 Device: Nasal Cannula O2 Flow Rate: O2 Flow Rate (L/min): 5 L/min  Intake/output summary:   Intake/Output Summary  (Last 24 hours) at 06/09/2018 1330 Last data filed at 06/09/2018 1028 Gross per 24 hour  Intake 1080 ml  Output 1500 ml  Net -420 ml   LBM: Last BM Date: 06/06/18 Baseline Weight: Weight: 83 kg Most recent weight: Weight: 85.6 kg       Palliative Assessment/Data:      Patient Active Problem List   Diagnosis Date Noted  . Palliative care encounter   . Encounter for palliative care  06/07/2018  . Hypotension 06/05/2018  . Bradycardia 06/05/2018  . Admission for palliative care 06/05/2018  . Acute on chronic respiratory failure with hypoxia ([place]) 05/14/2018  . CHF (congestive heart failure) ([place]) 05/13/2018  . Gait disorder 05/06/2018  . BPH (benign prostatic hyperplasia) 05/06/2018  . Hyperglycemia 10/02/2017  . CKD (chronic kidney disease) stage 3, GFR 30-59 ml/min (HCC) 10/02/2017  . Medicare annual wellness visit, subsequent 11/10/2016  . Routine adult health maintenance 11/10/2016  . Dyspnea 11/16/2015  . Lipoma of neck 06/15/2015  . Back pain 06/15/2015  . Lightheadedness 03/04/2015  . Glaucoma 01/14/2013  . Long term current use of anticoagulant 09/26/2010  . CHRONIC CORONARY ISCHEMIA 02/01/2010  . GYNECOMASTIA 11/15/2009  . DIZZINESS 07/13/2009  . Pulmonary hypertension ([place]) 09/25/2007  . GOUT 07/25/2007  . Aneurysm of pulmonary artery ([place]) 07/25/2007  . Aortic valve disorder 07/25/2007  . Persistent atrial fibrillation 07/25/2007  . HEMORRHOIDS 07/25/2007  . BENIGN PROSTATIC HYPERTROPHY, HX OF 07/25/2007  . INGUINAL HERNIA 07/24/2007  . HYPERCHOLESTEROLEMIA 06/01/2007  . Essential hypertension 06/01/2007  . Congestive heart failure ([place]) 06/01/2007  . CVA 06/01/2007  . Disorder resulting from impaired renal function 06/01/2007  . Osteoarthritis 06/01/2007    Palliative Care Assessment & Plan   Patient Profile: 84 y.o. male  with past medical history of HFpEF (EF of 55%), pulmonary hypertension on O2 (2 to 3 L), pulmonary artery aneurysm, severe aortic  stenosis, A. fib on warfarin, and CKD 3-4.  Patient was recently hospitalized 05/13/2018 to 05/16/2018 with exacerbation of CHF.  He was discharged to rehab.  He is now readmitted on 06/05/2018 with hypotension and bradycardia with systolic blood pressure in the 60s and 70s and heart rate in 30s.  Patient was placed on a dopamine drip with improvement in both heart rate and blood pressure.  After discussion with family, patient was made comfort care with plan to transfer to residential hospice.  Palliative care was consulted to help assist with goals of care.  Assessment: Patient is comfortable appearing.  Home meds have been restarted.  Appetite appears good.  Patient is on waiting list for residential hospice facility.  I spoke with the hospice liaison.  I have some concern that patient's improved condition might ultimately change his appropriateness for residential hospice.  However, I will defer to the hospice liaison, who will ultimately facilitate transfer.  I have talked with the daughter about the alternative of going home with hospice in the event that residential hospice is not an option.  Recommendations/Plan:  Continue supportive care  Patient on hospice facility waiting list   Thank you for allowing the Palliative Medicine Team to assist in the care of this patient.   Time In: 1200 Time Out: 1215 Total Time  15 minutes Prolonged Time Billed  no      Greater than 50%  of this time was spent counseling and coordinating care related to the above assessment and plan.  Irean Hong, NP  Please contact Palliative Medicine Team phone at [phone] for questions and concerns.

## 2018-06-09 NOTE — Progress Notes (Signed)
[facility]: 2:00pm  Unfortunately [facility] is not able to offer a room today. Family ([name]) and CSW [facility]) are aware [facility] liaison will follow up with CSW and family tomorrow or sooner if room becomes available. Please do not hesitate to call with questions.   Thank you. ?  Raina Mina, RN, BSN [facility] Liaison  [place] are on AMION

## 2018-06-10 DIAGNOSIS — Z515 Encounter for palliative care: Secondary | ICD-10-CM | POA: Diagnosis not present

## 2018-06-10 DIAGNOSIS — N183 Chronic kidney disease, stage 3 (moderate): Secondary | ICD-10-CM | POA: Diagnosis not present

## 2018-06-10 DIAGNOSIS — I5032 Chronic diastolic (congestive) heart failure: Secondary | ICD-10-CM | POA: Diagnosis not present

## 2018-06-10 LAB — CULTURE, BLOOD (ROUTINE X 2)
CULTURE: NO GROWTH
Culture: NO GROWTH
Special Requests: ADEQUATE
Special Requests: ADEQUATE

## 2018-06-10 MED ORDER — METOPROLOL SUCCINATE ER 25 MG PO TB24
25.0000 mg | ORAL_TABLET | Freq: Every day | ORAL | Status: DC
  Filled 2018-06-10: qty 1

## 2018-06-10 NOTE — Progress Notes (Signed)
[facility] - [facility] [place]  Unfortunately [name] is not able to offer a room today.  CSW Michiel Cowboy [place]) is aware of [facility] bed availability and has been in touch with patient daughter about possible options.  [facility] liaison will follow up with CSW tomorrow or sooner if room becomes available.   Please do not hesitate to call with questions.   Thank you. ?  Gar Ponto, RN [facility] Liaison  [place] are on AMION

## 2018-06-10 NOTE — Progress Notes (Signed)
[facility]: Evaluated pt for GIP level of care at the [facility] home in [facility]. The pt is appropriate for services or hospice care at the SNF or home however does not meet our criteria to transfer to the Hospice home in [place]. He doesn't have any unmanaged symptoms to meet GIP level of care.  We do not have a residential bed to offer only GIP bed today. SW Bubba Hales made aware. Webb Silversmith RN [phone]

## 2018-06-10 NOTE — Progress Notes (Signed)
PROGRESS NOTE  [name] [name]  YSA:[id number] DOB: [birth year] DOA: 06/05/2018 PCP: Biagio Borg, MD   Brief Narrative: [name] [name] is a 84 y.o. male with a history of chronic hypoxic respiratory failure, chronic HFpEF with recent admission 10/7-10/10 for exacerbation and pneumonia, pulmonary HTN, pulmonary artery aneurysm, severe aortic stenosis (not a TAVR candidate), permanent AFib on coumadin, stage III-IV CKD, HTN and BPH who presented from [facility] with dyspnea, hypotension, and bradycardia. In the ED patient was noted to be hypotensive with systolic blood pressures in the60-70s and bradycardic with heart rate in the 30s.Troponin negative, ECG demonstrating AFib with slow ventricular response, nonspecific ST-T changes. No nidus of infection or evidence of sepsis found on work up. Given a 500 cc bolus of normal saline and atropine without improvement. Dopamine was started with HR improvement but continued hypotension. Critical care and cardiology were consulted for admission, though they recommended hospice placement after discussion with patient and daughter who decline any aggressive measures  Daughter and patient confirm that he is DNR/DNI and would not want any aggressive interventions including pressors, central line placement. Hospitalists were consulted for admission for comfort measures and to facilitate admission to hospice. Fortunately the patient's clinical status improved significantly and he is stable for discharge to home vs. nursing home. He was not progressing with PT/OT there previously, so not felt to be SNF-appropriate. After discussions with social worker, his daughter is planning on taking him home with hospice services.  Assessment & Plan: Principal Problem:   Admission for palliative care Active Problems:   Pulmonary hypertension (HCC)   Aortic valve disorder   Persistent atrial fibrillation   Congestive heart failure (HCC)   CKD (chronic kidney disease)  stage 3, GFR 30-59 ml/min (HCC)   Hypotension   Bradycardia   Encounter for palliative care   Palliative care encounter  Hypotension, bradycardia, acute on chronic/end stage diastolic CHF, and aortic stenosis, pulmonary HTN with pulmonary artery aneurysm: Decompensation noted without precipitant, suspect progression of end-stage processes as above. Not a candidate for advanced therapies per cardiology.  - Pt was admitted for palliative care interventions, though his symptoms are very mild and are not requiring aggressive titration.  - Continue supplemental oxygen, dilaudid or oxycodone prn dyspnea/pain, zofran prn nausea. - Restarted home medications for HR, volume. Becoming more bradycardic, so will decrease metoprolol dose today and monitor. Continue verapamil. - Given GOC, stopped coumadin and will not check labs.  AKI:  - Holding ARB with this and hypotension  Gout:  - Continue allopurinol  Glaucoma: Stable, chronic.  - Continue home gtt's.  Code Status: DNR Disposition Plan: No longer believe he requires residential hospice placement. Have discussed at length with CSW and pt's daughter (see notes). Plan is for daughter to arrange home assistance and discharge home with hospice 11/5.   Consultants:   Cardiology  PCCM  Palliative care  Procedures:   None  Antimicrobials:  None   Subjective: Breathing has improved, swelling less in legs. No chest pain, palpitations, dyspnea.   Objective: BP (!) 84/55 (BP Location: Right Arm)   Pulse (!) 53   Temp 98.4 F (36.9 C) (Oral)   Resp 17   Ht 5\' 7"  (1.702 m)   Wt 85.6 kg   SpO2 98%   BMI 29.56 kg/m   Gen: Pleasant, elderly male in no distress Pulm: Nonlabored with supplemental oxygen, no crackles or wheezes. CV: Slow irreg irreg. + harsh systolic murmur, rub, or gallop. No JVD, trace (improved) dependent  edema. GI: Abdomen soft, non-tender, non-distended, with normoactive bowel sounds.  Ext: Warm, no  deformities Skin: No rashes, lesions or ulcers on visualized skin.  Neuro: Alert and oriented. No focal neurological deficits. Psych: Judgement and insight appear fair. Mood euthymic & affect congruent. Behavior is appropriate.    Time spent: 25 minutes.  Patrecia Pour, MD [facility] [email or web address] Password [email or web address] 06/10/2018, 4:50 PM

## 2018-06-10 NOTE — Social Work (Addendum)
CSW spoke with pt daughter [name] [name], she is meeting [facility] liaison today at 11:30, will follow for confirmation of bed availability.   12:39pm- CSW spoke with pt at bedside and pt daughter. Pt still requesting to return home, which pt daughter is unable to do. Discussed that pt would need supportive care and therapies evals if he was to return to SNF under his insurance. Pt would have copayments and could have palliative following. If pt wanted to return to SNF without therapies family would have to private pay for placement, room and board. Pt daughter and pt amenable to therapy evaluations here and return to [facility]. CSW will f/u with [facility].  Westley Hummer, MSW, [place] [employer] [phone]

## 2018-06-10 NOTE — NC FL2 (Addendum)
[place] LEVEL OF CARE SCREENING TOOL     IDENTIFICATION  Patient Name: [name] [name] Birthdate: 1926/03/15 Sex: male Admission Date (Current Location): 06/05/2018  [facility] and [place] Number:  Herbalist and Address:  [facility] [place], [place] [address] [place], [place] [address]      Provider Number: [id number]  Attending Physician Name and Address:  Patrecia Pour, MD  Relative Name and Phone Number:  [name], daughter, [phone]  Current Level of Care: Hospital Recommended Level of Care: [place] Prior Approval Number:    Date Approved/Denied:   PASRR Number: [id number]  Discharge Plan: SNF    Current Diagnoses: Patient Active Problem List   Diagnosis Date Noted  . Palliative care encounter   . Encounter for palliative care 06/07/2018  . Hypotension 06/05/2018  . Bradycardia 06/05/2018  . Admission for palliative care 06/05/2018  . Acute on chronic respiratory failure with hypoxia ([place]) 05/14/2018  . CHF (congestive heart failure) ([place]) 05/13/2018  . Gait disorder 05/06/2018  . BPH (benign prostatic hyperplasia) 05/06/2018  . Hyperglycemia 10/02/2017  . CKD (chronic kidney disease) stage 3, GFR 30-59 ml/min (HCC) 10/02/2017  . Medicare annual wellness visit, subsequent 11/10/2016  . Routine adult health maintenance 11/10/2016  . Dyspnea 11/16/2015  . Lipoma of neck 06/15/2015  . Back pain 06/15/2015  . Lightheadedness 03/04/2015  . Glaucoma 01/14/2013  . Long term current use of anticoagulant 09/26/2010  . CHRONIC CORONARY ISCHEMIA 02/01/2010  . GYNECOMASTIA 11/15/2009  . DIZZINESS 07/13/2009  . Pulmonary hypertension ([place]) 09/25/2007  . GOUT 07/25/2007  . Aneurysm of pulmonary artery ([place]) 07/25/2007  . Aortic valve disorder 07/25/2007  . Persistent atrial fibrillation 07/25/2007  . HEMORRHOIDS 07/25/2007  . BENIGN PROSTATIC HYPERTROPHY, HX OF 07/25/2007  . INGUINAL HERNIA 07/24/2007  .  HYPERCHOLESTEROLEMIA 06/01/2007  . Essential hypertension 06/01/2007  . Congestive heart failure ([place]) 06/01/2007  . CVA 06/01/2007  . Disorder resulting from impaired renal function 06/01/2007  . Osteoarthritis 06/01/2007    Orientation RESPIRATION BLADDER Height & Weight     Self, Time, Place, Situation  O2(5L nasal canula) Continent Weight: 188 lb 11.4 oz (85.6 kg) Height:  5\' 7"  (170.2 cm)  BEHAVIORAL SYMPTOMS/MOOD NEUROLOGICAL BOWEL NUTRITION STATUS      Continent Diet(see discharge summary)  AMBULATORY STATUS COMMUNICATION OF NEEDS Skin    Extensive Assist Verbally Normal(dry and flaky)                       Personal Care Assistance Level of Assistance    Bathing Assistance Feeding Assistance Dressing Assistance  Bathing Assistance: Extensive Assistance  Feeding Assistance: Limited Assistance  Dressing Assistance: Extensive Assistance      Functional Limitations Info  Sight, Hearing Sight Info: Impaired(glaucoma) Hearing Info: Adequate Speech Info: Adequate    SPECIAL CARE FACTORS FREQUENCY  PT (By licensed PT), OT (By licensed OT)     PT Frequency: 5x week OT Frequency: 5x week            Contractures Contractures Info: Not present    Additional Factors Info  Code Status, Allergies Code Status Info: DNR Allergies Info: Ibprofuen           Current Medications (06/10/2018):  This is the current hospital active medication list Current Facility-Administered Medications  Medication Dose Route Frequency Provider Last Rate Last Dose  . acetaminophen (TYLENOL) tablet 650 mg  650 mg Oral Q6H PRN Lenore Cordia, MD  650 mg at 06/10/18 0313   Or  . acetaminophen (TYLENOL) suppository 650 mg  650 mg Rectal Q6H PRN Zada Finders R, MD      . allopurinol (ZYLOPRIM) tablet 100 mg  100 mg Oral Daily Patrecia Pour, MD   100 mg at 06/10/18 0848  . alum & mag hydroxide-simeth (MAALOX/MYLANTA) 200-200-20 MG/5ML suspension 30 mL  30 mL Oral Q6H PRN Zada Finders  R, MD      . antiseptic oral rinse (BIOTENE) solution 15 mL  15 mL Topical PRN Patel, Vishal R, MD      . brinzolamide (AZOPT) 1 % ophthalmic suspension 1 drop  1 drop Both Eyes BID Patrecia Pour, MD   1 drop at 06/09/18 2313  . feeding supplement (ENSURE ENLIVE) (ENSURE ENLIVE) liquid 237 mL  237 mL Oral BID BM Reyne Dumas, MD   237 mL at 06/09/18 1601  . HYDROmorphone (DILAUDID) injection 0.5 mg  0.5 mg Intravenous Q2H PRN Zada Finders R, MD      . latanoprost (XALATAN) 0.005 % ophthalmic solution 1 drop  1 drop Both Eyes QHS Patrecia Pour, MD   1 drop at 06/09/18 2311  . magic mouthwash  15 mL Oral Q6H PRN Zada Finders R, MD      . metoprolol succinate (TOPROL-XL) 24 hr tablet 25 mg  25 mg Oral Daily Patrecia Pour, MD   Stopped at 06/10/18 1217  . nystatin (MYCOSTATIN/NYSTOP) topical powder   Topical TID PRN Lenore Cordia, MD      . ondansetron (ZOFRAN-ODT) disintegrating tablet 4 mg  4 mg Oral Q6H PRN Lenore Cordia, MD       Or  . ondansetron (ZOFRAN) injection 4 mg  4 mg Intravenous Q6H PRN Zada Finders R, MD      . oxyCODONE (Oxy IR/ROXICODONE) immediate release tablet 5 mg  5 mg Oral Q4H PRN Lenore Cordia, MD   5 mg at 06/10/18 0319  . pantoprazole (PROTONIX) EC tablet 40 mg  40 mg Oral Daily PRN Patrecia Pour, MD      . pilocarpine (PILOCAR) 4 % ophthalmic solution 1 drop  1 drop Left Eye BID Patrecia Pour, MD   1 drop at 06/09/18 2303  . polyvinyl alcohol (LIQUIFILM TEARS) 1.4 % ophthalmic solution 1 drop  1 drop Both Eyes QID PRN Lenore Cordia, MD   1 drop at 06/08/18 1101  . potassium chloride SA (K-DUR,KLOR-CON) CR tablet 20 mEq  20 mEq Oral BID Patrecia Pour, MD   20 mEq at 06/10/18 0848  . senna (SENOKOT) tablet 8.6 mg  1 tablet Oral QHS PRN Zada Finders R, MD      . tamsulosin (FLOMAX) capsule 0.4 mg  0.4 mg Oral QHS Patrecia Pour, MD   0.4 mg at 06/09/18 2304  . torsemide (DEMADEX) tablet 40 mg  40 mg Oral q1800 Patrecia Pour, MD   40 mg at 06/09/18 1725  . torsemide  (DEMADEX) tablet 80 mg  80 mg Oral QAC breakfast Patrecia Pour, MD   80 mg at 06/09/18 0744  . verapamil (CALAN-SR) CR tablet 120 mg  120 mg Oral QHS Patrecia Pour, MD   120 mg at 06/09/18 2305     Discharge Medications: Please see discharge summary for a list of discharge medications.  Relevant Imaging Results:  Relevant Lab Results:   Additional Information SS#: [id number]  Alexander Mt, LCSWA

## 2018-06-10 NOTE — Social Work (Addendum)
Spoke with [employer]. According to director of nursing at facility they are unable to skill pt under his [facility] Medicare should he return to [facility]. This means pt daughter could private pay for pt to return to [facility] to cover room and board with hospice services; or pt could discharge home with home hospice services.   [place] will follow up with daughter to let her know about this development.   4:15pm- CSW spoke with pt daughter [name], she had just spoken with [facility] and understands that pt does not meet Skilled Nursing requirements. We further discussed that this is due to pt not requiring much more than assistance with bathing, dressing, and ambulating. Pt meets more custodial care which is note covered by Medicare coverage. Pt was already also in copay days and [facility] Medicare had stopped covering therapy at SNF. Pt appropriate for return home with private aides and hospice services (this is pt's preference) Pt could also return to SNF under private pay for room and board as well as hospice services.   Pt daughter aware of both of these choices, pt daughter recognizes that both of these choices are not financially easy for her to manage but as pt is currently listed under observation and we are not currently managing any additional needs that we would need for decision between these two options to be made- pt daughter states understanding, acknowledges that pt will likely return home with private aides and hospice services. RN Case Manager and MD aware.   Westley Hummer, MSW, [place] [facility] [phone]

## 2018-06-11 ENCOUNTER — Telehealth: Payer: Self-pay | Admitting: Internal Medicine

## 2018-06-11 DIAGNOSIS — N183 Chronic kidney disease, stage 3 (moderate): Secondary | ICD-10-CM | POA: Diagnosis not present

## 2018-06-11 DIAGNOSIS — Z515 Encounter for palliative care: Secondary | ICD-10-CM | POA: Diagnosis not present

## 2018-06-11 DIAGNOSIS — I359 Nonrheumatic aortic valve disorder, unspecified: Secondary | ICD-10-CM | POA: Diagnosis not present

## 2018-06-11 DIAGNOSIS — R001 Bradycardia, unspecified: Secondary | ICD-10-CM | POA: Diagnosis not present

## 2018-06-11 MED ORDER — METOPROLOL SUCCINATE ER 25 MG PO TB24: 25.0000 mg | ORAL_TABLET | Freq: Every day | ORAL | 0 refills | Status: DC

## 2018-06-11 MED ORDER — WARFARIN SODIUM 3 MG PO TABS: 3.0000 mg | ORAL_TABLET | Freq: Every day | ORAL | 0 refills | Status: DC

## 2018-06-11 MED ORDER — POTASSIUM CHLORIDE CRYS ER 20 MEQ PO TBCR: 20.0000 meq | EXTENDED_RELEASE_TABLET | Freq: Two times a day (BID) | ORAL | 0 refills | Status: DC

## 2018-06-11 MED ORDER — VERAPAMIL HCL ER 120 MG PO TBCR: 120.0000 mg | EXTENDED_RELEASE_TABLET | Freq: Every day | ORAL | 0 refills | Status: AC

## 2018-06-11 MED ORDER — TAMSULOSIN HCL 0.4 MG PO CAPS: 0.4000 mg | ORAL_CAPSULE | Freq: Every day | ORAL | 0 refills | Status: AC

## 2018-06-11 MED ORDER — OMEPRAZOLE 20 MG PO CPDR: 20.0000 mg | DELAYED_RELEASE_CAPSULE | Freq: Every day | ORAL | 0 refills | Status: AC | PRN

## 2018-06-11 MED ORDER — TORSEMIDE 20 MG PO TABS: ORAL_TABLET | ORAL | 0 refills | Status: AC

## 2018-06-11 MED ORDER — BRINZOLAMIDE 1 % OP SUSP: 1.0000 [drp] | Freq: Two times a day (BID) | OPHTHALMIC | 0 refills | Status: AC

## 2018-06-11 MED ORDER — BIMATOPROST 0.01 % OP SOLN: 1.0000 [drp] | Freq: Every day | OPHTHALMIC | 0 refills | Status: AC

## 2018-06-11 MED ORDER — PILOCARPINE HCL 4 % OP SOLN: 1.0000 [drp] | Freq: Two times a day (BID) | OPHTHALMIC | 0 refills | Status: AC

## 2018-06-11 MED ORDER — ALLOPURINOL 100 MG PO TABS: 100.0000 mg | ORAL_TABLET | Freq: Every day | ORAL | 0 refills | Status: AC

## 2018-06-11 MED ORDER — WARFARIN SODIUM 1 MG PO TABS: 0.5000 mg | ORAL_TABLET | Freq: Every day | ORAL | 0 refills | Status: DC

## 2018-06-11 NOTE — Progress Notes (Signed)
OT Cancellation Note  Patient Details Name: [name] [name] MRN: [id number] DOB: [birth year]   Cancelled Treatment:    Reason Eval/Treat Not Completed: OT screened, no needs identified, will sign off(pending hospice services) Pt with pending [facility] transport and CM note states "on their way".   Jeri Modena, OTR/L  Acute Rehabilitation Services Pager: [phone] Office: [phone] .   Parke Poisson B 06/11/2018, 11:26 AM

## 2018-06-11 NOTE — Discharge Summary (Signed)
Physician Discharge Summary  [name] [name] ZOX:[id number] DOB: [birth year] DOA: 06/05/2018  PCP: Biagio Borg, MD  Admit date: 06/05/2018 Discharge date: 06/11/2018  Admitted From: SNF Disposition: Home with hospice   Recommendations for Outpatient Follow-up:  1. Follow up with home hospice  Home Health: Hospice Equipment/Devices: Per Hospice, continue supplemental oxygen Discharge Condition: Stable for discharge, guarded prognosis CODE STATUS: DNR Diet recommendation: Heart healthy as tolerated  Brief/Interim Summary: [name] [name] is a 84 y.o. male with a history of chronic hypoxic respiratory failure, chronic HFpEF with recent admission 10/7-10/10 for exacerbation and pneumonia, pulmonary HTN, pulmonary artery aneurysm, severe aortic stenosis (not a TAVR candidate), permanent AFib on coumadin, stage III-IV CKD, HTN and BPH who presented from [facility] with dyspnea, hypotension, and bradycardia. In the ED patient was noted to be hypotensive with systolic blood pressures in the60-70s and bradycardic with heart rate in the 30s.Troponin negative, ECG demonstrating AFib with slow ventricular response, nonspecific ST-T changes. No nidus of infection or evidence of sepsis found on work up. Given a 500 cc bolus of normal saline and atropine without improvement. Dopamine was started with HR improvement but continued hypotension. Critical care and cardiology were consulted for admission, though they recommended hospice placement after discussion with patient and daughter who decline any aggressive measures  Daughter and patient confirm that he is DNR/DNI and would not want any aggressive interventions including pressors, central line placement. Hospitalists were consulted for admission for comfort measures and to facilitate admission to hospice. Fortunately the patient's clinical status improved significantly and he is stable for discharge to home vs. nursing home. He was not progressing with  PT/OT there previously, so not felt to be SNF-appropriate. After discussions with social worker, his daughter is planning on taking him home with hospice services.  Discharge Diagnoses:  Principal Problem:   Admission for palliative care Active Problems:   Pulmonary hypertension (HCC)   Aortic valve disorder   Persistent atrial fibrillation   Congestive heart failure (HCC)   CKD (chronic kidney disease) stage 3, GFR 30-59 ml/min (HCC)   Hypotension   Bradycardia   Encounter for palliative care   Palliative care encounter  Hypotension, bradycardia, acute on chronic/end stage diastolic CHF, aortic stenosis, pulmonary HTN with pulmonary artery aneurysm: Decompensation noted without precipitant, suspect progression of end-stage processes as above. Not a candidate for advanced therapies per cardiology. Fortunately he has stabilized and will be able to go home per his wishes with hospice following. - Pt was admitted for palliative care interventions, though his symptoms are very mild and are not requiring aggressive titration. Declined by residential hospice. - Continue supplemental oxygen - Restarted home medications for HR, volume, as below. Becoming more bradycardic, so will decrease metoprolol dose. Continue verapamil. - Patient wishes to continue coumadin, so will restart at previous dosing.  AKI:  - Holding ARB with this and hypotension  Gout:  - Continue allopurinol  Glaucoma: Stable, chronic.  - Continue home gtt's.  Discharge Instructions Discharge Instructions    Diet - low sodium heart healthy   Complete by:  As directed    Discharge instructions   Complete by:  As directed    Take medications as directed below.   Mycardis was stopped due to low blood pressure Metoprolol was changed to a lower dose due to low heart rate and low blood pressure Lipitor and calcifediol were stopped because they are not likely to help you feel more comfortable or positively impact your  chronic diseases.  New prescriptions were printed and provided in case you need them for your other medications.  You will be followed closely at home by hospice and should contact them with any issues that arise after you leave the hospital.   Increase activity slowly   Complete by:  As directed      Allergies as of 06/11/2018      Reactions   Ibuprofen    REACTION: causes low blood pressure      Medication List    STOP taking these medications   atorvastatin 20 MG tablet Commonly known as:  LIPITOR   multivitamin tablet   RAYALDEE 30 MCG Cpcr Generic drug:  Calcifediol ER   telmisartan 80 MG tablet Commonly known as:  MICARDIS     TAKE these medications   allopurinol 100 MG tablet Commonly known as:  ZYLOPRIM Take 1 tablet (100 mg total) by mouth daily.   bimatoprost 0.01 % Soln Commonly known as:  LUMIGAN Place 1 drop into both eyes at bedtime.   brinzolamide 1 % ophthalmic suspension Commonly known as:  AZOPT Place 1 drop into both eyes 2 (two) times daily.   metoprolol succinate 25 MG 24 hr tablet Commonly known as:  TOPROL-XL Take 1 tablet (25 mg total) by mouth daily. Take with or immediately following a meal. What changed:    medication strength  See the new instructions.   omeprazole 20 MG capsule Commonly known as:  PRILOSEC Take 1 capsule (20 mg total) by mouth daily as needed (reflux). What changed:  See the new instructions.   pilocarpine 4 % ophthalmic solution Commonly known as:  PILOCAR Place 1 drop into the left eye 2 (two) times daily.   potassium chloride SA 20 MEQ tablet Commonly known as:  K-DUR,KLOR-CON Take 1 tablet (20 mEq total) by mouth 2 (two) times daily.   tamsulosin 0.4 MG Caps capsule Commonly known as:  FLOMAX Take 1 capsule (0.4 mg total) by mouth at bedtime.   torsemide 20 MG tablet Commonly known as:  DEMADEX Take 4 tablets (80 mg total) by mouth every morning AND 2 tablets (40 mg total) every evening. What  changed:    See the new instructions.  Another medication with the same name was removed. Continue taking this medication, and follow the directions you see here.   verapamil 120 MG CR tablet Commonly known as:  CALAN-SR Take 1 tablet (120 mg total) by mouth at bedtime. What changed:  See the new instructions.   warfarin 3 MG tablet Commonly known as:  COUMADIN Take as directed. If you are unsure how to take this medication, talk to your nurse or doctor. Original instructions:  Take 1 tablet (3 mg total) by mouth daily. Take with 0.5mg  to equal 3.5mg . What changed:  Another medication with the same name was removed. Continue taking this medication, and follow the directions you see here.   warfarin 1 MG tablet Commonly known as:  COUMADIN Take as directed. If you are unsure how to take this medication, talk to your nurse or doctor. Original instructions:  Take 0.5 tablets (0.5 mg total) by mouth daily. Take with 3mg  to equal 3.5mg  What changed:  Another medication with the same name was removed. Continue taking this medication, and follow the directions you see here.      Follow-up Information    Biagio Borg, MD Follow up.   Specialties:  Internal Medicine, Radiology Contact information: [place] [address] [phone]  Allergies  Allergen Reactions  . Ibuprofen     REACTION: causes low blood pressure    Consultations:  Cardiology  Palliative care medicine team  Procedures/Studies: Dg Chest 2 View  Result Date: 05/13/2018 CLINICAL DATA:  Shortness of Breath EXAM: CHEST - 2 VIEW COMPARISON:  05/09/2018 FINDINGS: Cardiac shadow is stable. Elevation of the left hemidiaphragm is again seen. Aortic calcifications are again noted. Bibasilar atelectasis are again identified slightly increased on the right when compared with the prior exam. No new focal infiltrate or effusion is seen. Degenerative changes of the thoracic spine are noted.  IMPRESSION: Slight increase in the degree of right basilar atelectasis. Electronically Signed   By: Inez Catalina M.D.   On: 05/13/2018 16:16   Dg Chest Portable 1 View  Result Date: 06/05/2018 CLINICAL DATA:  Chest pain EXAM: PORTABLE CHEST 1 VIEW COMPARISON:  05/13/2018 FINDINGS: Cardiomegaly with moderate aortic atherosclerosis. Low lung volumes with mild diffuse interstitial edema and atelectasis at each lung base. External defibrillator paddle partial obscures the left lung base. Probable small bilateral pleural effusions blunting the costophrenic angles. IMPRESSION: Stable cardiomegaly with moderate aortic atherosclerosis. Bibasilar atelectasis with probable small bilateral pleural effusions. Electronically Signed   By: Ashley Royalty M.D.   On: 06/05/2018 15:25    Subjective: No complaints. Leg swelling has improved and breathing is back to baseline. Wants to go home. Has no chest pain or palpitations. Wants to go home.  Discharge Exam: Vitals:   06/09/18 2252 06/10/18 0517  BP: 107/79 (!) 84/55  Pulse: 85 (!) 53  Resp: 18 17  Temp: 97.8 F (36.6 C) 98.4 F (36.9 C)  SpO2: 98% 98%   General: Pt is alert, awake, not in acute distress Cardiovascular: Irreg irreg, +harsh systolic murmur greatest at USB. S1/S2 +, no rubs, no gallops Respiratory: Nonlabored with high flow supplemental oxygen, no crackles or wheezes. Abdominal: Soft, NT, ND, bowel sounds + Extremities: 1+ pitting LE edema, no cyanosis  Labs: BNP (last 3 results) Recent Labs    05/13/18 1651 06/05/18 1634  BNP 974.3* 361.4*   Basic Metabolic Panel: Recent Labs  Lab 06/05/18 1439 06/05/18 1525  NA 139 141  K 4.5 4.4  CL 106 104  CO2 24  --   GLUCOSE 142* 140*  BUN 59* 61*  CREATININE 1.97* 2.20*  CALCIUM 8.1*  --    Liver Function Tests: Recent Labs  Lab 06/05/18 1624  AST 23  ALT 16  ALKPHOS 129*  BILITOT 0.7  PROT 6.4*  ALBUMIN 3.2*   No results for input(s): LIPASE, AMYLASE in the last 168  hours. No results for input(s): AMMONIA in the last 168 hours. CBC: Recent Labs  Lab 06/05/18 1439 06/05/18 1525  WBC 5.9  --   HGB 10.7* 12.2*  HCT 36.2* 36.0*  MCV 102.5*  --   PLT 195  --    Cardiac Enzymes: Recent Labs  Lab 06/05/18 1718  TROPONINI 0.03*   BNP: Invalid input(s): POCBNP CBG: Recent Labs  Lab 06/05/18 1511  GLUCAP 128*   D-Dimer No results for input(s): DDIMER in the last 72 hours. Hgb A1c No results for input(s): HGBA1C in the last 72 hours. Lipid Profile No results for input(s): CHOL, HDL, LDLCALC, TRIG, CHOLHDL, LDLDIRECT in the last 72 hours. Thyroid function studies No results for input(s): TSH, T4TOTAL, T3FREE, THYROIDAB in the last 72 hours.  Invalid input(s): FREET3 Anemia work up No results for input(s): VITAMINB12, FOLATE, FERRITIN, TIBC, IRON, RETICCTPCT in the last 36  hours. Urinalysis    Component Value Date/Time   COLORURINE YELLOW 05/14/2018 [place] 05/14/2018 0251   LABSPEC 1.006 05/14/2018 0251   PHURINE 6.0 05/14/2018 0251   GLUCOSEU NEGATIVE 05/14/2018 0251   GLUCOSEU NEGATIVE 08/21/2013 1700   HGBUR NEGATIVE 05/14/2018 0251   BILIRUBINUR NEGATIVE 05/14/2018 0251   KETONESUR NEGATIVE 05/14/2018 0251   PROTEINUR NEGATIVE 05/14/2018 0251   UROBILINOGEN 0.2 08/21/2013 1700   NITRITE NEGATIVE 05/14/2018 0251   LEUKOCYTESUR NEGATIVE 05/14/2018 0251    Microbiology Recent Results (from the past 240 hour(s))  Blood Culture (routine x 2)     Status: None   Collection Time: 06/05/18  4:20 PM  Result Value Ref Range Status   Specimen Description BLOOD LEFT HAND  Final   Special Requests   Final    BOTTLES DRAWN AEROBIC AND ANAEROBIC Blood Culture adequate volume   Culture   Final    NO GROWTH 5 DAYS Performed at [facility], [place] [address] [place], [place] [address]    Report Status 06/10/2018 FINAL  Final  Blood Culture (routine x 2)     Status: None   Collection Time: 06/05/18  4:35 PM   Result Value Ref Range Status   Specimen Description BLOOD RIGHT HAND  Final   Special Requests   Final    BOTTLES DRAWN AEROBIC AND ANAEROBIC Blood Culture adequate volume   Culture   Final    NO GROWTH 5 DAYS Performed at [facility], [place] [address], [place], [place] [address]    Report Status 06/10/2018 FINAL  Final    Time coordinating discharge: Approximately 40 minutes  Patrecia Pour, MD  [facility] 06/11/2018, 9:35 AM Pager [phone]

## 2018-06-11 NOTE — Care Management Note (Addendum)
Case Management Note  Patient Details  Name: [name] [name] MRN: [id number] Date of Birth: 1925-12-31  Subjective/Objective:                    Action/Plan:  Discussed discharge planning with patient and daughter [name] [phone] via phone. See social work's notes.  Plan is to discharge today with Hospice and [place]. Confirmed face sheet information with patient and daughter.   Patient has home oxygen through [facility]. Patient has Rolator.   Patient lives alone, however, daughter [name] will assist patient in finding sitters to provide 84 / 7 care. Patient in agreement to private pay.   Plan patient will discharge via PTAR. [name] wants to be called when PTAR arrives to pick up Mr Biehn so she can meet him at his home.   Referral given to [facility] and [place] with [facility]. Anderson Malta has accepted referral and since patient needs no other DME at this time, he can be discharged.   Paged MD to sign DNR form.  PTAR paperwork on chart. Expected Discharge Date:  06/11/18               Expected Discharge Plan:  Home w Hospice Care  In-House Referral:  Clinical Social Work, Hospice / Palliative Care, Nutrition  Discharge planning Services  CM Consult  Post Acute Care Choice:  Durable Medical Equipment, Hospice Choice offered to:  Patient, Adult Children  DME Arranged:  N/A DME Agency:  NA  HH Arranged:    [place] Agency:  [facility]  Status of Service:  In process, will continue to follow  If discussed at Long Length of Stay Meetings, dates discussed:    Additional Comments:  Marilu Favre, RN 06/11/2018, 10:12 AM

## 2018-06-11 NOTE — Progress Notes (Signed)
PT Cancellation Note  Patient Details Name: [name] [name] MRN: [id number] DOB: [birth year]   Cancelled Treatment:    Reason Eval/Treat Not Completed: (P) Other (comment) Pt is awaiting clarification of Hospice placement. Will check back with CSW to appropriateness of Evaluation this afternoon.  Jazzlene Huot B. Migdalia Dk PT, DPT Acute Rehabilitation Services Pager [phone] Office [phone]    [place]iment 06/11/2018, 8:50 AM

## 2018-06-11 NOTE — Progress Notes (Addendum)
[facility] ([facility])    Notified by Paulita Cradle, of family request for [facility] services at home after discharge.  Chart and patient information under review at this time and eligibility has been confirmed.  Spoke with patient, confirmed interest, answered questions and provided information regarding hospice services and philosophy.  No questions were unanswered.     Plan is to discharge later today via PTAR.     Please send signed and completed DNR home with the patient.   Patient will need prescriptions for discharge comfort medications (if necessary).   DME needs discussed, patient currently has the following equipment in the home:  Rollator, oxygen through [facility] (originally at 2.5 lpm, may d/c higher).  Pt does not need any equipment to go home today.   [facility] Referral Center is aware of the above information.  Completed discharge summary should be faxed to [phone] once completed.     Please notify [facility] at [phone] (if after 5 call [phone]) when the pt is ready to leave the unit.    [facility] information given to patient and daughter [name].   Above information shared with Nira Conn, [facility]   Thank you for this referral,  Venia Carbon BSN, RN [facility] Liaison (listed in [place]) [phone]?

## 2018-06-11 NOTE — Care Management (Signed)
Gold DNR form signed. [facility] paperwork in shadow chart. Patient ready for discharge. [facility] called and are " on their way". Bedside nurse, patient and daughter [name] ( at bedside aware).  Magdalen Spatz RN BSN [phone]

## 2018-06-11 NOTE — Telephone Encounter (Signed)
Left msg with Lisabeth Pick, hospice, that PCP will be attending. Danise Mina has stepped out of office.

## 2018-06-11 NOTE — Telephone Encounter (Signed)
Copied from [place] [phone]. Topic: Quick Communication - See Telephone Encounter >> Jun 11, 2018 10:37 AM Gardiner Ramus wrote: CRM for notification. See Telephone encounter for: 06/11/18. Jenn calling from [facility] called and would like to know if dr Jenny Reichmann would be the attending of record. Please advise

## 2018-06-12 ENCOUNTER — Ambulatory Visit: Payer: Self-pay | Admitting: Pharmacist

## 2018-06-12 DIAGNOSIS — I4819 Other persistent atrial fibrillation: Secondary | ICD-10-CM

## 2018-06-12 DIAGNOSIS — Z7901 Long term (current) use of anticoagulants: Secondary | ICD-10-CM

## 2018-06-13 ENCOUNTER — Ambulatory Visit: Payer: Self-pay | Admitting: Pharmacist Clinician (PhC)/ Clinical Pharmacy Specialist

## 2018-06-13 DIAGNOSIS — I4819 Other persistent atrial fibrillation: Secondary | ICD-10-CM

## 2018-06-13 DIAGNOSIS — Z7901 Long term (current) use of anticoagulants: Secondary | ICD-10-CM

## 2018-06-23 ENCOUNTER — Other Ambulatory Visit: Payer: Self-pay | Admitting: Cardiology

## 2018-06-26 ENCOUNTER — Other Ambulatory Visit: Payer: Self-pay | Admitting: Cardiology

## 2018-06-27 ENCOUNTER — Ambulatory Visit: Payer: Medicare Other | Admitting: Cardiology

## 2018-06-28 ENCOUNTER — Telehealth: Payer: Self-pay | Admitting: Internal Medicine

## 2018-06-28 NOTE — Telephone Encounter (Signed)
Copied from [place] [phone]. Topic: Quick Communication - See Telephone Encounter >> Jun 28, 2018  2:02 PM Blase Mess A wrote: CRM for notification. See Telephone encounter for: 06/28/18.  Geradine Girt from [facility] is calling regards Plan of Care orders 06/12/18-09/09/18. Also requesting CTI returned back  Please advise Geradine Girt [phone]

## 2018-07-09 ENCOUNTER — Telehealth: Payer: Self-pay

## 2018-07-09 NOTE — Telephone Encounter (Signed)
Called [name] [name], LVM, asked her to fax forms over again.  Copied from [place] [phone]. Topic: Quick Communication - See Telephone Encounter >> Jun 28, 2018  2:02 PM Blase Mess A wrote: CRM for notification. See Telephone encounter for: 06/28/18.  Geradine Girt from [facility] is calling regards Plan of Care orders 06/12/18-09/09/18. Also requesting CTI returned back  Please advise Geradine Girt [phone] >> Jul 08, 2018  3:08 PM Conception Chancy, NT wrote: Lockie Mola is calling back. [phone]

## 2018-08-08 ENCOUNTER — Ambulatory Visit: Payer: Medicare Other | Admitting: Internal Medicine

## 2018-08-12 ENCOUNTER — Ambulatory Visit: Payer: Medicare Other | Admitting: Cardiology

## 2018-08-13 ENCOUNTER — Ambulatory Visit: Payer: Medicare Other | Admitting: Podiatry

## 2018-08-15 ENCOUNTER — Non-Acute Institutional Stay (SKILLED_NURSING_FACILITY): Payer: Medicare Other | Admitting: Internal Medicine

## 2018-08-15 ENCOUNTER — Encounter: Payer: Self-pay | Admitting: Internal Medicine

## 2018-08-15 DIAGNOSIS — K625 Hemorrhage of anus and rectum: Secondary | ICD-10-CM

## 2018-08-15 NOTE — Patient Instructions (Signed)
See assessment and plan for this acute visit

## 2018-08-15 NOTE — Progress Notes (Signed)
NURSING HOME LOCATION:  [facility] ROOM NUMBER:  [id number]  CODE STATUS:  DNR  PCP:  Biagio Borg, MD  [place] [id number]  This is an acute visit performed @  [facility]. HPI: This Hospice patient was admitted to the SNF for a respite stay.  After admission the patient was noted to have active rectal bleeding with clots.  He previously been on warfarin but this had been discontinued at least since last October according to [facility] records which were reviewed.  The last PT/INR was 1.83 on 06/05/2018.  He was on full dose aspirin which was discontinued upon onset of the rectal bleeding. According to his daughter [name] ([phone]) he had a similar episode approximately month ago which resolved spontaneously.  Both she and the patient deny any other bleeding dyscrasias.  Past medical and surgical history: He does have a history of hemorrhoids without complication.  He has a history of cerebral artery occlusion with cerebral infarction.  Other medical diagnoses include dyslipidemia, renal insufficiency, pulmonary hypertension, essential hypertension, gout, chronic congestive heart failure, A. fib, aneurysm of the pulmonary artery, and aortic stenosis.  He has history of BPH without a history of prostate cancer.  He is on tamsulosin.  Social history: Nondrinker currently.  He formally smoked cigars and cigarettes.  Family history: Minimal history reviewed.  History is noncontributory due to his age.   Review of systems: His history is questionable as he gave the date as 08/15/1918 and then subsequently 1989.  He gave [facility] as the president's name.  He did know he was born in [birth year].  He has an order for Haldol 2 mg every 4 hours as needed. Specifically he denies epistaxis, hemoptysis, hematuria, abnormal bruising or other abnormal bleeding.  He also denied abdominal pain.  He is on a PPI.  The last hemoglobin hematocrit were 12.2/36 on 06/05/2018.  Physical  exam:  Pertinent or positive findings: He appears his stated age.  He is not in acute distress but agitated,continually asking about his phone.  Hair is disheveled, he has a mustache.  Dense arcus senilis is present.  He has an upper plate and few lower mandibular teeth.  These are coated and in fair-poor repair.  He exhibits a tachyarrhythmia with a flow murmur at the right base.  Breath sounds are decreased.  He has scattered low-grade rales.Bowel sounds are decreased w/o clinical ileus.   There was some suprapubic tenderness.Penile atrophy present.  The scrotum appears swollen.  He was wearing depends which had dark blood.  There was also scattered dribbles of dried blood on the floor.  Pedal pulses are decreased.  Tense edema is noted of the lower extremities.  He has hyperpigmentation and scarring over the shins.  General appearance:  no  increased work of breathing is present.   Lymphatic: No lymphadenopathy about the head, neck, axilla. Eyes: No conjunctival inflammation or lid edema is present. There is no scleral icterus. Ears:  External ear exam shows no significant lesions or deformities.   Nose:  External nasal examination shows no deformity or inflammation. Nasal mucosa are pink and moist without lesions, exudates Oral exam: Lips and gums are healthy appearing. Neck:  No thyromegaly, masses, tenderness noted.    Abdomen:  Abdomen is soft  with no organomegaly, hernias, masses. Extremities:  No cyanosis, clubbing. Skin: Warm & dry w/o tenting. No significant lesions or rash.  See clinical summary under each active problem in the Problem  List with associated updated therapeutic plan

## 2018-08-16 ENCOUNTER — Telehealth: Payer: Self-pay | Admitting: Internal Medicine

## 2018-08-19 ENCOUNTER — Telehealth: Payer: Self-pay | Admitting: Internal Medicine

## 2018-08-19 NOTE — Telephone Encounter (Signed)
08/19/18 I received D/C from [employer] home. Sent by courier to [facility]. Cathlean Cower. I will call [facility] [employer] to pick up when completed @ [phone]. PWR  08/20/2018 I received D/C back from [place]  I called [facility] [employer] at [phone] to pick  Up.PWR

## 2018-08-26 NOTE — Telephone Encounter (Signed)
I have printed demographics and obit to send to HIM to mark chart.

## 2018-09-07 NOTE — Telephone Encounter (Signed)
Copied from [place] [id number]. Topic: General - Other >> 08-25-18 12:39 PM Yvette Rack wrote: Reason for CRM: Helene Kelp with [facility] called to make Dr. Jenny Reichmann aware that patient passed away today 08/25/2018 at 12 pm. Cb# [phone]

## 2018-09-07 DEATH — deceased

## 2020-01-14 IMAGING — DX DG CHEST 1V PORT
1 series · 1 of 1 positions shown · non-contrast
Comparison: 05/13/2018

CLINICAL DATA: Chest pain

EXAM:
PORTABLE CHEST 1 VIEW

[chest ap]
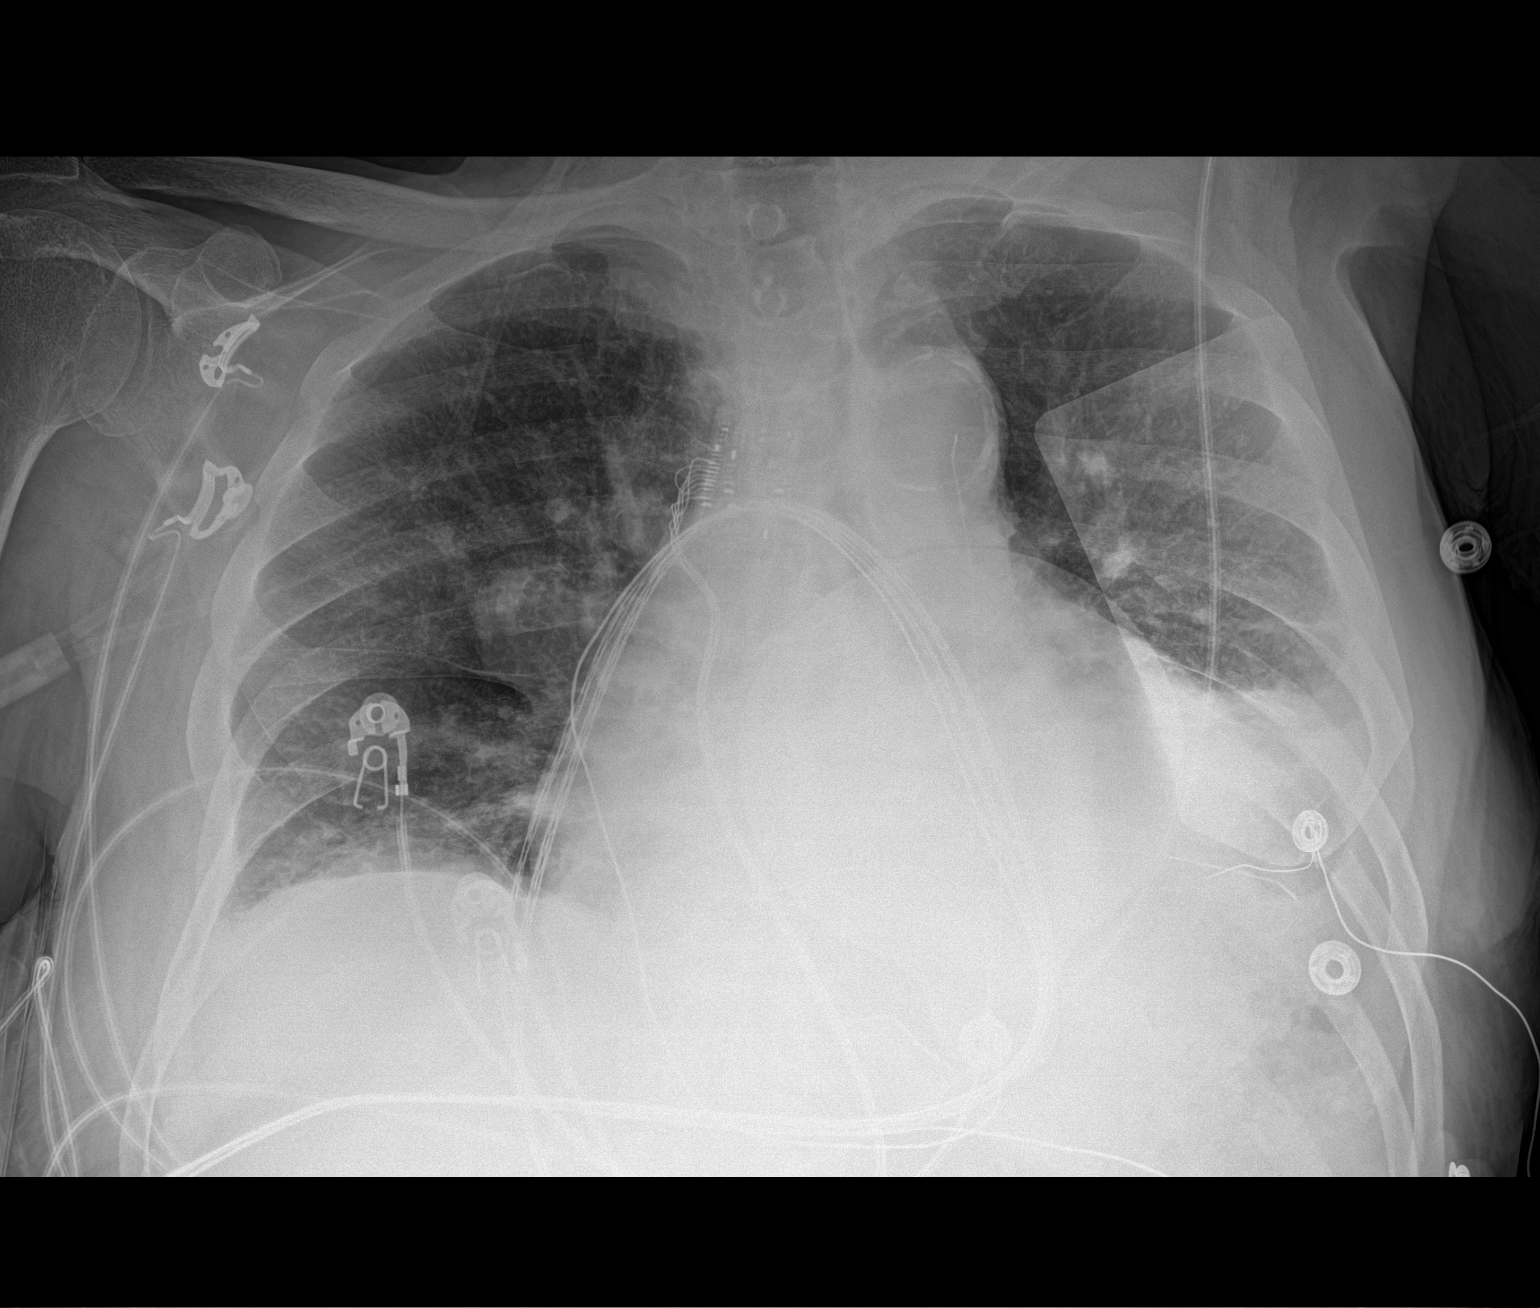

[1 of 1 positions shown; findings below may reference images not displayed]

FINDINGS: Cardiomegaly with moderate aortic atherosclerosis. Low lung volumes
with mild diffuse interstitial edema and atelectasis at each lung
base. External defibrillator paddle partial obscures the left lung
base. Probable small bilateral pleural effusions blunting the
costophrenic angles.
IMPRESSION: Stable cardiomegaly with moderate aortic atherosclerosis. Bibasilar
atelectasis with probable small bilateral pleural effusions.
